# Patient Record
Sex: Female | Born: 1960 | Race: White | Hispanic: No | Marital: Married | Smoking: Former smoker
Health system: Southern US, Community
[De-identification: ages and names within clinical notes are randomized; demographics above are authoritative.]

## PROBLEM LIST (undated history)

## (undated) DIAGNOSIS — S82401A Unspecified fracture of shaft of right fibula, initial encounter for closed fracture: Secondary | ICD-10-CM

## (undated) DIAGNOSIS — R739 Hyperglycemia, unspecified: Secondary | ICD-10-CM

## (undated) DIAGNOSIS — E669 Obesity, unspecified: Secondary | ICD-10-CM

## (undated) DIAGNOSIS — G4730 Sleep apnea, unspecified: Secondary | ICD-10-CM

## (undated) DIAGNOSIS — J302 Other seasonal allergic rhinitis: Secondary | ICD-10-CM

## (undated) DIAGNOSIS — D219 Benign neoplasm of connective and other soft tissue, unspecified: Secondary | ICD-10-CM

## (undated) DIAGNOSIS — M1990 Unspecified osteoarthritis, unspecified site: Secondary | ICD-10-CM

## (undated) DIAGNOSIS — Z8709 Personal history of other diseases of the respiratory system: Secondary | ICD-10-CM

## (undated) DIAGNOSIS — M549 Dorsalgia, unspecified: Secondary | ICD-10-CM

## (undated) DIAGNOSIS — Z124 Encounter for screening for malignant neoplasm of cervix: Secondary | ICD-10-CM

## (undated) DIAGNOSIS — Z22322 Carrier or suspected carrier of Methicillin resistant Staphylococcus aureus: Secondary | ICD-10-CM

## (undated) DIAGNOSIS — M719 Bursopathy, unspecified: Secondary | ICD-10-CM

## (undated) DIAGNOSIS — N840 Polyp of corpus uteri: Secondary | ICD-10-CM

## (undated) DIAGNOSIS — F329 Major depressive disorder, single episode, unspecified: Secondary | ICD-10-CM

## (undated) DIAGNOSIS — M722 Plantar fascial fibromatosis: Secondary | ICD-10-CM

## (undated) DIAGNOSIS — K219 Gastro-esophageal reflux disease without esophagitis: Secondary | ICD-10-CM

## (undated) DIAGNOSIS — M791 Myalgia, unspecified site: Secondary | ICD-10-CM

## (undated) DIAGNOSIS — S62109A Fracture of unspecified carpal bone, unspecified wrist, initial encounter for closed fracture: Secondary | ICD-10-CM

## (undated) DIAGNOSIS — M2550 Pain in unspecified joint: Secondary | ICD-10-CM

## (undated) DIAGNOSIS — G4733 Obstructive sleep apnea (adult) (pediatric): Secondary | ICD-10-CM

## (undated) DIAGNOSIS — N6019 Diffuse cystic mastopathy of unspecified breast: Secondary | ICD-10-CM

## (undated) DIAGNOSIS — F32A Depression, unspecified: Secondary | ICD-10-CM

## (undated) DIAGNOSIS — E559 Vitamin D deficiency, unspecified: Secondary | ICD-10-CM

## (undated) DIAGNOSIS — M810 Age-related osteoporosis without current pathological fracture: Secondary | ICD-10-CM

## (undated) DIAGNOSIS — E7800 Pure hypercholesterolemia, unspecified: Secondary | ICD-10-CM

## (undated) DIAGNOSIS — F419 Anxiety disorder, unspecified: Secondary | ICD-10-CM

## (undated) DIAGNOSIS — I10 Essential (primary) hypertension: Secondary | ICD-10-CM

## (undated) HISTORY — DX: Age-related osteoporosis without current pathological fracture: M81.0

## (undated) HISTORY — DX: Benign neoplasm of connective and other soft tissue, unspecified: D21.9

## (undated) HISTORY — DX: Unspecified osteoarthritis, unspecified site: M19.90

## (undated) HISTORY — PX: FRACTURE SURGERY: SHX138

## (undated) HISTORY — DX: Personal history of other diseases of the respiratory system: Z87.09

## (undated) HISTORY — DX: Unspecified fracture of shaft of right fibula, initial encounter for closed fracture: S82.401A

## (undated) HISTORY — DX: Major depressive disorder, single episode, unspecified: F32.9

## (undated) HISTORY — DX: Obstructive sleep apnea (adult) (pediatric): G47.33

## (undated) HISTORY — DX: Bursopathy, unspecified: M71.9

## (undated) HISTORY — DX: Obesity, unspecified: E66.9

## (undated) HISTORY — DX: Myalgia, unspecified site: M79.10

## (undated) HISTORY — DX: Dorsalgia, unspecified: M54.9

## (undated) HISTORY — PX: ESSURE TUBAL LIGATION: SUR464

## (undated) HISTORY — DX: Essential (primary) hypertension: I10

## (undated) HISTORY — DX: Encounter for screening for malignant neoplasm of cervix: Z12.4

## (undated) HISTORY — DX: Fracture of unspecified carpal bone, unspecified wrist, initial encounter for closed fracture: S62.109A

## (undated) HISTORY — DX: Polyp of corpus uteri: N84.0

## (undated) HISTORY — DX: Other seasonal allergic rhinitis: J30.2

## (undated) HISTORY — DX: Plantar fascial fibromatosis: M72.2

## (undated) HISTORY — DX: Gastro-esophageal reflux disease without esophagitis: K21.9

## (undated) HISTORY — DX: Hyperglycemia, unspecified: R73.9

## (undated) HISTORY — DX: Pure hypercholesterolemia, unspecified: E78.00

## (undated) HISTORY — DX: Sleep apnea, unspecified: G47.30

## (undated) HISTORY — DX: Diffuse cystic mastopathy of unspecified breast: N60.19

## (undated) HISTORY — DX: Carrier or suspected carrier of methicillin resistant Staphylococcus aureus: Z22.322

## (undated) HISTORY — DX: Vitamin D deficiency, unspecified: E55.9

## (undated) HISTORY — DX: Pain in unspecified joint: M25.50

## (undated) HISTORY — DX: Depression, unspecified: F32.A

---
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
## 1986-03-01 DIAGNOSIS — N6019 Diffuse cystic mastopathy of unspecified breast: Secondary | ICD-10-CM

## 1986-03-01 HISTORY — DX: Diffuse cystic mastopathy of unspecified breast: N60.19

## 2000-03-01 DIAGNOSIS — E7800 Pure hypercholesterolemia, unspecified: Secondary | ICD-10-CM

## 2000-03-01 DIAGNOSIS — I10 Essential (primary) hypertension: Secondary | ICD-10-CM

## 2000-03-01 HISTORY — DX: Pure hypercholesterolemia, unspecified: E78.00

## 2000-03-01 HISTORY — DX: Essential (primary) hypertension: I10

## 2003-03-02 DIAGNOSIS — K219 Gastro-esophageal reflux disease without esophagitis: Secondary | ICD-10-CM

## 2003-03-02 HISTORY — DX: Gastro-esophageal reflux disease without esophagitis: K21.9

## 2005-03-01 DIAGNOSIS — Z22322 Carrier or suspected carrier of Methicillin resistant Staphylococcus aureus: Secondary | ICD-10-CM

## 2005-03-01 HISTORY — DX: Carrier or suspected carrier of methicillin resistant Staphylococcus aureus: Z22.322

## 2010-04-16 LAB — HM MAMMOGRAPHY: HM Mammogram: NORMAL

## 2010-06-26 ENCOUNTER — Ambulatory Visit (INDEPENDENT_AMBULATORY_CARE_PROVIDER_SITE_OTHER): Payer: PRIVATE HEALTH INSURANCE | Admitting: Internal Medicine

## 2010-06-26 ENCOUNTER — Encounter: Payer: Self-pay | Admitting: Internal Medicine

## 2010-06-26 DIAGNOSIS — E785 Hyperlipidemia, unspecified: Secondary | ICD-10-CM

## 2010-06-26 DIAGNOSIS — F329 Major depressive disorder, single episode, unspecified: Secondary | ICD-10-CM

## 2010-06-26 DIAGNOSIS — M791 Myalgia, unspecified site: Secondary | ICD-10-CM

## 2010-06-26 DIAGNOSIS — I10 Essential (primary) hypertension: Secondary | ICD-10-CM

## 2010-06-26 DIAGNOSIS — M2550 Pain in unspecified joint: Secondary | ICD-10-CM

## 2010-06-26 DIAGNOSIS — F32A Depression, unspecified: Secondary | ICD-10-CM

## 2010-06-26 DIAGNOSIS — Z22322 Carrier or suspected carrier of Methicillin resistant Staphylococcus aureus: Secondary | ICD-10-CM

## 2010-06-26 DIAGNOSIS — IMO0001 Reserved for inherently not codable concepts without codable children: Secondary | ICD-10-CM

## 2010-06-26 MED ORDER — BUPROPION HCL ER (SR) 150 MG PO TB12: 150.0000 mg | ORAL_TABLET | Freq: Two times a day (BID) | ORAL | Status: DC

## 2010-06-26 MED ORDER — BISOPROLOL-HYDROCHLOROTHIAZIDE 5-6.25 MG PO TABS: 2.0000 | ORAL_TABLET | Freq: Every day | ORAL | Status: DC

## 2010-06-26 MED ORDER — MUPIROCIN 2 % EX OINT: TOPICAL_OINTMENT | Freq: Three times a day (TID) | CUTANEOUS | Status: AC

## 2010-06-26 MED ORDER — OMEPRAZOLE MAGNESIUM 20 MG PO TBEC: 20.0000 mg | DELAYED_RELEASE_TABLET | Freq: Every day | ORAL | Status: DC

## 2010-06-26 MED ORDER — AMLODIPINE BESYLATE 5 MG PO TABS: 5.0000 mg | ORAL_TABLET | Freq: Every day | ORAL | Status: DC

## 2010-06-26 NOTE — Patient Instructions (Signed)
Keep food log to bring to your next office visit

## 2010-06-26 NOTE — Progress Notes (Signed)
Subjective:    Patient ID: Alejandra Miller, female    DOB: 10/08/1960, 50 y.o.   MRN: [id number]  HPI 50 y/o female to establish.    Hx of MRSA skin infections.  Uses abx ointment.  Hx of possible RA.  "Lab test was positive".  Hx of chronic hand pain since 1998.  She prev attributed to osteoarthritis Pt has been taking NSAIDs and tramadol daily x 6 months. (chronic hand, knee, shoulders, back pain).  Currently treated by  chiropractor.  Using TENs, roller, upper back and cervical spine adjustments.  Prev PCP ([facility] MD and  Chancy Hurter MD ([facility]).  Unhappy with prev PCP.  High cholesterol - always on lovastatin  Married 2 1/2 yrs.  No children.  Case mgr for developmental disability  Prev smoker.  Smoked x 8 yrs, quit 1991 1/2 ppd Seldom etoh occ smoke marijuana  Father - CAD , Htn, high cholesterol, DM II, CVA   Review of Systems  Constitutional: Negative for activity change, appetite change.  Pt frustrated with her weight Eyes: Negative for visual disturbance.  Respiratory: Negative for cough, chest tightness and shortness of breath.   Cardiovascular: Negative for chest pain.  Genitourinary: Negative for difficulty urinating.  Neurological: Negative for headaches.  Gastrointestinal: Negative for abdominal pain, heartburn melena or hematochezia Rheum:  Dry mouth, negative for hand joint deformity    Past Medical History  Diagnosis Date  . Rheumatoid arthritis   . Depression     counseling in 2010 for 2 months on medication  . GERD (gastroesophageal reflux disease) 2005    treated with omeprazole  . Seasonal allergies     affected in the spring  . Hypertension 2002  . High cholesterol 2002  . History of sinusitis     once/twice per year  . Fibrocystic breast 1988    History   Social History  . Marital Status: Married    Spouse Name: N/A    Number of Children: N/A  . Years of Education: N/A   Occupational History  . Not on file.    Social History Main Topics  . Smoking status: Former Games developer  . Smokeless tobacco: Not on file   Comment: Quit in 1991- started in 1983 1/2ppd  . Alcohol Use: Yes     once yearly  . Drug Use: Yes  . Sexually Active: Not on file   Other Topics Concern  . Not on file   Social History Narrative  . No narrative on file    Past Surgical History  Procedure Date  . Tonsillectomy 1969    Family History  Problem Relation Age of Onset  . Osteoarthritis Mother   . Liver cancer Maternal Grandmother   . Hyperlipidemia Mother   . Hyperlipidemia Father   . Coronary artery disease Father   . Hypertension Father   . Stroke Father     multiple  . Depression Mother   . Diabetes Father     No Known Allergies  No current outpatient prescriptions on file prior to visit.    BP 134/100  Pulse 77  Temp(Src) 98 F (36.7 C) (Oral)  Resp 22  Ht 5' 3.5" (1.613 m)  Wt 267 lb (121.11 kg)  BMI 46.55 kg/m2  SpO2 97%    Objective:   Physical Exam    Constitutional: Appears well-developed and well-nourished. No distress.  Head: Normocephalic and atraumatic.  Right Ear: External ear normal.  Left Ear: External ear normal.  Mouth/Throat: Oropharynx is  clear and moist.  Eyes: Conjunctivae are normal. Pupils are equal, round, and reactive to light.  Neck: Normal range of motion. Neck supple. No thyromegaly present. No carotid bruit Cardiovascular: Normal rate, regular rhythm and normal heart sounds.  Exam reveals no gallop and no friction rub.   Pulmonary/Chest: Effort normal and breath sounds normal.  No wheezes. No rales.  Abdominal: Soft. Bowel sounds are normal. No mass. There is no tenderness.  Neurological: Alert. No cranial nerve deficit.  Skin: Skin is warm and dry.  Psychiatric: Normal mood and affect. Behavior is normal.    Assessment & Plan:

## 2010-06-29 ENCOUNTER — Telehealth: Payer: Self-pay | Admitting: Internal Medicine

## 2010-06-29 MED ORDER — OMEPRAZOLE 20 MG PO CPDR: 20.0000 mg | DELAYED_RELEASE_CAPSULE | Freq: Every day | ORAL | Status: DC

## 2010-06-29 NOTE — Telephone Encounter (Signed)
Ok to change to capsules 

## 2010-06-29 NOTE — Telephone Encounter (Signed)
rx changed to capsules and sent to pharmacy

## 2010-06-29 NOTE — Telephone Encounter (Signed)
Pharmacy comments:  otc tab not covered, can we use rx capsules?  Omoprazole(prilosec otc) 20mg  tablet. Take 1 tablet(20mg  total) by mouth daily. Qty 30 tablet. Date written 4.27.12

## 2010-07-01 LAB — BASIC METABOLIC PANEL WITH GFR
CO2: 25 mEq/L (ref 19–32)
Calcium: 9.1 mg/dL (ref 8.4–10.5)
Creat: 0.84 mg/dL (ref 0.40–1.20)
Glucose, Bld: 88 mg/dL (ref 70–99)

## 2010-07-01 LAB — LIPID PANEL
Cholesterol: 226 mg/dL — ABNORMAL HIGH (ref 0–200)
VLDL: 34 mg/dL (ref 0–40)

## 2010-07-01 LAB — HEPATIC FUNCTION PANEL
ALT: 18 U/L (ref 0–35)
AST: 17 U/L (ref 0–37)
Albumin: 4.1 g/dL (ref 3.5–5.2)
Alkaline Phosphatase: 62 U/L (ref 39–117)

## 2010-07-01 LAB — SEDIMENTATION RATE: Sed Rate: 5 mm/hr (ref 0–22)

## 2010-07-01 LAB — TSH: TSH: 1.169 u[IU]/mL (ref 0.350–4.500)

## 2010-07-03 LAB — VITAMIN D 1,25 DIHYDROXY: Vitamin D 1, 25 (OH)2 Total: 36 pg/mL (ref 18–72)

## 2010-07-24 ENCOUNTER — Ambulatory Visit: Payer: PRIVATE HEALTH INSURANCE | Admitting: Internal Medicine

## 2010-07-28 DIAGNOSIS — F339 Major depressive disorder, recurrent, unspecified: Secondary | ICD-10-CM | POA: Insufficient documentation

## 2010-07-28 DIAGNOSIS — I10 Essential (primary) hypertension: Secondary | ICD-10-CM | POA: Insufficient documentation

## 2010-07-28 DIAGNOSIS — E782 Mixed hyperlipidemia: Secondary | ICD-10-CM | POA: Insufficient documentation

## 2010-07-28 DIAGNOSIS — F32A Depression, unspecified: Secondary | ICD-10-CM | POA: Insufficient documentation

## 2010-07-28 DIAGNOSIS — M2550 Pain in unspecified joint: Secondary | ICD-10-CM | POA: Insufficient documentation

## 2010-07-28 DIAGNOSIS — F329 Major depressive disorder, single episode, unspecified: Secondary | ICD-10-CM | POA: Insufficient documentation

## 2010-07-28 DIAGNOSIS — E785 Hyperlipidemia, unspecified: Secondary | ICD-10-CM | POA: Insufficient documentation

## 2010-07-28 NOTE — Assessment & Plan Note (Signed)
Change lovastatin to pravastatin due to potential interaction with amlodipine.

## 2010-07-28 NOTE — Assessment & Plan Note (Signed)
Pt with chronic htn.  Use selective b block considering hx of depression.  Continue amlodipine.  Consider higher dose if persistent diastolic elevation.  Pt encouraged to monitor her BP at home  BP: 134/100 mmHg   Obtain BMET

## 2010-07-28 NOTE — Assessment & Plan Note (Signed)
Appears stable.  Continue current dose of bupropion 150 mg bid.

## 2010-07-28 NOTE — Assessment & Plan Note (Signed)
I doubt inflammatory arthritis.  Check sed rate.  Continue NSAIDs.  Pt advised to use PPI regularly for gastric protection.  Monitor kidney function.

## 2010-07-31 ENCOUNTER — Ambulatory Visit: Payer: PRIVATE HEALTH INSURANCE | Admitting: Internal Medicine

## 2010-08-10 ENCOUNTER — Ambulatory Visit: Payer: PRIVATE HEALTH INSURANCE | Admitting: Internal Medicine

## 2010-08-11 ENCOUNTER — Encounter: Payer: Self-pay | Admitting: Family Medicine

## 2010-08-11 ENCOUNTER — Ambulatory Visit (INDEPENDENT_AMBULATORY_CARE_PROVIDER_SITE_OTHER): Payer: PRIVATE HEALTH INSURANCE | Admitting: Family Medicine

## 2010-08-11 VITALS — BP 122/72 | HR 74 | Temp 98.5°F | Resp 20 | Wt 262.0 lb

## 2010-08-11 DIAGNOSIS — G4733 Obstructive sleep apnea (adult) (pediatric): Secondary | ICD-10-CM

## 2010-08-11 DIAGNOSIS — E785 Hyperlipidemia, unspecified: Secondary | ICD-10-CM

## 2010-08-11 DIAGNOSIS — I10 Essential (primary) hypertension: Secondary | ICD-10-CM

## 2010-08-11 DIAGNOSIS — F329 Major depressive disorder, single episode, unspecified: Secondary | ICD-10-CM

## 2010-08-11 DIAGNOSIS — F32A Depression, unspecified: Secondary | ICD-10-CM

## 2010-08-11 DIAGNOSIS — M2550 Pain in unspecified joint: Secondary | ICD-10-CM

## 2010-08-11 NOTE — Assessment & Plan Note (Signed)
Lovastatin caused myalgias. She wants to do a full 83mo trial off of cholest meds altogether (has been off about 37mo already), recheck lipids in 20mo and if not improved then will either start pravastatin + coQ10 OR trial of welchol or zetia.

## 2010-08-11 NOTE — Assessment & Plan Note (Signed)
Adjustment d/o lately.  We'll give it a bit more time since the struggles of moving her home lately have been the main life incident that has provoked this, and her move will be complete soon.  If still feeling the same in 32mo she'll call or return for consideration of adding SSRI to her wellbutrin.

## 2010-08-11 NOTE — Progress Notes (Signed)
OFFICE NOTE  08/11/2010  CC:  Chief Complaint  Patient presents with  . Hyperlipidemia    would like to take something for this  . Hypertension  . Sleeping Problem    increase in snoring would  like to be evaluated for sleep apnea     HPI:   Patient is a 50 y.o. Caucasian female who is here for f/u HTN and hyperlipidemia, also for snoring. No home bp checks to report but feels better in a nonspecific way since bp med change last visit. Also says muscle aching that she was having has stopped since d/c of lovastatin.  She has been on no cholest med for a couple of months now. Complains of worsening loud snoring, with apneic periods and some "gasping" type breathing noted by her husband during sleep.  Mild EDS -epworth score 7 today.  +FH of OSA.   She went to rheum 08/03/10 and they told her they don't think she has rheum arth, some x-rays of hands were done and some labs were done and results have not been communicated to her yet. She's been feeling more emotional lately, easily overwhelmed and feels like she doesn't cope well, for about 32mo now but worse the last 2 wks b/c of having to move--getting into some spats with her husband.  Pertinent PMH:  HTN Hyperlipidemia Arthralgias--likely osteoarthritis Depression  MEDS;   Outpatient Prescriptions Prior to Visit  Medication Sig Dispense Refill  . amLODipine (NORVASC) 5 MG tablet Take 1 tablet (5 mg total) by mouth daily.  30 tablet  2  . bisoprolol-hydrochlorothiazide (ZIAC) 5-6.25 MG per tablet Take 2 tablets by mouth daily.  60 tablet  1  . buPROPion (WELLBUTRIN SR) 150 MG 12 hr tablet Take 1 tablet (150 mg total) by mouth 2 (two) times daily.  60 tablet  2  . cyclobenzaprine (FLEXERIL) 10 MG tablet Take 10 mg by mouth daily as needed.        . Multiple Vitamin (MULTIVITAMIN) tablet Take 1 tablet by mouth daily.        . naproxen sodium (ANAPROX) 220 MG tablet Take 220 mg by mouth 2 (two) times daily with a meal.        . Omega-3  Fatty Acids (FISH OIL) 1200 MG CAPS Take 1,200 mg by mouth daily.        Marland Kitchen omeprazole (PRILOSEC) 20 MG capsule Take 1 capsule (20 mg total) by mouth daily.  30 capsule  5  . traMADol (ULTRAM) 50 MG tablet Take 50 mg by mouth 2 (two) times daily.          PE: Blood pressure 122/72, pulse 74, temperature 98.5 F (36.9 C), temperature source Oral, resp. rate 20, weight 262 lb (118.842 kg), SpO2 97.00%. Gen: Alert, well appearing.  Patient is oriented to person, place, time, and situation.  Obese-appearing. Chest: symmetric expansion, nonlabored respirations.  Clear and equal breath sounds in all lung fields.   CV: RRR, no m/r/g.  Peripheral pulses 2+ and symmetric.   IMPRESSION AND PLAN:  OSA (obstructive sleep apnea) Will order sleep study at [facility] sleep lab.  Hypertension Problem stable.  Continue current medications and diet appropriate for this condition.  We have reviewed our general long term plan for this problem and also reviewed symptoms and signs that should prompt the patient to call or return to the office.   Chemistry      Component Value Date/Time   NA 139 06/26/2010 1441   K 4.2 06/26/2010 1441  CL 103 06/26/2010 1441   CO2 25 06/26/2010 1441   BUN 17 06/26/2010 1441   CREATININE 0.84 06/26/2010 1441      Component Value Date/Time   CALCIUM 9.1 06/26/2010 1441   ALKPHOS 62 06/26/2010 1441   AST 17 06/26/2010 1441   ALT 18 06/26/2010 1441   BILITOT 0.5 06/26/2010 1441       Hyperlipidemia Lovastatin caused myalgias. She wants to do a full 15mo trial off of cholest meds altogether (has been off about 522mo already), recheck lipids in 22mo and if not improved then will either start pravastatin + coQ10 OR trial of welchol or zetia.  Depression Adjustment d/o lately.  We'll give it a bit more time since the struggles of moving her home lately have been the main life incident that has provoked this, and her move will be complete soon.  If still feeling the same in 82mo she'll call  or return for consideration of adding SSRI to her wellbutrin.  Arthralgia With positive Rh factor. Recently saw rheum, awaiting records but pt reports that their initial impression was that she did not have rheum arth. Continue NSAIDs with daily PPI.     FOLLOW UP:  Return in about 4 months (around 12/11/2010) for recheck hyperlipidemia,HTN, EDS.

## 2010-08-11 NOTE — Assessment & Plan Note (Signed)
Problem stable.  Continue current medications and diet appropriate for this condition.  We have reviewed our general long term plan for this problem and also reviewed symptoms and signs that should prompt the patient to call or return to the office.   Chemistry      Component Value Date/Time   NA 139 06/26/2010 1441   K 4.2 06/26/2010 1441   CL 103 06/26/2010 1441   CO2 25 06/26/2010 1441   BUN 17 06/26/2010 1441   CREATININE 0.84 06/26/2010 1441      Component Value Date/Time   CALCIUM 9.1 06/26/2010 1441   ALKPHOS 62 06/26/2010 1441   AST 17 06/26/2010 1441   ALT 18 06/26/2010 1441   BILITOT 0.5 06/26/2010 1441

## 2010-08-11 NOTE — Assessment & Plan Note (Signed)
With positive Rh factor. Recently saw rheum, awaiting records but pt reports that their initial impression was that she did not have rheum arth. Continue NSAIDs with daily PPI.

## 2010-08-11 NOTE — Assessment & Plan Note (Signed)
Will order sleep study at [facility] sleep lab.

## 2010-09-01 ENCOUNTER — Ambulatory Visit (HOSPITAL_BASED_OUTPATIENT_CLINIC_OR_DEPARTMENT_OTHER): Payer: PRIVATE HEALTH INSURANCE | Attending: Family Medicine

## 2010-09-01 DIAGNOSIS — R404 Transient alteration of awareness: Secondary | ICD-10-CM | POA: Insufficient documentation

## 2010-09-01 DIAGNOSIS — G4733 Obstructive sleep apnea (adult) (pediatric): Secondary | ICD-10-CM | POA: Insufficient documentation

## 2010-09-01 DIAGNOSIS — R0989 Other specified symptoms and signs involving the circulatory and respiratory systems: Secondary | ICD-10-CM | POA: Insufficient documentation

## 2010-09-01 DIAGNOSIS — R0609 Other forms of dyspnea: Secondary | ICD-10-CM | POA: Insufficient documentation

## 2010-09-01 DIAGNOSIS — R5383 Other fatigue: Secondary | ICD-10-CM | POA: Insufficient documentation

## 2010-09-01 DIAGNOSIS — G4761 Periodic limb movement disorder: Secondary | ICD-10-CM | POA: Insufficient documentation

## 2010-09-01 DIAGNOSIS — R5381 Other malaise: Secondary | ICD-10-CM | POA: Insufficient documentation

## 2010-09-07 ENCOUNTER — Telehealth: Payer: Self-pay | Admitting: Internal Medicine

## 2010-09-07 NOTE — Telephone Encounter (Signed)
Pt would like to know sleep study results

## 2010-09-08 NOTE — Telephone Encounter (Signed)
No, haven't seen it--PM

## 2010-09-08 NOTE — Telephone Encounter (Signed)
Pt had sleep study on 09/01/10 and wants to know if we have results.  Advised pt it may take 2-3 weeks for results.  She is agreeable.  advised pt we would call her when we get results.   Dr. Milinda Cave, have you seen a hardcopy?

## 2010-09-09 NOTE — Telephone Encounter (Signed)
Pt notified by voicemail per DPR that hardcopy has not come in.  We will call her when we have results.  Advised pt that if she has not heard from [name] by 09/21/10 to call us back.

## 2010-09-15 DIAGNOSIS — G4733 Obstructive sleep apnea (adult) (pediatric): Secondary | ICD-10-CM

## 2010-09-15 DIAGNOSIS — R0989 Other specified symptoms and signs involving the circulatory and respiratory systems: Secondary | ICD-10-CM

## 2010-09-15 DIAGNOSIS — R404 Transient alteration of awareness: Secondary | ICD-10-CM

## 2010-09-15 DIAGNOSIS — R5381 Other malaise: Secondary | ICD-10-CM

## 2010-09-15 DIAGNOSIS — R5383 Other fatigue: Secondary | ICD-10-CM

## 2010-09-15 DIAGNOSIS — G4761 Periodic limb movement disorder: Secondary | ICD-10-CM

## 2010-09-15 DIAGNOSIS — R0609 Other forms of dyspnea: Secondary | ICD-10-CM

## 2010-09-16 NOTE — Procedures (Signed)
NAME:  Alejandra Miller, Alejandra Miller NO.:  [id number]  MEDICAL RECORD NO.:  [id number]          PATIENT TYPE:  OUT  LOCATION:  SLEEP CENTER                 FACILITY[facility]  [facility]  PHYSICIAN:  Oretha Milch, MD      DATE OF BIRTH:  [birth year]  DATE OF STUDY:  09/01/2010                           NOCTURNAL POLYSOMNOGRAM  REFERRING PHYSICIAN:  PHILIP H MCGOWEN  INDICATION FOR STUDY:  Excessive daytime fatigue and somnolence, loud snoring and witnessed apneas with nocturia in this 50 year old woman. At the time of this study, she weighed 257 pounds with a height of 5 feet 4 inches, BMI of 41, neck size of 15 inches.  EPWORTH SLEEPINESS SCORE:  5.  MEDICATIONS:  Bedtime medications none.  This nocturnal polysomnogram was performed with a sleep technologist in attendance.  EEG, EOG, EMG, EKG and respiratory parameters were recorded.  Sleep stages arousals, limb movements, respiratory data were scored according to criteria laid out by the American Academy of Sleep Medicine.  SLEEP ARCHITECTURE:  Lights out was at 10:24 p.m.  Lights on was at 5:13 a.m.  Total sleep time was 326 minutes with a sleep period time of 398 minutes and sleep efficiency of 80%.  Sleep latency was 11 minutes. Latency to REM sleep was 258 minutes.  Awake after sleep onset was 72 minutes.  Sleep stages of the percentage of total sleep time was N1 11%, N2 55%, N3 19% and REM sleep 16% (51 minutes).  Supine sleep accounted for 48 minutes.  REM sleep was seen in 2 stages at 3:00 a.m. and 5:00 a.m.  AROUSAL DATA:  There were total of 193 arousals with an arousal index of 36 events per hour.  Of these, 100 were spontaneous and most of the rest was associated with respiratory events.  RESPIRATORY DATA:  There were total of 83 obstructive apneas, 19 central apneas, 10 mixed apneas, 22 hypopneas with an apnea-hypopnea index of 25 events per hour.  There were 39 RERAs with an RDI of 32 events per hour. The REM AHI was  100 events per hour and the non-REM AHI was 10 events per hour.  Longest apnea was 38 seconds.  Longest hypopnea was 30 seconds.  LIMB MOVEMENT DATA:  The limb movement index was 14 events per hour. Limb movement arousal index was 3 events per hour.  OXYGEN DATA:  The desaturation index was 18 events per hour.  She spent 1 minute with saturation less than 88%.  Lowest desaturation was 83% during REM sleep.  CARDIAC DATA:  No arrhythmias were noted.  The low heart rate was 30 beats per minute.  The high heart rate recorded was an artifact.  PARASOMNIA:  none noted  DISCUSSION - Most events were noted during REM sleep.  She did not meet criteria for an emergency intervention.  IMPRESSIONS-RECOMMENDATIONS: 1. Moderate obstructive sleep apnea with hypopneas causing sleep     fragmentation and oxygen desaturation.  These were predominantly     noted during REM sleep regardless of body position. 2. Few periodic limb movements were noted associated with arousal. 3. No evidence of cardiac arrhythmias or behavioral disturbance during  sleep.   RECOMMENDATIONS - 1. The treatment options for this degree of sleep disordered breathing     include weight loss and CPAP therapy. A formal CPAP titration can     be scheduled in the lab or alternatively another CPAP titration can     be performed at home given absence of comorbidity. 2. She should be advised against driving when sleepy. 3. She should be cautioned against medication with sedative side     effects.     Oretha Milch, MD Electronically Signed    RVA/MEDQ  D:  09/15/2010 12:37:40  T:  09/16/2010 01:24:19  Job:  [id number]  cc:   Jeoffrey Massed, MD Fax: [phone]

## 2010-09-17 ENCOUNTER — Other Ambulatory Visit: Payer: Self-pay | Admitting: Internal Medicine

## 2010-09-17 MED ORDER — CYCLOBENZAPRINE HCL 10 MG PO TABS: 10.0000 mg | ORAL_TABLET | Freq: Every day | ORAL | Status: DC | PRN

## 2010-09-17 MED ORDER — TRAMADOL HCL 50 MG PO TABS: 50.0000 mg | ORAL_TABLET | Freq: Two times a day (BID) | ORAL | Status: DC

## 2010-09-17 NOTE — Telephone Encounter (Signed)
Pt also requested sleep study results. Left message on sleep lab voicemail to fax result or call me tomorrow after 8am.

## 2010-09-17 NOTE — Telephone Encounter (Signed)
Pt last seen in June, has follow up in October. How many refills may pt get on tramadol and cyclobenzaprine?

## 2010-09-17 NOTE — Telephone Encounter (Signed)
2 rf 

## 2010-09-17 NOTE — Telephone Encounter (Signed)
Refill-tramadol 50mg  tab. Take one tablet by mouth every 6 to 8 hours as needed. Qty 90. Last fill 2.17.12  Pharmacy comments: no refills available.  Refill- cyclobenzaprine hcl 10mg  tab. Take one tablet every morning and one tablet at night as needed. Qty 30. Last fill 2.17.12  Pharmacy comments: no refills available.

## 2010-09-18 NOTE — Telephone Encounter (Signed)
Pt has been notified that we will call her with sleep study results once we receive them.

## 2010-09-22 ENCOUNTER — Other Ambulatory Visit: Payer: Self-pay | Admitting: Internal Medicine

## 2010-09-22 NOTE — Telephone Encounter (Signed)
No further action needed at this time.

## 2010-09-22 NOTE — Telephone Encounter (Signed)
Call placed to [facility], spoke with Gladys Damme she states rx sent on 09/17/2010 did not include the bisoprolol hct. Rx refill sent to pharmacy

## 2010-09-25 ENCOUNTER — Other Ambulatory Visit: Payer: Self-pay | Admitting: Family Medicine

## 2010-09-25 ENCOUNTER — Encounter: Payer: Self-pay | Admitting: Family Medicine

## 2010-09-25 ENCOUNTER — Telehealth: Payer: Self-pay | Admitting: Family Medicine

## 2010-09-25 NOTE — Telephone Encounter (Signed)
Reviewed options with patient.  She would prefer to have auto titration at home.  Advised we would place order and for pt to call us if she has not heard from [name] by the end of next week.  Pt agreeable.

## 2010-09-25 NOTE — Telephone Encounter (Signed)
Please notify patient that her sleep study did show significant obstructive sleep apnea and she needs to start wearing CPAP. She could get the titration at [facility] sleep lab OR she can get this done at her home.  See which she prefers. ---PM

## 2010-09-29 ENCOUNTER — Ambulatory Visit (INDEPENDENT_AMBULATORY_CARE_PROVIDER_SITE_OTHER): Payer: PRIVATE HEALTH INSURANCE | Admitting: Internal Medicine

## 2010-09-29 ENCOUNTER — Encounter: Payer: Self-pay | Admitting: Internal Medicine

## 2010-09-29 DIAGNOSIS — L089 Local infection of the skin and subcutaneous tissue, unspecified: Secondary | ICD-10-CM

## 2010-09-29 DIAGNOSIS — S80869A Insect bite (nonvenomous), unspecified lower leg, initial encounter: Secondary | ICD-10-CM

## 2010-09-29 DIAGNOSIS — IMO0002 Reserved for concepts with insufficient information to code with codable children: Secondary | ICD-10-CM

## 2010-09-29 MED ORDER — CEPHALEXIN 500 MG PO CAPS: 500.0000 mg | ORAL_CAPSULE | Freq: Three times a day (TID) | ORAL | Status: AC

## 2010-09-29 NOTE — Assessment & Plan Note (Signed)
Concern for mild cellulitis. Begin keflex to completion. Monitor area and followup if no improvement or worsening

## 2010-09-29 NOTE — Progress Notes (Signed)
  Subjective:    Patient ID: Alejandra Miller, female    DOB: 03/26/1960, 50 y.o.   MRN: [id number]  HPI Pt presents to clinic for evaluation of possible spider bite. Approximately 4-5 days ago suffered bite not visualized to right popliteal area. Initially pain moderate for first few days now improved. Area has remained red without discharge or drainage. No spread of redness and no decrease of leg rom. Attempting topical benzocaine for comfort prn. No other alleviating or exacerbating factors. No other complaints.  Reviewed pmh, medications and allergies    Review of Systems  Constitutional: Negative for fever and chills.  Musculoskeletal: Negative for joint swelling and arthralgias.       Objective:   Physical Exam  Nursing note and vitals reviewed. Constitutional: She appears well-developed and well-nourished. No distress.  HENT:  Head: Normocephalic and atraumatic.  Eyes: Conjunctivae are normal. No scleral icterus.  Neurological: She is alert.  Skin: Skin is warm and dry. No rash noted. She is not diaphoretic. There is erythema. No pallor.       Right popliteal area with 2cm area of redness. Slightly tender. No drainage or expressible pus. Slight warmth  Psychiatric: She has a normal mood and affect.          Assessment & Plan:

## 2010-10-01 ENCOUNTER — Other Ambulatory Visit: Payer: Self-pay | Admitting: Family Medicine

## 2010-10-01 DIAGNOSIS — G4733 Obstructive sleep apnea (adult) (pediatric): Secondary | ICD-10-CM

## 2010-10-01 NOTE — Telephone Encounter (Signed)
Order for auto-titration of CPAP put in today via [employer].--PM

## 2010-10-06 NOTE — Telephone Encounter (Signed)
Alejandra Miller need to be closed?

## 2010-10-13 ENCOUNTER — Encounter: Payer: Self-pay | Admitting: Family Medicine

## 2010-10-19 ENCOUNTER — Other Ambulatory Visit: Payer: Self-pay | Admitting: Internal Medicine

## 2010-10-19 NOTE — Telephone Encounter (Signed)
Rx refill sent to pharmacy. 

## 2010-12-11 ENCOUNTER — Ambulatory Visit (INDEPENDENT_AMBULATORY_CARE_PROVIDER_SITE_OTHER): Payer: PRIVATE HEALTH INSURANCE | Admitting: Internal Medicine

## 2010-12-11 ENCOUNTER — Ambulatory Visit: Payer: PRIVATE HEALTH INSURANCE | Admitting: Internal Medicine

## 2010-12-11 ENCOUNTER — Encounter: Payer: Self-pay | Admitting: Internal Medicine

## 2010-12-11 DIAGNOSIS — G4733 Obstructive sleep apnea (adult) (pediatric): Secondary | ICD-10-CM

## 2010-12-11 DIAGNOSIS — E559 Vitamin D deficiency, unspecified: Secondary | ICD-10-CM

## 2010-12-11 DIAGNOSIS — N926 Irregular menstruation, unspecified: Secondary | ICD-10-CM

## 2010-12-11 DIAGNOSIS — Z1239 Encounter for other screening for malignant neoplasm of breast: Secondary | ICD-10-CM

## 2010-12-11 DIAGNOSIS — I10 Essential (primary) hypertension: Secondary | ICD-10-CM

## 2010-12-11 DIAGNOSIS — E785 Hyperlipidemia, unspecified: Secondary | ICD-10-CM

## 2010-12-11 MED ORDER — LORAZEPAM 1 MG PO TABS: ORAL_TABLET | ORAL | Status: DC

## 2010-12-11 MED ORDER — ATORVASTATIN CALCIUM 20 MG PO TABS: 20.0000 mg | ORAL_TABLET | Freq: Every day | ORAL | Status: DC

## 2010-12-11 NOTE — Progress Notes (Signed)
  Subjective:    Patient ID: CAROLEE CHANNELL, female    DOB: 1961/01/05, 50 y.o.   MRN: [id number]  HPI Pt presents to clinic for followup of multiple medical problems. Has associated anxiety adjusting to use of cpap. Notes menstraul irregularity starting several months ago. Periods have not decreased or slowed in frequency. Intermittent menorrhagia noted by pt. H/o hyperlipidemia with myalgias while taking lovastatin. Reviewed past vitamin d deficiency not s/p vit d 1000 units qd. No other complaints.   Past Medical History  Diagnosis Date  . Rheumatoid arthritis   . Depression     counseling in 2010 for 2 months on medication  . GERD (gastroesophageal reflux disease) 2005    treated with omeprazole  . Seasonal allergies     affected in the spring  . Hypertension 2002  . High cholesterol 2002  . History of sinusitis     once/twice per year  . Fibrocystic breast 1988  . OSA (obstructive sleep apnea)     +sleep study 08/2010.   Past Surgical History  Procedure Date  . Tonsillectomy 1969    reports that she has quit smoking. She does not have any smokeless tobacco history on file. She reports that she drinks alcohol. She reports that she uses illicit drugs. family history includes Coronary artery disease in her father; Depression in her mother; Diabetes in her father; Hyperlipidemia in her father and mother; Hypertension in her father; Liver cancer in her maternal grandmother; Osteoarthritis in her mother; and Stroke in her father. No Known Allergies   Review of Systems see hpi    Objective:   Physical Exam  Physical Exam  Nursing note and vitals reviewed. Constitutional: Appears well-developed and well-nourished. No distress.  HENT:  Head: Normocephalic and atraumatic.  Right Ear: External ear normal.  Left Ear: External ear normal.  Eyes: Conjunctivae are normal. No scleral icterus.  Neck: Neck supple. Carotid bruit is not present.  Cardiovascular: Normal rate, regular rhythm  and normal heart sounds.  Exam reveals no gallop and no friction rub.   No murmur heard. Pulmonary/Chest: Effort normal and breath sounds normal. No respiratory distress. He has no wheezes. no rales.  Lymphadenopathy:    He has no cervical adenopathy.  Neurological:Alert.  Skin: Skin is warm and dry. Not diaphoretic.  Psychiatric: Has a normal mood and affect.        Assessment & Plan:

## 2010-12-11 NOTE — Patient Instructions (Addendum)
Please schedule screening mammogram. Also please schedule fasting labs: lipid, lft (272.4) chem7 (v58.69) and vitamin D (vitamin d deficiency) in approximately 4-5 weeks Please schedule lipid/lft (272.4) and chem7 (v58.69) prior to next office visit

## 2010-12-15 DIAGNOSIS — N926 Irregular menstruation, unspecified: Secondary | ICD-10-CM | POA: Insufficient documentation

## 2010-12-15 DIAGNOSIS — E559 Vitamin D deficiency, unspecified: Secondary | ICD-10-CM | POA: Insufficient documentation

## 2010-12-15 NOTE — Assessment & Plan Note (Signed)
Intolerant of lovastatin. Attempt lipitor. Obtain lipid/lft in ~4-6 wks.

## 2010-12-15 NOTE — Assessment & Plan Note (Signed)
Borderline control. Discussed/recommended regular exercise and wt loss

## 2010-12-15 NOTE — Assessment & Plan Note (Signed)
Associated anxiety adjusting to cpap. Short term prn ativan qhs prn. Discouraged long term use. Aware of potential for addiction and/or tolerance.

## 2010-12-15 NOTE — Assessment & Plan Note (Signed)
Obtain vitamin d level 

## 2010-12-15 NOTE — Assessment & Plan Note (Signed)
Request gyn consult

## 2011-01-05 ENCOUNTER — Telehealth: Payer: Self-pay | Admitting: Internal Medicine

## 2011-01-05 NOTE — Telephone Encounter (Signed)
Refill lorazepam 1mg  tablet qty 30 take 1 at bedtime as needed last fill 12-11-2010

## 2011-01-06 NOTE — Telephone Encounter (Signed)
Yes with rf1

## 2011-01-06 NOTE — Telephone Encounter (Signed)
Call placed to pharmacy at [phone], spoke with [facility] she stated patient requested refill. Rx for October was picked up on 12/12/2010, so patient is not due for refill.   Call placed to patient at [phone], no answer. A voice message was left for patient to return phone call regarding refill request.

## 2011-01-06 NOTE — Telephone Encounter (Signed)
Shouldn't be out?

## 2011-01-06 NOTE — Telephone Encounter (Signed)
Is it okay to provide refill on Lorazepam for this patient?

## 2011-01-06 NOTE — Telephone Encounter (Signed)
Patient returned phone call. She stated that she still has about 5 pills remaining. She stated that she was afraid that she might run out. She would like to know if Dr Rodena Medin would approve refills. She was advised to contact pharmacy for refill when she is out and have them contact our office. Patient has verbalized understanding and agrees. Patient is due refill around the 12/13th of November. Would it be okay to refill medication at that time?

## 2011-01-12 ENCOUNTER — Other Ambulatory Visit: Payer: Self-pay | Admitting: *Deleted

## 2011-01-12 MED ORDER — LORAZEPAM 1 MG PO TABS: ORAL_TABLET | ORAL | Status: AC

## 2011-01-12 NOTE — Telephone Encounter (Signed)
Rx called to pharmacy to pharmacist.

## 2011-02-01 ENCOUNTER — Other Ambulatory Visit: Payer: Self-pay | Admitting: Internal Medicine

## 2011-02-02 NOTE — Telephone Encounter (Signed)
Rx refill sent to pharmacy. 

## 2011-02-10 ENCOUNTER — Ambulatory Visit (HOSPITAL_BASED_OUTPATIENT_CLINIC_OR_DEPARTMENT_OTHER)
Admission: RE | Admit: 2011-02-10 | Discharge: 2011-02-10 | Disposition: A | Discharge status: Home / Self Care | Payer: PRIVATE HEALTH INSURANCE | Source: Ambulatory Visit | Attending: Internal Medicine | Admitting: Internal Medicine

## 2011-02-10 ENCOUNTER — Other Ambulatory Visit: Payer: Self-pay | Admitting: Internal Medicine

## 2011-02-10 DIAGNOSIS — E785 Hyperlipidemia, unspecified: Secondary | ICD-10-CM

## 2011-02-10 DIAGNOSIS — Z1239 Encounter for other screening for malignant neoplasm of breast: Secondary | ICD-10-CM

## 2011-02-10 DIAGNOSIS — Z1231 Encounter for screening mammogram for malignant neoplasm of breast: Secondary | ICD-10-CM

## 2011-02-10 DIAGNOSIS — Z79899 Other long term (current) drug therapy: Secondary | ICD-10-CM

## 2011-02-10 LAB — BASIC METABOLIC PANEL
BUN: 14 mg/dL (ref 6–23)
CO2: 27 mEq/L (ref 19–32)
Calcium: 9.6 mg/dL (ref 8.4–10.5)
Creat: 0.86 mg/dL (ref 0.50–1.10)
Glucose, Bld: 88 mg/dL (ref 70–99)
Sodium: 139 mEq/L (ref 135–145)

## 2011-02-10 LAB — HEPATIC FUNCTION PANEL
ALT: 21 U/L (ref 0–35)
AST: 20 U/L (ref 0–37)
Alkaline Phosphatase: 65 U/L (ref 39–117)
Bilirubin, Direct: 0.1 mg/dL (ref 0.0–0.3)
Indirect Bilirubin: 0.3 mg/dL (ref 0.0–0.9)
Total Bilirubin: 0.4 mg/dL (ref 0.3–1.2)

## 2011-02-10 LAB — LIPID PANEL
Cholesterol: 143 mg/dL (ref 0–200)
Total CHOL/HDL Ratio: 4.2 Ratio

## 2011-02-10 LAB — VITAMIN D 25 HYDROXY (VIT D DEFICIENCY, FRACTURES): Vit D, 25-Hydroxy: 49 ng/mL (ref 30–89)

## 2011-03-09 ENCOUNTER — Telehealth: Payer: Self-pay | Admitting: *Deleted

## 2011-03-09 ENCOUNTER — Other Ambulatory Visit: Payer: Self-pay | Admitting: Internal Medicine

## 2011-03-09 NOTE — Telephone Encounter (Signed)
Call placed to patient at [phone], no answer. A detailed voice message was left informing patient per Dr Rodena Medin instructions. She was advised to call back if any questions.

## 2011-03-09 NOTE — Telephone Encounter (Signed)
Her prescription is for 1mg  qhs. Have her reduce to 1/2 tablet qhs for 10 days then 1/2 tablet mon, wed, fri for 2wks then 1/2 tablet tues, thurs for 2wks then off

## 2011-03-09 NOTE — Telephone Encounter (Signed)
Verbal refill provided to pharmacist at [facility] [phone].

## 2011-03-09 NOTE — Telephone Encounter (Signed)
Patient called and left voice message stating she is taking the Lorazepam for anxiety at night related to her CPAP machine. She states that she is trying to taper off of the medication and find that she still has trouble sleeping.  She would like to know if she could try to decrease the dose she is taking and would like to know how she should taper without experiencing side effects.

## 2011-03-10 ENCOUNTER — Telehealth: Payer: Self-pay | Admitting: Internal Medicine

## 2011-03-10 MED ORDER — AMLODIPINE BESYLATE 5 MG PO TABS: 5.0000 mg | ORAL_TABLET | Freq: Every day | ORAL | Status: DC

## 2011-03-10 MED ORDER — BUPROPION HCL ER (SR) 150 MG PO TB12: 150.0000 mg | ORAL_TABLET | Freq: Two times a day (BID) | ORAL | Status: DC

## 2011-03-10 NOTE — Telephone Encounter (Signed)
Refill- amlodipine besylate 5mg  tab. Take one tablet by mouth once daily. Qty 30 last fill 12.3.12  Refill- bupropion sr 150mg  tablet. Take one tablet by mouth twice a day. Qty 30 last fill 12.3.12

## 2011-03-10 NOTE — Telephone Encounter (Signed)
Rx refills sent to pharmacy. 

## 2011-03-10 NOTE — Telephone Encounter (Signed)
Call placed to patient at [phone], she stated that she did receive the message regarding the medication taper and there are no additional concerns.

## 2011-03-19 ENCOUNTER — Other Ambulatory Visit: Payer: Self-pay | Admitting: Internal Medicine

## 2011-05-13 MED ORDER — CYCLOBENZAPRINE HCL 10 MG PO TABS: 10.0000 mg | ORAL_TABLET | Freq: Every day | ORAL | Status: DC | PRN

## 2011-05-13 NOTE — Progress Notes (Signed)
Addended by: Glendell Docker on: 05/13/2011 10:16 AM   Modules accepted: Orders

## 2011-06-11 ENCOUNTER — Ambulatory Visit: Payer: PRIVATE HEALTH INSURANCE | Admitting: Internal Medicine

## 2011-07-10 ENCOUNTER — Other Ambulatory Visit: Payer: Self-pay | Admitting: Internal Medicine

## 2011-07-12 NOTE — Telephone Encounter (Signed)
Rx refill sent to pharmacy. 

## 2011-07-19 LAB — LIPID PANEL
LDL Cholesterol: 78 mg/dL (ref 0–99)
VLDL: 33 mg/dL (ref 0–40)

## 2011-07-19 NOTE — Progress Notes (Signed)
Addended by: Mervin Kung A on: 07/19/2011 08:45 AM   Modules accepted: Orders

## 2011-07-19 NOTE — Progress Notes (Signed)
Pt presented to the lab, future orders released. 

## 2011-07-20 LAB — HEPATIC FUNCTION PANEL
Bilirubin, Direct: 0.1 mg/dL (ref 0.0–0.3)
Indirect Bilirubin: 0.2 mg/dL (ref 0.0–0.9)
Total Bilirubin: 0.3 mg/dL (ref 0.3–1.2)
Total Protein: 6 g/dL (ref 6.0–8.3)

## 2011-07-20 LAB — BASIC METABOLIC PANEL
BUN: 14 mg/dL (ref 6–23)
Chloride: 105 mEq/L (ref 96–112)
Glucose, Bld: 101 mg/dL — ABNORMAL HIGH (ref 70–99)
Potassium: 4.7 mEq/L (ref 3.5–5.3)

## 2011-07-23 ENCOUNTER — Encounter: Payer: Self-pay | Admitting: Internal Medicine

## 2011-07-23 ENCOUNTER — Telehealth: Payer: Self-pay | Admitting: Internal Medicine

## 2011-07-23 ENCOUNTER — Ambulatory Visit (INDEPENDENT_AMBULATORY_CARE_PROVIDER_SITE_OTHER): Payer: Managed Care, Other (non HMO) | Admitting: Internal Medicine

## 2011-07-23 VITALS — BP 110/80 | HR 70 | Temp 97.9°F | Resp 20 | Ht 63.5 in | Wt 253.0 lb

## 2011-07-23 DIAGNOSIS — I10 Essential (primary) hypertension: Secondary | ICD-10-CM

## 2011-07-23 DIAGNOSIS — Z0000 Encounter for general adult medical examination without abnormal findings: Secondary | ICD-10-CM

## 2011-07-23 DIAGNOSIS — Z79899 Other long term (current) drug therapy: Secondary | ICD-10-CM

## 2011-07-23 DIAGNOSIS — E785 Hyperlipidemia, unspecified: Secondary | ICD-10-CM

## 2011-07-23 MED ORDER — LORAZEPAM 1 MG PO TABS: 0.5000 mg | ORAL_TABLET | Freq: Every evening | ORAL | Status: DC | PRN

## 2011-07-23 NOTE — Patient Instructions (Signed)
Please schedule fasting labs prior to next visit Cbc-401.9, chem7-v58.69, lipid/lft-272.4 

## 2011-07-23 NOTE — Telephone Encounter (Signed)
Lab order entered for October 2013. 

## 2011-07-28 DIAGNOSIS — Z0000 Encounter for general adult medical examination without abnormal findings: Secondary | ICD-10-CM | POA: Insufficient documentation

## 2011-07-28 NOTE — Assessment & Plan Note (Signed)
Nl exam. Encouraged dietary modification, exercise and wt loss. Resume ativan low dose.

## 2011-07-28 NOTE — Progress Notes (Signed)
  Subjective:    Patient ID: Alejandra Miller, female    DOB: 1960/06/24, 51 y.o.   MRN: [id number]  HPI Pt presents to clinic for annual physical. Cannot tolerate cpap for osa without ativan. Recently tapered off. Wt down 8lbs since last visit.  Past Medical History  Diagnosis Date  . Rheumatoid arthritis   . Depression     counseling in 2010 for 2 months on medication  . GERD (gastroesophageal reflux disease) 2005    treated with omeprazole  . Seasonal allergies     affected in the spring  . Hypertension 2002  . High cholesterol 2002  . History of sinusitis     once/twice per year  . Fibrocystic breast 1988  . OSA (obstructive sleep apnea)     +sleep study 08/2010.   Past Surgical History  Procedure Date  . Tonsillectomy 1969    reports that she has quit smoking. She has never used smokeless tobacco. She reports that she drinks alcohol. She reports that she uses illicit drugs. family history includes Coronary artery disease in her father; Depression in her mother; Diabetes in her father; Hyperlipidemia in her father and mother; Hypertension in her father; Liver cancer in her maternal grandmother; Osteoarthritis in her mother; and Stroke in her father. No Known Allergies   Review of Systems see hpi     Objective:   Physical Exam  Nursing note and vitals reviewed. Constitutional: She appears well-developed and well-nourished. No distress.  HENT:  Head: Normocephalic and atraumatic.  Right Ear: Tympanic membrane, external ear and ear canal normal.  Left Ear: Tympanic membrane, external ear and ear canal normal.  Nose: Nose normal.  Mouth/Throat: Oropharynx is clear and moist. No oropharyngeal exudate.  Eyes: Conjunctivae and EOM are normal. Pupils are equal, round, and reactive to light. No scleral icterus.  Neck: Neck supple. No thyromegaly present.  Cardiovascular: Normal rate, regular rhythm and normal heart sounds.  Exam reveals no gallop and no friction rub.   No murmur  heard. Pulmonary/Chest: Effort normal and breath sounds normal. No respiratory distress. She has no wheezes. She has no rales.  Abdominal: Soft. Bowel sounds are normal. She exhibits no distension and no mass. There is no tenderness. There is no rebound and no guarding.  Lymphadenopathy:    She has no cervical adenopathy.  Neurological: She is alert.  Skin: Skin is warm and dry. She is not diaphoretic.  Psychiatric: She has a normal mood and affect.          Assessment & Plan:

## 2011-07-30 ENCOUNTER — Other Ambulatory Visit: Payer: Self-pay | Admitting: Internal Medicine

## 2011-08-02 NOTE — Telephone Encounter (Signed)
Please advise if ok to give refills on requested meds and if so how many refills.

## 2011-08-03 NOTE — Telephone Encounter (Signed)
ziac [id number]. Others rf3

## 2011-08-05 NOTE — Telephone Encounter (Signed)
Refills sent to pharmacy. 

## 2011-09-12 ENCOUNTER — Other Ambulatory Visit: Payer: Self-pay | Admitting: Internal Medicine

## 2011-10-17 ENCOUNTER — Other Ambulatory Visit: Payer: Self-pay | Admitting: Internal Medicine

## 2011-10-18 NOTE — Telephone Encounter (Signed)
Wellbutrin request [last refill 01.09.13 #[id number] Last OV 05.24.13]/SLS Please advise.

## 2011-11-05 ENCOUNTER — Encounter: Payer: Self-pay | Admitting: Family

## 2011-11-05 ENCOUNTER — Ambulatory Visit (INDEPENDENT_AMBULATORY_CARE_PROVIDER_SITE_OTHER): Payer: Managed Care, Other (non HMO) | Admitting: Family

## 2011-11-05 VITALS — BP 124/86 | HR 55 | Temp 98.2°F | Resp 16 | Wt 250.1 lb

## 2011-11-05 DIAGNOSIS — J329 Chronic sinusitis, unspecified: Secondary | ICD-10-CM

## 2011-11-05 MED ORDER — AMOXICILLIN-POT CLAVULANATE 875-125 MG PO TABS: 1.0000 | ORAL_TABLET | Freq: Two times a day (BID) | ORAL | Status: AC

## 2011-11-05 NOTE — Patient Instructions (Addendum)

## 2011-11-05 NOTE — Assessment & Plan Note (Signed)
Will plan rx with augmentin.

## 2011-11-05 NOTE — Progress Notes (Signed)
Subjective:    Patient ID: Alejandra Miller, female    DOB: Jul 16, 1960, 51 y.o.   MRN: [id number]  HPI  Alejandra Miller is a 51 yr old female who presents today with chief complaint of sinus congestion. She reports that her congestion is associated with sinus drainage x 1 week.  She also reports associated headache x 2 weeks and now has productive cough with yellow/green phlegm. She also reports having some mild diarrhea x 4 days:  1-2 bms/day.  She denies associated fever.  Multiple otc preps with temp relief.    Review of Systems See HPI  Past Medical History  Diagnosis Date  . Rheumatoid arthritis   . Depression     counseling in 2010 for 2 months on medication  . GERD (gastroesophageal reflux disease) 2005    treated with omeprazole  . Seasonal allergies     affected in the spring  . Hypertension 2002  . High cholesterol 2002  . History of sinusitis     once/twice per year  . Fibrocystic breast 1988  . OSA (obstructive sleep apnea)     +sleep study 08/2010.    History   Social History  . Marital Status: Married    Spouse Name: N/A    Number of Children: N/A  . Years of Education: N/A   Occupational History  . Not on file.   Social History Main Topics  . Smoking status: Former Games developer  . Smokeless tobacco: Never Used   Comment: Quit in 1991- started in 1983 1/2ppd  . Alcohol Use: Yes     once yearly  . Drug Use: Yes  . Sexually Active: Not on file   Other Topics Concern  . Not on file   Social History Narrative  . No narrative on file    Past Surgical History  Procedure Date  . Tonsillectomy 1969    Family History  Problem Relation Age of Onset  . Osteoarthritis Mother   . Liver cancer Maternal Grandmother   . Hyperlipidemia Mother   . Hyperlipidemia Father   . Coronary artery disease Father   . Hypertension Father   . Stroke Father     multiple  . Depression Mother   . Diabetes Father     No Known Allergies  Current Outpatient Prescriptions on File  Prior to Visit  Medication Sig Dispense Refill  . amLODipine (NORVASC) 5 MG tablet take 1 tablet by mouth daily  30 tablet  6  . atorvastatin (LIPITOR) 20 MG tablet take 1 tablet by mouth once daily  30 tablet  3  . bisoprolol-hydrochlorothiazide (ZIAC) 5-6.25 MG per tablet take 2 tablets by mouth once daily  60 tablet  6  . buPROPion (WELLBUTRIN SR) 150 MG 12 hr tablet take 1 tablet by mouth twice a day  60 tablet  6  . Cholecalciferol (VITAMIN D3) 1000 UNITS CAPS Take 1 capsule by mouth daily.        . cyclobenzaprine (FLEXERIL) 10 MG tablet take 1 tablet by mouth daily if needed  30 tablet  3  . Multiple Vitamin (MULTIVITAMIN) tablet Take 1 tablet by mouth daily.        . naproxen sodium (ANAPROX) 220 MG tablet Take 440 mg by mouth 2 (two) times daily with a meal.       . Omega-3 Fatty Acids (FISH OIL) 1200 MG CAPS Take 1,200 mg by mouth daily.        Marland Kitchen omeprazole (PRILOSEC) 20 MG capsule Take 1  capsule (20 mg total) by mouth daily.  30 capsule  5  . LORazepam (ATIVAN) 1 MG tablet Take 0.5 tablets (0.5 mg total) by mouth at bedtime as needed for anxiety.  30 tablet  1    BP 124/86  Pulse 55  Temp 98.2 F (36.8 C) (Oral)  Resp 16  Wt 250 lb 1.3 oz (113.436 kg)  SpO2 99%       Objective:   Physical Exam  Constitutional: She appears well-developed and well-nourished.  HENT:  Head: Normocephalic and atraumatic.  Mouth/Throat: No oropharyngeal exudate or posterior oropharyngeal edema.       Some clear fluid noted behind TM's. Mild frontal and maxillary sinus tenderness to palpation.  Cardiovascular: Normal rate and regular rhythm.   Pulmonary/Chest: Effort normal and breath sounds normal.  Lymphadenopathy:    She has no cervical adenopathy.  Skin: Skin is warm and dry.  Psychiatric: She has a normal mood and affect. Her behavior is normal. Judgment and thought content normal.          Assessment & Plan:

## 2011-11-29 ENCOUNTER — Encounter: Payer: Self-pay | Admitting: Internal Medicine

## 2011-11-29 ENCOUNTER — Ambulatory Visit (INDEPENDENT_AMBULATORY_CARE_PROVIDER_SITE_OTHER): Payer: Managed Care, Other (non HMO) | Admitting: Internal Medicine

## 2011-11-29 ENCOUNTER — Ambulatory Visit (HOSPITAL_BASED_OUTPATIENT_CLINIC_OR_DEPARTMENT_OTHER)
Admission: RE | Admit: 2011-11-29 | Discharge: 2011-11-29 | Disposition: A | Discharge status: Home / Self Care | Payer: Managed Care, Other (non HMO) | Source: Ambulatory Visit | Attending: Internal Medicine | Admitting: Internal Medicine

## 2011-11-29 VITALS — BP 118/84 | HR 77 | Temp 98.2°F | Resp 18 | Wt 253.0 lb

## 2011-11-29 DIAGNOSIS — M79604 Pain in right leg: Secondary | ICD-10-CM | POA: Insufficient documentation

## 2011-11-29 DIAGNOSIS — M79609 Pain in unspecified limb: Secondary | ICD-10-CM | POA: Insufficient documentation

## 2011-11-29 MED ORDER — TRAMADOL HCL 50 MG PO TABS: 50.0000 mg | ORAL_TABLET | Freq: Four times a day (QID) | ORAL | Status: DC | PRN

## 2011-11-29 NOTE — Assessment & Plan Note (Signed)
Obtain plain xray of right tib/fib. otc nsaid prn. Provide ultram prn. Followup if no improvement or worsening.

## 2011-11-29 NOTE — Progress Notes (Signed)
  Subjective:    Patient ID: Alejandra Miller, female    DOB: 05-31-1960, 51 y.o.   MRN: [id number]  HPI Pt presents to clinic for evaluation of leg pain. Larey Seat this morning tripping on a baby gate. Struck her right anterior mid tibia and notes soft tissue swelling, bruising and pain/tenderness. No knee involvement. Is using ice. Takes aleve bid. No other alleviating or exacerbating factors.  Past Medical History  Diagnosis Date  . Rheumatoid arthritis   . Depression     counseling in 2010 for 2 months on medication  . GERD (gastroesophageal reflux disease) 2005    treated with omeprazole  . Seasonal allergies     affected in the spring  . Hypertension 2002  . High cholesterol 2002  . History of sinusitis     once/twice per year  . Fibrocystic breast 1988  . OSA (obstructive sleep apnea)     +sleep study 08/2010.   Past Surgical History  Procedure Date  . Tonsillectomy 1969    reports that she has quit smoking. She has never used smokeless tobacco. She reports that she drinks alcohol. She reports that she uses illicit drugs. family history includes Coronary artery disease in her father; Depression in her mother; Diabetes in her father; Hyperlipidemia in her father and mother; Hypertension in her father; Liver cancer in her maternal grandmother; Osteoarthritis in her mother; and Stroke in her father. No Known Allergies   Review of Systems see hpi     Objective:   Physical Exam  Nursing note and vitals reviewed. Constitutional: She appears well-developed and well-nourished. No distress.  HENT:  Head: Normocephalic and atraumatic.  Musculoskeletal:       Right knee -no erythema, warmth or effusion.  Right ant tibia-+soft tissue swelling, +ecchymosis and tenderness.  Able to wt bear and ambulate without assistance.  Neurological: She is alert.  Skin: Skin is warm and dry. She is not diaphoretic.          Assessment & Plan:

## 2011-12-06 ENCOUNTER — Ambulatory Visit (INDEPENDENT_AMBULATORY_CARE_PROVIDER_SITE_OTHER): Payer: Managed Care, Other (non HMO) | Admitting: Internal Medicine

## 2011-12-06 ENCOUNTER — Ambulatory Visit (HOSPITAL_BASED_OUTPATIENT_CLINIC_OR_DEPARTMENT_OTHER)
Admission: RE | Admit: 2011-12-06 | Discharge: 2011-12-06 | Disposition: A | Discharge status: Home / Self Care | Payer: Managed Care, Other (non HMO) | Source: Ambulatory Visit | Attending: Internal Medicine | Admitting: Internal Medicine

## 2011-12-06 ENCOUNTER — Encounter: Payer: Self-pay | Admitting: Internal Medicine

## 2011-12-06 VITALS — BP 128/72 | HR 66 | Temp 98.4°F | Wt 255.0 lb

## 2011-12-06 DIAGNOSIS — M7989 Other specified soft tissue disorders: Secondary | ICD-10-CM

## 2011-12-06 DIAGNOSIS — M79609 Pain in unspecified limb: Secondary | ICD-10-CM | POA: Insufficient documentation

## 2011-12-06 DIAGNOSIS — Z23 Encounter for immunization: Secondary | ICD-10-CM

## 2011-12-08 DIAGNOSIS — M7989 Other specified soft tissue disorders: Secondary | ICD-10-CM | POA: Insufficient documentation

## 2011-12-08 NOTE — Assessment & Plan Note (Signed)
Obtain lower chin ultrasound rule out DVT. If negative suspect swelling from hematoma and blood within the tissue planes

## 2011-12-08 NOTE — Progress Notes (Signed)
  Subjective:    Patient ID: Alejandra Miller, female    DOB: [birth year], 51 y.o.   MRN: [id number]  HPI patient presents to clinic for evaluation of right leg swelling. Recently seen after injury with right lower extremity pain. No fracture noted on x-ray. Presents today with significant ecchymosis involving the majority of the right lower leg. There soft tissue prominence at the site of injury however there is also more diffuse swelling of the entire calf. No chest pain or shortness of breath. Someone told her she may have a blood clot because of the swelling.  Past Medical History  Diagnosis Date  . Rheumatoid arthritis   . Depression     counseling in 2010 for 2 months on medication  . GERD (gastroesophageal reflux disease) 2005    treated with omeprazole  . Seasonal allergies     affected in the spring  . Hypertension 2002  . High cholesterol 2002  . History of sinusitis     once/twice per year  . Fibrocystic breast 1988  . OSA (obstructive sleep apnea)     +sleep study 08/2010.   Past Surgical History  Procedure Date  . Tonsillectomy 1969    reports that she has quit smoking. She has never used smokeless tobacco. She reports that she drinks alcohol. She reports that she uses illicit drugs. family history includes Coronary artery disease in her father; Depression in her mother; Diabetes in her father; Hyperlipidemia in her father and mother; Hypertension in her father; Liver cancer in her maternal grandmother; Osteoarthritis in her mother; and Stroke in her father. No Known Allergies   Review of Systems see hpi     Objective:   Physical Exam  Nursing note and vitals reviewed. Constitutional: She appears well-developed and well-nourished. No distress.  HENT:  Head: Normocephalic.  Musculoskeletal:       Right lower extremity: Diffuse ecchymosis. No wound ulceration or drainage. Soft tissue prominence anterior tibia at the site of injury likely representing hematoma. There is  circumferential calf swelling however. No palpable cords  Neurological: She is alert.  Skin: Skin is warm and dry. She is not diaphoretic.  Psychiatric: She has a normal mood and affect.          Assessment & Plan:

## 2011-12-17 ENCOUNTER — Telehealth: Payer: Self-pay | Admitting: *Deleted

## 2011-12-17 NOTE — Telephone Encounter (Signed)
Pt called requesting a letter or a note from Dr. Rodena Medin recommending a weight loss program for a "specific condition". She is joining [employer] and she found out that her employer will re-imburse the cost of the program if her physician recommended a program dated before 12/17/2011.

## 2011-12-20 ENCOUNTER — Encounter: Payer: Self-pay | Admitting: Internal Medicine

## 2011-12-20 NOTE — Telephone Encounter (Signed)
Letter printed.

## 2011-12-20 NOTE — Telephone Encounter (Signed)
LMOM with contact name & number to inform pt letter requested ready for p/u Mon-Fri 8a-5p/SLS

## 2011-12-24 ENCOUNTER — Ambulatory Visit: Payer: Managed Care, Other (non HMO) | Admitting: Internal Medicine

## 2012-01-26 ENCOUNTER — Ambulatory Visit: Payer: Managed Care, Other (non HMO) | Admitting: Internal Medicine

## 2012-01-28 ENCOUNTER — Ambulatory Visit: Payer: Managed Care, Other (non HMO) | Admitting: Internal Medicine

## 2012-01-31 ENCOUNTER — Other Ambulatory Visit: Payer: Self-pay | Admitting: Internal Medicine

## 2012-02-01 NOTE — Telephone Encounter (Signed)
Rx to pharmacy/SLS 

## 2012-02-04 LAB — LIPID PANEL
Cholesterol: 150 mg/dL (ref 0–200)
HDL: 34 mg/dL — ABNORMAL LOW (ref >39)
Total CHOL/HDL Ratio: 4.4 Ratio
Triglycerides: 116 mg/dL (ref <150)

## 2012-02-04 LAB — HEPATIC FUNCTION PANEL
ALT: 23 U/L (ref 0–35)
AST: 24 U/L (ref 0–37)
Albumin: 3.9 g/dL (ref 3.5–5.2)
Alkaline Phosphatase: 64 U/L (ref 39–117)
Total Protein: 6.2 g/dL (ref 6.0–8.3)

## 2012-02-04 LAB — BASIC METABOLIC PANEL
CO2: 30 mEq/L (ref 19–32)
Calcium: 9.6 mg/dL (ref 8.4–10.5)
Creat: 0.87 mg/dL (ref 0.50–1.10)
Sodium: 141 mEq/L (ref 135–145)

## 2012-02-04 LAB — CBC
MCH: 28.1 pg (ref 26.0–34.0)
MCV: 84.2 fL (ref 78.0–100.0)
Platelets: 328 10*3/uL (ref 150–400)
RBC: 4.67 MIL/uL (ref 3.87–5.11)
RDW: 13.4 % (ref 11.5–15.5)

## 2012-02-04 NOTE — Addendum Note (Signed)
Addended by: Regis Bill on: 02/04/2012 09:17 AM   Modules accepted: Orders

## 2012-02-04 NOTE — Telephone Encounter (Signed)
Lab Orders released/SLS

## 2012-02-11 ENCOUNTER — Ambulatory Visit (INDEPENDENT_AMBULATORY_CARE_PROVIDER_SITE_OTHER): Payer: Managed Care, Other (non HMO) | Admitting: Internal Medicine

## 2012-02-11 ENCOUNTER — Encounter: Payer: Self-pay | Admitting: Internal Medicine

## 2012-02-11 VITALS — BP 122/86 | HR 56 | Temp 97.8°F | Resp 16 | Wt 244.0 lb

## 2012-02-11 DIAGNOSIS — I10 Essential (primary) hypertension: Secondary | ICD-10-CM

## 2012-02-11 DIAGNOSIS — J329 Chronic sinusitis, unspecified: Secondary | ICD-10-CM

## 2012-02-11 DIAGNOSIS — E669 Obesity, unspecified: Secondary | ICD-10-CM

## 2012-02-11 MED ORDER — FLUTICASONE PROPIONATE 50 MCG/ACT NA SUSP: 2.0000 | Freq: Every day | NASAL | Status: DC

## 2012-02-11 NOTE — Progress Notes (Signed)
  Subjective:    Patient ID: Alejandra Miller, female    DOB: Sep 29, 1960, 51 y.o.   MRN: [id number]  HPI Pt presents to clinic for followup of multiple medical problems. Weight down 11lbs since last visit-attempting weight watchers. C/o recent frontal ha and nasal congestion. No f/c and does not feel sick. H/o osa and cannot tolerate cpap mask. Is considering seeing specialist about possible oral appliance.   Past Medical History  Diagnosis Date  . Rheumatoid arthritis   . Depression     counseling in 2010 for 2 months on medication  . GERD (gastroesophageal reflux disease) 2005    treated with omeprazole  . Seasonal allergies     affected in the spring  . Hypertension 2002  . High cholesterol 2002  . History of sinusitis     once/twice per year  . Fibrocystic breast 1988  . OSA (obstructive sleep apnea)     +sleep study 08/2010.   Past Surgical History  Procedure Date  . Tonsillectomy 1969    reports that she has quit smoking. She has never used smokeless tobacco. She reports that she drinks alcohol. She reports that she uses illicit drugs. family history includes Coronary artery disease in her father; Depression in her mother; Diabetes in her father; Hyperlipidemia in her father and mother; Hypertension in her father; Liver cancer in her maternal grandmother; Osteoarthritis in her mother; and Stroke in her father. No Known Allergies    Review of Systems see hpi     Objective:   Physical Exam  Physical Exam  Nursing note and vitals reviewed. Constitutional: Appears well-developed and well-nourished. No distress.  HENT:  Head: Normocephalic and atraumatic.  Right Ear: External ear normal.  Left Ear: External ear normal.  Eyes: Conjunctivae are normal. No scleral icterus.  Neck: Neck supple. Carotid bruit is not present.  Cardiovascular: Normal rate, regular rhythm and normal heart sounds.  Exam reveals no gallop and no friction rub.   No murmur heard. Pulmonary/Chest: Effort  normal and breath sounds normal. No respiratory distress. He has no wheezes. no rales.  Lymphadenopathy:    He has no cervical adenopathy.  Neurological:Alert.  Skin: Skin is warm and dry. Not diaphoretic.  Psychiatric: Has a normal mood and affect.        Assessment & Plan:

## 2012-02-12 DIAGNOSIS — J329 Chronic sinusitis, unspecified: Secondary | ICD-10-CM | POA: Insufficient documentation

## 2012-02-12 DIAGNOSIS — Z6841 Body Mass Index (BMI) 40.0 and over, adult: Secondary | ICD-10-CM | POA: Insufficient documentation

## 2012-02-12 NOTE — Assessment & Plan Note (Signed)
Attempt flonase. Followup if no improvement or worsening.

## 2012-02-12 NOTE — Assessment & Plan Note (Signed)
Improving. Continue weight watchers. Begin regular exercise.

## 2012-02-12 NOTE — Assessment & Plan Note (Signed)
Normotensive and stable. Continue current regimen. Monitor bp as outpt and followup in clinic as scheduled.  

## 2012-03-01 DIAGNOSIS — D219 Benign neoplasm of connective and other soft tissue, unspecified: Secondary | ICD-10-CM

## 2012-03-01 HISTORY — DX: Benign neoplasm of connective and other soft tissue, unspecified: D21.9

## 2012-03-14 ENCOUNTER — Ambulatory Visit: Payer: Managed Care, Other (non HMO) | Admitting: Internal Medicine

## 2012-03-14 ENCOUNTER — Ambulatory Visit (INDEPENDENT_AMBULATORY_CARE_PROVIDER_SITE_OTHER): Payer: Managed Care, Other (non HMO) | Admitting: Internal Medicine

## 2012-03-14 ENCOUNTER — Encounter: Payer: Self-pay | Admitting: Internal Medicine

## 2012-03-14 VITALS — BP 124/82 | HR 66 | Temp 98.2°F | Resp 16 | Wt 247.0 lb

## 2012-03-14 DIAGNOSIS — F3289 Other specified depressive episodes: Secondary | ICD-10-CM

## 2012-03-14 DIAGNOSIS — F329 Major depressive disorder, single episode, unspecified: Secondary | ICD-10-CM

## 2012-03-14 DIAGNOSIS — F32A Depression, unspecified: Secondary | ICD-10-CM

## 2012-03-14 MED ORDER — CITALOPRAM HYDROBROMIDE 20 MG PO TABS: 20.0000 mg | ORAL_TABLET | Freq: Every day | ORAL | Status: DC

## 2012-03-23 NOTE — Progress Notes (Signed)
  Subjective:    Patient ID: Alejandra Miller, female    DOB: 1960/12/07, 52 y.o.   MRN: [id number]  HPI Pt presents to clinic for evaluation of depression. Notes decreased mood without SI or HI. Taking wellbutrin without improvement. S/p zoloft and prozac in the past and failed. No other complaint. Total time of visit ~22 minutes of which >50% of time spent in counseling.  Past Medical History  Diagnosis Date  . Rheumatoid arthritis   . Depression     counseling in 2010 for 2 months on medication  . GERD (gastroesophageal reflux disease) 2005    treated with omeprazole  . Seasonal allergies     affected in the spring  . Hypertension 2002  . High cholesterol 2002  . History of sinusitis     once/twice per year  . Fibrocystic breast 1988  . OSA (obstructive sleep apnea)     +sleep study 08/2010.   Past Surgical History  Procedure Date  . Tonsillectomy 1969    reports that she has quit smoking. She has never used smokeless tobacco. She reports that she drinks alcohol. She reports that she uses illicit drugs. family history includes Coronary artery disease in her father; Depression in her mother; Diabetes in her father; Hyperlipidemia in her father and mother; Hypertension in her father; Liver cancer in her maternal grandmother; Osteoarthritis in her mother; and Stroke in her father. No Known Allergies   Review of Systems see hpi     Objective:   Physical Exam  Nursing note and vitals reviewed. HENT:  Head: Normocephalic and atraumatic.  Psychiatric: She has a normal mood and affect. Her behavior is normal.          Assessment & Plan:

## 2012-03-23 NOTE — Assessment & Plan Note (Signed)
Begin celexa and schedule therapist referral. Schedule close follow up.

## 2012-04-07 ENCOUNTER — Other Ambulatory Visit: Payer: Self-pay | Admitting: Internal Medicine

## 2012-04-10 NOTE — Telephone Encounter (Signed)
Please advise Ativan RX. Last RX was wrote on 01-12-11 quantity 30 with 1 refill.  If ok fax to [phone]

## 2012-04-11 MED ORDER — LORAZEPAM 1 MG PO TABS: 0.5000 mg | ORAL_TABLET | Freq: Every evening | ORAL | Status: DC | PRN

## 2012-04-11 NOTE — Addendum Note (Signed)
Addended by: Court Joy on: 04/11/2012 08:23 AM   Modules accepted: Orders

## 2012-04-13 ENCOUNTER — Ambulatory Visit: Payer: Managed Care, Other (non HMO) | Admitting: Family Medicine

## 2012-04-14 ENCOUNTER — Ambulatory Visit: Payer: Managed Care, Other (non HMO) | Admitting: Family

## 2012-04-18 ENCOUNTER — Ambulatory Visit (INDEPENDENT_AMBULATORY_CARE_PROVIDER_SITE_OTHER): Payer: Managed Care, Other (non HMO) | Admitting: Family Medicine

## 2012-04-18 ENCOUNTER — Encounter: Payer: Self-pay | Admitting: Family Medicine

## 2012-04-18 VITALS — BP 126/84 | HR 64 | Temp 97.9°F | Ht 63.5 in | Wt 249.1 lb

## 2012-04-18 DIAGNOSIS — G4733 Obstructive sleep apnea (adult) (pediatric): Secondary | ICD-10-CM

## 2012-04-18 DIAGNOSIS — I10 Essential (primary) hypertension: Secondary | ICD-10-CM

## 2012-04-18 DIAGNOSIS — J329 Chronic sinusitis, unspecified: Secondary | ICD-10-CM

## 2012-04-18 DIAGNOSIS — F32A Depression, unspecified: Secondary | ICD-10-CM

## 2012-04-18 DIAGNOSIS — F329 Major depressive disorder, single episode, unspecified: Secondary | ICD-10-CM

## 2012-04-18 MED ORDER — SULFAMETHOXAZOLE-TRIMETHOPRIM 800-160 MG PO TABS: 1.0000 | ORAL_TABLET | Freq: Two times a day (BID) | ORAL | Status: DC

## 2012-04-18 NOTE — Assessment & Plan Note (Signed)
Has an appt with dentist to discuss oral appliance she had trouble tolerating CPAP. She is encouraged to continue trying to treat her sleep apnea, we will consider adding meds to help her sleep with CPAP use if the dental appliance is not helpful

## 2012-04-18 NOTE — Assessment & Plan Note (Signed)
Improving with increasing Citalopram, will reevalaute in 10 weeks or sooner as needed.

## 2012-04-18 NOTE — Assessment & Plan Note (Signed)
Well controlled, no changes 

## 2012-04-18 NOTE — Patient Instructions (Addendum)
Mucinex 600 mg twice a day with abx, increase hydration. Probiotic such as Digestive Advantage, ALign Sinusitis Sinusitis is redness, soreness, and swelling (inflammation) of the paranasal sinuses. Paranasal sinuses are air pockets within the bones of your face (beneath the eyes, the middle of the forehead, or above the eyes). In healthy paranasal sinuses, mucus is able to drain out, and air is able to circulate through them by way of your nose. However, when your paranasal sinuses are inflamed, mucus and air can become trapped. This can allow bacteria and other germs to grow and cause infection. Sinusitis can develop quickly and last only a short time (acute) or continue over a long period (chronic). Sinusitis that lasts for more than 12 weeks is considered chronic.  CAUSES  Causes of sinusitis include:  Allergies.  Structural abnormalities, such as displacement of the cartilage that separates your nostrils (deviated septum), which can decrease the air flow through your nose and sinuses and affect sinus drainage.  Functional abnormalities, such as when the small hairs (cilia) that line your sinuses and help remove mucus do not work properly or are not present. SYMPTOMS  Symptoms of acute and chronic sinusitis are the same. The primary symptoms are pain and pressure around the affected sinuses. Other symptoms include:  Upper toothache.  Earache.  Headache.  Bad breath.  Decreased sense of smell and taste.  A cough, which worsens when you are lying flat.  Fatigue.  Fever.  Thick drainage from your nose, which often is green and may contain pus (purulent).  Swelling and warmth over the affected sinuses. DIAGNOSIS  Your caregiver will perform a physical exam. During the exam, your caregiver may:  Look in your nose for signs of abnormal growths in your nostrils (nasal polyps).  Tap over the affected sinus to check for signs of infection.  View the inside of your sinuses  (endoscopy) with a special imaging device with a light attached (endoscope), which is inserted into your sinuses. If your caregiver suspects that you have chronic sinusitis, one or more of the following tests may be recommended:  Allergy tests.  Nasal culture A sample of mucus is taken from your nose and sent to a lab and screened for bacteria.  Nasal cytology A sample of mucus is taken from your nose and examined by your caregiver to determine if your sinusitis is related to an allergy. TREATMENT  Most cases of acute sinusitis are related to a viral infection and will resolve on their own within 10 days. Sometimes medicines are prescribed to help relieve symptoms (pain medicine, decongestants, nasal steroid sprays, or saline sprays).  However, for sinusitis related to a bacterial infection, your caregiver will prescribe antibiotic medicines. These are medicines that will help kill the bacteria causing the infection.  Rarely, sinusitis is caused by a fungal infection. In theses cases, your caregiver will prescribe antifungal medicine. For some cases of chronic sinusitis, surgery is needed. Generally, these are cases in which sinusitis recurs more than 3 times per year, despite other treatments. HOME CARE INSTRUCTIONS   Drink plenty of water. Water helps thin the mucus so your sinuses can drain more easily.  Use a humidifier.  Inhale steam 3 to 4 times a day (for example, sit in the bathroom with the shower running).  Apply a warm, moist washcloth to your face 3 to 4 times a day, or as directed by your caregiver.  Use saline nasal sprays to help moisten and clean your sinuses.  Take over-the-counter or  prescription medicines for pain, discomfort, or fever only as directed by your caregiver. SEEK IMMEDIATE MEDICAL CARE IF:  You have increasing pain or severe headaches.  You have nausea, vomiting, or drowsiness.  You have swelling around your face.  You have vision problems.  You  have a stiff neck.  You have difficulty breathing. MAKE SURE YOU:   Understand these instructions.  Will watch your condition.  Will get help right away if you are not doing well or get worse. Document Released: 02/15/2005 Document Revised: 05/10/2011 Document Reviewed: 03/02/2011 [facility] Patient Information 2013 [place], [place].

## 2012-04-18 NOTE — Assessment & Plan Note (Signed)
Bactrim bid, Mupirocin nasally for 7 days for h/o MRSA. Probiotics and mucinex, increase fluids and rest.

## 2012-04-18 NOTE — Progress Notes (Signed)
Patient ID: [name], female   DOB: 1960-05-05, 52 y.o.   MRN: [id number] Alejandra Miller [id number] 07-Oct-1960 04/18/2012      Progress Note-Follow Up  Subjective  Chief Complaint  Chief Complaint  Patient presents with  . Follow-up    from last weeks appt    HPI  Patient is a 52 year old Caucasian female who is in today for followup. At her last visit she was struggling with increasing depression and her Celexa was increased. She reports she thinks it has helped some she. She still cries but not as frequently. She denies suicidal ideation and overall does feel she's getting home her. No GI upset or other complaints noted at the moment. She has not tolerated CPAP. She does agree that she continues to struggle with fatigue and poor sleep as a result. No recent fevers or chills. No chest pain, palpitations, shortness of breath, GI or GU complaints.  Past Medical History  Diagnosis Date  . Rheumatoid arthritis   . Depression     counseling in 2010 for 2 months on medication  . GERD (gastroesophageal reflux disease) 2005    treated with omeprazole  . Seasonal allergies     affected in the spring  . Hypertension 2002  . High cholesterol 2002  . History of sinusitis     once/twice per year  . Fibrocystic breast 1988  . OSA (obstructive sleep apnea)     +sleep study 08/2010.    Past Surgical History  Procedure Laterality Date  . Tonsillectomy  1969    Family History  Problem Relation Age of Onset  . Osteoarthritis Mother   . Liver cancer Maternal Grandmother   . Hyperlipidemia Mother   . Hyperlipidemia Father   . Coronary artery disease Father   . Hypertension Father   . Stroke Father     multiple  . Depression Mother   . Diabetes Father     History   Social History  . Marital Status: Married    Spouse Name: N/A    Number of Children: N/A  . Years of Education: N/A   Occupational History  . Not on file.   Social History Main Topics  . Smoking status: Former  Games developer  . Smokeless tobacco: Never Used     Comment: Quit in 1991- started in 1983 1/2ppd  . Alcohol Use: Yes     Comment: once yearly  . Drug Use: Yes  . Sexually Active: Not on file   Other Topics Concern  . Not on file   Social History Narrative  . No narrative on file    Current Outpatient Prescriptions on File Prior to Visit  Medication Sig Dispense Refill  . amLODipine (NORVASC) 5 MG tablet take 1 tablet by mouth daily  30 tablet  6  . atorvastatin (LIPITOR) 20 MG tablet take 1 tablet by mouth once daily  30 tablet  3  . bisoprolol-hydrochlorothiazide (ZIAC) 5-6.25 MG per tablet take 2 tablets by mouth once daily  60 tablet  4  . buPROPion (WELLBUTRIN SR) 150 MG 12 hr tablet take 1 tablet by mouth twice a day  60 tablet  6  . Cholecalciferol (VITAMIN D3) 1000 UNITS CAPS Take 1 capsule by mouth daily.        . citalopram (CELEXA) 20 MG tablet Take 1 tablet (20 mg total) by mouth daily.  30 tablet  6  . cyclobenzaprine (FLEXERIL) 10 MG tablet take 1 tablet by mouth daily if needed  30  tablet  3  . LORazepam (ATIVAN) 1 MG tablet Take 0.5 tablets (0.5 mg total) by mouth at bedtime as needed for anxiety.  30 tablet  1  . Multiple Vitamin (MULTIVITAMIN) tablet Take 1 tablet by mouth daily.        . naproxen sodium (ANAPROX) 220 MG tablet Take 440 mg by mouth 2 (two) times daily with a meal.       . Omega-3 Fatty Acids (FISH OIL) 1200 MG CAPS Take 1,200 mg by mouth daily.        Marland Kitchen omeprazole (PRILOSEC) 20 MG capsule Take 1 capsule (20 mg total) by mouth daily.  30 capsule  5  . traMADol (ULTRAM) 50 MG tablet Take 1 tablet (50 mg total) by mouth every 6 (six) hours as needed for pain.  30 tablet  0   No current facility-administered medications on file prior to visit.    No Known Allergies  Review of Systems  Review of Systems  Constitutional: Negative for fever and malaise/fatigue.  HENT: Positive for congestion.   Eyes: Negative for pain and discharge.  Respiratory: Positive  for sputum production. Negative for shortness of breath.   Cardiovascular: Negative for chest pain, palpitations and leg swelling.  Gastrointestinal: Negative for nausea, abdominal pain and diarrhea.  Genitourinary: Negative for dysuria.  Musculoskeletal: Negative for falls.  Skin: Negative for rash.  Neurological: Positive for headaches. Negative for loss of consciousness.  Endo/Heme/Allergies: Negative for polydipsia.  Psychiatric/Behavioral: Positive for depression. Negative for suicidal ideas. The patient is nervous/anxious. The patient does not have insomnia.     Objective  BP 126/84  Pulse 64  Temp(Src) 97.9 F (36.6 C) (Oral)  Ht 5' 3.5" (1.613 m)  Wt 249 lb 1.3 oz (112.982 kg)  BMI 43.43 kg/m2  SpO2 96%  Physical Exam  Physical Exam  Constitutional: She is oriented to person, place, and time and well-developed, well-nourished, and in no distress. No distress.  HENT:  Head: Normocephalic and atraumatic.  Eyes: Conjunctivae are normal.  Neck: Neck supple. No thyromegaly present.  Cardiovascular: Normal rate, regular rhythm and normal heart sounds.   No murmur heard. Pulmonary/Chest: Effort normal and breath sounds normal. She has no wheezes.  Abdominal: She exhibits no distension and no mass.  Musculoskeletal: She exhibits no edema.  Lymphadenopathy:    She has no cervical adenopathy.  Neurological: She is alert and oriented to person, place, and time.  Skin: Skin is warm and dry. No rash noted. She is not diaphoretic.  Psychiatric: Memory, affect and judgment normal.    Lab Results  Component Value Date   TSH 1.169 06/26/2010   Lab Results  Component Value Date   WBC 4.4 02/04/2012   HGB 13.1 02/04/2012   HCT 39.3 02/04/2012   MCV 84.2 02/04/2012   PLT 328 02/04/2012   Lab Results  Component Value Date   CREATININE 0.87 02/04/2012   BUN 12 02/04/2012   NA 141 02/04/2012   K 4.4 02/04/2012   CL 105 02/04/2012   CO2 30 02/04/2012   Lab Results  Component Value  Date   ALT 23 02/04/2012   AST 24 02/04/2012   ALKPHOS 64 02/04/2012   BILITOT 0.5 02/04/2012   Lab Results  Component Value Date   CHOL 150 02/04/2012   Lab Results  Component Value Date   HDL 34* 02/04/2012   Lab Results  Component Value Date   LDLCALC 93 02/04/2012   Lab Results  Component Value Date   TRIG  116 02/04/2012   Lab Results  Component Value Date   CHOLHDL 4.4 02/04/2012     Assessment & Plan  Sinusitis Bactrim bid, Mupirocin nasally for 7 days for h/o MRSA. Probiotics and mucinex, increase fluids and rest.   Hypertension Well controlled , no changes  Depression Improving with increasing Citalopram, will reevalaute in 10 weeks or sooner as needed.  OSA (obstructive sleep apnea) Has an appt with dentist to discuss oral appliance she had trouble tolerating CPAP. She is encouraged to continue trying to treat her sleep apnea, we will consider adding meds to help her sleep with CPAP use if the dental appliance is not helpful

## 2012-05-03 ENCOUNTER — Telehealth: Payer: Self-pay | Admitting: Internal Medicine

## 2012-05-03 MED ORDER — AMOXICILLIN 500 MG PO TABS: 500.0000 mg | ORAL_TABLET | Freq: Three times a day (TID) | ORAL | Status: DC

## 2012-05-03 NOTE — Telephone Encounter (Signed)
Patient states that she was seen in February for a sinus infection. She says that she no longer has the congestion but is still feeling facial pain and pressure with headaches and would like to know if Dr. Abner Greenspan would call her in another antibiotic to [facility] in [place].

## 2012-05-03 NOTE — Telephone Encounter (Signed)
Can have Amox 500 mg 1 cap po tid x 7 days after that needs to come in, take probiotics and mucinex at same time

## 2012-05-03 NOTE — Telephone Encounter (Signed)
Pt informed and RX sent to pharmacy  

## 2012-05-03 NOTE — Telephone Encounter (Signed)
Please advise 

## 2012-05-07 ENCOUNTER — Other Ambulatory Visit: Payer: Self-pay | Admitting: Internal Medicine

## 2012-05-08 NOTE — Telephone Encounter (Signed)
Received refill request from pharmacy for tramadol 50mg  1 tablet twice a day. Current med list indicates directions as 1 tablet every 6 hours as needed and was last printed on 11/29/11 at office visit. Left detailed message on pt's cell# to call and let us know how she currently takes medication and reason that she takes medication.

## 2012-05-09 NOTE — Telephone Encounter (Signed)
Pt returned my call and stated that she usually takes tramadol very sparingly. Arthritis seems worse x 1-2 months and she has been taking 1 at bedtime. On Sunday she bent over the sink and now has flare up of her back pain and has been taking it three times a day. Please advise of directions and quantity?

## 2012-05-09 NOTE — Telephone Encounter (Signed)
Directions are Ok but can have a disp #90 once then back to regular disp Quantity

## 2012-06-01 ENCOUNTER — Other Ambulatory Visit: Payer: Self-pay | Admitting: Internal Medicine

## 2012-06-14 ENCOUNTER — Other Ambulatory Visit: Payer: Self-pay | Admitting: Family Medicine

## 2012-06-14 DIAGNOSIS — Z1231 Encounter for screening mammogram for malignant neoplasm of breast: Secondary | ICD-10-CM

## 2012-06-15 ENCOUNTER — Other Ambulatory Visit: Payer: Self-pay | Admitting: Family

## 2012-06-15 NOTE — Telephone Encounter (Signed)
Bupropion request [last Rx 08.18.13 #[id number]]/SLS Please advise.

## 2012-07-07 ENCOUNTER — Ambulatory Visit: Payer: Managed Care, Other (non HMO) | Admitting: Family

## 2012-07-07 ENCOUNTER — Telehealth: Payer: Self-pay

## 2012-07-07 NOTE — Telephone Encounter (Signed)
Pt left a message asking if Dr Abner Greenspan with do her pap with her physical on 07-20-12?  I left a detailed message on cell stating we could do her pap at this appt.

## 2012-07-20 ENCOUNTER — Other Ambulatory Visit (HOSPITAL_COMMUNITY)
Admission: RE | Admit: 2012-07-20 | Discharge: 2012-07-20 | Disposition: A | Discharge status: Home / Self Care | Payer: Managed Care, Other (non HMO) | Source: Ambulatory Visit | Attending: Family Medicine | Admitting: Family Medicine

## 2012-07-20 ENCOUNTER — Ambulatory Visit (HOSPITAL_BASED_OUTPATIENT_CLINIC_OR_DEPARTMENT_OTHER)
Admission: RE | Admit: 2012-07-20 | Discharge: 2012-07-20 | Disposition: A | Discharge status: Home / Self Care | Payer: Managed Care, Other (non HMO) | Source: Ambulatory Visit | Attending: Family Medicine | Admitting: Family Medicine

## 2012-07-20 ENCOUNTER — Encounter: Payer: Self-pay | Admitting: Family Medicine

## 2012-07-20 ENCOUNTER — Ambulatory Visit (INDEPENDENT_AMBULATORY_CARE_PROVIDER_SITE_OTHER): Payer: Managed Care, Other (non HMO) | Admitting: Family Medicine

## 2012-07-20 VITALS — BP 128/82 | HR 59 | Temp 98.0°F | Ht 63.5 in | Wt 251.1 lb

## 2012-07-20 DIAGNOSIS — E785 Hyperlipidemia, unspecified: Secondary | ICD-10-CM

## 2012-07-20 DIAGNOSIS — Z124 Encounter for screening for malignant neoplasm of cervix: Secondary | ICD-10-CM

## 2012-07-20 DIAGNOSIS — Z01419 Encounter for gynecological examination (general) (routine) without abnormal findings: Secondary | ICD-10-CM | POA: Insufficient documentation

## 2012-07-20 DIAGNOSIS — I10 Essential (primary) hypertension: Secondary | ICD-10-CM

## 2012-07-20 DIAGNOSIS — G4733 Obstructive sleep apnea (adult) (pediatric): Secondary | ICD-10-CM

## 2012-07-20 DIAGNOSIS — Z0000 Encounter for general adult medical examination without abnormal findings: Secondary | ICD-10-CM

## 2012-07-20 DIAGNOSIS — Z1231 Encounter for screening mammogram for malignant neoplasm of breast: Secondary | ICD-10-CM | POA: Insufficient documentation

## 2012-07-20 HISTORY — DX: Encounter for screening for malignant neoplasm of cervix: Z12.4

## 2012-07-20 LAB — HEPATIC FUNCTION PANEL
AST: 24 U/L (ref 0–37)
Albumin: 4 g/dL (ref 3.5–5.2)
Alkaline Phosphatase: 75 U/L (ref 39–117)
Total Protein: 6.5 g/dL (ref 6.0–8.3)

## 2012-07-20 LAB — RENAL FUNCTION PANEL
BUN: 12 mg/dL (ref 6–23)
CO2: 30 mEq/L (ref 19–32)
Chloride: 102 mEq/L (ref 96–112)
Creat: 1 mg/dL (ref 0.50–1.10)
Phosphorus: 3.1 mg/dL (ref 2.3–4.6)

## 2012-07-20 LAB — CBC
MCH: 28.2 pg (ref 26.0–34.0)
MCHC: 33.7 g/dL (ref 30.0–36.0)
Platelets: 387 10*3/uL (ref 150–400)

## 2012-07-20 LAB — LIPID PANEL: Cholesterol: 155 mg/dL (ref 0–200)

## 2012-07-20 NOTE — Assessment & Plan Note (Signed)
Using the dental appliance routinely and feeling better

## 2012-07-20 NOTE — Progress Notes (Signed)
Patient ID: Alejandra Miller, female   DOB: 1960-04-03, 52 y.o.   MRN: [id number] Alejandra Miller [id number] 12/18/1960 07/20/2012      Progress Note-Follow Up  Subjective  Chief Complaint  Chief Complaint  Patient presents with  . Annual Exam    physical  . Gynecologic Exam    pap    HPI  The patient is a 52 year old female in today for followup. Should her recent sinus infection. In February she was treated with antibiotic with Cleocin and a couple of weeks ago symptoms returned. At present she has some congestion and cough productive of gray phlegm some intermittent fevers are questionable but she has had headaches for myalgias. Fatigue is noted. No chest pain, palpitations, shortness or breath, GI or GU complaints. No GYN concerns today  Past Medical History  Diagnosis Date  . Rheumatoid arthritis   . Depression     counseling in 2010 for 2 months on medication  . GERD (gastroesophageal reflux disease) 2005    treated with omeprazole  . Seasonal allergies     affected in the spring  . Hypertension 2002  . High cholesterol 2002  . History of sinusitis     once/twice per year  . Fibrocystic breast 1988  . OSA (obstructive sleep apnea)     +sleep study 08/2010.  Marland Kitchen Cervical cancer screening 07/20/2012    Menarche at 13, regular til recently No h/o abnormal paps, last pap 2-3 years ago G0P0  MGM today, no h/o abnormal MGMs Spotting now monthly and scant H/o uterine polyps    Past Surgical History  Procedure Laterality Date  . Tonsillectomy  1969    Family History  Problem Relation Age of Onset  . Osteoarthritis Mother   . Liver cancer Maternal Grandmother   . Hyperlipidemia Mother   . Hyperlipidemia Father   . Coronary artery disease Father   . Hypertension Father   . Stroke Father     multiple  . Depression Mother   . Diabetes Father     History   Social History  . Marital Status: Married    Spouse Name: N/A    Number of Children: N/A  . Years of Education: N/A    Occupational History  . Not on file.   Social History Main Topics  . Smoking status: Former Games developer  . Smokeless tobacco: Never Used     Comment: Quit in 1991- started in 1983 1/2ppd  . Alcohol Use: Yes     Comment: once yearly  . Drug Use: Yes  . Sexually Active: Not on file   Other Topics Concern  . Not on file   Social History Narrative  . No narrative on file    Current Outpatient Prescriptions on File Prior to Visit  Medication Sig Dispense Refill  . amLODipine (NORVASC) 5 MG tablet take 1 tablet by mouth daily  30 tablet  3  . atorvastatin (LIPITOR) 20 MG tablet take 1 tablet by mouth once daily  30 tablet  3  . bisoprolol-hydrochlorothiazide (ZIAC) 5-6.25 MG per tablet take 2 tablets by mouth once daily  60 tablet  4  . buPROPion (WELLBUTRIN SR) 150 MG 12 hr tablet take 1 tablet by mouth twice a day  60 tablet  6  . Cholecalciferol (VITAMIN D3) 1000 UNITS CAPS Take 1 capsule by mouth daily.        . citalopram (CELEXA) 20 MG tablet Take 1 tablet (20 mg total) by mouth daily.  30 tablet  6  . cyclobenzaprine (FLEXERIL) 10 MG tablet take 1 tablet by mouth daily if needed  30 tablet  3  . LORazepam (ATIVAN) 1 MG tablet Take 0.5 tablets (0.5 mg total) by mouth at bedtime as needed for anxiety.  30 tablet  1  . Multiple Vitamin (MULTIVITAMIN) tablet Take 1 tablet by mouth daily.        . naproxen sodium (ANAPROX) 220 MG tablet Take 440 mg by mouth 2 (two) times daily with a meal.       . Omega-3 Fatty Acids (FISH OIL) 1200 MG CAPS Take 1,200 mg by mouth daily.        Marland Kitchen omeprazole (PRILOSEC) 20 MG capsule Take 1 capsule (20 mg total) by mouth daily.  30 capsule  5  . traMADol (ULTRAM) 50 MG tablet take 1 tablet by mouth twice a day  90 tablet  0   No current facility-administered medications on file prior to visit.    No Known Allergies  Review of Systems  Review of Systems  Constitutional: Negative for fever and malaise/fatigue.  HENT: Negative for congestion.    Eyes: Negative for discharge.  Respiratory: Negative for shortness of breath.   Cardiovascular: Negative for chest pain, palpitations and leg swelling.  Gastrointestinal: Negative for nausea, abdominal pain and diarrhea.  Genitourinary: Negative for dysuria.  Musculoskeletal: Negative for falls.  Skin: Negative for rash.  Neurological: Negative for loss of consciousness and headaches.  Endo/Heme/Allergies: Negative for polydipsia.  Psychiatric/Behavioral: Negative for depression and suicidal ideas. The patient is not nervous/anxious and does not have insomnia.     Objective  BP 128/82  Pulse 59  Temp(Src) 98 F (36.7 C) (Oral)  Ht 5' 3.5" (1.613 m)  Wt 251 lb 1.3 oz (113.889 kg)  BMI 43.77 kg/m2  SpO2 96%  Physical Exam  Physical Exam  Constitutional: She is oriented to person, place, and time and well-developed, well-nourished, and in no distress. No distress.  HENT:  Head: Normocephalic and atraumatic.  Eyes: Conjunctivae are normal.  Neck: Neck supple. No thyromegaly present.  Cardiovascular: Normal rate, regular rhythm and normal heart sounds.   No murmur heard. Pulmonary/Chest: Effort normal and breath sounds normal. She has no wheezes.  Abdominal: She exhibits no distension and no mass.  Musculoskeletal: She exhibits no edema.  Lymphadenopathy:    She has no cervical adenopathy.  Neurological: She is alert and oriented to person, place, and time.  Skin: Skin is warm and dry. No rash noted. She is not diaphoretic.  Psychiatric: Memory, affect and judgment normal.    Lab Results  Component Value Date   TSH 1.169 06/26/2010   Lab Results  Component Value Date   WBC 4.4 02/04/2012   HGB 13.1 02/04/2012   HCT 39.3 02/04/2012   MCV 84.2 02/04/2012   PLT 328 02/04/2012   Lab Results  Component Value Date   CREATININE 0.87 02/04/2012   BUN 12 02/04/2012   NA 141 02/04/2012   K 4.4 02/04/2012   CL 105 02/04/2012   CO2 30 02/04/2012   Lab Results  Component Value  Date   ALT 23 02/04/2012   AST 24 02/04/2012   ALKPHOS 64 02/04/2012   BILITOT 0.5 02/04/2012   Lab Results  Component Value Date   CHOL 150 02/04/2012   Lab Results  Component Value Date   HDL 34* 02/04/2012   Lab Results  Component Value Date   LDLCALC 93 02/04/2012   Lab Results  Component Value Date  TRIG 116 02/04/2012   Lab Results  Component Value Date   CHOLHDL 4.4 02/04/2012     Assessment & Plan  OSA (obstructive sleep apnea) Using the dental appliance routinely and feeling better  Annual physical exam Encouraged DASH diet, regular exercise, 8 hours of sleep. Fasting labs done today  Cervical cancer screening Pap taken today, no concerns notified on exam today  Hypertension Well controlled no changes to meds  Hyperlipidemia Tolerating Lipitor, avoid trans fats. Consider krill oil caps

## 2012-07-20 NOTE — Patient Instructions (Addendum)
Krill oil Labs prior to next appt, lipid, renal, cbc, tsh, hepatic   Sleep Apnea  Sleep apnea is a sleep disorder characterized by abnormal pauses in breathing while you sleep. When your breathing pauses, the level of oxygen in your blood decreases. This causes you to move out of deep sleep and into light sleep. As a result, your quality of sleep is poor, and the system that carries your blood throughout your body (cardiovascular system) experiences stress. If sleep apnea remains untreated, the following conditions can develop:  High blood pressure (hypertension).  Coronary artery disease.  Inability to achieve or maintain an erection (impotence).  Impairment of your thought process (cognitive dysfunction). There are three types of sleep apnea: 1. Obstructive sleep apnea Pauses in breathing during sleep because of a blocked airway. 2. Central sleep apnea Pauses in breathing during sleep because the area of the brain that controls your breathing does not send the correct signals to the muscles that control breathing. 3. Mixed sleep apnea A combination of both obstructive and central sleep apnea. RISK FACTORS The following risk factors can increase your risk of developing sleep apnea:  Being overweight.  Smoking.  Having narrow passages in your nose and throat.  Being of older age.  Being female.  Alcohol use.  Sedative and tranquilizer use.  Ethnicity. Among individuals younger than 35 years, African Americans are at increased risk of sleep apnea. SYMPTOMS   Difficulty staying asleep.  Daytime sleepiness and fatigue.  Loss of energy.  Irritability.  Loud, heavy snoring.  Morning headaches.  Trouble concentrating.  Forgetfulness.  Decreased interest in sex. DIAGNOSIS  In order to diagnose sleep apnea, your caregiver will perform a physical examination. Your caregiver may suggest that you take a home sleep test. Your caregiver may also recommend that you spend the  night in a sleep lab. In the sleep lab, several monitors record information about your heart, lungs, and brain while you sleep. Your leg and arm movements and blood oxygen level are also recorded. TREATMENT The following actions may help to resolve mild sleep apnea:  Sleeping on your side.   Using a decongestant if you have nasal congestion.   Avoiding the use of depressants, including alcohol, sedatives, and narcotics.   Losing weight and modifying your diet if you are overweight. There also are devices and treatments to help open your airway:  Oral appliances. These are custom-made mouthpieces that shift your lower jaw forward and slightly open your bite. This opens your airway.  Devices that create positive airway pressure. This positive pressure "splints" your airway open to help you breathe better during sleep. The following devices create positive airway pressure:  Continuous positive airway pressure (CPAP) device. The CPAP device creates a continuous level of air pressure with an air pump. The air is delivered to your airway through a mask while you sleep. This continuous pressure keeps your airway open.  Nasal expiratory positive airway pressure (EPAP) device. The EPAP device creates positive air pressure as you exhale. The device consists of single-use valves, which are inserted into each nostril and held in place by adhesive. The valves create very little resistance when you inhale but create much more resistance when you exhale. That increased resistance creates the positive airway pressure. This positive pressure while you exhale keeps your airway open, making it easier to breath when you inhale again.  Bilevel positive airway pressure (BPAP) device. The BPAP device is used mainly in patients with central sleep apnea. This device is  similar to the CPAP device because it also uses an air pump to deliver continuous air pressure through a mask. However, with the BPAP machine, the  pressure is set at two different levels. The pressure when you exhale is lower than the pressure when you inhale.  Surgery. Typically, surgery is only done if you cannot comply with less invasive treatments or if the less invasive treatments do not improve your condition. Surgery involves removing excess tissue in your airway to create a wider passage way. Document Released: 02/05/2002 Document Revised: 08/17/2011 Document Reviewed: 06/24/2011 [facility] Patient Information 2014 [place], [place].

## 2012-07-24 NOTE — Assessment & Plan Note (Signed)
Tolerating Lipitor, avoid trans fats. Consider krill oil caps

## 2012-07-24 NOTE — Assessment & Plan Note (Addendum)
Encouraged DASH diet, regular exercise, 8 hours of sleep. Fasting labs done today

## 2012-07-24 NOTE — Assessment & Plan Note (Signed)
Well controlled no changes to meds 

## 2012-07-24 NOTE — Assessment & Plan Note (Signed)
Pap taken today, no concerns notified on exam today

## 2012-07-25 ENCOUNTER — Ambulatory Visit: Payer: Managed Care, Other (non HMO) | Admitting: Family Medicine

## 2012-07-30 DIAGNOSIS — S62109A Fracture of unspecified carpal bone, unspecified wrist, initial encounter for closed fracture: Secondary | ICD-10-CM

## 2012-07-30 HISTORY — PX: WRIST FRACTURE SURGERY: SHX121

## 2012-07-30 HISTORY — DX: Fracture of unspecified carpal bone, unspecified wrist, initial encounter for closed fracture: S62.109A

## 2012-07-31 ENCOUNTER — Encounter: Payer: Self-pay | Admitting: Family Medicine

## 2012-08-02 ENCOUNTER — Emergency Department (HOSPITAL_BASED_OUTPATIENT_CLINIC_OR_DEPARTMENT_OTHER)
Admission: EM | Admit: 2012-08-02 | Discharge: 2012-08-02 | Disposition: A | Discharge status: Home / Self Care | Payer: Managed Care, Other (non HMO) | Attending: Emergency Medicine | Admitting: Emergency Medicine

## 2012-08-02 ENCOUNTER — Emergency Department (HOSPITAL_BASED_OUTPATIENT_CLINIC_OR_DEPARTMENT_OTHER): Payer: Managed Care, Other (non HMO)

## 2012-08-02 ENCOUNTER — Encounter (HOSPITAL_BASED_OUTPATIENT_CLINIC_OR_DEPARTMENT_OTHER): Payer: Self-pay | Admitting: Family Medicine

## 2012-08-02 DIAGNOSIS — Z8709 Personal history of other diseases of the respiratory system: Secondary | ICD-10-CM | POA: Insufficient documentation

## 2012-08-02 DIAGNOSIS — F329 Major depressive disorder, single episode, unspecified: Secondary | ICD-10-CM | POA: Insufficient documentation

## 2012-08-02 DIAGNOSIS — Y929 Unspecified place or not applicable: Secondary | ICD-10-CM | POA: Insufficient documentation

## 2012-08-02 DIAGNOSIS — S52501A Unspecified fracture of the lower end of right radius, initial encounter for closed fracture: Secondary | ICD-10-CM

## 2012-08-02 DIAGNOSIS — Z8742 Personal history of other diseases of the female genital tract: Secondary | ICD-10-CM | POA: Insufficient documentation

## 2012-08-02 DIAGNOSIS — Z8541 Personal history of malignant neoplasm of cervix uteri: Secondary | ICD-10-CM | POA: Insufficient documentation

## 2012-08-02 DIAGNOSIS — K219 Gastro-esophageal reflux disease without esophagitis: Secondary | ICD-10-CM | POA: Insufficient documentation

## 2012-08-02 DIAGNOSIS — F3289 Other specified depressive episodes: Secondary | ICD-10-CM | POA: Insufficient documentation

## 2012-08-02 DIAGNOSIS — Z87891 Personal history of nicotine dependence: Secondary | ICD-10-CM | POA: Insufficient documentation

## 2012-08-02 DIAGNOSIS — E7800 Pure hypercholesterolemia, unspecified: Secondary | ICD-10-CM | POA: Insufficient documentation

## 2012-08-02 DIAGNOSIS — Y939 Activity, unspecified: Secondary | ICD-10-CM | POA: Insufficient documentation

## 2012-08-02 DIAGNOSIS — W010XXA Fall on same level from slipping, tripping and stumbling without subsequent striking against object, initial encounter: Secondary | ICD-10-CM | POA: Insufficient documentation

## 2012-08-02 DIAGNOSIS — S52599A Other fractures of lower end of unspecified radius, initial encounter for closed fracture: Secondary | ICD-10-CM | POA: Insufficient documentation

## 2012-08-02 DIAGNOSIS — G4733 Obstructive sleep apnea (adult) (pediatric): Secondary | ICD-10-CM | POA: Insufficient documentation

## 2012-08-02 DIAGNOSIS — I10 Essential (primary) hypertension: Secondary | ICD-10-CM | POA: Insufficient documentation

## 2012-08-02 DIAGNOSIS — Z79899 Other long term (current) drug therapy: Secondary | ICD-10-CM | POA: Insufficient documentation

## 2012-08-02 DIAGNOSIS — M069 Rheumatoid arthritis, unspecified: Secondary | ICD-10-CM | POA: Insufficient documentation

## 2012-08-02 MED ORDER — HYDROCODONE-ACETAMINOPHEN 5-325 MG PO TABS: 1.0000 | ORAL_TABLET | Freq: Three times a day (TID) | ORAL | Status: DC | PRN

## 2012-08-02 MED ORDER — HYDROCODONE-ACETAMINOPHEN 5-325 MG PO TABS
1.0000 | ORAL_TABLET | Freq: Once | ORAL | Status: AC
  Administered 2012-08-02: 1 via ORAL
  Filled 2012-08-02: qty 1

## 2012-08-02 NOTE — ED Notes (Signed)
Patient given ice pack to apply to injury.  

## 2012-08-02 NOTE — ED Notes (Signed)
Pt sts she "lost footing" on uneven ground and fell injuring right wrist and forearm. No loc. No other injuries.

## 2012-08-02 NOTE — ED Provider Notes (Signed)
History     CSN: [id number]  Arrival date & time 08/02/12  0804   First MD Initiated Contact with Patient 08/02/12 [phone]      Chief Complaint  Patient presents with  . Wrist Pain    (Consider location/radiation/quality/duration/timing/severity/associated sxs/prior treatment) HPI Patient presents immediately after sustaining an injury to her right wrist. She states that she lost her balance, fell onto her outstretched right arm.  Since that time she's had pain focally about the wrist, with mild radiation proximally.  There is no shoulder pain, no hip pain, no headache, the patient denies any other active complaints. No attempts at relief thus far.  The pain is sore.  There is no dysesthesia or weakness in the hand.   Past Medical History  Diagnosis Date  . Rheumatoid arthritis(714.0)   . Depression     counseling in 2010 for 2 months on medication  . GERD (gastroesophageal reflux disease) 2005    treated with omeprazole  . Seasonal allergies     affected in the spring  . Hypertension 2002  . High cholesterol 2002  . History of sinusitis     once/twice per year  . Fibrocystic breast 1988  . OSA (obstructive sleep apnea)     +sleep study 08/2010.  Marland Kitchen Cervical cancer screening 07/20/2012    Menarche at 13, regular til recently No h/o abnormal paps, last pap 2-3 years ago G0P0  MGM today, no h/o abnormal MGMs Spotting now monthly and scant H/o uterine polyps    Past Surgical History  Procedure Laterality Date  . Tonsillectomy  1969    Family History  Problem Relation Age of Onset  . Osteoarthritis Mother   . Liver cancer Maternal Grandmother   . Hyperlipidemia Mother   . Hyperlipidemia Father   . Coronary artery disease Father   . Hypertension Father   . Stroke Father     multiple  . Depression Mother   . Diabetes Father     History  Substance Use Topics  . Smoking status: Former Games developer  . Smokeless tobacco: Never Used     Comment: Quit in 1991- started in 1983  1/2ppd  . Alcohol Use: Yes     Comment: once yearly    OB History   Grav Para Term Preterm Abortions TAB SAB Ect Mult Living                  Review of Systems  All other systems reviewed and are negative.    Allergies  Review of patient's allergies indicates no known allergies.  Home Medications   Current Outpatient Rx  Name  Route  Sig  Dispense  Refill  . amLODipine (NORVASC) 5 MG tablet      take 1 tablet by mouth daily   30 tablet   3   . atorvastatin (LIPITOR) 20 MG tablet      take 1 tablet by mouth once daily   30 tablet   3   . bisoprolol-hydrochlorothiazide (ZIAC) 5-6.25 MG per tablet      take 2 tablets by mouth once daily   60 tablet   4   . buPROPion (WELLBUTRIN SR) 150 MG 12 hr tablet      take 1 tablet by mouth twice a day   60 tablet   6   . Cholecalciferol (VITAMIN D3) 1000 UNITS CAPS   Oral   Take 1 capsule by mouth daily.           Marland Kitchen  citalopram (CELEXA) 20 MG tablet   Oral   Take 1 tablet (20 mg total) by mouth daily.   30 tablet   6   . cyclobenzaprine (FLEXERIL) 10 MG tablet      take 1 tablet by mouth daily if needed   30 tablet   3   . HYDROcodone-acetaminophen (NORCO/VICODIN) 5-325 MG per tablet   Oral   Take 1 tablet by mouth every 8 (eight) hours as needed for pain.   15 tablet   0   . LORazepam (ATIVAN) 1 MG tablet   Oral   Take 0.5 tablets (0.5 mg total) by mouth at bedtime as needed for anxiety.   30 tablet   1   . Multiple Vitamin (MULTIVITAMIN) tablet   Oral   Take 1 tablet by mouth daily.           . naproxen sodium (ANAPROX) 220 MG tablet   Oral   Take 440 mg by mouth 2 (two) times daily with a meal.          . Omega-3 Fatty Acids (FISH OIL) 1200 MG CAPS   Oral   Take 1,200 mg by mouth daily.           Marland Kitchen omeprazole (PRILOSEC) 20 MG capsule   Oral   Take 1 capsule (20 mg total) by mouth daily.   30 capsule   5     BP 132/82  Pulse 65  Temp(Src) 98.2 F (36.8 C) (Oral)  Resp 16   SpO2 100%  Physical Exam  Nursing note and vitals reviewed. Constitutional: She is oriented to person, place, and time. She appears well-developed and well-nourished. No distress.  HENT:  Head: Normocephalic and atraumatic.  Eyes: Conjunctivae and EOM are normal.  Cardiovascular: Normal rate and regular rhythm.   Pulmonary/Chest: Effort normal and breath sounds normal. No stridor. No respiratory distress.  Abdominal: She exhibits no distension.  Musculoskeletal: She exhibits no edema.       Right shoulder: Normal.       Right elbow: Normal.      Right wrist: She exhibits decreased range of motion, tenderness, bony tenderness, swelling and deformity. She exhibits no effusion, no crepitus and no laceration.  Though the wrist is tender to palpation, primarily on the lateral aspect, the patient can flex and extend all digits are appropriately, and has minimal flexion and extension capacity of the wrist.  This is limited secondary to pain.  Neurological: She is alert and oriented to person, place, and time. No cranial nerve deficit.  Skin: Skin is warm and dry.  Psychiatric: She has a normal mood and affect.    ED Course  ORTHOPEDIC INJURY TREATMENT Date/Time: 08/02/2012 10:00 AM Performed by: Gerhard Munch Authorized by: Gerhard Munch Consent: Verbal consent obtained. Risks and benefits: risks, benefits and alternatives were discussed Consent given by: patient Patient understanding: patient states understanding of the procedure being performed Patient consent: the patient's understanding of the procedure matches consent given Procedure consent: procedure consent matches procedure scheduled Relevant documents: relevant documents present and verified Test results: test results available and properly labeled Site marked: the operative site was marked Imaging studies: imaging studies available Required items: required blood products, implants, devices, and special equipment  available Patient identity confirmed: verbally with patient Time out: Immediately prior to procedure a "time out" was called to verify the correct patient, procedure, equipment, support staff and site/side marked as required. Injury location: wrist Location details: right wrist Injury type: fracture Fracture  type: distal radius Pre-procedure neurovascular assessment: neurovascularly intact Pre-procedure distal perfusion: normal Pre-procedure neurological function: normal Pre-procedure range of motion: reduced Local anesthesia used: no (Oral narcotics) Patient sedated: no Manipulation performed: mild traction to apply the splint. Splint type: radial gutter Supplies used: Ortho-Glass Post-procedure neurovascular assessment: post-procedure neurovascularly intact Post-procedure distal perfusion: normal Post-procedure neurological function: normal Post-procedure range of motion: unchanged Patient tolerance: Patient tolerated the procedure well with no immediate complications.   (including critical care time)  Labs Reviewed - No data to display Dg Forearm Right  08/02/2012   *RADIOLOGY REPORT*  Clinical Data: Fall, wrist pain.  RIGHT FOREARM - 2 VIEW  Comparison: None.  Findings: Distal radius fracture is identified as on dedicated plain films of the wrist.  No other acute bony or joint abnormality is seen.  No elbow joint effusion.  IMPRESSION: Acute distal radius fracture.  No other abnormality.   Original Report Authenticated By: Holley Dexter, M.D.   Dg Wrist Complete Right  08/02/2012   *RADIOLOGY REPORT*  Clinical Data: Fall.  Right wrist pain.  RIGHT WRIST - COMPLETE 3+ VIEW  Comparison: None.  Findings: The patient has an impacted and dorsally angulated intra- articular fracture of the distal radius.  Associated soft tissue swelling is noted.  No other fracture is identified.  IMPRESSION: Acute distal radius fracture.   Original Report Authenticated By: Holley Dexter, M.D.   I  interpreted the x-ray, then demonstrated to the patient and her husband.  1. Distal radius fracture, right, closed, initial encounter       MDM  Patient presents after sustaining an injury to her right wrist.  On exam she is neurovascularly intact, though she is limited range of motion secondary to deformity, which is demonstrated to be a fracture on x-ray.  The patient had reduction, immobilization, and was discharged in stable condition to follow up with our orthopedist.        Gerhard Munch, MD 08/02/12 1131

## 2012-08-03 ENCOUNTER — Telehealth: Payer: Self-pay

## 2012-08-03 MED ORDER — HYDROCODONE-ACETAMINOPHEN 10-325 MG PO TABS: 1.0000 | ORAL_TABLET | Freq: Three times a day (TID) | ORAL | Status: DC | PRN

## 2012-08-03 NOTE — Telephone Encounter (Signed)
Pt left a message stating that she is in touch with ortho.  Pt also stated that the Hyrocodone 5-325 is not helping with pain? Pt would like to know if she can take 2 tabs instead of 1? Pt also stated she only got 15 tabs from ED and would like another RX?  Pt would also like to know if its ok to take her Ultram PRN?  Please advise?

## 2012-08-03 NOTE — Telephone Encounter (Signed)
RX faxed and message left on cell phone

## 2012-08-03 NOTE — Telephone Encounter (Signed)
FYI: pt states she was seen downstairs last night due to falling and breaking her right wrist.

## 2012-08-03 NOTE — Telephone Encounter (Signed)
Left a detailed message for pt and asked pt to return call if appt was needed

## 2012-08-03 NOTE — Telephone Encounter (Signed)
Norco 10/325 1 tab po tid prn pain, disp #40, can alternate with Tramadol as needed

## 2012-08-03 NOTE — Telephone Encounter (Signed)
Does she need any referrals or a follow up here?

## 2012-09-12 ENCOUNTER — Emergency Department (HOSPITAL_BASED_OUTPATIENT_CLINIC_OR_DEPARTMENT_OTHER)
Admission: EM | Admit: 2012-09-12 | Discharge: 2012-09-12 | Disposition: A | Discharge status: Home / Self Care | Payer: Managed Care, Other (non HMO) | Attending: Emergency Medicine | Admitting: Emergency Medicine

## 2012-09-12 ENCOUNTER — Telehealth: Payer: Self-pay | Admitting: Family Medicine

## 2012-09-12 ENCOUNTER — Encounter (HOSPITAL_BASED_OUTPATIENT_CLINIC_OR_DEPARTMENT_OTHER): Payer: Self-pay | Admitting: *Deleted

## 2012-09-12 DIAGNOSIS — Z8742 Personal history of other diseases of the female genital tract: Secondary | ICD-10-CM | POA: Insufficient documentation

## 2012-09-12 DIAGNOSIS — N898 Other specified noninflammatory disorders of vagina: Secondary | ICD-10-CM | POA: Insufficient documentation

## 2012-09-12 DIAGNOSIS — Z791 Long term (current) use of non-steroidal anti-inflammatories (NSAID): Secondary | ICD-10-CM | POA: Insufficient documentation

## 2012-09-12 DIAGNOSIS — E7800 Pure hypercholesterolemia, unspecified: Secondary | ICD-10-CM | POA: Insufficient documentation

## 2012-09-12 DIAGNOSIS — Z8709 Personal history of other diseases of the respiratory system: Secondary | ICD-10-CM | POA: Insufficient documentation

## 2012-09-12 DIAGNOSIS — G4733 Obstructive sleep apnea (adult) (pediatric): Secondary | ICD-10-CM | POA: Insufficient documentation

## 2012-09-12 DIAGNOSIS — Z87891 Personal history of nicotine dependence: Secondary | ICD-10-CM | POA: Insufficient documentation

## 2012-09-12 DIAGNOSIS — I10 Essential (primary) hypertension: Secondary | ICD-10-CM | POA: Insufficient documentation

## 2012-09-12 DIAGNOSIS — N939 Abnormal uterine and vaginal bleeding, unspecified: Secondary | ICD-10-CM

## 2012-09-12 DIAGNOSIS — Z79899 Other long term (current) drug therapy: Secondary | ICD-10-CM | POA: Insufficient documentation

## 2012-09-12 DIAGNOSIS — F3289 Other specified depressive episodes: Secondary | ICD-10-CM | POA: Insufficient documentation

## 2012-09-12 DIAGNOSIS — Z124 Encounter for screening for malignant neoplasm of cervix: Secondary | ICD-10-CM | POA: Insufficient documentation

## 2012-09-12 DIAGNOSIS — M069 Rheumatoid arthritis, unspecified: Secondary | ICD-10-CM | POA: Insufficient documentation

## 2012-09-12 DIAGNOSIS — F329 Major depressive disorder, single episode, unspecified: Secondary | ICD-10-CM | POA: Insufficient documentation

## 2012-09-12 DIAGNOSIS — K219 Gastro-esophageal reflux disease without esophagitis: Secondary | ICD-10-CM | POA: Insufficient documentation

## 2012-09-12 LAB — CBC WITH DIFFERENTIAL/PLATELET
Basophils Relative: 0 % (ref 0–1)
Eosinophils Absolute: 0.2 10*3/uL (ref 0.0–0.7)
Eosinophils Relative: 2 % (ref 0–5)
Lymphs Abs: 2.2 10*3/uL (ref 0.7–4.0)
MCH: 29.2 pg (ref 26.0–34.0)
MCHC: 33.9 g/dL (ref 30.0–36.0)
MCV: 86.1 fL (ref 78.0–100.0)
Neutrophils Relative %: 62 % (ref 43–77)
Platelets: 379 10*3/uL (ref 150–400)
RBC: 4.62 MIL/uL (ref 3.87–5.11)

## 2012-09-12 LAB — URINALYSIS, ROUTINE W REFLEX MICROSCOPIC
Bilirubin Urine: NEGATIVE
Nitrite: NEGATIVE
Protein, ur: NEGATIVE mg/dL
Specific Gravity, Urine: 1.008 (ref 1.005–1.030)
Urobilinogen, UA: 0.2 mg/dL (ref 0.0–1.0)

## 2012-09-12 LAB — WET PREP, GENITAL
Clue Cells Wet Prep HPF POC: NONE SEEN
Trich, Wet Prep: NONE SEEN
Yeast Wet Prep HPF POC: NONE SEEN

## 2012-09-12 LAB — BASIC METABOLIC PANEL
BUN: 11 mg/dL (ref 6–23)
Calcium: 10.2 mg/dL (ref 8.4–10.5)
GFR calc Af Amer: >90 mL/min (ref >90)
GFR calc non Af Amer: 84 mL/min — ABNORMAL LOW (ref >90)
Glucose, Bld: 102 mg/dL — ABNORMAL HIGH (ref 70–99)
Potassium: 3.9 mEq/L (ref 3.5–5.1)
Sodium: 140 mEq/L (ref 135–145)

## 2012-09-12 NOTE — Telephone Encounter (Signed)
FYI: Call a nurse called me and I advised that patient go to ED or GYN office

## 2012-09-12 NOTE — ED Provider Notes (Signed)
History    CSN: [id number] Arrival date & time 09/12/12  1344  First MD Initiated Contact with Patient 09/12/12 1433     Chief Complaint  Patient presents with  . Vaginal Bleeding   (Consider location/radiation/quality/duration/timing/severity/associated sxs/prior Treatment) HPI Comments: 52 y.o. Peri-menopausal Female with hx of uterine fibroids presents today complaining of bright red vaginal bleeding, heavy with clots over the last 4 days. Pt states she has been peri-menopausal for about 3 years when she experienced heavy vaginal bleeding with clots similar to this episode, but not as severe. At that time, imaging revealed she had uterine fibroids and surgery was recommended. Pt declined surgery at that time. Pt denies fever, abdominal or pelvic pain,dysuria, hematuria, nausea, vomiting, diarrhea, vaginal discharge, night sweats, change in appetite, or unintentional weight loss. Pt states she does not have a gynecologist and hasn't been to one since she declined the surgery.   Review of records show that pt did call her PCP regarding today's sx and they recommended either she see a GYN or go to the ED. Pt opted to come to the ED today.   Patient is a 52 y.o. female presenting with vaginal bleeding.  Vaginal Bleeding Associated symptoms: no abdominal pain, no back pain, no dizziness, no dysuria, no fever, no nausea and no vaginal discharge    Past Medical History  Diagnosis Date  . Rheumatoid arthritis(714.0)   . Depression     counseling in 2010 for 2 months on medication  . GERD (gastroesophageal reflux disease) 2005    treated with omeprazole  . Seasonal allergies     affected in the spring  . Hypertension 2002  . High cholesterol 2002  . History of sinusitis     once/twice per year  . Fibrocystic breast 1988  . OSA (obstructive sleep apnea)     +sleep study 08/2010.  Marland Kitchen Cervical cancer screening 07/20/2012    Menarche at 13, regular til recently No h/o abnormal paps, last  pap 2-3 years ago G0P0  MGM today, no h/o abnormal MGMs Spotting now monthly and scant H/o uterine polyps   Past Surgical History  Procedure Laterality Date  . Tonsillectomy  1969   Family History  Problem Relation Age of Onset  . Osteoarthritis Mother   . Liver cancer Maternal Grandmother   . Hyperlipidemia Mother   . Hyperlipidemia Father   . Coronary artery disease Father   . Hypertension Father   . Stroke Father     multiple  . Depression Mother   . Diabetes Father    History  Substance Use Topics  . Smoking status: Former Games developer  . Smokeless tobacco: Never Used     Comment: Quit in 1991- started in 1983 1/2ppd  . Alcohol Use: Yes     Comment: once yearly   OB History   Grav Para Term Preterm Abortions TAB SAB Ect Mult Living                 Review of Systems  Constitutional: Negative for fever and diaphoresis.  HENT: Negative for neck pain and neck stiffness.   Eyes: Negative for visual disturbance.  Respiratory: Negative for shortness of breath.   Cardiovascular: Negative for chest pain and palpitations.  Gastrointestinal: Negative for nausea, vomiting, abdominal pain, diarrhea and constipation.  Genitourinary: Positive for vaginal bleeding. Negative for dysuria, urgency, frequency, hematuria, flank pain, vaginal discharge, vaginal pain and pelvic pain.  Musculoskeletal: Negative for back pain and gait problem.  Skin: Negative for rash.  Neurological: Negative for dizziness, weakness, light-headedness, numbness and headaches.    Allergies  Review of patient's allergies indicates no known allergies.  Home Medications   Current Outpatient Rx  Name  Route  Sig  Dispense  Refill  . traMADol (ULTRAM) 50 MG tablet   Oral   Take 50 mg by mouth every 6 (six) hours as needed for pain.         Marland Kitchen amLODipine (NORVASC) 5 MG tablet      take 1 tablet by mouth daily   30 tablet   3   . atorvastatin (LIPITOR) 20 MG tablet      take 1 tablet by mouth once  daily   30 tablet   3   . bisoprolol-hydrochlorothiazide (ZIAC) 5-6.25 MG per tablet      take 2 tablets by mouth once daily   60 tablet   4   . buPROPion (WELLBUTRIN SR) 150 MG 12 hr tablet      take 1 tablet by mouth twice a day   60 tablet   6   . Cholecalciferol (VITAMIN D3) 1000 UNITS CAPS   Oral   Take 1 capsule by mouth daily.           . citalopram (CELEXA) 20 MG tablet   Oral   Take 1 tablet (20 mg total) by mouth daily.   30 tablet   6   . cyclobenzaprine (FLEXERIL) 10 MG tablet      take 1 tablet by mouth daily if needed   30 tablet   3   . LORazepam (ATIVAN) 1 MG tablet   Oral   Take 0.5 tablets (0.5 mg total) by mouth at bedtime as needed for anxiety.   30 tablet   1   . Multiple Vitamin (MULTIVITAMIN) tablet   Oral   Take 1 tablet by mouth daily.           . naproxen sodium (ANAPROX) 220 MG tablet   Oral   Take 440 mg by mouth 2 (two) times daily with a meal.          . Omega-3 Fatty Acids (FISH OIL) 1200 MG CAPS   Oral   Take 1,200 mg by mouth daily.           Marland Kitchen EXPIRED: omeprazole (PRILOSEC) 20 MG capsule   Oral   Take 1 capsule (20 mg total) by mouth daily.   30 capsule   5    BP 159/84  Pulse 86  Temp(Src) 97.7 F (36.5 C) (Oral)  Resp 16  Ht 5' 4.5" (1.638 m)  Wt 243 lb (110.224 kg)  BMI 41.08 kg/m2  SpO2 100% Physical Exam  Nursing note and vitals reviewed. Constitutional: She is oriented to person, place, and time. She appears well-developed and well-nourished. No distress.  HENT:  Head: Normocephalic and atraumatic.  Eyes: Conjunctivae and EOM are normal.  Neck: Normal range of motion. Neck supple.  Cardiovascular: Normal rate, regular rhythm, normal heart sounds and intact distal pulses.   Pulmonary/Chest: Effort normal and breath sounds normal. No respiratory distress.  Abdominal: Soft. Bowel sounds are normal.  Genitourinary:  No external lesions. Blood in vaginal canal. No CMT. No adnexal tenderness.  Chaperone present.   Musculoskeletal: Normal range of motion. She exhibits no edema and no tenderness.  Neurological: She is alert and oriented to person, place, and time. No cranial nerve deficit.  Skin: Skin is warm and dry. No rash noted. She is not diaphoretic.  Psychiatric: She has a normal mood and affect.    ED Course  Procedures (including critical care time) Labs Reviewed  BASIC METABOLIC PANEL - Abnormal; Notable for the following:    Glucose, Bld 102 (*)    GFR calc non Af Amer 84 (*)    All other components within normal limits  URINALYSIS, ROUTINE W REFLEX MICROSCOPIC - Abnormal; Notable for the following:    Hgb urine dipstick LARGE (*)    All other components within normal limits  WET PREP, GENITAL  GC/CHLAMYDIA PROBE AMP  CBC WITH DIFFERENTIAL  URINE MICROSCOPIC-ADD ON   No results found. 1. Vaginal bleeding     MDM  Abdominal and pelvic exam were benign. Lab work was unconcerning. Discussed with pt that there does not appear any acute or emergent interventions needed today. Pt understands and agrees that a GYN follow up is appropriate. Discussed pt case with Dr. Anitra Lauth who is in agreement with plan. Discussed reasons to seek immediate care. Patient expresses understanding and agrees with plan.   Glade Nurse, PA-C 09/12/12 1718

## 2012-09-12 NOTE — Telephone Encounter (Signed)
Patient Information:  Caller Name: Skya  Phone: [phone]  Patient: Alejandra Miller, Alejandra Miller  Gender: Female  DOB: [birth year]  Age: 52 Years  PCP: Danise Edge [facility])  Pregnant: No  Office Follow Up:  Does the office need to follow up with this patient?: No  Instructions For The Office: N/A  RN Note:  Afebrile/Menses currently. Onset 07/10/2012 of large volume of bleeding changing tampon and large pad every 20 minutes with clots. She has had irregular periods for the last 2 years, hot flashes but this is the largest volume of blood which is concerning to her. RN/CAN advised ER, called "Wilkie Aye" nurse at office who confirmed ER disposition or GYN. RN/CAN advised ER and not to drive if she can get a ride. She is goig to try the ER, did not know the name of it specifically and is in no outward distress.  Symptoms  Reason For Call & Symptoms: premenopausal and heavy bleeding/clots  Reviewed Health History In EMR: Yes  Reviewed Medications In EMR: Yes  Reviewed Allergies In EMR: Yes  Reviewed Surgeries / Procedures: Yes  Date of Onset of Symptoms: 09/09/2012  Treatments Tried: observing  Treatments Tried Worked: No OB / GYN:  LMP: 09/12/2012  Guideline(s) Used:  Menstrual Period - Missed or Late  Vaginal Bleeding - Abnormal  Disposition Per Guideline:   Go to ED Now (or to Office with PCP Approval)  Reason For Disposition Reached:   Severe vaginal bleeding (i.e., soaking 2 pads or tampons per hour and present 2 or more hours)  Advice Given:  Call Back If:  Bleeding becomes worse  You become worse.  Patient Will Follow Care Advice:  YES

## 2012-09-12 NOTE — ED Provider Notes (Signed)
Medical screening examination/treatment/procedure(s) were performed by non-physician practitioner and as supervising physician I was immediately available for consultation/collaboration.   Magin Balbi, MD 09/12/12 1846 

## 2012-09-12 NOTE — ED Notes (Signed)
Pt c/o vaginal bleeding with clots x 4 days.

## 2012-09-13 LAB — GC/CHLAMYDIA PROBE AMP
CT Probe RNA: NEGATIVE
GC Probe RNA: NEGATIVE

## 2012-09-21 ENCOUNTER — Ambulatory Visit (INDEPENDENT_AMBULATORY_CARE_PROVIDER_SITE_OTHER): Payer: Managed Care, Other (non HMO) | Admitting: Obstetrics and Gynecology

## 2012-09-21 ENCOUNTER — Encounter: Payer: Self-pay | Admitting: Obstetrics and Gynecology

## 2012-09-21 VITALS — BP 118/78 | HR 70 | Ht 64.0 in | Wt 246.0 lb

## 2012-09-21 DIAGNOSIS — N921 Excessive and frequent menstruation with irregular cycle: Secondary | ICD-10-CM

## 2012-09-21 DIAGNOSIS — N920 Excessive and frequent menstruation with regular cycle: Secondary | ICD-10-CM

## 2012-09-21 MED ORDER — MEDROXYPROGESTERONE ACETATE 10 MG PO TABS: 10.0000 mg | ORAL_TABLET | Freq: Every day | ORAL | Status: DC

## 2012-09-21 NOTE — Patient Instructions (Signed)
Menorrhagia Dysfunctional uterine bleeding is different from a normal menstrual period. When periods are heavy or there is more bleeding than is usual for you, it is called menorrhagia. It may be caused by hormonal imbalance, or physical, metabolic, or other problems. Examination is necessary in order that your caregiver may treat treatable causes. If this is a continuing problem, a D&C may be needed. That means that the cervix (the opening of the uterus or womb) is dilated (stretched larger) and the lining of the uterus is scraped out. The tissue scraped out is then examined under a microscope by a specialist (pathologist) to make sure there is nothing of concern that needs further or more extensive treatment. HOME CARE INSTRUCTIONS   If medications were prescribed, take exactly as directed. Do not change or switch medications without consulting your caregiver.  Long term heavy bleeding may result in iron deficiency. Your caregiver may have prescribed iron pills. They help replace the iron your body lost from heavy bleeding. Take exactly as directed. Iron may cause constipation. If this becomes a problem, increase the bran, fruits, and roughage in your diet.  Do not take aspirin or medicines that contain aspirin one week before or during your menstrual period. Aspirin may make the bleeding worse.  If you need to change your sanitary pad or tampon more than once every 2 hours, stay in bed and rest as much as possible until the bleeding stops.  Eat well-balanced meals. Eat foods high in iron. Examples are leafy green vegetables, meat, liver, eggs, and whole grain breads and cereals. Do not try to lose weight until the abnormal bleeding has stopped and your blood iron level is back to normal. SEEK MEDICAL CARE IF:   You need to change your sanitary pad or tampon more than once an hour.  You develop nausea (feeling sick to your stomach) and vomiting, dizziness, or diarrhea while you are taking your  medicine.  You have any problems that may be related to the medicine you are taking. SEEK IMMEDIATE MEDICAL CARE IF:   You have a fever.  You develop chills.  You develop severe bleeding or start to pass blood clots.  You feel dizzy or faint. MAKE SURE YOU:   Understand these instructions.  Will watch your condition.  Will get help right away if you are not doing well or get worse. Document Released: 02/15/2005 Document Revised: 05/10/2011 Document Reviewed: 10/06/2007 [facility] Patient Information 2014 [place], [place].  Metrorrhagia  Metrorrhagia is uterine bleeding at irregular intervals, especially between menstrual periods.  CAUSES   Dysfunctional uterine bleeding.  Uterine lining growing outside the uterus (endometriosis).  Embryo adhering to uterine wall (implantation).  Pregnancy growing in the fallopian tubes (ectopic pregnancy).  Miscarriage.  Menopause.  Cancer of the reproduction organs.  Certain drugs such as hormonal contraceptives.  Inherited bleeding disorders.  Trauma.  Uterine fibroids.  Sexually transmitted diseases (STDs).  Polycystic ovarian disease. DIAGNOSIS  A history will be taken.  A physical exam will be performed.  Other tests may include:  Blood tests.  A pregnancy test.  An ultrasound of the abdomen and pelvis.  A biopsy of the uterine lining.  AMRI or CT scan of the abdomen and pelvis. TREATMENT Treatment will depend on the cause. HOME CARE INSTRUCTIONS   Take all medicines as directed by your caregiver. Do not change or switch medicines without talking to your caregiver.  Take all iron supplements exactly as directed by your caregiver. Iron supplements help to replace the  iron your body loses from irregular bleeding.If you become constipated, increase the amount of fiber, fruits, and vegetables in your diet.  Do not take aspirin or medicines that contain aspirin for 1 week before your menstrual period or  during your menstrual period. Aspirin may increase the bleeding.  Rest as much as possible if you change your sanitary pad or tampon more than once every 2 hours.  Eat well-balanced meals including foods high in iron, such as green leafy vegetables, red meat, liver, eggs, and whole-grain breads and cereals.  Do not try to lose weight until the abnormal bleeding is controlled and your blood iron level is back to normal. SEEK MEDICAL CARE IF:   You have nausea and vomiting, or you cannot keep foods down.  You feel dizzy or have diarrhea while taking medicine.  You have any problems that may be related to the medicine you are taking. SEEK IMMEDIATE MEDICAL CARE IF:   You have a fever.  You develop chills.  You become lightheaded or faint.  You need to change your sanitary pad or tampon more than once an hour.  Your bleeding becomesheavy.  You begin to pass clots or tissue. MAKE SURE YOU:   Understand these instructions.  Will watch your condition.  Will get help right away if you are not doing well or get worse. Document Released: 02/15/2005 Document Revised: 05/10/2011 Document Reviewed: 09/14/2010 [facility] Patient Information 2014 [place], [place].  Medroxyprogesterone tablets What is this medicine? MEDROXYPROGESTERONE (me DROX ee proe JES te rone) is a hormone in a class called progestins. It is commonly used to prevent the uterine lining from overgrowth in women taking an estrogen after menopause. It is also used to treat irregular menstrual bleeding or a lack of menstrual bleeding in women. This medicine may be used for other purposes; ask your health care provider or pharmacist if you have questions. What should I tell my health care provider before I take this medicine? They need to know if you have any of these conditions: -blood vessel disease or a history of a blood clot in the lungs or legs -breast, cervical or vaginal cancer -heart disease -kidney  disease -liver disease -migraine -recent miscarriage or abortion -mental depression -migraine -seizures (convulsions) -stroke -vaginal bleeding that has not been evaluated -an unusual or allergic reaction to medroxyprogesterone, other medicines, foods, dyes, or preservatives -pregnant or trying to get pregnant -breast-feeding How should I use this medicine? Take this medicine by mouth with a glass of water. Follow the directions on the prescription label. Take your doses at regular intervals. Do not take your medicine more often than directed. Talk to your pediatrician regarding the use of this medicine in children. Special care may be needed. While this drug may be prescribed for children as young as 13 years for selected conditions, precautions do apply. Overdosage: If you think you have taken too much of this medicine contact a poison control center or emergency room at once. NOTE: This medicine is only for you. Do not share this medicine with others. What if I miss a dose? If you miss a dose, take it as soon as you can. If it is almost time for your next dose, take only that dose. Do not take double or extra doses. What may interact with this medicine? -barbiturate medicines for inducing sleep or treating seizures (convulsions) -bosentan -carbamazepine -phenytoin -rifampin -St. John's Wort This list may not describe all possible interactions. Give your health care provider a list of all  the medicines, herbs, non-prescription drugs, or dietary supplements you use. Also tell them if you smoke, drink alcohol, or use illegal drugs. Some items may interact with your medicine. What should I watch for while using this medicine? Visit your health care professional for regular checks on your progress. You will need a regular breast and pelvic exam. If you have any reason to think you are pregnant, stop taking this medicine at once and contact your doctor or health care professional. What  side effects may I notice from receiving this medicine? Side effects that you should report to your doctor or health care professional as soon as possible: -breast tenderness or discharge -changes in mood or emotions, such as depression -changes in vision or speech -pain in the abdomen, chest, groin, or leg -severe headache -skin rash, itching, or hives -sudden shortness of breath -unusually weak or tired -yellowing of skin or eyes Side effects that usually do not require medical attention (report to your doctor or health care professional if they continue or are bothersome): -acne -change in menstrual bleeding pattern or flow -changes in sexual desire -facial hair growth -fluid retention and swelling -headache -upset stomach -weight gain or loss This list may not describe all possible side effects. Call your doctor for medical advice about side effects. You may report side effects to FDA at 1-800-FDA-1088. Where should I keep my medicine? Keep out of the reach of children. Store at room temperature between 20 and 25 degrees C (68 and 77 degrees F). Throw away any unused medicine after the expiration date. NOTE: This sheet is a summary. It may not cover all possible information. If you have questions about this medicine, talk to your doctor, pharmacist, or health care provider.  2013, [employer]. (02/15/2008 11:26:12 AM)

## 2012-09-21 NOTE — Progress Notes (Signed)
52 y.o.   Married    Caucasian   female   G0P0   here for  Menstrual bleeding abnormality  Light cyctes with spotting for 6 - 8 months, the last half of 2013 to the beginning of 2014. Prior to this they were normal. Bled from 09/02/12 and been bleeding ever since.  Clots falling into the toilet.   Went to the emergency department 09/12/12 for evaluation.  Hgb then 13.5.  Had TSH 0.922 in may 2014 by her PCP.    Stopped Combined OCPS 2 -3 years ago, and then cycles became irregular. Stopped OCPS to find another form of contraception with less risk. Tried multiple forms of contraception which were not successful. Had Essure.  Left side successful, right was not possible to do. So then had a Mirena and this became malpositioned so she had it removed.  Husband had a vasectomy.    History of uterine polyps.  Had a heavy episode of heavy bleeding a few years ago and had a sonohysterogram which indentified two to three polyps. They were not removed.   History of lower extremity after a fall and patient had a doppler ultrasound to rule out a DVT in 2013.  Patient states she has a diagnosis of osteoarthritis, not rheumatoid arthritis.    Patient's last menstrual period was 09/02/2012.          Sexually active: yes  The current method of family planning is vasectomy.    Exercising:  walking Last mammogram:  Jul 20, 2012 - normal - [facility] Last pap smear:   May 2014 - normal History of abnormal pap:  no Smoking: no Alcohol: no Last colonoscopy: no Last Bone Density:  never Last tetanus shot:  2004 Last cholesterol check:  2014 - normal  UPT - negative   Family History  Problem Relation Age of Onset  . Osteoarthritis Mother   . Liver cancer Maternal Grandmother   . Hyperlipidemia Mother   . Hyperlipidemia Father   . Coronary artery disease Father   . Hypertension Father   . Stroke Father     multiple  . Depression Mother   . Diabetes Father     Patient Active Problem  List   Diagnosis Date Noted  . Cervical cancer screening 07/20/2012  . Sinusitis 02/12/2012  . Obesity 02/12/2012  . Leg swelling 12/08/2011  . Annual physical exam 07/28/2011  . Menstrual irregularity 12/15/2010  . Vitamin D deficiency 12/15/2010  . OSA (obstructive sleep apnea) 08/11/2010  . Arthralgia 07/28/2010  . Hyperlipidemia 07/28/2010  . Hypertension 07/28/2010  . Depression 07/28/2010    Past Medical History  Diagnosis Date  . Rheumatoid arthritis(714.0)   . Depression     counseling in 2010 for 2 months on medication  . GERD (gastroesophageal reflux disease) 2005    treated with omeprazole  . Seasonal allergies     affected in the spring  . Hypertension 2002  . High cholesterol 2002  . History of sinusitis     once/twice per year  . Fibrocystic breast 1988  . OSA (obstructive sleep apnea)     +sleep study 08/2010.  Marland Kitchen Cervical cancer screening 07/20/2012    Menarche at 13, regular til recently No h/o abnormal paps, last pap 2-3 years ago G0P0  MGM today, no h/o abnormal MGMs Spotting now monthly and scant H/o uterine polyps  . Uterine polyp     Past Surgical History  Procedure Laterality Date  . Tonsillectomy  1969  .  Wrist fracture surgery Right 07/2012    Allergies: Review of patient's allergies indicates no known allergies.  Current Outpatient Prescriptions  Medication Sig Dispense Refill  . amLODipine (NORVASC) 5 MG tablet take 1 tablet by mouth daily  30 tablet  3  . atorvastatin (LIPITOR) 20 MG tablet take 1 tablet by mouth once daily  30 tablet  3  . bisoprolol-hydrochlorothiazide (ZIAC) 5-6.25 MG per tablet take 2 tablets by mouth once daily  60 tablet  4  . buPROPion (WELLBUTRIN SR) 150 MG 12 hr tablet take 1 tablet by mouth twice a day  60 tablet  6  . Cholecalciferol (VITAMIN D3) 1000 UNITS CAPS Take 1 capsule by mouth daily.        . citalopram (CELEXA) 20 MG tablet Take 1 tablet (20 mg total) by mouth daily.  30 tablet  6  . cyclobenzaprine  (FLEXERIL) 10 MG tablet take 1 tablet by mouth daily if needed  30 tablet  3  . Multiple Vitamin (MULTIVITAMIN) tablet Take 1 tablet by mouth daily.        . naproxen sodium (ANAPROX) 220 MG tablet Take 440 mg by mouth 2 (two) times daily with a meal.       . Omega-3 Fatty Acids (FISH OIL) 1200 MG CAPS Take 1,200 mg by mouth daily.        . traMADol (ULTRAM) 50 MG tablet Take 50 mg by mouth every 6 (six) hours as needed for pain.      Marland Kitchen LORazepam (ATIVAN) 1 MG tablet Take 0.5 tablets (0.5 mg total) by mouth at bedtime as needed for anxiety.  30 tablet  1  . omeprazole (PRILOSEC) 20 MG capsule Take 1 capsule (20 mg total) by mouth daily.  30 capsule  5   No current facility-administered medications for this visit.    ROS: Pertinent items are noted in HPI.  Social Hx:  Married.   Exam:    BP 118/78  Pulse 70  Ht 5\' 4"  (1.626 m)  Wt 246 lb (111.585 kg)  BMI 42.21 kg/m2  LMP 09/02/2012   Wt Readings from Last 3 Encounters:  09/21/12 246 lb (111.585 kg)  09/12/12 243 lb (110.224 kg)  07/20/12 251 lb 1.3 oz (113.889 kg)     Ht Readings from Last 3 Encounters:  09/21/12 5\' 4"  (1.626 m)  09/12/12 5' 4.5" (1.638 m)  07/20/12 5' 3.5" (1.613 m)    General appearance: alert, cooperative and appears stated age Lungs: clear to auscultation bilaterally Breasts: Inspection negative, No nipple retraction or dimpling, No nipple discharge or bleeding, No axillary or supraclavicular adenopathy, Normal to palpation without dominant masses Heart: regular rate and rhythm Abdomen: obese, soft, non-tender; no masses,  no organomegaly Extremities:  Wrap brace on right forearm. No abnormal inguinal nodes palpated Neurologic: Grossly normal   Pelvic: External genitalia:  no lesions              Urethra:  normal appearing urethra with no masses, tenderness or lesions              Bartholins and Skenes: normal                 Vagina: normal appearing vagina with normal color and discharge, no  lesions.  Dark red blood in vaginal vault.  No active bleeding.              Cervix: normal appearance  Bimanual Exam:  Uterus:  uterus is normal size, shape, consistency and nontender (Exam somewhat limited by body habitus.)                                      Adnexa: normal adnexa in size, nontender and no masses                                         Assessment  Perimenopausal female Menometrorrhagia History of endometrial polyps Obesity and at risk for endometrial hyperplasia  Plan  Provera 10 mg po daily for 14 days. Return for pelvic ultrasound, sonohysterogram, and endometrial biopsy.  An After Visit Summary was printed and given to the patient.

## 2012-09-27 ENCOUNTER — Other Ambulatory Visit: Payer: Self-pay | Admitting: Obstetrics and Gynecology

## 2012-09-27 ENCOUNTER — Ambulatory Visit (INDEPENDENT_AMBULATORY_CARE_PROVIDER_SITE_OTHER): Payer: Managed Care, Other (non HMO)

## 2012-09-27 ENCOUNTER — Ambulatory Visit: Payer: Managed Care, Other (non HMO) | Admitting: Family Medicine

## 2012-09-27 ENCOUNTER — Encounter: Payer: Self-pay | Admitting: Obstetrics and Gynecology

## 2012-09-27 ENCOUNTER — Ambulatory Visit (INDEPENDENT_AMBULATORY_CARE_PROVIDER_SITE_OTHER): Payer: Managed Care, Other (non HMO) | Admitting: Obstetrics and Gynecology

## 2012-09-27 VITALS — BP 130/82 | HR 70 | Ht 64.0 in | Wt 247.5 lb

## 2012-09-27 DIAGNOSIS — N921 Excessive and frequent menstruation with irregular cycle: Secondary | ICD-10-CM

## 2012-09-27 DIAGNOSIS — N920 Excessive and frequent menstruation with regular cycle: Secondary | ICD-10-CM

## 2012-09-27 DIAGNOSIS — R9389 Abnormal findings on diagnostic imaging of other specified body structures: Secondary | ICD-10-CM

## 2012-09-27 NOTE — Patient Instructions (Addendum)
Hysteroscopy Hysteroscopy is a procedure used for looking inside the womb (uterus). It may be done for many different reasons, including:  To evaluate abnormal bleeding, fibroid (benign, noncancerous) tumors, polyps, scar tissue (adhesions), and possibly cancer of the uterus.  To look for lumps (tumors) and other uterine growths.  To look for causes of why a woman cannot get pregnant (infertility), causes of recurrent loss of pregnancy (miscarriages), or a lost intrauterine device (IUD).  To perform a sterilization by blocking the fallopian tubes from inside the uterus. A hysteroscopy should be done right after a menstrual period to be sure you are not pregnant. LET YOUR CAREGIVER KNOW ABOUT:   Allergies.  Medicines taken, including herbs, eyedrops, over-the-counter medicines, and creams.  Use of steroids (by mouth or creams).  Previous problems with anesthetics or numbing medicines.  History of bleeding or blood problems.  History of blood clots.  Possibility of pregnancy, if this applies.  Previous surgery.  Other health problems. RISKS AND COMPLICATIONS   Putting a hole in the uterus.  Excessive bleeding.  Infection.  Damage to the cervix.  Injury to other organs.  Allergic reaction to medicines.  Too much fluid used in the uterus for the procedure. BEFORE THE PROCEDURE   Do not take aspirin or blood thinners for a week before the procedure, or as directed. It can cause bleeding.  Arrive at least 60 minutes before the procedure or as directed to read and sign the necessary forms.  Arrange for someone to take you home after the procedure.  If you smoke, do not smoke for 2 weeks before the procedure. PROCEDURE   Your caregiver may give you medicine to relax you. He or she may also give you a medicine that numbs the area around the cervix (local anesthetic) or a medicine that makes you sleep (general anesthesia).  Sometimes, a medicine is placed in the cervix  the day before the procedure. This medicine makes the cervix have a larger opening (dilate). This makes it easier for the instrument to be inserted into the uterus.  A small instrument (hysteroscope) is inserted through the vagina into the uterus. This instrument is similar to a pencil-sized telescope with a light.  During the procedure, air or a liquid is put into the uterus, which allows the surgeon to see better.  Sometimes, tissue is gently scraped from inside the uterus. These tissue samples are sent to a specialist who looks at tissue samples (pathologist). The pathologist will give a report to your caregiver. This will help your caregiver decide if further treatment is necessary. The report will also help your caregiver decide on the best treatment if the test comes back abnormal. AFTER THE PROCEDURE   If you had a general anesthetic, you may be groggy for a couple hours after the procedure.  If you had a local anesthetic, you will be advised to rest at the surgical center or caregiver's office until you are stable and feel ready to go home.  You may have some cramping for a couple days.  You may have bleeding, which varies from light spotting for a few days to menstrual-like bleeding for up to 3 to 7 days. This is normal.  Have someone take you home. FINDING OUT THE RESULTS OF YOUR TEST Not all test results are available during your visit. If your test results are not back during the visit, make an appointment with your caregiver to find out the results. Do not assume everything is normal if you   have not heard from your caregiver or the medical facility. It is important for you to follow up on all of your test results. HOME CARE INSTRUCTIONS   Do not drive for 24 hours or as instructed.  Only take over-the-counter or prescription medicines for pain, discomfort, or fever as directed by your caregiver.  Do not take aspirin. It can cause or aggravate bleeding.  Do not drive or drink  alcohol while taking pain medicine.  You may resume your usual diet.  Do not use tampons, douche, or have sexual intercourse for 2 weeks, or as advised by your caregiver.  Rest and sleep for the first 24 to 48 hours.  Take your temperature twice a day for 4 to 5 days. Write it down. Give these temperatures to your caregiver if they are abnormal (above 98.6 F or 37.0 C).  Take medicines your caregiver has ordered as directed.  Follow your caregiver's advice regarding diet, exercise, lifting, driving, and general activities.  Take showers instead of baths for 2 weeks, or as recommended by your caregiver.  If you develop constipation:  Take a mild laxative with the advice of your caregiver.  Eat bran foods.  Drink enough water and fluids to keep your urine clear or pale yellow.  Try to have someone with you or available to you for the first 24 to 48 hours, especially if you had a general anesthetic.  Make sure you and your family understand everything about your operation and recovery.  Follow your caregiver's advice regarding follow-up appointments and Pap smears. SEEK MEDICAL CARE IF:   You feel dizzy or lightheaded.  You feel sick to your stomach (nauseous).  You develop abnormal vaginal discharge.  You develop a rash.  You have an abnormal reaction or allergy to your medicine.  You need stronger pain medicine. SEEK IMMEDIATE MEDICAL CARE IF:   Bleeding is heavier than a normal menstrual period or you have blood clots.  You have an oral temperature above 102 F (38.9 C), not controlled by medicine.  You have increasing cramps or pains not relieved with medicine.  You develop belly (abdominal) pain that does not seem to be related to the same area of earlier cramping and pain.  You pass out.  You develop pain in the tops of your shoulders (shoulder strap areas).  You develop shortness of breath. MAKE SURE YOU:   Understand these instructions.  Will watch  your condition.  Will get help right away if you are not doing well or get worse. Document Released: 05/24/2000 Document Revised: 05/10/2011 Document Reviewed: 09/16/2008 [facility] Patient Information 2014 [place], [place].  Dilation and Curettage or Vacuum Curettage Dilation and curettage (D&C) and vacuum curettage are minor procedures. A D&C involves stretching (dilation) the cervix and scraping (curettage) the inside lining of the womb (uterus). During a D&C, tissue is gently scraped from the inside lining of the uterus. During a vacuum curettage, the lining and tissue in the uterus are removed with the use of gentle suction. Curettage may be performed for diagnostic or therapeutic purposes. As a diagnostic procedure, curettage is performed for the purpose of examining tissues from the uterus. Tissue examination may help determine causes or treatment options for symptoms. A diagnostic curettage may be performed for the following symptoms:  Irregular bleeding in the uterus.  Bleeding with the development of clots.  Spotting between menstrual periods.  Prolonged menstrual periods.  Bleeding after menopause.  No menstrual period (amenorrhea).  A change in size and shape  of the uterus. A therapeutic curettage is performed to remove tissue, blood, or a contraceptive device. Therapeutic curettage may be performed for the following conditions:   Removal of an IUD (intrauterine device).  Removal of retained placenta after giving birth. Retained placenta can cause bleeding severe enough to require transfusions or an infection.  Abortion.  Miscarriage.  Removal of polyps inside the uterus.  Removal of uncommon types of fibroids (noncancerous lumps). LET YOUR CAREGIVER KNOW ABOUT:   Allergies to food or medicine.  Medicines taken, including vitamins, herbs, eyedrops, over-the-counter medicines, and creams.  Use of steroids (by mouth or creams).  Previous problems with anesthetics or  numbing medicines.  History of bleeding problems or blood clots.  Previous surgery.  Other health problems, including diabetes and kidney problems.  Possibility of pregnancy, if this applies. RISKS AND COMPLICATIONS   Excessive bleeding.  Infection of the uterus.  Damage to the cervix.  Development of scar tissue (adhesions) inside the uterus, later causing abnormal amounts of menstrual bleeding.  Complications from the general anesthetic, if a general anesthetic is used.  Putting a hole (perforation) in the uterus. This is rare. BEFORE THE PROCEDURE   Eat and drink before the procedure only as directed by your caregiver.  Arrange for someone to take you home. PROCEDURE   This procedure may be done in a hospital, outpatient clinic, or caregiver's office.  You may be given a general anesthetic or a local anesthetic in and around the cervix.  You will lie on your back with your legs in stirrups.  There are two ways in which your cervix can be softened and dilated. These include:  Taking a medicine.  Having thin rods (laminaria) inserted into your cervix.  A curved tool (curette) will scrape cells from the inside lining of the uterus and will then be removed. This procedure usually takes about 15 to 30 minutes. AFTER THE PROCEDURE   You will rest in the recovery area until you are stable and are ready to go home.  You will need to have someone take you home.  You may feel sick to your stomach (nauseous) or throw up (vomit) if you had general anesthesia.  You may have a sore throat if a tube was placed in your throat during general anesthesia.  You may have light cramping and bleeding for 2 days to 2 weeks after the procedure.  Your uterus needs to make a new lining after the procedure. This may make your next period late. Document Released: 02/15/2005 Document Revised: 05/10/2011 Document Reviewed: 09/13/2008 [facility] Patient Information 2014 [place], [place].

## 2012-09-27 NOTE — Progress Notes (Signed)
Subjective  Patient is here for a pelvic ultrasound, saline ultrasound, and endometrial biopsy.  Has a history of prolonged and heavy uterine bleeding since 09/02/12.    Has a history of endometrial polyps noted on a prior sonohysterogram which were not removed.  History of left sided Essure only.  Patient states that bleeding is improved since taking Provera, prescribed at her office visit on 09/21/12.  Objective  See ultrasound below - thickened endometrium, two small fibroids, and a small right paratubal cyst noted. Left sided Essure noted.     Procedures  Consent for both sonohysterogram and endometrial biopsy.  Sonohysterogram - Speculum placed in the vagina.  Cervix sterilized with betadine.  Sonohysterogram canula placed into the cervix without difficulty.  Speculum removed.  Saline fluid injected without difficulty.  Three endomerial masses noted, suspicious for polyps.  Endometrial biopsy - Speculum replaced.  Pipelle passed twice and minimal tissue obtained.  Tenaculum placed on anterior cervical lip and Novak suction curette used to obtain endometrial tissue.  Tissue to pathology.  No complications.  Minimal bleeding.  Husband present for discussion part of the consultation today.   Assessment  Menometrorrhagia History of prior endometrial polyps. Endometrial masses suspicious for polyps now. Small intramural uterine fibroids. Small paratubal cyst.   Plan  Follow up endometrial biopsy. Plan for probable hysteroscopy with polypectomy and dilation and curettage.  Risks, benefits, and alternatives discussed. Risks included but were not limited to bleeding, infection, uterine perforation, pulmonary edema, hyponatremia, reaction to anesthesia, recurrence of polyps.

## 2012-10-02 LAB — IPS CERVICAL/ECC/EMB/VULVAR/VAGINAL BIOPSY

## 2012-10-03 ENCOUNTER — Telehealth: Payer: Self-pay | Admitting: Obstetrics and Gynecology

## 2012-10-03 NOTE — Telephone Encounter (Signed)
Called patient to let her know that since she currently has pending pathology results the surgery has not been ordered. I assured her that once i receive an order for surgery I will pre-cert it and call her with her benefits. Patient agreed.

## 2012-10-03 NOTE — Telephone Encounter (Signed)
Patient calling about her up coming surgery . [facility] -Hysteroscopy . Wants to know what her out of pocket cost would be?

## 2012-10-04 ENCOUNTER — Telehealth: Payer: Self-pay | Admitting: *Deleted

## 2012-10-04 NOTE — Telephone Encounter (Signed)
Message copied by Alisa Graff on Wed Oct 04, 2012  5:50 PM ------      Message from: Conley Simmonds      Created: Wed Oct 04, 2012 10:18 AM       Biopsy results back and released to patient through My Chart.            PLEASE ALSO CONTACT THE PATIENT WITH RESULTS.  The cannula for the biopsy appears to have slipped past the poly areas and therefore the biopsy was negative.            I do recommend proceeding with a hysteroscopy with polypectomy and dilation and curettage.            Diagnosis is endometrial thickening.            Precert will need to be performed.            Thanks to all! ------

## 2012-10-04 NOTE — Telephone Encounter (Signed)
LMTCB.  Patient spoke to [facility] earlier today regarding OOP cost for surgery and asked to schedule in Oct or November.    Dr Edward Jolly,  Is it ok for her to wait till then or should I encourage her to have this done ASAP?

## 2012-10-04 NOTE — Telephone Encounter (Signed)
I would recommend sooner as I do not really have a biopsy result that I believe is adequate.  I would much prefer to proceed with surgery in August or September.

## 2012-10-05 ENCOUNTER — Other Ambulatory Visit: Payer: Self-pay | Admitting: Obstetrics and Gynecology

## 2012-10-05 ENCOUNTER — Other Ambulatory Visit: Payer: Self-pay | Admitting: Family Medicine

## 2012-10-05 NOTE — Telephone Encounter (Signed)
Patient notified of Dr Rica Records instruction.  Patient concerned about potential for heavy bleeding. Was previously told to go to ER for excessive bleeding due to pad change Q 20 mins.  Confirmed for patient to call office first, always someone on call but that if emergency care needed go to MAU at [facility].  Due to polyps, potential for recurrent heavy bleeding until removal.  Patient requests to try to schedule on a Thursday or Friday due to work issues and needs to wait till end of September.  Discussed 11-24-12.  Will schedule and call her back.

## 2012-10-05 NOTE — Telephone Encounter (Signed)
Please advise refill? Pt cancelled 07-25-12 and 09-27-12 appt and no other ones have been made?

## 2012-10-05 NOTE — Telephone Encounter (Signed)
Returned your phone call about scheduling surgery.

## 2012-10-05 NOTE — Telephone Encounter (Signed)
[email or web address]

## 2012-10-05 NOTE — Telephone Encounter (Signed)
I do not anticipate continuing the patient indefinitely on the Provera.  This was to try to stop immediate bleeding.  At the end of taking the provera, the patient will have a withdrawal bleed.  Polyps will not usually respond to hormonal therapy to control them.

## 2012-10-05 NOTE — Telephone Encounter (Signed)
Call back to patient to discuss dates. LMTCB.

## 2012-10-05 NOTE — Telephone Encounter (Signed)
She has had to start seeing Gyn due ot her vaginal bleeding so she has been getting checked there. Ok to let her have 3 more months of her bp meds but after that it will be past the 72month mark since we saw her and we should see her again

## 2012-10-05 NOTE — Telephone Encounter (Signed)
Patient returning call.  Discussed Dr Rica Records recommendation to have surgery in Aug or Sept instead of delay till Nov due to need to rule out abnormal, precancerous or even cancerous cells.  Also advised delay in diagnosis can result in potential for more advanced treatment, and negative prognosis.  Patient to consider her finances and work schedule and call me back.    Patient is on provera to control bleeding until this procedure done.  Last dose she has will be tomorrow.  Can she continue Provera?  Will need refill [facility].  I told her we would call back after Dr Edward Jolly reviews.

## 2012-10-05 NOTE — Telephone Encounter (Signed)
Please advise #[id number] rf's was @ [facility] 09/21/12

## 2012-10-09 ENCOUNTER — Telehealth: Payer: Self-pay

## 2012-10-09 NOTE — Telephone Encounter (Signed)
Message copied by Alphonsa Overall on Mon Oct 09, 2012 10:30 AM ------      Message from: Conley Simmonds      Created: Wed Oct 04, 2012 10:18 AM       Biopsy results back and released to patient through My Chart.            PLEASE ALSO CONTACT THE PATIENT WITH RESULTS.  The cannula for the biopsy appears to have slipped past the poly areas and therefore the biopsy was negative.            I do recommend proceeding with a hysteroscopy with polypectomy and dilation and curettage.            Diagnosis is endometrial thickening.            Precert will need to be performed.            Thanks to all! ------

## 2012-10-09 NOTE — Telephone Encounter (Signed)
Patient states received results of biopsy through "My Chart" and has spoken to [place] about scheduling procedure.

## 2012-10-09 NOTE — Telephone Encounter (Signed)
LMOVM to call for test results. 

## 2012-10-11 NOTE — Telephone Encounter (Signed)
Patient calling to check on surgery date.  Advised surgery scheduled for 11-24-12, Friday, at 730 at [facility].  Patient declined to schedule any earlier than 11-24-12.  Surgery instructions reviewed/mailed/appointments scheduled.

## 2012-10-20 ENCOUNTER — Telehealth: Payer: Self-pay | Admitting: *Deleted

## 2012-10-20 NOTE — Telephone Encounter (Signed)
Patient to get phone numbers for [facility] preop appt.  She needs to get this scheduled so she can make work arrangements.  Info given as requested.

## 2012-10-29 ENCOUNTER — Other Ambulatory Visit: Payer: Self-pay | Admitting: Family Medicine

## 2012-11-02 ENCOUNTER — Encounter (HOSPITAL_COMMUNITY): Payer: Self-pay | Admitting: Pharmacist

## 2012-11-15 NOTE — Patient Instructions (Addendum)
Your procedure is scheduled on: 11/24/2012  Enter through the Main Entrance of [facility] at: 0600AM  Pick up the phone at the desk and dial [phone].  Call this number if you have problems the morning of surgery: [phone].  Remember: Do NOT eat food: AFTER MIDNIGHT 11/23/2012 Do NOT drink clear liquids after: AFTER MIDNIGHT 11/23/2012 Take these medicines the morning of surgery with a SIP OF WATER:   NORVASC, BISOPROLOL HCTZ, WELLBUTRIN SR  Do NOT wear jewelry, make-up, or nail polish. Do NOT wear lotions, powders, or perfumes.  You may wear deoderant. Do NOT shave for 48 hours prior to surgery. Do NOT bring valuables to the hospital. Contacts, dentures, or bridgework may not be worn into surgery.  Have a responsible adult drive you home from the procedure and stay with you for first 24 hours.  HOME WITH HUSBAND JAMES  CELL [phone]

## 2012-11-16 ENCOUNTER — Other Ambulatory Visit (HOSPITAL_COMMUNITY): Payer: Managed Care, Other (non HMO)

## 2012-11-17 ENCOUNTER — Encounter (HOSPITAL_COMMUNITY): Payer: Self-pay

## 2012-11-17 ENCOUNTER — Other Ambulatory Visit: Payer: Self-pay

## 2012-11-17 ENCOUNTER — Ambulatory Visit (INDEPENDENT_AMBULATORY_CARE_PROVIDER_SITE_OTHER): Payer: Managed Care, Other (non HMO) | Admitting: Obstetrics and Gynecology

## 2012-11-17 ENCOUNTER — Encounter: Payer: Self-pay | Admitting: Obstetrics and Gynecology

## 2012-11-17 ENCOUNTER — Encounter (HOSPITAL_COMMUNITY)
Admission: RE | Admit: 2012-11-17 | Discharge: 2012-11-17 | Disposition: A | Discharge status: Home / Self Care | Payer: Managed Care, Other (non HMO) | Source: Ambulatory Visit | Attending: Obstetrics and Gynecology | Admitting: Obstetrics and Gynecology

## 2012-11-17 VITALS — BP 132/80 | HR 70 | Ht 64.0 in | Wt 250.5 lb

## 2012-11-17 DIAGNOSIS — N926 Irregular menstruation, unspecified: Secondary | ICD-10-CM

## 2012-11-17 DIAGNOSIS — N840 Polyp of corpus uteri: Secondary | ICD-10-CM

## 2012-11-17 DIAGNOSIS — Z01812 Encounter for preprocedural laboratory examination: Secondary | ICD-10-CM | POA: Insufficient documentation

## 2012-11-17 DIAGNOSIS — Z01810 Encounter for preprocedural cardiovascular examination: Secondary | ICD-10-CM | POA: Insufficient documentation

## 2012-11-17 DIAGNOSIS — N939 Abnormal uterine and vaginal bleeding, unspecified: Secondary | ICD-10-CM

## 2012-11-17 DIAGNOSIS — Z01818 Encounter for other preprocedural examination: Secondary | ICD-10-CM | POA: Insufficient documentation

## 2012-11-17 HISTORY — DX: Anxiety disorder, unspecified: F41.9

## 2012-11-17 LAB — BASIC METABOLIC PANEL
BUN: 15 mg/dL (ref 6–23)
CO2: 29 mEq/L (ref 19–32)
Chloride: 101 mEq/L (ref 96–112)
Creatinine, Ser: 0.89 mg/dL (ref 0.50–1.10)
Glucose, Bld: 95 mg/dL (ref 70–99)
Potassium: 4.4 mEq/L (ref 3.5–5.1)

## 2012-11-17 LAB — CBC
HCT: 36.5 % (ref 36.0–46.0)
Hemoglobin: 11.4 g/dL — ABNORMAL LOW (ref 12.0–15.0)
MCV: 81.3 fL (ref 78.0–100.0)
WBC: 6.7 10*3/uL (ref 4.0–10.5)

## 2012-11-17 NOTE — Patient Instructions (Signed)
I will see you the day of surgery! 

## 2012-11-17 NOTE — Progress Notes (Signed)
Patient ID: DAKAYLA DISANTI, female   DOB: 08/20/1960, 52 y.o.   MRN: [id number] GYNECOLOGY PROBLEM VISIT  PCP:  Darrow Bussing, MD  Referring provider:   HPI: 52 y.o.   Married  Caucasian  female   G0P0 with No LMP recorded. Patient is not currently having periods (Reason: Perimenopausal).   here for  Pre-operative visit.  Has a history of prolonged and heavy uterine bleeding in July 2014.  Has a history of endometrial polyps noted on a prior sonohysterogram which were not removed.  History of left sided Essure only.   Took course of Provera which has helped to reduce heavy bleeding.  Ultrasound and sonohysterogram showed thickened endometrium, two small fibroids, and a small right paratubal cyst noted.  Left sided Essure noted as well.  Endometrial biopsy actually did show endometrial polyp and inactive endometrium with focal areas of hyalinizing fibrosis, suggestive of implantation site. No atypia or malignancy noted.  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  Has mupiricin at home and uses this on her skin if she has a sore developS.   GYNECOLOGIC HISTORY: No LMP recorded. Patient is not currently having periods (Reason: Perimenopausal). Sexually active:  yes Partner preference: female Contraception:   vasectomy Menopausal hormone therapy:  DES exposure: no   Blood transfusions: no    Sexually transmitted diseases:  no  GYN Procedures:   Mammogram:  07-20-12 [facility]               Pap:  06/2012 wnl  History of abnormal pap smear:  no   OB History   Grav Para Term Preterm Abortions TAB SAB Ect Mult Living   0                  Family History  Problem Relation Age of Onset  . Osteoarthritis Mother   . Liver cancer Maternal Grandmother   . Hyperlipidemia Mother   . Hyperlipidemia Father   . Coronary artery disease Father   . Hypertension Father   . Stroke Father     multiple  . Depression Mother   . Diabetes Father     Patient Active Problem List   Diagnosis Date Noted   . Cervical cancer screening 07/20/2012  . Sinusitis 02/12/2012  . Obesity 02/12/2012  . Leg swelling 12/08/2011  . Annual physical exam 07/28/2011  . Menstrual irregularity 12/15/2010  . Vitamin D deficiency 12/15/2010  . OSA (obstructive sleep apnea) 08/11/2010  . Arthralgia 07/28/2010  . Hyperlipidemia 07/28/2010  . Hypertension 07/28/2010  . Depression 07/28/2010    Past Medical History  Diagnosis Date  . Depression     counseling in 2010 for 2 months on medication  . GERD (gastroesophageal reflux disease) 2005    treated with omeprazole  . Seasonal allergies     affected in the spring  . Hypertension 2002  . High cholesterol 2002  . History of sinusitis     once/twice per year  . Fibrocystic breast 1988  . Cervical cancer screening 07/20/2012    Menarche at 13, regular til recently No h/o abnormal paps, last pap 2-3 years ago G0P0  MGM today, no h/o abnormal MGMs Spotting now monthly and scant H/o uterine polyps  . Uterine polyp   . Anxiety     NO MEDS  . OSA (obstructive sleep apnea)     +sleep study 08/2010., DOES NOT USE CPAP, USES MOSES MOUTH PIECE  . Rheumatoid arthritis(714.0)     KNEES, HANDS, HIPS  Past Surgical History  Procedure Laterality Date  . Tonsillectomy  1969  . Wrist fracture surgery Right 07/2012  . Tonsillectomy    . Wisdom tooth extraction      ALLERGIES: Review of patient's allergies indicates no known allergies.  Current Outpatient Prescriptions  Medication Sig Dispense Refill  . atorvastatin (LIPITOR) 20 MG tablet take 1 tablet by mouth once daily  30 tablet  3  . bisoprolol-hydrochlorothiazide (ZIAC) 5-6.25 MG per tablet take 2 tablets by mouth once daily  60 tablet  2  . buPROPion (WELLBUTRIN SR) 150 MG 12 hr tablet take 1 tablet by mouth twice a day  60 tablet  6  . Cholecalciferol (VITAMIN D3) 1000 UNITS CAPS Take 1 capsule by mouth daily.        . citalopram (CELEXA) 20 MG tablet Take 1 tablet (20 mg total) by mouth daily.  30  tablet  6  . cyclobenzaprine (FLEXERIL) 10 MG tablet take 1 tablet by mouth daily if needed  30 tablet  3  . Krill Oil 500 MG CAPS Take 500 mg by mouth daily.      . Multiple Vitamin (MULTIVITAMIN) tablet Take 1 tablet by mouth daily.        . traMADol (ULTRAM) 50 MG tablet Take 50 mg by mouth every 12 (twelve) hours as needed for pain.       Marland Kitchen amLODipine (NORVASC) 5 MG tablet take 1 tablet by mouth daily  30 tablet  3  . omeprazole (PRILOSEC) 20 MG capsule Take 1 capsule (20 mg total) by mouth daily.  30 capsule  5   No current facility-administered medications for this visit.     ROS:  Pertinent items are noted in HPI.  SOCIAL HISTORY:  Married.   PHYSICAL EXAMINATION:    BP 132/80  Pulse 70  Ht 5\' 4"  (1.626 m)  Wt 250 lb 8 oz (113.626 kg)  BMI 42.98 kg/m2   Wt Readings from Last 3 Encounters:  11/17/12 250 lb 8 oz (113.626 kg)  11/17/12 245 lb (111.131 kg)  10/20/12 243 lb (110.224 kg)     Ht Readings from Last 3 Encounters:  11/17/12 5\' 4"  (1.626 m)  11/17/12 5\' 4"  (1.626 m)  10/20/12 5\' 4"  (1.626 m)    General appearance: alert, cooperative and appears stated age Head: Normocephalic, without obvious abnormality, atraumatic Neck: no adenopathy, supple, symmetrical, trachea midline and thyroid not enlarged, symmetric, no tenderness/mass/nodules Lungs: clear to auscultation bilaterally Breasts: Inspection negative, No nipple retraction or dimpling, No nipple discharge or bleeding, No axillary or supraclavicular adenopathy, Normal to palpation without dominant masses Heart: regular rate and rhythm Abdomen: obese, soft, non-tender; no masses,  no organomegaly Extremities: extremities normal, atraumatic, no cyanosis or edema Skin: Skin color, texture, turgor normal. No rashes or lesions Lymph nodes: Cervical, supraclavicular, and axillary nodes normal. No abnormal inguinal nodes palpated Neurologic: Grossly normal  Pelvic: External genitalia:  no lesions               Urethra:  normal appearing urethra with no masses, tenderness or lesions              Bartholins and Skenes: normal                 Vagina: normal appearing vagina with normal color and discharge, no lesions              Cervix: normal appearance  Bimanual Exam:  Uterus:  uterus is normal size, shape, consistency and nontender                                      Adnexa: normal adnexa in size, nontender and no masses                                     ASSESSMENT  Abnormal uterine bleeding. Endometrial polyps. Status post left Essure. Hx MRSA.    PLAN  Proceed with hysteroscopic polypectomy, dilation and curettage.  Risks, benefits, and alternatives discussed with patient who wishes to proceed. Risks included but were not limited to bleeding, infection, uterine perforation and subsequent laparoscopy, reaction to anesthesia, DVT, PE, and death.   Will check with the hospital regarding the protocol for the patient who has a history of MRSA infections.   An After Visit Summary was printed and given to the patient.

## 2012-11-20 ENCOUNTER — Telehealth: Payer: Self-pay | Admitting: *Deleted

## 2012-11-20 NOTE — Telephone Encounter (Signed)
Please contact the patient to give her to recommendation of the Mupirocin ointment bid. She stated to me that she has Mupirocin at home already.  Thanks.

## 2012-11-20 NOTE — Telephone Encounter (Signed)
Per Okey Regal in PAT at [facility], they did not see any history of MRSA listed in EPIC and patient did not report this to them.  She said they only prescreen patients that will have an incision.  With patient's history, they said we could prescribe the Mupirocin ointment to the nostrils BID.  They no longer have someone there to prescribe this so this would be what they would do even if she tested positive on a prescreen.

## 2012-11-21 NOTE — Telephone Encounter (Signed)
Patient notified of Dr Rica Records instruction.  She does have medication at home and denies need for refill.  States she is aware to apply to each nostril and hold for 30 seconds BID.

## 2012-11-24 ENCOUNTER — Encounter (HOSPITAL_COMMUNITY): Payer: Self-pay | Admitting: Anesthesiology

## 2012-11-24 ENCOUNTER — Telehealth: Payer: Self-pay | Admitting: Obstetrics and Gynecology

## 2012-11-24 ENCOUNTER — Ambulatory Visit (HOSPITAL_COMMUNITY): Payer: Managed Care, Other (non HMO) | Admitting: Anesthesiology

## 2012-11-24 ENCOUNTER — Encounter (HOSPITAL_COMMUNITY)
Admission: RE | Disposition: A | Discharge status: Home / Self Care | Payer: Self-pay | Source: Ambulatory Visit | Attending: Obstetrics and Gynecology

## 2012-11-24 ENCOUNTER — Ambulatory Visit (HOSPITAL_COMMUNITY)
Admission: RE | Admit: 2012-11-24 | Discharge: 2012-11-24 | Disposition: A | Discharge status: Home / Self Care | Payer: Managed Care, Other (non HMO) | Source: Ambulatory Visit | Attending: Obstetrics and Gynecology | Admitting: Obstetrics and Gynecology

## 2012-11-24 DIAGNOSIS — N840 Polyp of corpus uteri: Secondary | ICD-10-CM | POA: Insufficient documentation

## 2012-11-24 DIAGNOSIS — N949 Unspecified condition associated with female genital organs and menstrual cycle: Secondary | ICD-10-CM | POA: Insufficient documentation

## 2012-11-24 DIAGNOSIS — N939 Abnormal uterine and vaginal bleeding, unspecified: Secondary | ICD-10-CM

## 2012-11-24 DIAGNOSIS — R9389 Abnormal findings on diagnostic imaging of other specified body structures: Secondary | ICD-10-CM | POA: Insufficient documentation

## 2012-11-24 DIAGNOSIS — N925 Other specified irregular menstruation: Secondary | ICD-10-CM | POA: Insufficient documentation

## 2012-11-24 DIAGNOSIS — N926 Irregular menstruation, unspecified: Secondary | ICD-10-CM

## 2012-11-24 DIAGNOSIS — Z8614 Personal history of Methicillin resistant Staphylococcus aureus infection: Secondary | ICD-10-CM | POA: Insufficient documentation

## 2012-11-24 DIAGNOSIS — N838 Other noninflammatory disorders of ovary, fallopian tube and broad ligament: Secondary | ICD-10-CM | POA: Insufficient documentation

## 2012-11-24 DIAGNOSIS — N920 Excessive and frequent menstruation with regular cycle: Secondary | ICD-10-CM

## 2012-11-24 DIAGNOSIS — N938 Other specified abnormal uterine and vaginal bleeding: Secondary | ICD-10-CM | POA: Insufficient documentation

## 2012-11-24 HISTORY — PX: DILATATION & CURRETTAGE/HYSTEROSCOPY WITH RESECTOCOPE: SHX5572

## 2012-11-24 LAB — PREGNANCY, URINE: Preg Test, Ur: NEGATIVE

## 2012-11-24 SURGERY — DILATATION & CURETTAGE/HYSTEROSCOPY WITH RESECTOCOPE
Anesthesia: General | Site: Uterus | Wound class: Clean Contaminated

## 2012-11-24 MED ORDER — LACTATED RINGERS IV SOLN: INTRAVENOUS | Status: DC

## 2012-11-24 MED ORDER — MIDAZOLAM HCL 2 MG/2ML IJ SOLN
INTRAMUSCULAR | Status: AC
  Filled 2012-11-24: qty 2

## 2012-11-24 MED ORDER — GLYCINE 1.5 % IR SOLN
Status: DC | PRN
  Administered 2012-11-24 (×2): 3000 mL

## 2012-11-24 MED ORDER — HYDROMORPHONE HCL PF 1 MG/ML IJ SOLN
INTRAMUSCULAR | Status: AC
  Filled 2012-11-24: qty 1

## 2012-11-24 MED ORDER — LIDOCAINE HCL (CARDIAC) 20 MG/ML IV SOLN
INTRAVENOUS | Status: AC
  Filled 2012-11-24: qty 5

## 2012-11-24 MED ORDER — EPHEDRINE SULFATE 50 MG/ML IJ SOLN
INTRAMUSCULAR | Status: DC | PRN
  Administered 2012-11-24 (×3): 10 mg via INTRAVENOUS

## 2012-11-24 MED ORDER — MEPERIDINE HCL 25 MG/ML IJ SOLN: 6.2500 mg | INTRAMUSCULAR | Status: DC | PRN

## 2012-11-24 MED ORDER — ONDANSETRON HCL 4 MG/2ML IJ SOLN
INTRAMUSCULAR | Status: AC
  Filled 2012-11-24: qty 2

## 2012-11-24 MED ORDER — FENTANYL CITRATE 0.05 MG/ML IJ SOLN
INTRAMUSCULAR | Status: DC | PRN
  Administered 2012-11-24 (×2): 50 ug via INTRAVENOUS

## 2012-11-24 MED ORDER — PROPOFOL 10 MG/ML IV BOLUS
INTRAVENOUS | Status: DC | PRN
  Administered 2012-11-24: 200 mg via INTRAVENOUS

## 2012-11-24 MED ORDER — ONDANSETRON HCL 4 MG/2ML IJ SOLN
INTRAMUSCULAR | Status: DC | PRN
  Administered 2012-11-24: 4 mg via INTRAVENOUS

## 2012-11-24 MED ORDER — PROPOFOL 10 MG/ML IV EMUL
INTRAVENOUS | Status: AC
  Filled 2012-11-24: qty 20

## 2012-11-24 MED ORDER — LACTATED RINGERS IV SOLN
INTRAVENOUS | Status: DC
  Administered 2012-11-24 (×2): via INTRAVENOUS

## 2012-11-24 MED ORDER — 0.9 % SODIUM CHLORIDE (POUR BTL) OPTIME: TOPICAL | Status: DC | PRN

## 2012-11-24 MED ORDER — MIDAZOLAM HCL 2 MG/2ML IJ SOLN
INTRAMUSCULAR | Status: DC | PRN
  Administered 2012-11-24: 2 mg via INTRAVENOUS

## 2012-11-24 MED ORDER — FENTANYL CITRATE 0.05 MG/ML IJ SOLN: 25.0000 ug | INTRAMUSCULAR | Status: DC | PRN

## 2012-11-24 MED ORDER — EPHEDRINE 5 MG/ML INJ
INTRAVENOUS | Status: AC
  Filled 2012-11-24: qty 10

## 2012-11-24 MED ORDER — PROMETHAZINE HCL 25 MG/ML IJ SOLN: 6.2500 mg | INTRAMUSCULAR | Status: DC | PRN

## 2012-11-24 MED ORDER — HYDROMORPHONE HCL PF 1 MG/ML IJ SOLN
INTRAMUSCULAR | Status: DC | PRN
  Administered 2012-11-24: 0.5 mg via INTRAVENOUS

## 2012-11-24 MED ORDER — FENTANYL CITRATE 0.05 MG/ML IJ SOLN
INTRAMUSCULAR | Status: AC
  Filled 2012-11-24: qty 5

## 2012-11-24 MED ORDER — KETOROLAC TROMETHAMINE 30 MG/ML IJ SOLN: 15.0000 mg | Freq: Once | INTRAMUSCULAR | Status: DC | PRN

## 2012-11-24 MED ORDER — LIDOCAINE HCL (CARDIAC) 20 MG/ML IV SOLN
INTRAVENOUS | Status: DC | PRN
  Administered 2012-11-24: 70 mg via INTRAVENOUS

## 2012-11-24 MED ORDER — LIDOCAINE HCL 1 % IJ SOLN
INTRAMUSCULAR | Status: DC | PRN
  Administered 2012-11-24: 20 mL

## 2012-11-24 MED ORDER — MIDAZOLAM HCL 2 MG/2ML IJ SOLN: 0.5000 mg | Freq: Once | INTRAMUSCULAR | Status: DC | PRN

## 2012-11-24 MED ORDER — IBUPROFEN 800 MG PO TABS
800.0000 mg | ORAL_TABLET | Freq: Three times a day (TID) | ORAL | Status: DC | PRN
Start: 2012-11-24 — End: 2013-10-08

## 2012-11-24 SURGICAL SUPPLY — 15 items
CANISTER SUCTION 2500CC (MISCELLANEOUS) ×2 IMPLANT
CATH ROBINSON RED A/P 16FR (CATHETERS) ×2 IMPLANT
CLOTH BEACON ORANGE TIMEOUT ST (SAFETY) ×2 IMPLANT
CONTAINER PREFILL 10% NBF 60ML (FORM) ×4 IMPLANT
DRESSING TELFA 8X3 (GAUZE/BANDAGES/DRESSINGS) ×2 IMPLANT
ELECT REM PT RETURN 9FT ADLT (ELECTROSURGICAL)
ELECTRODE REM PT RTRN 9FT ADLT (ELECTROSURGICAL) IMPLANT
GLOVE BIO SURGEON STRL SZ 6.5 (GLOVE) ×2 IMPLANT
GOWN STRL REIN XL XLG (GOWN DISPOSABLE) ×4 IMPLANT
LOOP ANGLED CUTTING 22FR (CUTTING LOOP) ×2 IMPLANT
NEEDLE SPNL 20GX3.5 QUINCKE YW (NEEDLE) IMPLANT
PACK HYSTEROSCOPY LF (CUSTOM PROCEDURE TRAY) ×2 IMPLANT
PAD OB MATERNITY 4.3X12.25 (PERSONAL CARE ITEMS) ×2 IMPLANT
TOWEL OR 17X24 6PK STRL BLUE (TOWEL DISPOSABLE) ×4 IMPLANT
WATER STERILE IRR 1000ML POUR (IV SOLUTION) ×2 IMPLANT

## 2012-11-24 NOTE — Anesthesia Preprocedure Evaluation (Addendum)
Anesthesia Evaluation  Patient identified by MRN, date of birth, ID band Patient awake    Reviewed: Allergy & Precautions, H&P , Patient's Chart, lab work & pertinent test results, reviewed documented beta blocker date and time   Airway Mallampati: III TM Distance: >3 FB Neck ROM: full    Dental no notable dental hx.    Pulmonary sleep apnea ,  breath sounds clear to auscultation  Pulmonary exam normal       Cardiovascular hypertension, On Medications Rhythm:regular Rate:Normal     Neuro/Psych    GI/Hepatic GERD-  ,  Endo/Other  Morbid obesity  Renal/GU      Musculoskeletal   Abdominal   Peds  Hematology   Anesthesia Other Findings  on medication GERD (gastroesophageal reflux disease) 2005 treated with omeprazole    Seasonal allergies   affected in the spring Hypertension 2002      High cholesterol 2002   History of sinusitis   once/twice per year    OSA (obstructive sleep apnea)   +sleep study 08/2010., DOES NOT USE CPAP  Rheumatoid arthritis HTN  Well controlled. EKG Normal MO    GERD controlled; took pm meds  Reproductive/Obstetrics                          Anesthesia Physical Anesthesia Plan  ASA: III  Anesthesia Plan:    Post-op Pain Management:    Induction: Intravenous  Airway Management Planned: LMA  Additional Equipment:   Intra-op Plan:   Post-operative Plan:   Informed Consent: I have reviewed the patients History and Physical, chart, labs and discussed the procedure including the risks, benefits and alternatives for the proposed anesthesia with the patient or authorized representative who has indicated his/her understanding and acceptance.   Dental Advisory Given and Dental advisory given  Plan Discussed with: CRNA and Surgeon  Anesthesia Plan Comments:         Anesthesia Quick Evaluation

## 2012-11-24 NOTE — Op Note (Signed)
NAME:  Alejandra Miller, Alejandra Miller NO.:  [id number]  MEDICAL RECORD NO.:  [id number]  LOCATION:  [facility]                          FACILITY:  [facility]  PHYSICIAN:  Randye Lobo, M.D.   DATE OF BIRTH:  20-Mar-1960  DATE OF PROCEDURE: DATE OF DISCHARGE:                              OPERATIVE REPORT   PREOPERATIVE DIAGNOSES:  Abnormal uterine bleeding, endometrial polyps, status post left Essure procedure.  POSTOPERATIVE DIAGNOSES:  Abnormal uterine bleeding, endometrial polyps, status post left Essure.  PROCEDURE:  Hysteroscopic polypectomy with dilation and curettage.  SURGEON:  Randye Lobo, MD  ANESTHESIA:  LMA, paracervical block with 1% lidocaine.  EBL:  25 mL.  URINE OUTPUT:  Less than 50 mL.  GLYCINE DEFICIT:  240 mL.  COMPLICATIONS:  None.  INDICATIONS FOR PROCEDURE:  The patient is a 52 year old, gravida 0, para 0, Caucasian female who presented with an episode of prolonged heavy bleeding this summer.  The patient has a known history of endometrial polyps noted on a prior sonohysterogram and these were not removed.  The patient has a history of a left-sided Essure.  The right side apparently was not possible.  The patient underwent an office ultrasound and sonohysterogram showing thickened endometrium, 2 small fibroids, and a small right paratubal cyst.  There were also multiple intracavitary echogenic foci which were suspicious for endometrial polyps.  Endometrial biopsy did confirm a benign endometrial polyp.  The plan is now made to proceed with hysteroscopic resection of endometrial polyps along with dilation and curettage.  Risks, benefits, and alternatives have been reviewed with the patient who wishes to proceed.  FINDINGS:  Exam under anesthesia revealed a small anteverted mobile uterus.  No adnexal masses were appreciated.  The uterus sounded to 9 cm.  The diagnostic hysteroscope indicated the presence of 3 polyps.  There was 1 large central polyp  which was connected to the uterine fundus at the top of the fundus and along the posterior uterine wall.  There was a small polyp along the right fundal wall and there was a medium-sized polyp which was along the left uterine wall superiorly closer to the cornual region.  The Essure was noted to be in the left fallopian tube.  There was no Essure noted on the right fallopian tube.  SPECIMENS:  The endometrial polyps were sent to pathology separate from the endometrial curettings.  A large quantity of endometrial polyp was sent to Pathology.  The endometrial curettings were very scant in nature and were sent separately.  PROCEDURE IN DETAIL:  The patient was reidentified in the preoperative hold area.  She received PAS stockings and TED hose for DVT prophylaxis.  In the operating room, the patient was placed in the supine position and an LMA anesthetic was induced.  She was then placed in the dorsal lithotomy position.  The lower abdomen, vagina, and perineum were sterilely prepped and draped.  Bladder was catheterized of urine.  The patient was sterilely draped.  An exam under anesthesia was performed.  A speculum was placed inside the vagina.  A single-tooth tenaculum was placed on the anterior cervical lip.  The uterus was sounded.  The cervix  was then dilated to a #21 Pratt dilator.  The diagnostic hysteroscope was inserted under the infusion of glycine.  The findings are as noted above.  The diagnostic hysteroscope was withdrawn and the cervix was further dilated to a #31 Pratt dilator.  The resectoscope was then introduced into the uterine cavity under the continuous infusion of glycine.  A combination of cautery and cutting were used to begin resection of the large central polyp.  This was removed in several pieces.  At one point, the resectoscope was removed and a polyp forceps was used to remove large pieces from within the endometrial cavity.  The resectoscope  was inserted again.  The smaller polyps along the uterine sidewalls were then again resected with the loop.  The final specimens were removed and all of the polyp pieces were sent to pathology together.  A sharp curette was then introduced into the uterine cavity and the 4 walls of the endometrial cavity were then curetted gently with a sharp curette.  This specimen was sent to pathology as well. The vaginal instruments were removed.  Hemostasis was good.  The patient was awakened, extubated, and escorted to the recovery room in stable condition.  There were no complications to the procedure.  All needle, instrument, and sponge counts were correct.     Randye Lobo, M.D.     BES/MEDQ  D:  11/24/2012  T:  11/24/2012  Job:  [id number]

## 2012-11-24 NOTE — Progress Notes (Signed)
Update to History and Physical  Patient examined.  OK to proceed with surgery.

## 2012-11-24 NOTE — Telephone Encounter (Signed)
Phone call to check on patient follow her outpatient surgery this morning, hysteroscopic polypectomies and dilation and curettage performed at the [facility].  No answer.  Left message on machine that she can call if she needs anything.  Patient has an appointment scheduled for 2 weeks.

## 2012-11-24 NOTE — H&P (Signed)
Melony Overly, MD at 11/17/2012  5:41 PM    Status: Signed                   Patient ID: Alejandra Miller, female   DOB: 1960/11/26, 52 y.o.   MRN: [id number] GYNECOLOGY PROBLEM VISIT  PCP:  Darrow Bussing, MD  Referring provider:   HPI: 52 y.o.   Married  Caucasian  female    G0P0 with No LMP recorded. Patient is not currently having periods (Reason: Perimenopausal).    here for  Pre-operative visit.  Has a history of prolonged and heavy uterine bleeding in July 2014.   Has a history of endometrial polyps noted on a prior sonohysterogram which were not removed.   History of left sided Essure only.   Took course of Provera which has helped to reduce heavy bleeding.   Ultrasound and sonohysterogram showed thickened endometrium, two small fibroids, and a small right paratubal cyst noted.   Left sided Essure noted as well.  Endometrial biopsy actually did show endometrial polyp and inactive endometrium with focal areas of hyalinizing fibrosis, suggestive of implantation site. No atypia or malignancy noted.  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  Has mupiricin at home and uses this on her skin if she has a sore developS.   GYNECOLOGIC HISTORY: No LMP recorded. Patient is not currently having periods (Reason: Perimenopausal). Sexually active:  yes Partner preference: female Contraception:   vasectomy Menopausal hormone therapy:   DES exposure: no    Blood transfusions: no     Sexually transmitted diseases:  no   GYN Procedures:    Mammogram:  07-20-12 [facility]                Pap:  06/2012 wnl   History of abnormal pap smear:  no    OB History     Grav  Para  Term  Preterm  Abortions  TAB  SAB  Ect  Mult  Living     0                               Family History   Problem  Relation  Age of Onset   .  Osteoarthritis  Mother     .  Liver cancer  Maternal Grandmother     .  Hyperlipidemia  Mother     .  Hyperlipidemia  Father     .  Coronary artery disease  Father     .   Hypertension  Father     .  Stroke  Father         multiple   .  Depression  Mother     .  Diabetes  Father         Patient Active Problem List     Diagnosis  Date Noted   .  Cervical cancer screening  07/20/2012   .  Sinusitis  02/12/2012   .  Obesity  02/12/2012   .  Leg swelling  12/08/2011   .  Annual physical exam  07/28/2011   .  Menstrual irregularity  12/15/2010   .  Vitamin D deficiency  12/15/2010   .  OSA (obstructive sleep apnea)  08/11/2010   .  Arthralgia  07/28/2010   .  Hyperlipidemia  07/28/2010   .  Hypertension  07/28/2010   .  Depression  07/28/2010       Past Medical  History   Diagnosis  Date   .  Depression         counseling in 2010 for 2 months on medication   .  GERD (gastroesophageal reflux disease)  2005       treated with omeprazole   .  Seasonal allergies         affected in the spring   .  Hypertension  2002   .  High cholesterol  2002   .  History of sinusitis         once/twice per year   .  Fibrocystic breast  1988   .  Cervical cancer screening  07/20/2012       Menarche at 13, regular til recently No h/o abnormal paps, last pap 2-3 years ago G0P0  MGM today, no h/o abnormal MGMs Spotting now monthly and scant H/o uterine polyps   .  Uterine polyp     .  Anxiety         NO MEDS   .  OSA (obstructive sleep apnea)         +sleep study 08/2010., DOES NOT USE CPAP, USES MOSES MOUTH PIECE   .  Rheumatoid arthritis(714.0)         KNEES, HANDS, HIPS       Past Surgical History   Procedure  Laterality  Date   .  Tonsillectomy    1969   .  Wrist fracture surgery  Right  07/2012   .  Tonsillectomy       .  Wisdom tooth extraction         ALLERGIES: Review of patient's allergies indicates no known allergies.    Current Outpatient Prescriptions   Medication  Sig  Dispense  Refill   .  atorvastatin (LIPITOR) 20 MG tablet  take 1 tablet by mouth once daily   30 tablet   3   .  bisoprolol-hydrochlorothiazide (ZIAC) 5-6.25 MG per tablet  take  2 tablets by mouth once daily   60 tablet   2   .  buPROPion (WELLBUTRIN SR) 150 MG 12 hr tablet  take 1 tablet by mouth twice a day   60 tablet   6   .  Cholecalciferol (VITAMIN D3) 1000 UNITS CAPS  Take 1 capsule by mouth daily.           .  citalopram (CELEXA) 20 MG tablet  Take 1 tablet (20 mg total) by mouth daily.   30 tablet   6   .  cyclobenzaprine (FLEXERIL) 10 MG tablet  take 1 tablet by mouth daily if needed   30 tablet   3   .  Krill Oil 500 MG CAPS  Take 500 mg by mouth daily.         .  Multiple Vitamin (MULTIVITAMIN) tablet  Take 1 tablet by mouth daily.           .  traMADol (ULTRAM) 50 MG tablet  Take 50 mg by mouth every 12 (twelve) hours as needed for pain.          Marland Kitchen  amLODipine (NORVASC) 5 MG tablet  take 1 tablet by mouth daily   30 tablet   3   .  omeprazole (PRILOSEC) 20 MG capsule  Take 1 capsule (20 mg total) by mouth daily.   30 capsule   5      No current facility-administered medications for this visit.      ROS:  Pertinent  items are noted in HPI.  SOCIAL HISTORY:  Married.   PHYSICAL EXAMINATION:    BP 132/80  Pulse 70  Ht 5\' 4"  (1.626 m)  Wt 250 lb 8 oz (113.626 kg)  BMI 42.98 kg/m2  Wt Readings from Last 3 Encounters:   11/17/12  250 lb 8 oz (113.626 kg)   11/17/12  245 lb (111.131 kg)   10/20/12  243 lb (110.224 kg)       Ht Readings from Last 3 Encounters:   11/17/12  5\' 4"  (1.626 m)   11/17/12  5\' 4"  (1.626 m)   10/20/12  5\' 4"  (1.626 m)     General appearance: alert, cooperative and appears stated age Head: Normocephalic, without obvious abnormality, atraumatic Neck: no adenopathy, supple, symmetrical, trachea midline and thyroid not enlarged, symmetric, no tenderness/mass/nodules Lungs: clear to auscultation bilaterally Breasts: Inspection negative, No nipple retraction or dimpling, No nipple discharge or bleeding, No axillary or supraclavicular adenopathy, Normal to palpation without dominant masses Heart: regular rate and  rhythm Abdomen: obese, soft, non-tender; no masses,  no organomegaly Extremities: extremities normal, atraumatic, no cyanosis or edema Skin: Skin color, texture, turgor normal. No rashes or lesions Lymph nodes: Cervical, supraclavicular, and axillary nodes normal. No abnormal inguinal nodes palpated Neurologic: Grossly normal  Pelvic: External genitalia:  no lesions              Urethra:  normal appearing urethra with no masses, tenderness or lesions              Bartholins and Skenes: normal                  Vagina: normal appearing vagina with normal color and discharge, no lesions              Cervix: normal appearance                      Bimanual Exam:  Uterus:  uterus is normal size, shape, consistency and nontender                                      Adnexa: normal adnexa in size, nontender and no masses                                     ASSESSMENT  Abnormal uterine bleeding. Endometrial polyps. Status post left Essure. Hx MRSA.    PLAN  Proceed with hysteroscopic polypectomy, dilation and curettage.  Risks, benefits, and alternatives discussed with patient who wishes to proceed. Risks included but were not limited to bleeding, infection, uterine perforation and subsequent laparoscopy, reaction to anesthesia, DVT, PE, and death.   Will check with the hospital regarding the protocol for the patient who has a history of MRSA infections.   An After Visit Summary was printed and given to the patient.

## 2012-11-24 NOTE — Transfer of Care (Signed)
Immediate Anesthesia Transfer of Care Note  Patient: Alejandra Miller  Procedure(s) Performed: Procedure(s): DILATATION & CURETTAGE/HYSTEROSCOPY WITH RESECTOCOPE (N/A)  Patient Location: PACU  Anesthesia Type:General  Level of Consciousness: awake, alert  and oriented  Airway & Oxygen Therapy: Patient Spontanous Breathing and Patient connected to nasal cannula oxygen  Post-op Assessment: Report given to PACU RN and Post -op Vital signs reviewed and stable  Post vital signs: stable  Complications: No apparent anesthesia complications

## 2012-11-24 NOTE — Brief Op Note (Signed)
11/24/2012  8:35 AM  PATIENT:  Alejandra Miller  52 y.o. female  PRE-OPERATIVE DIAGNOSIS:  Abnormal uterine bleeding, endometrial polyps, status post left Essure CPT [phone]  POST-OPERATIVE DIAGNOSIS:  uterine polyps, abnormal uterine bleeding, status post left Essure  PROCEDURE:  Procedure(s): DILATATION & CURETTAGE/HYSTEROSCOPY WITH RESECTOCOPE (N/A)  SURGEON:  Surgeon(s) and Role:    * Melony Overly, MD - Primary  PHYSICIAN ASSISTANT:   ASSISTANTS: none   ANESTHESIA:   pudendal block and  LMA  EBL:  Total I/O In: 1600 [I.V.:1600] Out: 75 [Urine:50; Blood:25]  BLOOD ADMINISTERED:none  DRAINS: none   LOCAL MEDICATIONS USED:  LIDOCAINE  and Amount:  10 ml  SPECIMEN:  Source of Specimen:   endometrial polyps, endometrial curettings  DISPOSITION OF SPECIMEN:  PATHOLOGY  COUNTS:  YES  TOURNIQUET:  * No tourniquets in log *  DICTATION: .Other Dictation: Dictation Number    PLAN OF CARE: Discharge to home after PACU  PATIENT DISPOSITION:  PACU - hemodynamically stable.   Delay start of Pharmacological VTE agent (>24hrs) due to surgical blood loss or risk of bleeding: not applicable

## 2012-11-24 NOTE — Anesthesia Postprocedure Evaluation (Signed)
Anesthesia Post Note  Patient: Alejandra Miller  Procedure(s) Performed: Procedure(s) (LRB): DILATATION & CURETTAGE/HYSTEROSCOPY WITH RESECTOCOPE (N/A)  Anesthesia type: GA  Patient location: PACU  Post pain: Pain level controlled  Post assessment: Post-op Vital signs reviewed  Last Vitals:  Filed Vitals:   11/24/12 0827  BP: 103/75  Pulse: 81  Temp: 36.5 C  Resp: 20    Post vital signs: Reviewed  Level of consciousness: sedated  Complications: No apparent anesthesia complications

## 2012-11-27 ENCOUNTER — Telehealth: Payer: Self-pay | Admitting: Obstetrics and Gynecology

## 2012-11-27 ENCOUNTER — Encounter (HOSPITAL_COMMUNITY): Payer: Self-pay | Admitting: Obstetrics and Gynecology

## 2012-11-27 NOTE — Telephone Encounter (Signed)
Phone call to discuss test results.   Did well over the end. Has some cramping and spotting.  Pathology showed benign endometroid polyps with no atypia or malignancy.  Has post op visit on October 10th.

## 2012-12-08 ENCOUNTER — Encounter: Payer: Self-pay | Admitting: Obstetrics and Gynecology

## 2012-12-08 ENCOUNTER — Ambulatory Visit (INDEPENDENT_AMBULATORY_CARE_PROVIDER_SITE_OTHER): Payer: Managed Care, Other (non HMO) | Admitting: Obstetrics and Gynecology

## 2012-12-08 VITALS — BP 120/78 | HR 68 | Temp 98.2°F | Ht 64.0 in | Wt 251.0 lb

## 2012-12-08 DIAGNOSIS — Z9889 Other specified postprocedural states: Secondary | ICD-10-CM

## 2012-12-08 NOTE — Progress Notes (Signed)
Patient ID: Alejandra Miller, female   DOB: November 10, 1960, 52 y.o.   MRN: [id number]  Subjective  Status post hysteroscopic resection of endometrial polyps and dilation and curettage on 11/24/12 at [facility]. Some cramping after the procedure.  Bled for a couple of days and then stopped. Felt PMS symptoms. No problems or concerns.  Objective  Pelvic  Normal external genitalia and urethra.  Cervix no lesions.  Small amount of light bloody discharge.  Uterus small and nontender. No adnexal masses or tenderness.  Pathology - benign endometrioid type polyps, atrophic endometrium.  No hyperplasia or malignancy.   Assessment  Doing well post op.  Plan  OK to return to all normal activities. Observation of cycles.  Follow up for annual exam in June 2015.

## 2012-12-09 ENCOUNTER — Encounter: Payer: Self-pay | Admitting: Obstetrics and Gynecology

## 2013-01-04 ENCOUNTER — Other Ambulatory Visit: Payer: Self-pay

## 2013-01-07 ENCOUNTER — Other Ambulatory Visit: Payer: Self-pay | Admitting: Internal Medicine

## 2013-01-07 ENCOUNTER — Other Ambulatory Visit: Payer: Self-pay | Admitting: Family Medicine

## 2013-01-08 NOTE — Telephone Encounter (Signed)
Rx request to pharmacy/[facility]  

## 2013-01-12 ENCOUNTER — Ambulatory Visit: Payer: Managed Care, Other (non HMO) | Admitting: Family Medicine

## 2013-01-16 ENCOUNTER — Ambulatory Visit: Payer: Managed Care, Other (non HMO) | Admitting: Family Medicine

## 2013-01-22 ENCOUNTER — Ambulatory Visit (INDEPENDENT_AMBULATORY_CARE_PROVIDER_SITE_OTHER): Payer: Managed Care, Other (non HMO) | Admitting: Family Medicine

## 2013-01-22 ENCOUNTER — Telehealth: Payer: Self-pay | Admitting: Family Medicine

## 2013-01-22 ENCOUNTER — Encounter: Payer: Self-pay | Admitting: Family Medicine

## 2013-01-22 VITALS — BP 124/84 | HR 61 | Temp 98.1°F | Ht 64.0 in | Wt 257.1 lb

## 2013-01-22 DIAGNOSIS — F32A Depression, unspecified: Secondary | ICD-10-CM

## 2013-01-22 DIAGNOSIS — F329 Major depressive disorder, single episode, unspecified: Secondary | ICD-10-CM

## 2013-01-22 DIAGNOSIS — Z1211 Encounter for screening for malignant neoplasm of colon: Secondary | ICD-10-CM

## 2013-01-22 DIAGNOSIS — I10 Essential (primary) hypertension: Secondary | ICD-10-CM

## 2013-01-22 DIAGNOSIS — E669 Obesity, unspecified: Secondary | ICD-10-CM

## 2013-01-22 DIAGNOSIS — E785 Hyperlipidemia, unspecified: Secondary | ICD-10-CM

## 2013-01-22 DIAGNOSIS — Z23 Encounter for immunization: Secondary | ICD-10-CM

## 2013-01-22 LAB — LIPID PANEL
Cholesterol: 157 mg/dL (ref 0–200)
HDL: 41 mg/dL (ref >39)
LDL Cholesterol: 95 mg/dL (ref 0–99)
Triglycerides: 105 mg/dL (ref <150)
VLDL: 21 mg/dL (ref 0–40)

## 2013-01-22 LAB — RENAL FUNCTION PANEL
Albumin: 4.2 g/dL (ref 3.5–5.2)
Calcium: 10 mg/dL (ref 8.4–10.5)
Chloride: 103 mEq/L (ref 96–112)
Glucose, Bld: 85 mg/dL (ref 70–99)
Phosphorus: 2.9 mg/dL (ref 2.3–4.6)
Potassium: 4.5 mEq/L (ref 3.5–5.3)
Sodium: 141 mEq/L (ref 135–145)

## 2013-01-22 LAB — HEPATIC FUNCTION PANEL
Alkaline Phosphatase: 75 U/L (ref 39–117)
Bilirubin, Direct: 0.1 mg/dL (ref 0.0–0.3)
Indirect Bilirubin: 0.4 mg/dL (ref 0.0–0.9)

## 2013-01-22 NOTE — Assessment & Plan Note (Signed)
Struggling with her husbands severe depression but she feels he is doing somewhat  Better. She feels hers is adequately managed. No Anhedonia or persistent depressive symptoms, no changes today

## 2013-01-22 NOTE — Telephone Encounter (Signed)
Lab order placed.

## 2013-01-22 NOTE — Assessment & Plan Note (Signed)
Discussed need for DASH diet and regular exercise, avoid processed foods. Patient is considering Belviq but it is cost prohibitive at this time

## 2013-01-22 NOTE — Telephone Encounter (Signed)
Lab order week of 07-13-2013 Annual next visit. Labs at or prior lipid, renal, cbc, tsh and hepatic

## 2013-01-22 NOTE — Progress Notes (Signed)
Pre visit review using our clinic review tool, if applicable. No additional management support is needed unless otherwise documented below in the visit note. 

## 2013-01-22 NOTE — Assessment & Plan Note (Signed)
Well controlled on current meds no changes 

## 2013-01-22 NOTE — Patient Instructions (Signed)
DASH Diet  The DASH diet stands for "Dietary Approaches to Stop Hypertension." It is a healthy eating plan that has been shown to reduce high blood pressure (hypertension) in as little as 14 days, while also possibly providing other significant health benefits. These other health benefits include reducing the risk of breast cancer after menopause and reducing the risk of type 2 diabetes, heart disease, colon cancer, and stroke. Health benefits also include weight loss and slowing kidney failure in patients with chronic kidney disease.   DIET GUIDELINES  · Limit salt (sodium). Your diet should contain less than 1500 mg of sodium daily.  · Limit refined or processed carbohydrates. Your diet should include mostly whole grains. Desserts and added sugars should be used sparingly.  · Include small amounts of heart-healthy fats. These types of fats include nuts, oils, and tub margarine. Limit saturated and trans fats. These fats have been shown to be harmful in the body.  CHOOSING FOODS   The following food groups are based on a 2000 calorie diet. See your Registered Dietitian for individual calorie needs.  Grains and Grain Products (6 to 8 servings daily)  · Eat More Often: Whole-wheat bread, brown rice, whole-grain or wheat pasta, quinoa, popcorn without added fat or salt (air popped).  · Eat Less Often: White bread, white pasta, white rice, cornbread.  Vegetables (4 to 5 servings daily)  · Eat More Often: Fresh, frozen, and canned vegetables. Vegetables may be raw, steamed, roasted, or grilled with a minimal amount of fat.  · Eat Less Often/Avoid: Creamed or fried vegetables. Vegetables in a cheese sauce.  Fruit (4 to 5 servings daily)  · Eat More Often: All fresh, canned (in natural juice), or frozen fruits. Dried fruits without added sugar. One hundred percent fruit juice (½ cup [237 mL] daily).  · Eat Less Often: Dried fruits with added sugar. Canned fruit in light or heavy syrup.  Lean Meats, Fish, and Poultry (2  servings or less daily. One serving is 3 to 4 oz [85-114 g]).  · Eat More Often: Ninety percent or leaner ground beef, tenderloin, sirloin. Round cuts of beef, chicken breast, turkey breast. All fish. Grill, bake, or broil your meat. Nothing should be fried.  · Eat Less Often/Avoid: Fatty cuts of meat, turkey, or chicken leg, thigh, or wing. Fried cuts of meat or fish.  Dairy (2 to 3 servings)  · Eat More Often: Low-fat or fat-free milk, low-fat plain or light yogurt, reduced-fat or part-skim cheese.  · Eat Less Often/Avoid: Milk (whole, 2%). Whole milk yogurt. Full-fat cheeses.  Nuts, Seeds, and Legumes (4 to 5 servings per week)  · Eat More Often: All without added salt.  · Eat Less Often/Avoid: Salted nuts and seeds, canned beans with added salt.  Fats and Sweets (limited)  · Eat More Often: Vegetable oils, tub margarines without trans fats, sugar-free gelatin. Mayonnaise and salad dressings.  · Eat Less Often/Avoid: Coconut oils, palm oils, butter, stick margarine, cream, half and half, cookies, candy, pie.  FOR MORE INFORMATION  The Dash Diet Eating Plan: www.dashdiet.org  Document Released: 02/04/2011 Document Revised: 05/10/2011 Document Reviewed: 02/04/2011  Exit[employer]® Patient Information ©2014 [employer].

## 2013-01-22 NOTE — Assessment & Plan Note (Signed)
Repeat lipid panel today avoid trans fats.

## 2013-01-22 NOTE — Progress Notes (Signed)
Patient ID: Alejandra Miller, female   DOB: 05-10-1960, 52 y.o.   MRN: [id number] Alejandra Miller [id number] [birth year] 01/22/2013      Progress Note-Follow Up  Subjective  Chief Complaint  Chief Complaint  Patient presents with  . Follow-up    6 month  . Injections    Prevnar and tdap    HPI  Patient is a 52 year old Caucasian female who is in today for followup appointment. She struggling with her husband severe depression secondary to the loss of his job but she feels otherwise she's doing well. She feels her depression is well controlled. She denies anhedonia or suicidal ideation. She generally has felt well. She did have a bad fall back in June fractured her right wrist and had to have a metal plate placed. After undergoing rehabilitation she said much better. Then over the summer she had significant increase in vaginal bleeding and ultimately underwent a D&C and hysteroscopy. With Dr. Edward Jolly. She's done well since then with only occasional spotting. She denies any recent illness, fevers, chest pain, palpitations, shortness of breath, GI or GU concerns.  Past Medical History  Diagnosis Date  . Depression     counseling in 2010 for 2 months on medication  . GERD (gastroesophageal reflux disease) 2005    treated with omeprazole  . Seasonal allergies     affected in the spring  . Hypertension 2002  . High cholesterol 2002  . History of sinusitis     once/twice per year  . Fibrocystic breast 1988  . Cervical cancer screening 07/20/2012    Menarche at 13, regular til recently No h/o abnormal paps, last pap 2-3 years ago G0P0  MGM today, no h/o abnormal MGMs Spotting now monthly and scant H/o uterine polyps  . Uterine polyp   . Anxiety     NO MEDS  . OSA (obstructive sleep apnea)     +sleep study 08/2010., DOES NOT USE CPAP, USES MOSES MOUTH PIECE  . Rheumatoid arthritis(714.0)     KNEES, HANDS, HIPS    Past Surgical History  Procedure Laterality Date  . Tonsillectomy  1969  . Wrist  fracture surgery Right 07/2012  . Tonsillectomy    . Wisdom tooth extraction    . Dilatation & currettage/hysteroscopy with resectocope N/A 11/24/2012    Procedure: DILATATION & CURETTAGE/HYSTEROSCOPY WITH RESECTOCOPE;  Surgeon: Melony Overly, MD;  Location: [facility];  Service: Gynecology;  Laterality: N/A;    Family History  Problem Relation Age of Onset  . Osteoarthritis Mother   . Liver cancer Maternal Grandmother   . Hyperlipidemia Mother   . Hyperlipidemia Father   . Coronary artery disease Father   . Hypertension Father   . Stroke Father     multiple  . Depression Mother   . Diabetes Father     History   Social History  . Marital Status: Married    Spouse Name: N/A    Number of Children: N/A  . Years of Education: N/A   Occupational History  . Not on file.   Social History Main Topics  . Smoking status: Former Smoker -- 0.50 packs/day for 6 years    Types: Cigarettes    Quit date: 03/01/1989  . Smokeless tobacco: Never Used     Comment: Quit in 1991- started in 1983 1/2ppd  . Alcohol Use: No  . Drug Use: No  . Sexual Activity: Yes    Birth Control/ Protection: None, Other-see comments     Comment: vasectomy  Other Topics Concern  . Not on file   Social History Narrative  . No narrative on file    Current Outpatient Prescriptions on File Prior to Visit  Medication Sig Dispense Refill  . amLODipine (NORVASC) 5 MG tablet take 1 tablet by mouth daily  30 tablet  3  . atorvastatin (LIPITOR) 20 MG tablet take 1 tablet by mouth once daily  30 tablet  3  . bisoprolol-hydrochlorothiazide (ZIAC) 5-6.25 MG per tablet take 2 tablets by mouth once daily  60 tablet  2  . buPROPion (WELLBUTRIN SR) 150 MG 12 hr tablet take 1 tablet by mouth twice a day  60 tablet  6  . Cholecalciferol (VITAMIN D3) 1000 UNITS CAPS Take 1 capsule by mouth daily.        . citalopram (CELEXA) 20 MG tablet take 1 tablet by mouth once daily  30 tablet  3  . cyclobenzaprine (FLEXERIL) 10 MG  tablet take 1 tablet by mouth daily if needed  30 tablet  3  . ibuprofen (ADVIL,MOTRIN) 800 MG tablet Take 1 tablet (800 mg total) by mouth every 8 (eight) hours as needed for pain.  30 tablet  0  . Krill Oil 500 MG CAPS Take 500 mg by mouth daily.      . Multiple Vitamin (MULTIVITAMIN) tablet Take 1 tablet by mouth daily.        Marland Kitchen omeprazole (PRILOSEC) 20 MG capsule Take 1 capsule (20 mg total) by mouth daily.  30 capsule  5  . traMADol (ULTRAM) 50 MG tablet Take 50 mg by mouth every 12 (twelve) hours as needed for pain.        No current facility-administered medications on file prior to visit.    No Known Allergies  Review of Systems  Review of Systems  Constitutional: Positive for malaise/fatigue. Negative for fever.  HENT: Negative for congestion.   Eyes: Negative for discharge.  Respiratory: Negative for shortness of breath.   Cardiovascular: Negative for chest pain, palpitations and leg swelling.  Gastrointestinal: Negative for nausea, abdominal pain and diarrhea.  Genitourinary: Negative for dysuria.  Musculoskeletal: Negative for falls.  Skin: Negative for rash.  Neurological: Negative for loss of consciousness and headaches.  Endo/Heme/Allergies: Negative for polydipsia.  Psychiatric/Behavioral: Negative for depression and suicidal ideas. The patient is not nervous/anxious and does not have insomnia.     Objective  BP 124/84  Pulse 61  Temp(Src) 98.1 F (36.7 C) (Oral)  Ht 5\' 4"  (1.626 m)  Wt 257 lb 1.9 oz (116.629 kg)  BMI 44.11 kg/m2  SpO2 96%  LMP 09/02/2012  Physical Exam  Physical Exam  Constitutional: She is oriented to person, place, and time and well-developed, well-nourished, and in no distress. No distress.  HENT:  Head: Normocephalic and atraumatic.  Eyes: Conjunctivae are normal.  Neck: Neck supple. No thyromegaly present.  Cardiovascular: Normal rate, regular rhythm and normal heart sounds.   No murmur heard. Pulmonary/Chest: Effort normal and  breath sounds normal. She has no wheezes.  Abdominal: She exhibits no distension and no mass.  Musculoskeletal: She exhibits no edema.  Lymphadenopathy:    She has no cervical adenopathy.  Neurological: She is alert and oriented to person, place, and time.  Skin: Skin is warm and dry. No rash noted. She is not diaphoretic.  Psychiatric: Memory, affect and judgment normal.    Lab Results  Component Value Date   TSH 0.922 07/20/2012   Lab Results  Component Value Date   WBC 6.7 11/17/2012  HGB 11.4* 11/17/2012   HCT 36.5 11/17/2012   MCV 81.3 11/17/2012   PLT 382 11/17/2012   Lab Results  Component Value Date   CREATININE 0.89 11/17/2012   BUN 15 11/17/2012   NA 140 11/17/2012   K 4.4 11/17/2012   CL 101 11/17/2012   CO2 29 11/17/2012   Lab Results  Component Value Date   ALT 24 07/20/2012   AST 24 07/20/2012   ALKPHOS 75 07/20/2012   BILITOT 0.5 07/20/2012   Lab Results  Component Value Date   CHOL 155 07/20/2012   Lab Results  Component Value Date   HDL 33* 07/20/2012   Lab Results  Component Value Date   LDLCALC 96 07/20/2012   Lab Results  Component Value Date   TRIG 132 07/20/2012   Lab Results  Component Value Date   CHOLHDL 4.7 07/20/2012     Assessment & Plan  Hypertension Well controlled on current meds no changes  Depression Struggling with her husbands severe depression but she feels he is doing somewhat  Better. She feels hers is adequately managed. No Anhedonia or persistent depressive symptoms, no changes today  Obesity Discussed need for DASH diet and regular exercise, avoid processed foods. Patient is considering Belviq but it is cost prohibitive at this time  Hyperlipidemia Repeat lipid panel today avoid trans fats.

## 2013-01-30 ENCOUNTER — Encounter: Payer: Self-pay | Admitting: Internal Medicine

## 2013-02-13 ENCOUNTER — Other Ambulatory Visit: Payer: Self-pay | Admitting: Family Medicine

## 2013-03-24 ENCOUNTER — Other Ambulatory Visit: Payer: Self-pay | Admitting: Family Medicine

## 2013-03-26 ENCOUNTER — Encounter: Payer: Managed Care, Other (non HMO) | Admitting: Internal Medicine

## 2013-04-20 ENCOUNTER — Encounter: Payer: Managed Care, Other (non HMO) | Admitting: Internal Medicine

## 2013-04-22 ENCOUNTER — Other Ambulatory Visit: Payer: Self-pay | Admitting: Family Medicine

## 2013-05-27 ENCOUNTER — Other Ambulatory Visit: Payer: Self-pay | Admitting: Family Medicine

## 2013-06-26 ENCOUNTER — Ambulatory Visit: Payer: Managed Care, Other (non HMO) | Admitting: Family Medicine

## 2013-07-03 ENCOUNTER — Other Ambulatory Visit: Payer: Self-pay | Admitting: Family Medicine

## 2013-07-20 ENCOUNTER — Encounter: Payer: Managed Care, Other (non HMO) | Admitting: Family Medicine

## 2013-08-01 ENCOUNTER — Ambulatory Visit: Payer: Managed Care, Other (non HMO) | Admitting: Obstetrics and Gynecology

## 2013-08-09 ENCOUNTER — Other Ambulatory Visit: Payer: Self-pay | Admitting: Family Medicine

## 2013-08-16 ENCOUNTER — Telehealth: Payer: Self-pay

## 2013-08-16 NOTE — Telephone Encounter (Signed)
Pt left a message stating that she has had trigger finger in the past and seen ortho for it. Pt stated that she is now having issues in 2 different fingers? Pt would like to know if Dr Charlett Blake can treat her for this or should she go back to ortho?  Please advise?

## 2013-08-16 NOTE — Telephone Encounter (Signed)
Pt has an ortho already and is going to contact them

## 2013-08-16 NOTE — Telephone Encounter (Signed)
If her finger is acting up I refer to ortho or sports med. I have a sports med doctor in our group now. I can place referral if she would like me to

## 2013-10-08 ENCOUNTER — Ambulatory Visit (AMBULATORY_SURGERY_CENTER): Payer: 59 | Admitting: *Deleted

## 2013-10-08 VITALS — Ht 63.5 in | Wt 283.6 lb

## 2013-10-08 DIAGNOSIS — Z1211 Encounter for screening for malignant neoplasm of colon: Secondary | ICD-10-CM

## 2013-10-08 MED ORDER — NA SULFATE-K SULFATE-MG SULF 17.5-3.13-1.6 GM/177ML PO SOLN: ORAL | Status: DC

## 2013-10-08 NOTE — Progress Notes (Signed)
No allergies to eggs or soy. No problems with anesthesia.  Pt given Emmi instructions for colonoscopy  No oxygen use  No diet drug use  

## 2013-10-12 ENCOUNTER — Ambulatory Visit (INDEPENDENT_AMBULATORY_CARE_PROVIDER_SITE_OTHER): Payer: 59 | Admitting: Obstetrics and Gynecology

## 2013-10-12 ENCOUNTER — Encounter: Payer: Self-pay | Admitting: Obstetrics and Gynecology

## 2013-10-12 VITALS — BP 120/76 | HR 64 | Resp 16 | Ht 64.0 in | Wt 276.8 lb

## 2013-10-12 DIAGNOSIS — T148XXA Other injury of unspecified body region, initial encounter: Secondary | ICD-10-CM

## 2013-10-12 DIAGNOSIS — L98499 Non-pressure chronic ulcer of skin of other sites with unspecified severity: Secondary | ICD-10-CM

## 2013-10-12 DIAGNOSIS — N926 Irregular menstruation, unspecified: Secondary | ICD-10-CM

## 2013-10-12 DIAGNOSIS — R238 Other skin changes: Secondary | ICD-10-CM

## 2013-10-12 DIAGNOSIS — Z01419 Encounter for gynecological examination (general) (routine) without abnormal findings: Secondary | ICD-10-CM

## 2013-10-12 DIAGNOSIS — L98492 Non-pressure chronic ulcer of skin of other sites with fat layer exposed: Secondary | ICD-10-CM

## 2013-10-12 DIAGNOSIS — Z0000 Encounter for general adult medical examination without abnormal findings: Secondary | ICD-10-CM

## 2013-10-12 LAB — POCT URINALYSIS DIPSTICK
BILIRUBIN UA: NEGATIVE
Blood, UA: NEGATIVE
Glucose, UA: NEGATIVE
Ketones, UA: NEGATIVE
LEUKOCYTES UA: NEGATIVE
NITRITE UA: NEGATIVE
PH UA: 7
Protein, UA: NEGATIVE
Urobilinogen, UA: NEGATIVE

## 2013-10-12 MED ORDER — SULFAMETHOXAZOLE-TRIMETHOPRIM 800-160 MG PO TABS: 1.0000 | ORAL_TABLET | Freq: Two times a day (BID) | ORAL | Status: DC

## 2013-10-12 NOTE — Patient Instructions (Signed)

## 2013-10-12 NOTE — Progress Notes (Signed)
Patient ID: Alejandra Miller, female   DOB: 02-02-1961, 53 y.o.   MRN: [id number] GYNECOLOGY VISIT  PCP:   Gwyneth Revels, MD  Referring provider:   HPI: 52 y.o.   Married  Caucasian  female   G0P0 with Patient's last menstrual period was 07/17/2012.   here for  AEX. About every 2 - 3 months having spotting/streaking.  Status post D & C with polypectomy in Sept.  2014 - benign polyps. Normal menses in Oct. Spotting in November and December. Then skipped a few months.  When has bleedin, it is for 3 - 4 days.  Patient has had a recent spider bite on right breast.  She states healing but area "looks bad" because she has scratched so much.  Scratched skin near this and now ulcerated.  History of MRSA.  Hgb:    PCP - Will see PCP 11/02/13. Urine:  Neg  GYNECOLOGIC HISTORY: Patient's last menstrual period was 07/17/2012. Sexually active:  yes Partner preference: female Contraception:  vasectomy  Menopausal hormone therapy: n/a DES exposure:  no  Blood transfusions:  no  Sexually transmitted diseases:   no GYN procedures and prior surgeries:  D & C/hysteroscopy Last mammogram: 07-20-12 scattered fibroglandular pattern, otherwise normal:[facility]              Last pap and high risk HPV testing:  05/2012 wnl, no HPV testing done.  History of abnormal pap smear:  no   OB History   Grav Para Term Preterm Abortions TAB SAB Ect Mult Living   0                LIFESTYLE: Exercise:    Trying to begin walking         OTHER HEALTH MAINTENANCE: Tetanus/TDap:  2014 HPV:                n/a Influenza:       11/2012   Bone density:   n/a Colonoscopy:  Scheduled 10-22-13 [facility].  Cholesterol check:  2014 normal  Family History  Problem Relation Age of Onset  . Osteoarthritis Mother   . Hyperlipidemia Mother   . Depression Mother   . Liver cancer Maternal Grandmother     mets to breast  . Breast cancer Maternal Grandmother     liver mets to breast  .  Hyperlipidemia Father   . Coronary artery disease Father   . Hypertension Father   . Stroke Father     multiple  . Diabetes Father   . Colon cancer Neg Hx     Patient Active Problem List   Diagnosis Date Noted  . Cervical cancer screening 07/20/2012  . Sinusitis 02/12/2012  . Obesity 02/12/2012  . Leg swelling 12/08/2011  . Annual physical exam 07/28/2011  . Menstrual irregularity 12/15/2010  . Vitamin D deficiency 12/15/2010  . OSA (obstructive sleep apnea) 08/11/2010  . Arthralgia 07/28/2010  . Hyperlipidemia 07/28/2010  . Hypertension 07/28/2010  . Depression 07/28/2010   Past Medical History  Diagnosis Date  . Depression     counseling in 2010 for 2 months on medication  . GERD (gastroesophageal reflux disease) 2005    treated with omeprazole  . Seasonal allergies     affected in the spring  . Hypertension 2002  . High cholesterol 2002  . History of sinusitis     once/twice per year  . Fibrocystic breast 1988  . Cervical cancer screening 07/20/2012    Menarche at 13, regular  til recently No h/o abnormal paps, last pap 2-3 years ago G0P0  MGM today, no h/o abnormal MGMs Spotting now monthly and scant H/o uterine polyps  . Uterine polyp   . Anxiety     NO MEDS  . OSA (obstructive sleep apnea)     +sleep study 08/2010., DOES NOT USE CPAP, USES MOSES MOUTH PIECE  . Osteoarthritis     KNEES, HANDS, HIPS  . Sleep apnea     mouthpiece "[facility]"    Past Surgical History  Procedure Laterality Date  . Tonsillectomy  1969  . Wrist fracture surgery Right 07/2012  . Wisdom tooth extraction  1979  . Dilatation & currettage/hysteroscopy with resectocope N/A 11/24/2012    Procedure: DILATATION & CURETTAGE/HYSTEROSCOPY WITH RESECTOCOPE;  Surgeon: Arloa Koh, MD;  Location: [facility];  Service: Gynecology;  Laterality: N/A;    ALLERGIES: Review of patient's allergies indicates no known allergies.  Current Outpatient Prescriptions  Medication Sig Dispense Refill  .  amLODipine (NORVASC) 5 MG tablet take 1 tablet by mouth once daily  30 tablet  3  . atorvastatin (LIPITOR) 20 MG tablet take 1 tablet by mouth once daily  30 tablet  3  . bisoprolol-hydrochlorothiazide (ZIAC) 5-6.25 MG per tablet TAKE 2 TABLETS DAILY  60 tablet  2  . buPROPion (WELLBUTRIN SR) 150 MG 12 hr tablet take 1 tablet by mouth twice a day  60 tablet  4  . Cholecalciferol (VITAMIN D3) 1000 UNITS CAPS Take 1 capsule by mouth daily.        . citalopram (CELEXA) 20 MG tablet take 1 tablet by mouth once daily  30 tablet  3  . cyclobenzaprine (FLEXERIL) 10 MG tablet take 1 tablet by mouth daily if needed  30 tablet  3  . Krill Oil 500 MG CAPS Take 500 mg by mouth daily.      . Multiple Vitamin (MULTIVITAMIN) tablet Take 1 tablet by mouth daily.        Marland Kitchen NAPROXEN PO Take by mouth as needed.      Marland Kitchen VITAMIN A PO Take 1,000 Units by mouth daily.      . Na Sulfate-K Sulfate-Mg Sulf (SUPREP BOWEL PREP) SOLN suprep as directed.  No substitutions  354 mL  0  . omeprazole (PRILOSEC) 20 MG capsule Take 1 capsule (20 mg total) by mouth daily.  30 capsule  5   No current facility-administered medications for this visit.     ROS:  Pertinent items are noted in HPI.  History   Social History  . Marital Status: Married    Spouse Name: N/A    Number of Children: N/A  . Years of Education: N/A   Occupational History  . Not on file.   Social History Main Topics  . Smoking status: Former Smoker -- 0.50 packs/day for 6 years    Types: Cigarettes    Quit date: 03/01/1989  . Smokeless tobacco: Never Used     Comment: Quit in 1991- started in 1983 1/2ppd  . Alcohol Use: No  . Drug Use: No  . Sexual Activity: Yes    Partners: Male    Birth Control/ Protection: None, Other-see comments     Comment: vasectomy   Other Topics Concern  . Not on file   Social History Narrative  . No narrative on file    PHYSICAL EXAMINATION:    BP 120/76  Pulse 64  Resp 16  Ht 5\' 4"  (1.626 m)  Wt 276 lb  12.8  oz (125.556 kg)  BMI 47.49 kg/m2  LMP 07/17/2012   Wt Readings from Last 3 Encounters:  10/12/13 276 lb 12.8 oz (125.556 kg)  10/08/13 283 lb 9.6 oz (128.64 kg)  01/22/13 257 lb 1.9 oz (116.629 kg)     Ht Readings from Last 3 Encounters:  10/12/13 5\' 4"  (1.626 m)  10/08/13 5' 3.5" (1.613 m)  01/22/13 5\' 4"  (1.626 m)    General appearance: alert, cooperative and appears stated age Head: Normocephalic, without obvious abnormality, atraumatic Neck: no adenopathy, supple, symmetrical, trachea midline and thyroid not enlarged, symmetric, no tenderness/mass/nodules Lungs: clear to auscultation bilaterally Breasts: Inspection negative on left, right breast with small excoriated area and then three ulcers - one measuring 2 x 1.5 cm, No nipple retraction or dimpling, No nipple discharge or bleeding, No axillary or supraclavicular adenopathy, Normal to palpation without dominant masses Heart: regular rate and rhythm Abdomen: soft, non-tender; no masses,  no organomegaly Extremities: extremities normal, atraumatic, no cyanosis or edema Skin: Skin color, texture, turgor normal. No rashes or lesions Lymph nodes: Cervical, supraclavicular, and axillary nodes normal. No abnormal inguinal nodes palpated Neurologic: Grossly normal  Pelvic: External genitalia:  no lesions              Urethra:  normal appearing urethra with no masses, tenderness or lesions              Bartholins and Skenes: normal                 Vagina: normal appearing vagina with normal color and discharge, no lesions, light menstrual flow.              Cervix: normal appearance              Pap and high risk HPV testing done: Yes.          Bimanual Exam:  Uterus:  uterus is normal size, shape, consistency and nontender                                      Adnexa: normal adnexa in size, nontender and no masses                                      Rectovaginal:  Yes.                                        Confirms above.                                       Anus:  normal sphincter tone, no lesions  ASSESSMENT  Normal gynecologic exam. Skin ulcers. History of MRSA. Irregular menses. History of endometrial polyps.  PLAN  Mammogram recommended yearly starting at age 64 - will do after healing of ulcers on breast skin. Pap smear and high risk HPV testing as above. Counseled on self breast exam, Calcium and vitamin D intake, exercise. See lab orders: Yes.  - FSH, Estradiol, skin culture. Bactrim DS po bid for 10 days.  Return for recheck in 14 days.  Return annually or prn   An After Visit Summary was printed and  given to the patient.

## 2013-10-13 LAB — FOLLICLE STIMULATING HORMONE: FSH: 53.7 m[IU]/mL

## 2013-10-13 LAB — ESTRADIOL: Estradiol: 33.5 pg/mL

## 2013-10-16 ENCOUNTER — Encounter: Payer: Self-pay | Admitting: Internal Medicine

## 2013-10-16 LAB — WOUND CULTURE
GRAM STAIN: NONE SEEN
Gram Stain: NONE SEEN
Gram Stain: NONE SEEN

## 2013-10-16 LAB — IPS PAP TEST WITH HPV

## 2013-10-22 ENCOUNTER — Ambulatory Visit (AMBULATORY_SURGERY_CENTER): Payer: 59 | Admitting: Internal Medicine

## 2013-10-22 ENCOUNTER — Other Ambulatory Visit: Payer: Self-pay | Admitting: Family Medicine

## 2013-10-22 ENCOUNTER — Encounter: Payer: Self-pay | Admitting: Internal Medicine

## 2013-10-22 VITALS — BP 121/72 | HR 59 | Temp 99.2°F | Resp 19 | Ht 63.0 in | Wt 283.0 lb

## 2013-10-22 DIAGNOSIS — Z1211 Encounter for screening for malignant neoplasm of colon: Secondary | ICD-10-CM

## 2013-10-22 MED ORDER — SODIUM CHLORIDE 0.9 % IV SOLN: 500.0000 mL | INTRAVENOUS | Status: DC

## 2013-10-22 NOTE — Patient Instructions (Addendum)
No polyps! Colonoscopy was normal.  Next routine colonoscopy in 10 years - 2025  I appreciate the opportunity to care for you. Gatha Mayer, MD, [facility]  Discharge instructions given with verbal understanding. Normal exam. Resume previous medications. YOU HAD AN ENDOSCOPIC PROCEDURE TODAY AT [facility]: Refer to the procedure report that was given to you for any specific questions about what was found during the examination.  If the procedure report does not answer your questions, please call your gastroenterologist to clarify.  If you requested that your care partner not be given the details of your procedure findings, then the procedure report has been included in a sealed envelope for you to review at your convenience later.  YOU SHOULD EXPECT: Some feelings of bloating in the abdomen. Passage of more gas than usual.  Walking can help get rid of the air that was put into your GI tract during the procedure and reduce the bloating. If you had a lower endoscopy (such as a colonoscopy or flexible sigmoidoscopy) you may notice spotting of blood in your stool or on the toilet paper. If you underwent a bowel prep for your procedure, then you may not have a normal bowel movement for a few days.  DIET: Your first meal following the procedure should be a light meal and then it is ok to progress to your normal diet.  A half-sandwich or bowl of soup is an example of a good first meal.  Heavy or fried foods are harder to digest and may make you feel nauseous or bloated.  Likewise meals heavy in dairy and vegetables can cause extra gas to form and this can also increase the bloating.  Drink plenty of fluids but you should avoid alcoholic beverages for 24 hours.  ACTIVITY: Your care partner should take you home directly after the procedure.  You should plan to take it easy, moving slowly for the rest of the day.  You can resume normal activity the day after the procedure however you should NOT  DRIVE or use heavy machinery for 24 hours (because of the sedation medicines used during the test).    SYMPTOMS TO REPORT IMMEDIATELY: A gastroenterologist can be reached at any hour.  During normal business hours, 8:30 AM to 5:00 PM Monday through Friday, call [phone].  After hours and on weekends, please call the GI answering service at [phone] who will take a message and have the physician on call contact you.   Following lower endoscopy (colonoscopy or flexible sigmoidoscopy):  Excessive amounts of blood in the stool  Significant tenderness or worsening of abdominal pains  Swelling of the abdomen that is new, acute  Fever of 100F or higher  FOLLOW UP: If any biopsies were taken you will be contacted by phone or by letter within the next 1-3 weeks.  Call your gastroenterologist if you have not heard about the biopsies in 3 weeks.  Our staff will call the home number listed on your records the next business day following your procedure to check on you and address any questions or concerns that you may have at that time regarding the information given to you following your procedure. This is a courtesy call and so if there is no answer at the home number and we have not heard from you through the emergency physician on call, we will assume that you have returned to your regular daily activities without incident.  SIGNATURES/CONFIDENTIALITY: You and/or your care partner have signed paperwork  which will be entered into your electronic medical record.  These signatures attest to the fact that that the information above on your After Visit Summary has been reviewed and is understood.  Full responsibility of the confidentiality of this discharge information lies with you and/or your care-partner.

## 2013-10-22 NOTE — Progress Notes (Signed)
A/ox3 pleased with MAC, report to Annette RN 

## 2013-10-22 NOTE — Op Note (Signed)
[place]  [address]. [place], [id number]   COLONOSCOPY PROCEDURE REPORT  PATIENT: Alejandra, Miller  MR#: [id number] BIRTHDATE: [birth year] , 45  yrs. old GENDER: Female ENDOSCOPIST: Gatha Mayer, MD, [facility] REFERRED VB:TYOMA Charlett Blake, M.D. PROCEDURE DATE:  10/22/2013 PROCEDURE:   Colonoscopy, screening First Screening Colonoscopy - Avg.  risk and is 50 yrs.  old or older Yes.  Prior Negative Screening - Now for repeat screening. N/A  History of Adenoma - Now for follow-up colonoscopy & has been > or = to 3 yrs.  N/A  Polyps Removed Today? No.  Recommend repeat exam, <10 yrs? No. ASA CLASS:   Class III INDICATIONS:average risk screening and first colonoscopy. MEDICATIONS: propofol (Diprivan) 350mg  IV, MAC sedation, administered by CRNA, and These medications were titrated to patient response per physician's verbal order  DESCRIPTION OF PROCEDURE:   After the risks benefits and alternatives of the procedure were thoroughly explained, informed consent was obtained.  A digital rectal exam revealed no abnormalities of the rectum.   The [id number]  endoscope was introduced through the anus and advanced to the cecum, which was identified by both the appendix and ileocecal valve. No adverse events experienced.   The quality of the prep was excellent using Suprep  The instrument was then slowly withdrawn as the colon was fully examined.      COLON FINDINGS: A normal appearing cecum, ileocecal valve, and appendiceal orifice were identified.  The ascending, hepatic flexure, transverse, splenic flexure, descending, sigmoid colon and rectum appeared unremarkable.  No polyps or cancers were seen.   A right colon retroflexion was performed.  Retroflexed views revealed no abnormalities. The time to cecum=3 minutes 33 seconds. Withdrawal time=10 minutes 07 seconds.  The scope was withdrawn and the procedure completed. COMPLICATIONS: There were no  complications.  ENDOSCOPIC IMPRESSION: Normal colonoscopy - excellent prep - first colonoscopy  RECOMMENDATIONS: Repeat colonoscopy 10 years - 2025   eSigned:  Gatha Mayer, MD, [facility] 10/22/2013 12:15 PM   cc: Willette Alma, MD and The Patient

## 2013-10-23 ENCOUNTER — Telehealth: Payer: Self-pay | Admitting: *Deleted

## 2013-10-23 NOTE — Telephone Encounter (Signed)
  Follow up Call-  Call back number 10/22/2013  Post procedure Call Back phone  # [phone]  Permission to leave phone message Yes     Patient questions:  Left message to call us if necessary.

## 2013-10-23 NOTE — Telephone Encounter (Signed)
Appointment already has been scheduled for 11/02/13

## 2013-10-23 NOTE — Telephone Encounter (Signed)
Please inform pt we sent in 1 month of Celexa but an appt needs to be made for addt refills

## 2013-10-26 ENCOUNTER — Encounter: Payer: Self-pay | Admitting: Obstetrics and Gynecology

## 2013-10-26 ENCOUNTER — Ambulatory Visit (INDEPENDENT_AMBULATORY_CARE_PROVIDER_SITE_OTHER): Payer: 59 | Admitting: Obstetrics and Gynecology

## 2013-10-26 VITALS — BP 136/88 | HR 84 | Resp 18 | Ht 63.0 in | Wt 280.0 lb

## 2013-10-26 DIAGNOSIS — L98492 Non-pressure chronic ulcer of skin of other sites with fat layer exposed: Secondary | ICD-10-CM

## 2013-10-26 DIAGNOSIS — L98499 Non-pressure chronic ulcer of skin of other sites with unspecified severity: Secondary | ICD-10-CM

## 2013-10-26 DIAGNOSIS — N926 Irregular menstruation, unspecified: Secondary | ICD-10-CM

## 2013-10-26 MED ORDER — CIPROFLOXACIN HCL 500 MG PO TABS: 500.0000 mg | ORAL_TABLET | Freq: Two times a day (BID) | ORAL | Status: DC

## 2013-10-26 MED ORDER — MEDROXYPROGESTERONE ACETATE 10 MG PO TABS: 10.0000 mg | ORAL_TABLET | Freq: Every day | ORAL | Status: DC

## 2013-10-26 NOTE — Patient Instructions (Signed)
Please take the Ciprofloxacin 500 mg by mouth twice a day for 7 days.   Please take the Provera 10 mg by mouth once a day for 10 days, every 3 months.

## 2013-10-26 NOTE — Progress Notes (Signed)
GYNECOLOGY  VISIT   HPI: 53 y.o.   Married  Caucasian  female   G0P0 with Patient's last menstrual period was 07/17/2012.   here for   Breast Check and review of menstruation and hormonal lab testing.   Patient is being treated for right breast skin ulceration following a bug bite.   History of MRSA so placed on Bactrim DS while culture was pending.  Culture returned showing staphylococcus aureus sensitive to Bactrim and Cipro.  Resistant to Tetracycline and PCN. Feels the are is better but has not completely resolved.   Menstruation has been the following: About every 2 - 3 months having spotting/streaking.  Status post D & C with polypectomy in Sept. 2014 - benign polyps.  Normal menses in Oct.  Spotting in November and December.  Then skipped a few months.  When has bleeding, it is for 3 -4 days.  LMP was May 2015, not May 2014. FSH 53.7 and estradiol 33.5 on 10/12/13.  GYNECOLOGIC HISTORY: Patient's last menstrual period was 07/17/2012. Contraception:   Post Menopausal Menopausal hormone therapy: N/A        OB History   Grav Para Term Preterm Abortions TAB SAB Ect Mult Living   0                  Patient Active Problem List   Diagnosis Date Noted  . Skin ulcer 10/12/2013  . Cervical cancer screening 07/20/2012  . Obesity 02/12/2012  . Leg swelling 12/08/2011  . Annual physical exam 07/28/2011  . Menstrual irregularity 12/15/2010  . Vitamin D deficiency 12/15/2010  . OSA (obstructive sleep apnea) 08/11/2010  . Arthralgia 07/28/2010  . Hyperlipidemia 07/28/2010  . Hypertension 07/28/2010  . Depression 07/28/2010    Past Medical History  Diagnosis Date  . Depression     counseling in 2010 for 2 months on medication  . GERD (gastroesophageal reflux disease) 2005    treated with omeprazole  . Seasonal allergies     affected in the spring  . Hypertension 2002  . High cholesterol 2002  . History of sinusitis     once/twice per year  . Fibrocystic breast 1988   . Cervical cancer screening 07/20/2012    Menarche at 13, regular til recently No h/o abnormal paps, last pap 2-3 years ago G0P0  MGM today, no h/o abnormal MGMs Spotting now monthly and scant H/o uterine polyps  . Uterine polyp   . Anxiety     NO MEDS  . OSA (obstructive sleep apnea)     +sleep study 08/2010., DOES NOT USE CPAP, USES MOSES MOUTH PIECE  . Osteoarthritis     KNEES, HANDS, HIPS  . Sleep apnea     mouthpiece "[facility]"  . MRSA (methicillin resistant staph aureus) culture positive 2007    Past Surgical History  Procedure Laterality Date  . Tonsillectomy  1969  . Wrist fracture surgery Right 07/2012  . Wisdom tooth extraction  1979  . Dilatation & currettage/hysteroscopy with resectocope N/A 11/24/2012    Procedure: DILATATION & CURETTAGE/HYSTEROSCOPY WITH RESECTOCOPE;  Surgeon: Arloa Koh, MD;  Location: [facility];  Service: Gynecology;  Laterality: N/A;    Current Outpatient Prescriptions  Medication Sig Dispense Refill  . amLODipine (NORVASC) 5 MG tablet take 1 tablet by mouth once daily  30 tablet  3  . atorvastatin (LIPITOR) 20 MG tablet take 1 tablet by mouth once daily  30 tablet  3  . bisoprolol-hydrochlorothiazide (ZIAC) 5-6.25 MG per tablet TAKE 2 TABLETS  DAILY  60 tablet  2  . buPROPion (WELLBUTRIN SR) 150 MG 12 hr tablet take 1 tablet by mouth twice a day  60 tablet  4  . Cholecalciferol (VITAMIN D3) 1000 UNITS CAPS Take 1 capsule by mouth daily.        . citalopram (CELEXA) 20 MG tablet take 1 tablet by mouth once daily  30 tablet  0  . cyclobenzaprine (FLEXERIL) 10 MG tablet take 1 tablet by mouth daily if needed  30 tablet  3  . Krill Oil 500 MG CAPS Take 500 mg by mouth daily.      . Multiple Vitamin (MULTIVITAMIN) tablet Take 1 tablet by mouth daily.        Marland Kitchen NAPROXEN PO Take by mouth as needed.      Marland Kitchen VITAMIN A PO Take 1,000 Units by mouth daily.      Marland Kitchen omeprazole (PRILOSEC) 20 MG capsule Take 1 capsule (20 mg total) by mouth daily.  30 capsule  5   No  current facility-administered medications for this visit.     ALLERGIES: Review of patient's allergies indicates no known allergies.  Family History  Problem Relation Age of Onset  . Osteoarthritis Mother   . Hyperlipidemia Mother   . Depression Mother   . Liver cancer Maternal Grandmother     mets to breast  . Breast cancer Maternal Grandmother     liver mets to breast  . Hyperlipidemia Father   . Coronary artery disease Father   . Hypertension Father   . Stroke Father     multiple  . Diabetes Father   . Colon cancer Neg Hx     History   Social History  . Marital Status: Married    Spouse Name: N/A    Number of Children: N/A  . Years of Education: N/A   Occupational History  . Not on file.   Social History Main Topics  . Smoking status: Former Smoker -- 0.50 packs/day for 6 years    Types: Cigarettes    Quit date: 03/01/1989  . Smokeless tobacco: Never Used     Comment: Quit in 1991- started in 1983 1/2ppd  . Alcohol Use: No  . Drug Use: No  . Sexual Activity: Yes    Partners: Male    Birth Control/ Protection: None, Other-see comments     Comment: vasectomy   Other Topics Concern  . Not on file   Social History Narrative  . No narrative on file    ROS:  Pertinent items are noted in HPI.  PHYSICAL EXAMINATION:    BP 136/88  Pulse 84  Resp 18  Ht 5\' 3"  (1.6 m)  Wt 280 lb (127.007 kg)  BMI 49.61 kg/m2  LMP 07/17/2012     General appearance: alert, cooperative and appears stated age Right breast with 3 ulcerated areas - largest is 1.0 cm, others are 0.75 cm.  Still has 4 mm induration of the left superior breast at the site of the insect bite.  No generalized erythema.   ASSESSMENT  Breast skin ulceration - improving on Bactrim, but not completely resolved. Staphylococcus aureus. Perimenopausal female. Obesity.   PLAN  Will treat with Ciprofloxacin 500 mg po bid for 7 days.  Instructed in Provera 10 mg po q d for 10 days.  Will repeat this  in 3 months as well. Recheck in 10 days.  Will need mammogram after breast ulceration is healed.  Will need follow up of menstrual cycles and  report back to office if she is having a period or not.   An After Visit Summary was printed and given to the patient.  ___15___ minutes face to face time of which over 50% was spent in counseling.

## 2013-10-29 ENCOUNTER — Other Ambulatory Visit: Payer: Self-pay | Admitting: Family Medicine

## 2013-10-29 LAB — RENAL FUNCTION PANEL
Albumin: 4.2 g/dL (ref 3.5–5.2)
BUN: 17 mg/dL (ref 6–23)
CALCIUM: 9.6 mg/dL (ref 8.4–10.5)
CO2: 27 meq/L (ref 19–32)
Chloride: 104 mEq/L (ref 96–112)
Creat: 0.91 mg/dL (ref 0.50–1.10)
Glucose, Bld: 108 mg/dL — ABNORMAL HIGH (ref 70–99)
Phosphorus: 2.8 mg/dL (ref 2.3–4.6)
Potassium: 4.4 mEq/L (ref 3.5–5.3)
SODIUM: 141 meq/L (ref 135–145)

## 2013-10-29 LAB — HEPATIC FUNCTION PANEL
ALBUMIN: 4.2 g/dL (ref 3.5–5.2)
ALT: 26 U/L (ref 0–35)
AST: 18 U/L (ref 0–37)
Alkaline Phosphatase: 80 U/L (ref 39–117)
Bilirubin, Direct: 0.1 mg/dL (ref 0.0–0.3)
Indirect Bilirubin: 0.4 mg/dL (ref 0.2–1.2)
TOTAL PROTEIN: 6.4 g/dL (ref 6.0–8.3)
Total Bilirubin: 0.5 mg/dL (ref 0.2–1.2)

## 2013-10-29 LAB — LIPID PANEL
CHOL/HDL RATIO: 3.7 ratio
Cholesterol: 149 mg/dL (ref 0–200)
HDL: 40 mg/dL (ref >39)
LDL Cholesterol: 82 mg/dL (ref 0–99)
Triglycerides: 134 mg/dL (ref <150)
VLDL: 27 mg/dL (ref 0–40)

## 2013-10-29 LAB — TSH: TSH: 0.847 u[IU]/mL (ref 0.350–4.500)

## 2013-10-29 LAB — CBC
HCT: 39.8 % (ref 36.0–46.0)
Hemoglobin: 13 g/dL (ref 12.0–15.0)
MCH: 27.4 pg (ref 26.0–34.0)
MCHC: 32.7 g/dL (ref 30.0–36.0)
MCV: 84 fL (ref 78.0–100.0)
PLATELETS: 361 10*3/uL (ref 150–400)
RBC: 4.74 MIL/uL (ref 3.87–5.11)
RDW: 13.7 % (ref 11.5–15.5)
WBC: 6.5 10*3/uL (ref 4.0–10.5)

## 2013-11-02 ENCOUNTER — Encounter: Payer: Self-pay | Admitting: Family Medicine

## 2013-11-02 ENCOUNTER — Encounter: Payer: Self-pay | Admitting: Obstetrics and Gynecology

## 2013-11-02 ENCOUNTER — Ambulatory Visit (INDEPENDENT_AMBULATORY_CARE_PROVIDER_SITE_OTHER): Payer: 59 | Admitting: Family Medicine

## 2013-11-02 ENCOUNTER — Ambulatory Visit (INDEPENDENT_AMBULATORY_CARE_PROVIDER_SITE_OTHER): Payer: 59 | Admitting: Obstetrics and Gynecology

## 2013-11-02 VITALS — BP 124/80 | HR 64 | Resp 20 | Ht 63.0 in | Wt 274.0 lb

## 2013-11-02 VITALS — BP 122/72 | HR 78 | Temp 98.1°F | Ht 63.0 in | Wt 275.8 lb

## 2013-11-02 DIAGNOSIS — Z23 Encounter for immunization: Secondary | ICD-10-CM

## 2013-11-02 DIAGNOSIS — Z0000 Encounter for general adult medical examination without abnormal findings: Secondary | ICD-10-CM

## 2013-11-02 DIAGNOSIS — E785 Hyperlipidemia, unspecified: Secondary | ICD-10-CM

## 2013-11-02 DIAGNOSIS — M7551 Bursitis of right shoulder: Secondary | ICD-10-CM

## 2013-11-02 DIAGNOSIS — N926 Irregular menstruation, unspecified: Secondary | ICD-10-CM

## 2013-11-02 DIAGNOSIS — L98499 Non-pressure chronic ulcer of skin of other sites with unspecified severity: Secondary | ICD-10-CM

## 2013-11-02 DIAGNOSIS — L98492 Non-pressure chronic ulcer of skin of other sites with fat layer exposed: Secondary | ICD-10-CM

## 2013-11-02 DIAGNOSIS — M67919 Unspecified disorder of synovium and tendon, unspecified shoulder: Secondary | ICD-10-CM

## 2013-11-02 DIAGNOSIS — I10 Essential (primary) hypertension: Secondary | ICD-10-CM

## 2013-11-02 DIAGNOSIS — N939 Abnormal uterine and vaginal bleeding, unspecified: Secondary | ICD-10-CM

## 2013-11-02 DIAGNOSIS — M719 Bursopathy, unspecified: Secondary | ICD-10-CM

## 2013-11-02 LAB — POCT URINALYSIS DIPSTICK

## 2013-11-02 LAB — POCT URINE PREGNANCY: Preg Test, Ur: NEGATIVE

## 2013-11-02 MED ORDER — PREDNISONE 20 MG PO TABS: 40.0000 mg | ORAL_TABLET | Freq: Every day | ORAL | Status: DC

## 2013-11-02 MED ORDER — MUPIROCIN CALCIUM 2 % EX CREA: 1.0000 "application " | TOPICAL_CREAM | Freq: Three times a day (TID) | CUTANEOUS | Status: DC

## 2013-11-02 NOTE — Progress Notes (Signed)
GYNECOLOGY  VISIT   HPI: 53 y.o.   Married  Caucasian  female   G0P0 with Patient's last menstrual period was 07/17/2012.   here for  Breast Check   Took the scabs off the breast ulceration area.  Taking the antibiotics.   Took one course of Bactrim and then course of Cipro. MRSA culture was negative.   Feels PMS symptoms from Provera. Taking for 7 days.  Tolerable.   GYNECOLOGIC HISTORY: Patient's last menstrual period was 07/17/2012. Contraception:  Post-Menopausal   Menopausal hormone therapy: Provera 10mg          OB History   Grav Para Term Preterm Abortions TAB SAB Ect Mult Living   0                  Patient Active Problem List   Diagnosis Date Noted  . Skin ulcer 10/12/2013  . Cervical cancer screening 07/20/2012  . Obesity 02/12/2012  . Leg swelling 12/08/2011  . Annual physical exam 07/28/2011  . Menstrual irregularity 12/15/2010  . Vitamin D deficiency 12/15/2010  . OSA (obstructive sleep apnea) 08/11/2010  . Arthralgia 07/28/2010  . Hyperlipidemia 07/28/2010  . Hypertension 07/28/2010  . Depression 07/28/2010    Past Medical History  Diagnosis Date  . Depression     counseling in 2010 for 2 months on medication  . GERD (gastroesophageal reflux disease) 2005    treated with omeprazole  . Seasonal allergies     affected in the spring  . Hypertension 2002  . High cholesterol 2002  . History of sinusitis     once/twice per year  . Fibrocystic breast 1988  . Cervical cancer screening 07/20/2012    Menarche at 13, regular til recently No h/o abnormal paps, last pap 2-3 years ago G0P0  MGM today, no h/o abnormal MGMs Spotting now monthly and scant H/o uterine polyps  . Uterine polyp   . Anxiety     NO MEDS  . OSA (obstructive sleep apnea)     +sleep study 08/2010., DOES NOT USE CPAP, USES MOSES MOUTH PIECE  . Osteoarthritis     KNEES, HANDS, HIPS  . Sleep apnea     mouthpiece "[facility]"  . MRSA (methicillin resistant staph aureus) culture positive  2007    Past Surgical History  Procedure Laterality Date  . Tonsillectomy  1969  . Wrist fracture surgery Right 07/2012  . Wisdom tooth extraction  1979  . Dilatation & currettage/hysteroscopy with resectocope N/A 11/24/2012    Procedure: DILATATION & CURETTAGE/HYSTEROSCOPY WITH RESECTOCOPE;  Surgeon: Arloa Koh, MD;  Location: [facility];  Service: Gynecology;  Laterality: N/A;    Current Outpatient Prescriptions  Medication Sig Dispense Refill  . amLODipine (NORVASC) 5 MG tablet take 1 tablet by mouth once daily  30 tablet  3  . atorvastatin (LIPITOR) 20 MG tablet take 1 tablet by mouth once daily  30 tablet  3  . bisoprolol-hydrochlorothiazide (ZIAC) 5-6.25 MG per tablet TAKE 2 TABLETS DAILY  60 tablet  2  . buPROPion (WELLBUTRIN SR) 150 MG 12 hr tablet take 1 tablet by mouth twice a day  60 tablet  4  . Cholecalciferol (VITAMIN D3) 1000 UNITS CAPS Take 1 capsule by mouth daily.        . ciprofloxacin (CIPRO) 500 MG tablet Take 1 tablet (500 mg total) by mouth 2 (two) times daily.  14 tablet  0  . citalopram (CELEXA) 20 MG tablet take 1 tablet by mouth once daily  30 tablet  0  . cyclobenzaprine (FLEXERIL) 10 MG tablet take 1 tablet by mouth daily if needed  30 tablet  3  . Krill Oil 500 MG CAPS Take 500 mg by mouth daily.      . medroxyPROGESTERone (PROVERA) 10 MG tablet Take 1 tablet (10 mg total) by mouth daily. Take for 10 days in a row every 3 months.  10 tablet  1  . Multiple Vitamin (MULTIVITAMIN) tablet Take 1 tablet by mouth daily.        Marland Kitchen NAPROXEN PO Take by mouth as needed.      Marland Kitchen VITAMIN A PO Take 10,000 Units by mouth daily.       Marland Kitchen omeprazole (PRILOSEC) 20 MG capsule Take 1 capsule (20 mg total) by mouth daily.  30 capsule  5   No current facility-administered medications for this visit.     ALLERGIES: Review of patient's allergies indicates no known allergies.  Family History  Problem Relation Age of Onset  . Osteoarthritis Mother   . Hyperlipidemia Mother   .  Depression Mother   . Liver cancer Maternal Grandmother     mets to breast  . Breast cancer Maternal Grandmother     liver mets to breast  . Hyperlipidemia Father   . Coronary artery disease Father   . Hypertension Father   . Stroke Father     multiple  . Diabetes Father   . Colon cancer Neg Hx     History   Social History  . Marital Status: Married    Spouse Name: N/A    Number of Children: N/A  . Years of Education: N/A   Occupational History  . Not on file.   Social History Main Topics  . Smoking status: Former Smoker -- 0.50 packs/day for 6 years    Types: Cigarettes    Quit date: 03/01/1989  . Smokeless tobacco: Never Used     Comment: Quit in 1991- started in 1983 1/2ppd  . Alcohol Use: No  . Drug Use: No  . Sexual Activity: Yes    Partners: Male    Birth Control/ Protection: None, Other-see comments     Comment: vasectomy   Other Topics Concern  . Not on file   Social History Narrative  . No narrative on file    ROS:  Pertinent items are noted in HPI.  PHYSICAL EXAMINATION:    BP 124/80  Pulse 64  Resp 20  Ht 5\' 3"  (1.6 m)  Wt 274 lb (124.286 kg)  BMI 48.55 kg/m2  LMP 07/17/2012     General appearance: alert, cooperative and appears stated age  Breast:  Left breast with 3 small ulcerated areas - ranging from 1.0 cm to 1.5 cm in size, eschars off.  No surrounding erythema.   ASSESSMENT  Breast ulceration.  Minimally improved.  Perimenopausal female.  PLAN  Will treat with Bactroban tid for one week and then recheck.  I have encouraged patient to stop removing the eschars. Will keep the areas covered.  May still need to see dermatology. Continue with Provera 10 mg for 10 days every 3 months.  Will need a recheck for December or January regarding menstrual cycles.    An After Visit Summary was printed and given to the patient.  _15_____ minutes face to face time of which over 50% was spent in counseling.

## 2013-11-02 NOTE — Patient Instructions (Signed)
Please keep the areas of the breast covered and use the Bactroban three times a day.

## 2013-11-02 NOTE — Patient Instructions (Addendum)
Salon Pas patches or gel. Witch Hazel astringent  Hypertension Hypertension, commonly called high blood pressure, is when the force of blood pumping through your arteries is too strong. Your arteries are the blood vessels that carry blood from your heart throughout your body. A blood pressure reading consists of a higher number over a lower number, such as 110/72. The higher number (systolic) is the pressure inside your arteries when your heart pumps. The lower number (diastolic) is the pressure inside your arteries when your heart relaxes. Ideally you want your blood pressure below 120/80. Hypertension forces your heart to work harder to pump blood. Your arteries may become narrow or stiff. Having hypertension puts you at risk for heart disease, stroke, and other problems.  RISK FACTORS Some risk factors for high blood pressure are controllable. Others are not.  Risk factors you cannot control include:   Race. You may be at higher risk if you are African American.  Age. Risk increases with age.  Gender. Men are at higher risk than women before age 33 years. After age 27, women are at higher risk than men. Risk factors you can control include:  Not getting enough exercise or physical activity.  Being overweight.  Getting too much fat, sugar, calories, or salt in your diet.  Drinking too much alcohol. SIGNS AND SYMPTOMS Hypertension does not usually cause signs or symptoms. Extremely high blood pressure (hypertensive crisis) may cause headache, anxiety, shortness of breath, and nosebleed. DIAGNOSIS  To check if you have hypertension, your health care provider will measure your blood pressure while you are seated, with your arm held at the level of your heart. It should be measured at least twice using the same arm. Certain conditions can cause a difference in blood pressure between your right and left arms. A blood pressure reading that is higher than normal on one occasion does not mean  that you need treatment. If one blood pressure reading is high, ask your health care provider about having it checked again. TREATMENT  Treating high blood pressure includes making lifestyle changes and possibly taking medicine. Living a healthy lifestyle can help lower high blood pressure. You may need to change some of your habits. Lifestyle changes may include:  Following the DASH diet. This diet is high in fruits, vegetables, and whole grains. It is low in salt, red meat, and added sugars.  Getting at least 2 hours of brisk physical activity every week.  Losing weight if necessary.  Not smoking.  Limiting alcoholic beverages.  Learning ways to reduce stress. If lifestyle changes are not enough to get your blood pressure under control, your health care provider may prescribe medicine. You may need to take more than one. Work closely with your health care provider to understand the risks and benefits. HOME CARE INSTRUCTIONS  Have your blood pressure rechecked as directed by your health care provider.   Take medicines only as directed by your health care provider. Follow the directions carefully. Blood pressure medicines must be taken as prescribed. The medicine does not work as well when you skip doses. Skipping doses also puts you at risk for problems.   Do not smoke.   Monitor your blood pressure at home as directed by your health care provider. SEEK MEDICAL CARE IF:   You think you are having a reaction to medicines taken.  You have recurrent headaches or feel dizzy.  You have swelling in your ankles.  You have trouble with your vision. [place]  IF:  You develop a severe headache or confusion.  You have unusual weakness, numbness, or feel faint.  You have severe chest or abdominal pain.  You vomit repeatedly.  You have trouble breathing. MAKE SURE YOU:   Understand these instructions.  Will watch your condition.  Will get help right  away if you are not doing well or get worse. Document Released: 02/15/2005 Document Revised: 07/02/2013 Document Reviewed: 12/08/2012 [facility] Patient Information 2015 [place], [place]. This information is not intended to replace advice given to you by your health care provider. Make sure you discuss any questions you have with your health care provider.

## 2013-11-02 NOTE — Progress Notes (Signed)
Patient ID: Alejandra Miller, female   DOB: Oct 03, 1960, 53 y.o.   MRN: [id number] Alejandra Miller [id number] 1960-03-08 11/02/2013      Progress Note-Follow Up  Subjective  Chief Complaint  Chief Complaint  Patient presents with  . Annual Exam    physical  . Injections    flu    HPI  Patient is a 53 year old female in today for routine medical care. Doing well. Does note some right shoulder pain recently. Has tried chiropractic adjustment without any great results. Has pain with movement but no radicular symptoms down the arm. Has some pain in her right neck as well. No recent injury or trauma. No other recent illness. Denies CP/palp/SOB/HA/congestion/fevers/GI or GU c/o. Taking meds as prescribed  Past Medical History  Diagnosis Date  . Depression     counseling in 2010 for 2 months on medication  . GERD (gastroesophageal reflux disease) 2005    treated with omeprazole  . Seasonal allergies     affected in the spring  . Hypertension 2002  . High cholesterol 2002  . History of sinusitis     once/twice per year  . Fibrocystic breast 1988  . Cervical cancer screening 07/20/2012    Menarche at 13, regular til recently No h/o abnormal paps, last pap 2-3 years ago G0P0  MGM today, no h/o abnormal MGMs Spotting now monthly and scant H/o uterine polyps  . Uterine polyp   . Anxiety     NO MEDS  . OSA (obstructive sleep apnea)     +sleep study 08/2010., DOES NOT USE CPAP, USES MOSES MOUTH PIECE  . Osteoarthritis     KNEES, HANDS, HIPS  . Sleep apnea     mouthpiece "[facility]"  . MRSA (methicillin resistant staph aureus) culture positive 2007    Past Surgical History  Procedure Laterality Date  . Tonsillectomy  1969  . Wrist fracture surgery Right 07/2012  . Wisdom tooth extraction  1979  . Dilatation & currettage/hysteroscopy with resectocope N/A 11/24/2012    Procedure: DILATATION & CURETTAGE/HYSTEROSCOPY WITH RESECTOCOPE;  Surgeon: Arloa Koh, MD;  Location: [facility];  Service: Gynecology;   Laterality: N/A;    Family History  Problem Relation Age of Onset  . Osteoarthritis Mother   . Hyperlipidemia Mother   . Depression Mother   . Liver cancer Maternal Grandmother     mets to breast  . Breast cancer Maternal Grandmother     liver mets to breast  . Hyperlipidemia Father   . Coronary artery disease Father   . Hypertension Father   . Stroke Father     multiple  . Diabetes Father   . Colon cancer Neg Hx     History   Social History  . Marital Status: Married    Spouse Name: N/A    Number of Children: N/A  . Years of Education: N/A   Occupational History  . Not on file.   Social History Main Topics  . Smoking status: Former Smoker -- 0.50 packs/day for 6 years    Types: Cigarettes    Quit date: 03/01/1989  . Smokeless tobacco: Never Used     Comment: Quit in 1991- started in 1983 1/2ppd  . Alcohol Use: No  . Drug Use: No  . Sexual Activity: Yes    Partners: Male    Birth Control/ Protection: None, Other-see comments     Comment: vasectomy   Other Topics Concern  . Not on file   Social History  Narrative  . No narrative on file    Current Outpatient Prescriptions on File Prior to Visit  Medication Sig Dispense Refill  . amLODipine (NORVASC) 5 MG tablet take 1 tablet by mouth once daily  30 tablet  3  . atorvastatin (LIPITOR) 20 MG tablet take 1 tablet by mouth once daily  30 tablet  3  . bisoprolol-hydrochlorothiazide (ZIAC) 5-6.25 MG per tablet TAKE 2 TABLETS DAILY  60 tablet  2  . buPROPion (WELLBUTRIN SR) 150 MG 12 hr tablet take 1 tablet by mouth twice a day  60 tablet  4  . Cholecalciferol (VITAMIN D3) 1000 UNITS CAPS Take 1 capsule by mouth daily.        . ciprofloxacin (CIPRO) 500 MG tablet Take 1 tablet (500 mg total) by mouth 2 (two) times daily.  14 tablet  0  . citalopram (CELEXA) 20 MG tablet take 1 tablet by mouth once daily  30 tablet  0  . cyclobenzaprine (FLEXERIL) 10 MG tablet take 1 tablet by mouth daily if needed  30 tablet  3   . Krill Oil 500 MG CAPS Take 500 mg by mouth daily.      . medroxyPROGESTERone (PROVERA) 10 MG tablet Take 1 tablet (10 mg total) by mouth daily. Take for 10 days in a row every 3 months.  10 tablet  1  . Multiple Vitamin (MULTIVITAMIN) tablet Take 1 tablet by mouth daily.        Marland Kitchen NAPROXEN PO Take by mouth as needed.      Marland Kitchen omeprazole (PRILOSEC) 20 MG capsule Take 1 capsule (20 mg total) by mouth daily.  30 capsule  5  . VITAMIN A PO Take 10,000 Units by mouth daily.        No current facility-administered medications on file prior to visit.    No Known Allergies  Review of Systems  Review of Systems  Constitutional: Negative for fever and malaise/fatigue.  HENT: Negative for congestion.   Eyes: Negative for discharge.  Respiratory: Negative for shortness of breath.   Cardiovascular: Negative for chest pain, palpitations and leg swelling.  Gastrointestinal: Negative for nausea, abdominal pain and diarrhea.  Genitourinary: Negative for dysuria.  Musculoskeletal: Negative for falls.  Skin: Negative for rash.  Neurological: Negative for loss of consciousness and headaches.  Endo/Heme/Allergies: Negative for polydipsia.  Psychiatric/Behavioral: Negative for depression and suicidal ideas. The patient is not nervous/anxious and does not have insomnia.     Objective  BP 122/72  Pulse 78  Temp(Src) 98.1 F (36.7 C) (Oral)  Ht 5\' 3"  (1.6 m)  Wt 275 lb 12.8 oz (125.102 kg)  BMI 48.87 kg/m2  SpO2 97%  LMP 07/17/2012  Physical Exam  Physical Exam  Constitutional: She is oriented to person, place, and time and well-developed, well-nourished, and in no distress. No distress.  HENT:  Head: Normocephalic and atraumatic.  Eyes: Conjunctivae are normal.  Neck: Neck supple. No thyromegaly present.  Cardiovascular: Normal rate, regular rhythm and normal heart sounds.   No murmur heard. Pulmonary/Chest: Effort normal and breath sounds normal. She has no wheezes.  Abdominal: She  exhibits no distension and no mass.  Musculoskeletal: She exhibits no edema.  Lymphadenopathy:    She has no cervical adenopathy.  Neurological: She is alert and oriented to person, place, and time.  Skin: Skin is warm and dry. No rash noted. She is not diaphoretic.  Psychiatric: Memory, affect and judgment normal.    Lab Results  Component Value Date  TSH 0.847 10/29/2013   Lab Results  Component Value Date   WBC 6.5 10/29/2013   HGB 13.0 10/29/2013   HCT 39.8 10/29/2013   MCV 84.0 10/29/2013   PLT 361 10/29/2013   Lab Results  Component Value Date   CREATININE 0.91 10/29/2013   BUN 17 10/29/2013   NA 141 10/29/2013   K 4.4 10/29/2013   CL 104 10/29/2013   CO2 27 10/29/2013   Lab Results  Component Value Date   ALT 26 10/29/2013   AST 18 10/29/2013   ALKPHOS 80 10/29/2013   BILITOT 0.5 10/29/2013   Lab Results  Component Value Date   CHOL 149 10/29/2013   Lab Results  Component Value Date   HDL 40 10/29/2013   Lab Results  Component Value Date   LDLCALC 82 10/29/2013   Lab Results  Component Value Date   TRIG 134 10/29/2013   Lab Results  Component Value Date   CHOLHDL 3.7 10/29/2013     Assessment & Plan  Hypertension Well controlled, no changes to meds. Encouraged heart healthy diet such as the DASH diet and exercise as tolerated.   Hyperlipidemia Tolerating statin, encouraged heart healthy diet, avoid trans fats, minimize simple carbs and saturated fats. Increase exercise as tolerated  Bursitis of shoulder Will try a course of Prednisone, Flexeril and rest, if no improvement will need imaging and referral  Annual physical exam Patient encouraged to maintain heart healthy diet, regular exercise, adequate sleep. Consider daily probiotics. Take medications as prescribed. Pap 2014 normal, MGM due she agrees to proceed. Flu shot today. Colonoscopy in August of 2015

## 2013-11-02 NOTE — Progress Notes (Signed)
Pre visit review using our clinic review tool, if applicable. No additional management support is needed unless otherwise documented below in the visit note. 

## 2013-11-05 DIAGNOSIS — M7550 Bursitis of unspecified shoulder: Secondary | ICD-10-CM | POA: Insufficient documentation

## 2013-11-05 NOTE — Assessment & Plan Note (Signed)
Tolerating statin, encouraged heart healthy diet, avoid trans fats, minimize simple carbs and saturated fats. Increase exercise as tolerated 

## 2013-11-05 NOTE — Assessment & Plan Note (Signed)
Well controlled, no changes to meds. Encouraged heart healthy diet such as the DASH diet and exercise as tolerated.  °

## 2013-11-05 NOTE — Assessment & Plan Note (Signed)
Patient encouraged to maintain heart healthy diet, regular exercise, adequate sleep. Consider daily probiotics. Take medications as prescribed. Pap 2014 normal, MGM due she agrees to proceed. Flu shot today. Colonoscopy in August of 2015

## 2013-11-05 NOTE — Assessment & Plan Note (Signed)
Will try a course of Prednisone, Flexeril and rest, if no improvement will need imaging and referral

## 2013-11-09 ENCOUNTER — Telehealth: Payer: Self-pay | Admitting: Obstetrics and Gynecology

## 2013-11-09 ENCOUNTER — Ambulatory Visit: Payer: 59 | Admitting: Obstetrics and Gynecology

## 2013-11-09 NOTE — Telephone Encounter (Signed)
Patient canceled her 1 week recheck today at 2:45pm. Patient feels like she may have a stomach bug. Patient rescheduled to next week.

## 2013-11-09 NOTE — Telephone Encounter (Signed)
Thank you.  No DNKA fee.  Please let me know if the patient reschedules the next appointment.  I am following her for an active problem.

## 2013-11-15 ENCOUNTER — Ambulatory Visit (INDEPENDENT_AMBULATORY_CARE_PROVIDER_SITE_OTHER): Payer: 59 | Admitting: Obstetrics and Gynecology

## 2013-11-15 ENCOUNTER — Encounter: Payer: Self-pay | Admitting: Obstetrics and Gynecology

## 2013-11-15 VITALS — BP 130/84 | HR 76 | Ht 63.0 in | Wt 273.0 lb

## 2013-11-15 DIAGNOSIS — L988 Other specified disorders of the skin and subcutaneous tissue: Secondary | ICD-10-CM

## 2013-11-15 DIAGNOSIS — N649 Disorder of breast, unspecified: Secondary | ICD-10-CM

## 2013-11-15 NOTE — Progress Notes (Signed)
Patient ID: Alejandra Miller, female   DOB: Sep 19, 1960, 53 y.o.   MRN: [id number] GYNECOLOGY  VISIT   HPI: 53 y.o.   Married  Caucasian  female   G0P0 with Patient's last menstrual period was 07/17/2012.   here for  Follow up visit--recheck right breast.  Patient states feels and looks better. Breast is feeling better.  Still using the Mupirocin cream on November 06, 2013.   Took last day of Provera on 11/05/13.  Menses started on 11/10/13.  First 3 days were heavy and had cramping.   Decreased sex drive for 2 + years.  Is able to enjoy when she engages.   GYNECOLOGIC HISTORY: Patient's last menstrual period was 07/17/2012. Contraception:  postmenopausal  Menopausal hormone therapy: Provera 10mg         OB History   Grav Para Term Preterm Abortions TAB SAB Ect Mult Living   0                  Patient Active Problem List   Diagnosis Date Noted  . Bursitis of shoulder 11/05/2013  . Skin ulcer 10/12/2013  . Cervical cancer screening 07/20/2012  . Obesity 02/12/2012  . Leg swelling 12/08/2011  . Annual physical exam 07/28/2011  . Menstrual irregularity 12/15/2010  . Vitamin D deficiency 12/15/2010  . OSA (obstructive sleep apnea) 08/11/2010  . Arthralgia 07/28/2010  . Hyperlipidemia 07/28/2010  . Hypertension 07/28/2010  . Depression 07/28/2010    Past Medical History  Diagnosis Date  . Depression     counseling in 2010 for 2 months on medication  . GERD (gastroesophageal reflux disease) 2005    treated with omeprazole  . Seasonal allergies     affected in the spring  . Hypertension 2002  . High cholesterol 2002  . History of sinusitis     once/twice per year  . Fibrocystic breast 1988  . Cervical cancer screening 07/20/2012    Menarche at 13, regular til recently No h/o abnormal paps, last pap 2-3 years ago G0P0  MGM today, no h/o abnormal MGMs Spotting now monthly and scant H/o uterine polyps  . Uterine polyp   . Anxiety     NO MEDS  . OSA (obstructive sleep apnea)      +sleep study 08/2010., DOES NOT USE CPAP, USES MOSES MOUTH PIECE  . Osteoarthritis     KNEES, HANDS, HIPS  . Sleep apnea     mouthpiece "[facility]"  . MRSA (methicillin resistant staph aureus) culture positive 2007    Past Surgical History  Procedure Laterality Date  . Tonsillectomy  1969  . Wrist fracture surgery Right 07/2012  . Wisdom tooth extraction  1979  . Dilatation & currettage/hysteroscopy with resectocope N/A 11/24/2012    Procedure: DILATATION & CURETTAGE/HYSTEROSCOPY WITH RESECTOCOPE;  Surgeon: Arloa Koh, MD;  Location: [facility];  Service: Gynecology;  Laterality: N/A;    Current Outpatient Prescriptions  Medication Sig Dispense Refill  . amLODipine (NORVASC) 5 MG tablet take 1 tablet by mouth once daily  30 tablet  3  . atorvastatin (LIPITOR) 20 MG tablet take 1 tablet by mouth once daily  30 tablet  3  . bisoprolol-hydrochlorothiazide (ZIAC) 5-6.25 MG per tablet TAKE 2 TABLETS DAILY  60 tablet  2  . buPROPion (WELLBUTRIN SR) 150 MG 12 hr tablet take 1 tablet by mouth twice a day  60 tablet  4  . Cholecalciferol (VITAMIN D3) 1000 UNITS CAPS Take 1 capsule by mouth daily.        Marland Kitchen  citalopram (CELEXA) 20 MG tablet take 1 tablet by mouth once daily  30 tablet  0  . cyclobenzaprine (FLEXERIL) 10 MG tablet take 1 tablet by mouth daily if needed  30 tablet  3  . Krill Oil 500 MG CAPS Take 500 mg by mouth daily.      . medroxyPROGESTERone (PROVERA) 10 MG tablet Take 1 tablet (10 mg total) by mouth daily. Take for 10 days in a row every 3 months.  10 tablet  1  . Multiple Vitamin (MULTIVITAMIN) tablet Take 1 tablet by mouth daily.        . mupirocin cream (BACTROBAN) 2 % Apply 1 application topically 3 (three) times daily.  30 g  0  . NAPROXEN PO Take by mouth as needed.      Marland Kitchen VITAMIN A PO Take 10,000 Units by mouth daily.       Marland Kitchen omeprazole (PRILOSEC) 20 MG capsule Take 1 capsule (20 mg total) by mouth daily.  30 capsule  5   No current facility-administered medications for this  visit.     ALLERGIES: Review of patient's allergies indicates no known allergies.  Family History  Problem Relation Age of Onset  . Osteoarthritis Mother   . Hyperlipidemia Mother   . Depression Mother   . Liver cancer Maternal Grandmother     mets to breast  . Breast cancer Maternal Grandmother     liver mets to breast  . Cancer Maternal Grandmother     with mets  . Hyperlipidemia Father   . Coronary artery disease Father   . Hypertension Father   . Stroke Father     multiple  . Diabetes Father   . Colon cancer Neg Hx     History   Social History  . Marital Status: Married    Spouse Name: N/A    Number of Children: N/A  . Years of Education: N/A   Occupational History  . Not on file.   Social History Main Topics  . Smoking status: Former Smoker -- 0.50 packs/day for 6 years    Types: Cigarettes    Quit date: 03/01/1989  . Smokeless tobacco: Never Used     Comment: Quit in 1991- started in 1983 1/2ppd  . Alcohol Use: No  . Drug Use: No  . Sexual Activity: Yes    Partners: Male    Birth Control/ Protection: None, Other-see comments     Comment: vasectomy   Other Topics Concern  . Not on file   Social History Narrative  . No narrative on file    ROS:  Pertinent items are noted in HPI.  PHYSICAL EXAMINATION:    BP 130/84  Pulse 76  Ht 5\' 3"  (1.6 m)  Wt 273 lb (123.832 kg)  BMI 48.37 kg/m2  LMP 07/17/2012     General appearance: alert, cooperative and appears stated age   Right breast: 3 areas of right lateral breast. Eschar on 1 skin lesion - total area of 1 cm.  Other 2 have epithelialized now.   ASSESSMENT  Skin breast lesions.  Using Mupirocin.  Decreased libido.  Needs mammogram.  Periodic provera for control of cycles.   PLAN  Return in 2 -3 weeks for recheck. Plan for scheduling mammogram then.  Continue Provera cycling every 3 months.  Discussion of decreased libido, medication effects,  and increased time with partner.   An  After Visit Summary was printed and given to the patient.  __15____ minutes face to face time  of which over 50% was spent in counseling.

## 2013-11-25 ENCOUNTER — Other Ambulatory Visit: Payer: Self-pay | Admitting: Family Medicine

## 2013-11-26 ENCOUNTER — Other Ambulatory Visit: Payer: Self-pay | Admitting: Family Medicine

## 2013-11-27 ENCOUNTER — Telehealth: Payer: Self-pay | Admitting: Obstetrics and Gynecology

## 2013-11-27 NOTE — Telephone Encounter (Signed)
Spoke with patient. Advised of importance of follow up. Patient requesting appointment for 10/7 in the late afternoon.Appointment rescheduled for 10/7 at 3:30pm with Dr.Silva. Patient agreeable to date and time.  Routing to provider for final review. Patient agreeable to disposition. Will close encounter

## 2013-11-27 NOTE — Telephone Encounter (Addendum)
Patient calling to cancel her recheck with Dr. Quincy Simmonds for 11/28/13. Her brother in law recently passed away and she says it is not a good time for her to come in. She wants to speak with the nurse to see whether she really needs to reschedule the appointment.

## 2013-11-27 NOTE — Telephone Encounter (Signed)
Left message to call Lavine Hargrove at [phone]. 

## 2013-11-28 ENCOUNTER — Ambulatory Visit: Payer: 59 | Admitting: Obstetrics and Gynecology

## 2013-11-29 ENCOUNTER — Ambulatory Visit: Payer: 59 | Admitting: Obstetrics and Gynecology

## 2013-12-05 ENCOUNTER — Ambulatory Visit (INDEPENDENT_AMBULATORY_CARE_PROVIDER_SITE_OTHER): Payer: 59 | Admitting: Obstetrics and Gynecology

## 2013-12-05 ENCOUNTER — Encounter: Payer: Self-pay | Admitting: Obstetrics and Gynecology

## 2013-12-05 VITALS — BP 120/84 | HR 70 | Ht 63.0 in | Wt 280.0 lb

## 2013-12-05 DIAGNOSIS — L988 Other specified disorders of the skin and subcutaneous tissue: Secondary | ICD-10-CM

## 2013-12-05 DIAGNOSIS — N649 Disorder of breast, unspecified: Secondary | ICD-10-CM

## 2013-12-05 NOTE — Progress Notes (Signed)
Patient ID: Alejandra Miller, female   DOB: 01/05/1961, 53 y.o.   MRN: [id number] GYNECOLOGY  VISIT   HPI: 53 y.o.   Married  Caucasian  female   G0P0 with Patient's last menstrual period was 07/17/2012.   here for   Follow up breast exam. States the ulcers of the breast skin have healed.  Using Bactroban still.  I was causing a lot of the problem (picking the eschars off).  Is now ready to proceed with mammogram.   Taking Provera 10 mg po x 10 days every 3 months for cycle regulation and hyperplasia prevention.   GYNECOLOGIC HISTORY: Patient's last menstrual period was 07/17/2012. Contraception:  vasectomy  Menopausal hormone therapy: n/a        OB History   Grav Para Term Preterm Abortions TAB SAB Ect Mult Living   0                  Patient Active Problem List   Diagnosis Date Noted  . Bursitis of shoulder 11/05/2013  . Skin ulcer 10/12/2013  . Cervical cancer screening 07/20/2012  . Obesity 02/12/2012  . Leg swelling 12/08/2011  . Annual physical exam 07/28/2011  . Menstrual irregularity 12/15/2010  . Vitamin D deficiency 12/15/2010  . OSA (obstructive sleep apnea) 08/11/2010  . Arthralgia 07/28/2010  . Hyperlipidemia 07/28/2010  . Hypertension 07/28/2010  . Depression 07/28/2010    Past Medical History  Diagnosis Date  . Depression     counseling in 2010 for 2 months on medication  . GERD (gastroesophageal reflux disease) 2005    treated with omeprazole  . Seasonal allergies     affected in the spring  . Hypertension 2002  . High cholesterol 2002  . History of sinusitis     once/twice per year  . Fibrocystic breast 1988  . Cervical cancer screening 07/20/2012    Menarche at 13, regular til recently No h/o abnormal paps, last pap 2-3 years ago G0P0  MGM today, no h/o abnormal MGMs Spotting now monthly and scant H/o uterine polyps  . Uterine polyp   . Anxiety     NO MEDS  . OSA (obstructive sleep apnea)     +sleep study 08/2010., DOES NOT USE CPAP, USES MOSES  MOUTH PIECE  . Osteoarthritis     KNEES, HANDS, HIPS  . Sleep apnea     mouthpiece "[facility]"  . MRSA (methicillin resistant staph aureus) culture positive 2007    Past Surgical History  Procedure Laterality Date  . Tonsillectomy  1969  . Wrist fracture surgery Right 07/2012  . Wisdom tooth extraction  1979  . Dilatation & currettage/hysteroscopy with resectocope N/A 11/24/2012    Procedure: DILATATION & CURETTAGE/HYSTEROSCOPY WITH RESECTOCOPE;  Surgeon: Arloa Koh, MD;  Location: [facility];  Service: Gynecology;  Laterality: N/A;    Current Outpatient Prescriptions  Medication Sig Dispense Refill  . amLODipine (NORVASC) 5 MG tablet take 1 tablet by mouth once daily  30 tablet  3  . atorvastatin (LIPITOR) 20 MG tablet take 1 tablet by mouth once daily  30 tablet  3  . bisoprolol-hydrochlorothiazide (ZIAC) 5-6.25 MG per tablet take 2 tablets by mouth once daily  60 tablet  2  . buPROPion (WELLBUTRIN SR) 150 MG 12 hr tablet take 1 tablet by mouth twice a day  60 tablet  4  . Cholecalciferol (VITAMIN D3) 1000 UNITS CAPS Take 1 capsule by mouth daily.        . citalopram (CELEXA) 20 MG  tablet take 1 tablet by mouth once daily  30 tablet  0  . cyclobenzaprine (FLEXERIL) 10 MG tablet take 1 tablet by mouth daily if needed  30 tablet  3  . Krill Oil 500 MG CAPS Take 500 mg by mouth daily.      . medroxyPROGESTERone (PROVERA) 10 MG tablet Take 1 tablet (10 mg total) by mouth daily. Take for 10 days in a row every 3 months.  10 tablet  1  . Multiple Vitamin (MULTIVITAMIN) tablet Take 1 tablet by mouth daily.        . mupirocin cream (BACTROBAN) 2 % Apply 1 application topically 3 (three) times daily.  30 g  0  . NAPROXEN PO Take by mouth as needed.      Marland Kitchen VITAMIN A PO Take 10,000 Units by mouth daily.       Marland Kitchen omeprazole (PRILOSEC) 20 MG capsule Take 1 capsule (20 mg total) by mouth daily.  30 capsule  5   No current facility-administered medications for this visit.     ALLERGIES: Review of  patient's allergies indicates no known allergies.  Family History  Problem Relation Age of Onset  . Osteoarthritis Mother   . Hyperlipidemia Mother   . Depression Mother   . Liver cancer Maternal Grandmother     mets to breast  . Breast cancer Maternal Grandmother     liver mets to breast  . Cancer Maternal Grandmother     with mets  . Hyperlipidemia Father   . Coronary artery disease Father   . Hypertension Father   . Stroke Father     multiple  . Diabetes Father   . Colon cancer Neg Hx     History   Social History  . Marital Status: Married    Spouse Name: N/A    Number of Children: N/A  . Years of Education: N/A   Occupational History  . Not on file.   Social History Main Topics  . Smoking status: Former Smoker -- 0.50 packs/day for 6 years    Types: Cigarettes    Quit date: 03/01/1989  . Smokeless tobacco: Never Used     Comment: Quit in 1991- started in 1983 1/2ppd  . Alcohol Use: No  . Drug Use: No  . Sexual Activity: Yes    Partners: Male    Birth Control/ Protection: None, Other-see comments     Comment: vasectomy   Other Topics Concern  . Not on file   Social History Narrative  . No narrative on file    ROS:  Pertinent items are noted in HPI.  PHYSICAL EXAMINATION:    BP 120/84  Pulse 70  Ht 5\' 3"  (1.6 m)  Wt 280 lb (127.007 kg)  BMI 49.61 kg/m2  LMP 07/17/2012     General appearance: alert, cooperative and appears stated age Right breast - four areas of skin ulceration now epithelialized and one with 4 mm eschar.   ASSESSMENT  Skin ulceration of the breast essentially healed. Negative MRSA evaluation but does have a history of this.  Due for screening mammogram.   PLAN  OK to continue with Bactroban bid until has mammogram. Wait two more weeks before proceeding with screening mammogram. Patient will schedule.  Continue Provera 10 mg po q day for 10 days per month.  Follow up in January 2016 to reassess menses and withdrawal  bleeds.    An After Visit Summary was printed and given to the patient.  ___15___ minutes face  to face time of which over 50% was spent in counseling.

## 2013-12-05 NOTE — Patient Instructions (Addendum)
Please continue your Mupirocin cream until after your mammogram is done.   I will see you in January 2016 for a recheck appointment.

## 2013-12-22 ENCOUNTER — Other Ambulatory Visit: Payer: Self-pay | Admitting: Family Medicine

## 2013-12-25 ENCOUNTER — Other Ambulatory Visit: Payer: Self-pay | Admitting: Family Medicine

## 2013-12-25 DIAGNOSIS — Z1231 Encounter for screening mammogram for malignant neoplasm of breast: Secondary | ICD-10-CM

## 2013-12-31 ENCOUNTER — Encounter: Payer: Self-pay | Admitting: Family Medicine

## 2013-12-31 ENCOUNTER — Ambulatory Visit (HOSPITAL_BASED_OUTPATIENT_CLINIC_OR_DEPARTMENT_OTHER)
Admission: RE | Admit: 2013-12-31 | Discharge: 2013-12-31 | Disposition: A | Discharge status: Home / Self Care | Payer: 59 | Source: Ambulatory Visit | Attending: Family Medicine | Admitting: Family Medicine

## 2013-12-31 DIAGNOSIS — Z1231 Encounter for screening mammogram for malignant neoplasm of breast: Secondary | ICD-10-CM | POA: Diagnosis present

## 2014-01-01 ENCOUNTER — Ambulatory Visit (INDEPENDENT_AMBULATORY_CARE_PROVIDER_SITE_OTHER): Payer: 59

## 2014-01-01 DIAGNOSIS — Z23 Encounter for immunization: Secondary | ICD-10-CM

## 2014-01-11 ENCOUNTER — Other Ambulatory Visit: Payer: Self-pay | Admitting: Family Medicine

## 2014-01-11 ENCOUNTER — Telehealth: Payer: Self-pay | Admitting: Family Medicine

## 2014-01-11 DIAGNOSIS — M2550 Pain in unspecified joint: Secondary | ICD-10-CM

## 2014-01-11 MED ORDER — CYCLOBENZAPRINE HCL 10 MG PO TABS: ORAL_TABLET | ORAL | Status: DC

## 2014-01-11 NOTE — Telephone Encounter (Signed)
Caller name: Aidan, [place] Relation to pt: self  Call back number: [phone]   Reason for call:   Pt requesting a refill of cyclobenzaprine (FLEXERIL) 10 MG tablet.  Pt took your advice and was seen by the chiropractor and still expericing pain in muscle between neck and shoulder on the ride side.

## 2014-01-11 NOTE — Telephone Encounter (Signed)
Informed patient of this.  °

## 2014-01-11 NOTE — Telephone Encounter (Signed)
I sent the Flexeril to herlast pharmacy on record please let her know

## 2014-02-11 ENCOUNTER — Encounter: Payer: Self-pay | Admitting: Family Medicine

## 2014-02-12 ENCOUNTER — Other Ambulatory Visit: Payer: Self-pay | Admitting: Family Medicine

## 2014-02-12 MED ORDER — OMEPRAZOLE 40 MG PO CPDR: 40.0000 mg | DELAYED_RELEASE_CAPSULE | Freq: Every day | ORAL | Status: DC

## 2014-03-01 DIAGNOSIS — M722 Plantar fascial fibromatosis: Secondary | ICD-10-CM

## 2014-03-01 HISTORY — DX: Plantar fascial fibromatosis: M72.2

## 2014-03-08 ENCOUNTER — Ambulatory Visit: Payer: 59 | Admitting: Obstetrics and Gynecology

## 2014-03-13 ENCOUNTER — Ambulatory Visit: Payer: 59 | Admitting: Obstetrics and Gynecology

## 2014-03-13 ENCOUNTER — Telehealth: Payer: Self-pay | Admitting: Obstetrics and Gynecology

## 2014-03-13 NOTE — Telephone Encounter (Signed)
Gay Filler,   Please assist in rescheduling my patient.   Thank you,   [place]

## 2014-03-13 NOTE — Telephone Encounter (Signed)
Pt called at 2pm to cancel her 245 64mo follow up. Pt apologized for not calling sooner, but she had to put her dog to sleep today. Pt was emotional on the phone. Pt would like to reschedule. Please advise any openings in your schedule that would be available for her.   Thank you

## 2014-03-14 NOTE — Telephone Encounter (Signed)
Return call to patient. Sympathies offered on loss of her dog. States she is doing better. Advised if she is feeling up to it, we have had cancellation today and she could come in if desires. Patient declines. Follow-up appointment rescheduled to 04-05-14 at 3pm. Patient to call me if needs anything before that time.   Encounter closed.

## 2014-04-02 ENCOUNTER — Other Ambulatory Visit: Payer: Self-pay | Admitting: Family Medicine

## 2014-04-03 NOTE — Telephone Encounter (Signed)
Rx request Denied; patient needs appointment/SLS

## 2014-04-05 ENCOUNTER — Other Ambulatory Visit: Payer: Self-pay | Admitting: Obstetrics and Gynecology

## 2014-04-05 ENCOUNTER — Ambulatory Visit (INDEPENDENT_AMBULATORY_CARE_PROVIDER_SITE_OTHER): Payer: 59 | Admitting: Obstetrics and Gynecology

## 2014-04-05 ENCOUNTER — Encounter: Payer: Self-pay | Admitting: Obstetrics and Gynecology

## 2014-04-05 VITALS — BP 138/94 | HR 80 | Ht 63.0 in | Wt 279.2 lb

## 2014-04-05 DIAGNOSIS — N926 Irregular menstruation, unspecified: Secondary | ICD-10-CM

## 2014-04-05 DIAGNOSIS — N951 Menopausal and female climacteric states: Secondary | ICD-10-CM

## 2014-04-05 MED ORDER — NORETHINDRONE 0.35 MG PO TABS: 1.0000 | ORAL_TABLET | Freq: Every day | ORAL | Status: DC

## 2014-04-05 NOTE — Progress Notes (Signed)
Patient ID: Alejandra Miller, female   DOB: 19-Sep-1960, 54 y.o.   MRN: [id number] GYNECOLOGY  VISIT   HPI: 54 y.o.   Married  Caucasian  female   G0P0 with Patient's last menstrual period was 03/13/2014 (exact date).   here for 3 month follow up of cycles and response to Provera.  Status post hysteroscopic polypectomy with dilation and curettage 10/2012.   Final pathology benign endometrial polyps.  Negative for hyperplasia and malignancy.   Due to irregular cycles and skipping every 3 months at time, patient was placed on every 3 month Provera 10 mg po x 10 days to reduce risk of endometrial hyperplasia.  More recent menses are as follows:  LMP 03-30-14 thru present.  Moderate flow.  03-08-14 PMS symptoms until cycle began 03-30-14 03-06-14 and 03-07-14 spotting only. 02-12-14 through 02-20-14 bleeding.  Heavy cramping.  Provera taken 01/29/14 - 02/07/14. 12/2013 no cycle but PMS symptoms most of the month 12-11-13 through 12-14-13 light bleeding 11-10-13 through 11-16-13 bleeding  PMS symptoms consist of moodiness, emotional episodes, backache and cramps like cycle is going to begin but doesn't. Some hot flashes.  FSH 53.7 and estradiol 33.5 on 10/12/13.  GYNECOLOGIC HISTORY: Patient's last menstrual period was 03/13/2014 (exact date). Contraception:  vasectomy  Menopausal hormone therapy: none       Mammogram:  01/01/14 - normal  OB History    Gravida Para Term Preterm AB TAB SAB Ectopic Multiple Living   0                  Patient Active Problem List   Diagnosis Date Noted  . Bursitis of shoulder 11/05/2013  . Skin ulcer 10/12/2013  . Cervical cancer screening 07/20/2012  . Obesity 02/12/2012  . Leg swelling 12/08/2011  . Annual physical exam 07/28/2011  . Menstrual irregularity 12/15/2010  . Vitamin D deficiency 12/15/2010  . OSA (obstructive sleep apnea) 08/11/2010  . Arthralgia 07/28/2010  . Hyperlipidemia 07/28/2010  . Hypertension 07/28/2010  . Depression 07/28/2010     Past Medical History  Diagnosis Date  . Depression     counseling in 2010 for 2 months on medication  . GERD (gastroesophageal reflux disease) 2005    treated with omeprazole  . Seasonal allergies     affected in the spring  . Hypertension 2002  . High cholesterol 2002  . History of sinusitis     once/twice per year  . Fibrocystic breast 1988  . Cervical cancer screening 07/20/2012    Menarche at 13, regular til recently No h/o abnormal paps, last pap 2-3 years ago G0P0  MGM today, no h/o abnormal MGMs Spotting now monthly and scant H/o uterine polyps  . Uterine polyp   . Anxiety     NO MEDS  . OSA (obstructive sleep apnea)     +sleep study 08/2010., DOES NOT USE CPAP, USES MOSES MOUTH PIECE  . Osteoarthritis     KNEES, HANDS, HIPS  . Sleep apnea     mouthpiece "[facility]"  . MRSA (methicillin resistant staph aureus) culture positive 2007  . Plantar fasciitis of left foot 03/2014    Past Surgical History  Procedure Laterality Date  . Tonsillectomy  1969  . Wrist fracture surgery Right 07/2012  . Wisdom tooth extraction  1979  . Dilatation & currettage/hysteroscopy with resectocope N/A 11/24/2012    Procedure: DILATATION & CURETTAGE/HYSTEROSCOPY WITH RESECTOCOPE;  Surgeon: Arloa Koh, MD;  Location: [facility];  Service: Gynecology;  Laterality: N/A;    Current  Outpatient Prescriptions  Medication Sig Dispense Refill  . amLODipine (NORVASC) 5 MG tablet take 1 tablet by mouth once daily 30 tablet 4  . atorvastatin (LIPITOR) 20 MG tablet take 1 tablet by mouth once daily 30 tablet 4  . bisoprolol-hydrochlorothiazide (ZIAC) 5-6.25 MG per tablet take 2 tablets by mouth once daily 60 tablet 2  . buPROPion (WELLBUTRIN SR) 150 MG 12 hr tablet take 1 tablet by mouth twice a day 60 tablet 4  . Cholecalciferol (VITAMIN D3) 1000 UNITS CAPS Take 1 capsule by mouth daily.      . citalopram (CELEXA) 20 MG tablet TAKE ONE TABLET BY MOUTH ONCE DAILY 30 tablet 4  . cyclobenzaprine (FLEXERIL)  10 MG tablet take 1 tablet by mouth daily if needed 30 tablet 2  . medroxyPROGESTERone (PROVERA) 10 MG tablet Take 1 tablet (10 mg total) by mouth daily. Take for 10 days in a row every 3 months. 10 tablet 1  . Multiple Vitamin (MULTIVITAMIN) tablet Take 1 tablet by mouth daily.      . mupirocin cream (BACTROBAN) 2 % Apply 1 application topically 3 (three) times daily. 30 g 0  . NAPROXEN PO Take by mouth as needed.    . Omega-3 Fatty Acids (FISH OIL) 1200 MG CAPS Take 1 capsule by mouth daily.    Marland Kitchen omeprazole (PRILOSEC) 40 MG capsule Take 1 capsule (40 mg total) by mouth daily. 30 capsule 3  . VITAMIN A PO Take 10,000 Units by mouth daily.      No current facility-administered medications for this visit.     ALLERGIES: Review of patient's allergies indicates no known allergies.  Family History  Problem Relation Age of Onset  . Osteoarthritis Mother   . Hyperlipidemia Mother   . Depression Mother   . Liver cancer Maternal Grandmother     mets to breast  . Breast cancer Maternal Grandmother     liver mets to breast  . Cancer Maternal Grandmother     with mets  . Hyperlipidemia Father   . Coronary artery disease Father   . Hypertension Father   . Stroke Father     multiple  . Diabetes Father   . Colon cancer Neg Hx     History   Social History  . Marital Status: Married    Spouse Name: N/A    Number of Children: N/A  . Years of Education: N/A   Occupational History  . Not on file.   Social History Main Topics  . Smoking status: Former Smoker -- 0.50 packs/day for 6 years    Types: Cigarettes    Quit date: 03/01/1989  . Smokeless tobacco: Never Used     Comment: Quit in 1991- started in 1983 1/2ppd  . Alcohol Use: No  . Drug Use: No  . Sexual Activity:    Partners: Male    Birth Control/ Protection: None, Other-see comments     Comment: vasectomy   Other Topics Concern  . Not on file   Social History Narrative    ROS:  Pertinent items are noted in  HPI.  PHYSICAL EXAMINATION:    BP 138/94 mmHg  Pulse 80  Ht 5\' 3"  (1.6 m)  Wt 279 lb 3.2 oz (126.644 kg)  BMI 49.47 kg/m2  LMP 03/13/2014 (Exact Date)     General appearance: alert, cooperative and appears stated age  ASSESSMENT  Irregular menses.  Menopausal symptoms.  Perimenopausal female.  History of endometrial polyps.  PLAN  Will recheck [place].   Plan to start Micronor.  Discussion of medication and usage. If Dunn elevated, will need to pursue sonohysterogram and EMB. Return prn.    An After Visit Summary was printed and given to the patient.  _15_____ minutes face to face time of which over 50% was spent in counseling.

## 2014-04-05 NOTE — Patient Instructions (Signed)
Norethindrone tablets (contraception) What is this medicine? NORETHINDRONE (nor eth IN drone) is an oral contraceptive. The product contains a female hormone known as a progestin. It is used to prevent pregnancy. This medicine may be used for other purposes; ask your health care provider or pharmacist if you have questions. COMMON BRAND NAME(S): Camila, Deblitane 28-Day, Errin, Heather, [place], [place], [place], Nor-QD, Nora-BE, Norlyroc, Ortho Micronor, [employer] 28-Day What should I tell my health care provider before I take this medicine? They need to know if you have any of these conditions: -blood vessel disease or blood clots -breast, cervical, or vaginal cancer -diabetes -heart disease -kidney disease -liver disease -mental depression -migraine -seizures -stroke -vaginal bleeding -an unusual or allergic reaction to norethindrone, other medicines, foods, dyes, or preservatives -pregnant or trying to get pregnant -breast-feeding How should I use this medicine? Take this medicine by mouth with a glass of water. You may take it with or without food. Follow the directions on the prescription label. Take this medicine at the same time each day and in the order directed on the package. Do not take your medicine more often than directed. Contact your pediatrician regarding the use of this medicine in children. Special care may be needed. This medicine has been used in female children who have started having menstrual periods. A patient package insert for the product will be given with each prescription and refill. Read this sheet carefully each time. The sheet may change frequently. Overdosage: If you think you have taken too much of this medicine contact a poison control center or emergency room at once. NOTE: This medicine is only for you. Do not share this medicine with others. What if I miss a dose? Try not to miss a dose. Every time you miss a dose or take a dose late your chance of  pregnancy increases. When 1 pill is missed (even if only 3 hours late), take the missed pill as soon as possible and continue taking a pill each day at the regular time (use a back up method of birth control for the next 48 hours). If more than 1 dose is missed, use an additional birth control method for the rest of your pill pack until menses occurs. Contact your health care professional if more than 1 dose has been missed. What may interact with this medicine? Do not take this medicine with any of the following medications: -amprenavir or fosamprenavir -bosentan This medicine may also interact with the following medications: -antibiotics or medicines for infections, especially rifampin, rifabutin, rifapentine, and griseofulvin, and possibly penicillins or tetracyclines -aprepitant -barbiturate medicines, such as phenobarbital -carbamazepine -felbamate -modafinil -oxcarbazepine -phenytoin -ritonavir or other medicines for HIV infection or AIDS -St. John's wort -topiramate This list may not describe all possible interactions. Give your health care provider a list of all the medicines, herbs, non-prescription drugs, or dietary supplements you use. Also tell them if you smoke, drink alcohol, or use illegal drugs. Some items may interact with your medicine. What should I watch for while using this medicine? Visit your doctor or health care professional for regular checks on your progress. You will need a regular breast and pelvic exam and Pap smear while on this medicine. Use an additional method of birth control during the first cycle that you take these tablets. If you have any reason to think you are pregnant, stop taking this medicine right away and contact your doctor or health care professional. If you are taking this medicine for hormone related problems, it  may take several cycles of use to see improvement in your condition. This medicine does not protect you against HIV infection (AIDS)  or any other sexually transmitted diseases. What side effects may I notice from receiving this medicine? Side effects that you should report to your doctor or health care professional as soon as possible: -breast tenderness or discharge -pain in the abdomen, chest, groin or leg -severe headache -skin rash, itching, or hives -sudden shortness of breath -unusually weak or tired -vision or speech problems -yellowing of skin or eyes Side effects that usually do not require medical attention (report to your doctor or health care professional if they continue or are bothersome): -changes in sexual desire -change in menstrual flow -facial hair growth -fluid retention and swelling -headache -irritability -nausea -weight gain or loss This list may not describe all possible side effects. Call your doctor for medical advice about side effects. You may report side effects to FDA at 1-800-FDA-1088. Where should I keep my medicine? Keep out of the reach of children. Store at room temperature between 15 and 30 degrees C (59 and 86 degrees F). Throw away any unused medicine after the expiration date. NOTE: This sheet is a summary. It may not cover all possible information. If you have questions about this medicine, talk to your doctor, pharmacist, or health care provider.  2015, Elsevier/Gold Standard. (2011-11-05 16:41:35)

## 2014-04-06 LAB — FOLLICLE STIMULATING HORMONE: FSH: 50.3 m[IU]/mL

## 2014-04-10 ENCOUNTER — Telehealth: Payer: Self-pay | Admitting: Obstetrics and Gynecology

## 2014-04-10 DIAGNOSIS — N950 Postmenopausal bleeding: Secondary | ICD-10-CM

## 2014-04-10 LAB — ESTRADIOL: Estradiol: 21.9 pg/mL

## 2014-04-10 NOTE — Telephone Encounter (Signed)
Pt returning phone call to Dr Quincy Simmonds.

## 2014-04-10 NOTE — Addendum Note (Signed)
Addended by: Tacy Learn, Maysa Lynn E on: 04/10/2014 09:32 AM   Modules accepted: Orders

## 2014-04-10 NOTE — Telephone Encounter (Signed)
Phone call to discuss test results: [phone].   Left message to return my call.   No details left.   I am calling regarding the patient's lab result showing [place] 50.3.  I am adding an estradiol level, but I am anticipating further evaluation with sonohystereogram and potential endometrial biopsy.

## 2014-04-10 NOTE — Addendum Note (Signed)
Addended by: Graylon Good on: 04/10/2014 12:22 PM   Modules accepted: Orders

## 2014-04-10 NOTE — Telephone Encounter (Signed)
Phone call to discuss results.  Patient has already started on Micronor as in the office it appeared that she was having menstruation.   Will add estradiol level and AMH if necessary.   If testing indicates menopause, will need to proceed with normal work up which can include pelvic ultrasound, sonohysterogram, and endometrial biopsy.

## 2014-04-10 NOTE — Telephone Encounter (Signed)
Phone call to patient to discuss results.  No answer.  Left message to return my call to my cell phone as I am on call tonight for the practice. No details left   Patient then immediately returned my call.  Estradiol is 21.9.  FSH 50.3. These labs are consistent with menopause! Patient is not having bleeding from menstruation.   I am recommending the patient have evaluation for postmenopausal bleeding including a sonohysterogram and endometrial biopsy.  I will place an order for precert.  Stop Micronor which was just initiated a few days ago.

## 2014-04-11 ENCOUNTER — Telehealth: Payer: Self-pay | Admitting: *Deleted

## 2014-04-11 NOTE — Telephone Encounter (Signed)
Call to patient to schedule [facility] and endo biopsy as ordered by Dr Quincy Simmonds. First available is 04-25-14 but advised patient we will likely have cancellations and will move her up. Patient states she has work obligations and unless we can give her a late afternoon appointment, she cannot really come any earlier than 04-25-14. Advised Dr Quincy Simmonds recommends proceeding ASAP. Offered to schedule Endo biopsy first here in office and ultrasound at hospital. Can defer sonohystogram until results of these. Patient declines this option. Would rather wait and have everything done at once on 04-25-14. Appointment 04-26-14 at [facility] to take Motrin 800 mg one hour prior with food. Patient has her own RX for 0.5 mg Lorazepam that she would like to take prior to procedure. Advised she needs to come early to sign consent before taking medication and needs to have someone to drive her home. Patient agreeable.   Routing to provider for final review. Patient agreeable to disposition. Will close encounter

## 2014-04-12 ENCOUNTER — Telehealth: Payer: Self-pay | Admitting: Obstetrics and Gynecology

## 2014-04-12 NOTE — Telephone Encounter (Signed)
Call to patient to offer u/s slot at 4pm on Feb 18.  No answer.  Left message for patient to call either Gay Filler or myself back.

## 2014-04-12 NOTE — Telephone Encounter (Signed)
Routing to provider for final review. Patient agreeable to disposition. Will close encounter.     

## 2014-04-12 NOTE — Telephone Encounter (Signed)
Patient is returning a call to Sabrina °

## 2014-04-12 NOTE — Telephone Encounter (Signed)
Called patient back. Patient states that she does not want to move to the 18th.

## 2014-04-23 ENCOUNTER — Telehealth: Payer: Self-pay | Admitting: Obstetrics and Gynecology

## 2014-04-23 NOTE — Telephone Encounter (Signed)
Please have patient keep her appointment as scheduled for this week.   Thank you!

## 2014-04-23 NOTE — Telephone Encounter (Signed)
Spoke with patient. Patient states that she has started bleeding some and had concerns about keeping her appointment on 2/25. Patient is currently scheduled for SHGM and EMB. States that "I am only noticing it when I wipe. It is not like a flow but is noticeable when I wipe after using the bathroom. It may stop by then but I do not know." Advised patient will need to speak with Dr.Silva because with EMB we need good visualization of the endometrium. Advised will speak with her about her recommendations for appointment and return call. Patient is agreeable.

## 2014-04-23 NOTE — Telephone Encounter (Signed)
Patient is having a procedure done on Thursday and has some questions

## 2014-04-23 NOTE — Telephone Encounter (Signed)
Spoke with patient. Advised of message as seen below from Mallory. Patient is agreeable and verbalizes understanding. Will keep appointment scheduled for 2/25.  Routing to provider for final review. Patient agreeable to disposition. Will close encounter

## 2014-04-25 ENCOUNTER — Other Ambulatory Visit: Payer: Self-pay | Admitting: Obstetrics and Gynecology

## 2014-04-25 ENCOUNTER — Ambulatory Visit (INDEPENDENT_AMBULATORY_CARE_PROVIDER_SITE_OTHER): Payer: 59 | Admitting: Obstetrics and Gynecology

## 2014-04-25 ENCOUNTER — Ambulatory Visit (INDEPENDENT_AMBULATORY_CARE_PROVIDER_SITE_OTHER): Payer: 59

## 2014-04-25 ENCOUNTER — Encounter: Payer: Self-pay | Admitting: Obstetrics and Gynecology

## 2014-04-25 VITALS — BP 124/78 | HR 56 | Ht 63.0 in | Wt 278.0 lb

## 2014-04-25 DIAGNOSIS — D259 Leiomyoma of uterus, unspecified: Secondary | ICD-10-CM

## 2014-04-25 DIAGNOSIS — N950 Postmenopausal bleeding: Secondary | ICD-10-CM

## 2014-04-25 DIAGNOSIS — N9489 Other specified conditions associated with female genital organs and menstrual cycle: Secondary | ICD-10-CM

## 2014-04-25 NOTE — Progress Notes (Signed)
Subjective  Patient here for evaluation of postmenopausal bleeding.  FSH 50.3 and estradiol 21.9 on 04/05/14.  History of benign endometrial polyps removed by hysteroscopy.  Started bleeding two days ago.   Objective  Pelvic ultrasound images and report reviewed with patient and technician in real time.   Ultrasound -  Uterus with 4 fibroids, all about 1 cm each.  EMS 4.03 with echogenic area at fundus suspicious for polyp.  Ovaries with right ovarian follicles under 15 mm and left ovary with collapsed CL 11 mm.  No free fluid.     Procedure - sonohysterogram Consent performed. Speculum placed in vagina. Sterile prep of cervix with betadine. Cannula placed inside endometrial cavity without difficult. Speculum removed. Sterile saline injected.   2           filling defect noted.  13 mm on left and 7 mm on right cornual regions. Cannula removed. No complication.   Procedure - endometrial biopsy Consent performed. Speculum place in vagina.  Sterile prep of cervix with betadine.  Pipelle placed to    6      cm without difficulty twice. Tissue obtained and sent to pathology. Speculum removed.  No complications.  Assessment   Postmenopausal by labs.  No absence of menses for one year. On periodic Provera. Signs of estrogenic activity on ultrasound. Endometrial thickening, suspicious for polyps. Fibroids.   Plan   Discussion of fibroids, etiology, benign nature and rare sarcoma formation, symptoms of bleeding.  Discussion of thickened endometrium and possible recurrent polyp formation.  Follow up EMB results. Will have patient return for discussion and planning for treatment.   10 minutes face to face time of which over 50% was spent in counseling in addition to performing the sonohysterogram and EMB today.  After visit summary to patient.

## 2014-04-25 NOTE — Patient Instructions (Signed)
Endometrial Biopsy, Care After Refer to this sheet in the next few weeks. These instructions provide you with information on caring for yourself after your procedure. Your health care provider may also give you more specific instructions. Your treatment has been planned according to current medical practices, but problems sometimes occur. Call your health care provider if you have any problems or questions after your procedure. WHAT TO EXPECT AFTER THE PROCEDURE After your procedure, it is typical to have the following:  You may have mild cramping and a small amount of vaginal bleeding for a few days after the procedure. This is normal. HOME CARE INSTRUCTIONS  Only take over-the-counter or prescription medicine as directed by your health care provider.  Do not douche, use tampons, or have sexual intercourse until your health care provider approves.  Follow your health care provider's instructions regarding any activity restrictions, such as strenuous exercise or heavy lifting. SEEK MEDICAL CARE IF:  You have heavy bleeding or bleeding longer than 2 days after the procedure.  You have bad smelling drainage from your vagina.  You have a fever and chills.  Youhave severe lower stomach (abdominal) pain. SEEK IMMEDIATE MEDICAL CARE IF:  You have severe cramps in your stomach or back.  You pass large blood clots.  Your bleeding increases.  You become weak or lightheaded, or you pass out. Document Released: 12/06/2012 Document Reviewed: 12/06/2012 ExitCare Patient Information 2015 [employer]. This information is not intended to replace advice given to you by your health care provider. Make sure you discuss any questions you have with your health care provider.  

## 2014-04-28 ENCOUNTER — Other Ambulatory Visit: Payer: Self-pay | Admitting: Family Medicine

## 2014-04-29 LAB — IPS OTHER TISSUE BIOPSY

## 2014-04-30 ENCOUNTER — Telehealth: Payer: Self-pay | Admitting: Emergency Medicine

## 2014-04-30 NOTE — Telephone Encounter (Signed)
-----   Message from [place], MD sent at 04/30/2014  6:46 AM EST ----- Please report benign endometrial biopsy results to patient.  Estrogenic effect was noted. There was no sign of cancer, hyperplasia, or polyp noted. I would like her to keep her appointment to discuss results and plan.  Cc - Marisa Sprinkles

## 2014-04-30 NOTE — Telephone Encounter (Signed)
Patient given message from Dr. Quincy Simmonds. She will follow up as scheduled. Routing to provider for final review. Patient agreeable to disposition. Will close encounter

## 2014-04-30 NOTE — Telephone Encounter (Signed)
Pt returned call

## 2014-04-30 NOTE — Telephone Encounter (Signed)
Message left to return call to [place] at [phone].   Advised message not urgent.

## 2014-05-03 ENCOUNTER — Ambulatory Visit: Payer: 59 | Admitting: Family Medicine

## 2014-05-06 ENCOUNTER — Ambulatory Visit (INDEPENDENT_AMBULATORY_CARE_PROVIDER_SITE_OTHER): Payer: 59 | Admitting: Obstetrics and Gynecology

## 2014-05-06 ENCOUNTER — Encounter: Payer: Self-pay | Admitting: Obstetrics and Gynecology

## 2014-05-06 VITALS — BP 120/74 | HR 70 | Ht 63.0 in | Wt 277.4 lb

## 2014-05-06 DIAGNOSIS — N9489 Other specified conditions associated with female genital organs and menstrual cycle: Secondary | ICD-10-CM

## 2014-05-06 DIAGNOSIS — N921 Excessive and frequent menstruation with irregular cycle: Secondary | ICD-10-CM

## 2014-05-06 NOTE — Patient Instructions (Signed)
Please read your handouts about potential hysterectomy.  Remember that we also discussed repeat hysteroscopy with dilation and curettage following by potential further more regular progesterone therapy.  We can meet to discuss anything further if you would like.

## 2014-05-06 NOTE — Progress Notes (Signed)
Patient ID: Alejandra Miller, female   DOB: 21-Feb-1961, 54 y.o.   MRN: [id number] GYNECOLOGY  VISIT   HPI: 54 y.o.   Married  Caucasian  female   G0P0 with Patient's last menstrual period was 04/22/2014 (exact date).   here to discuss results of endometrial biopsy and further plan.  Vaginal bleeding pattern has been the following: Overall is spotting every month.  Bleeds for consecutive days. Bleeds for a couple of days and then bleeds again two weeks later.  Does not feel that she is really having a period when this occurs.   Has been taking Provera periodically 10 mg for 10 days every 3 months.  When she takes this, she has a real period and cramping.   FSH 50.3 and estradiol 21.9 on 04/05/14.   Pelvic ultrasound 04/25/14:  Uterus with 4 fibroids, all about 1 cm each.  EMS 4.03 with echogenic area at fundus suspicious for polyp.  Ovaries with right ovarian follicles under 15 mm and left ovary with collapsed CL 11 mm.  No free fluid.  Sonohysterogram 2 filling defects noted in cornual regions.  Was not having bleeding at the time of the ultrasound/sonohysterogram.  EMB 04/25/14 - proliferative endometrium. No polyp or hyperplasia or cancer.   History of benign endometrial polyps removed by hysteroscopy on 11/24/12.  Had a Mirena IUD in the past and it had malposition of the IUD and so it was removed.  It had partially expelled and it was in the cervix.   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   GYNECOLOGIC HISTORY: Patient's last menstrual period was 04/22/2014 (exact date). Contraception:  Vasectomy, left Essure Menopausal hormone therapy: none        OB History    Gravida Para Term Preterm AB TAB SAB Ectopic Multiple Living   0                  Patient Active Problem List   Diagnosis Date Noted  . Bursitis of shoulder 11/05/2013  . Skin ulcer 10/12/2013  . Cervical cancer screening 07/20/2012  . Obesity 02/12/2012  . Leg swelling  12/08/2011  . Annual physical exam 07/28/2011  . Menstrual irregularity 12/15/2010  . Vitamin D deficiency 12/15/2010  . OSA (obstructive sleep apnea) 08/11/2010  . Arthralgia 07/28/2010  . Hyperlipidemia 07/28/2010  . Hypertension 07/28/2010  . Depression 07/28/2010    Past Medical History  Diagnosis Date  . Depression     counseling in 2010 for 2 months on medication  . GERD (gastroesophageal reflux disease) 2005    treated with omeprazole  . Seasonal allergies     affected in the spring  . Hypertension 2002  . High cholesterol 2002  . History of sinusitis     once/twice per year  . Fibrocystic breast 1988  . Cervical cancer screening 07/20/2012    Menarche at 13, regular til recently No h/o abnormal paps, last pap 2-3 years ago G0P0  MGM today, no h/o abnormal MGMs Spotting now monthly and scant H/o uterine polyps  . Uterine polyp   . Anxiety     NO MEDS  . OSA (obstructive sleep apnea)     +sleep study 08/2010., DOES NOT USE CPAP, USES MOSES MOUTH PIECE  . Osteoarthritis     KNEES, HANDS, HIPS  . Sleep apnea     mouthpiece "[facility]"  . MRSA (methicillin resistant staph aureus) culture positive 2007  . Plantar fasciitis of left foot 03/2014  . Fibroids  2014    Past Surgical History  Procedure Laterality Date  . Tonsillectomy  1969  . Wrist fracture surgery Right 07/2012  . Wisdom tooth extraction  1979  . Dilatation & currettage/hysteroscopy with resectocope N/A 11/24/2012    Procedure: DILATATION & CURETTAGE/HYSTEROSCOPY WITH RESECTOCOPE;  Surgeon: Arloa Koh, MD;  Location: [facility];  Service: Gynecology;  Laterality: N/A;  . Essure tubal ligation Left     Current Outpatient Prescriptions  Medication Sig Dispense Refill  . amLODipine (NORVASC) 5 MG tablet take 1 tablet by mouth once daily 30 tablet 4  . atorvastatin (LIPITOR) 20 MG tablet take 1 tablet by mouth once daily 30 tablet 4  . bisoprolol-hydrochlorothiazide (ZIAC) 5-6.25 MG per tablet take 2 tablets by  mouth once daily 60 tablet 1  . buPROPion (WELLBUTRIN SR) 150 MG 12 hr tablet take 1 tablet by mouth twice a day 60 tablet 4  . Cholecalciferol (VITAMIN D3) 1000 UNITS CAPS Take 1 capsule by mouth daily.      . citalopram (CELEXA) 20 MG tablet TAKE ONE TABLET BY MOUTH ONCE DAILY 30 tablet 4  . cyclobenzaprine (FLEXERIL) 10 MG tablet take 1 tablet by mouth daily if needed 30 tablet 2  . fluticasone (FLONASE) 50 MCG/ACT nasal spray   0  . Multiple Vitamin (MULTIVITAMIN) tablet Take 1 tablet by mouth daily.      . mupirocin cream (BACTROBAN) 2 % Apply 1 application topically 3 (three) times daily. 30 g 0  . NAPROXEN PO Take by mouth as needed.    . Omega-3 Fatty Acids (FISH OIL) 1200 MG CAPS Take 1 capsule by mouth daily.    Marland Kitchen omeprazole (PRILOSEC) 40 MG capsule Take 1 capsule (40 mg total) by mouth daily. 30 capsule 3  . VITAMIN A PO Take 10,000 Units by mouth daily.      No current facility-administered medications for this visit.     ALLERGIES: Review of patient's allergies indicates no known allergies.  Family History  Problem Relation Age of Onset  . Osteoarthritis Mother   . Hyperlipidemia Mother   . Depression Mother   . Liver cancer Maternal Grandmother     mets to breast  . Breast cancer Maternal Grandmother     liver mets to breast  . Cancer Maternal Grandmother     with mets  . Hyperlipidemia Father   . Coronary artery disease Father   . Hypertension Father   . Stroke Father     multiple  . Diabetes Father   . Colon cancer Neg Hx     History   Social History  . Marital Status: Married    Spouse Name: N/A  . Number of Children: N/A  . Years of Education: N/A   Occupational History  . Not on file.   Social History Main Topics  . Smoking status: Former Smoker -- 0.50 packs/day for 6 years    Types: Cigarettes    Quit date: 03/01/1989  . Smokeless tobacco: Never Used     Comment: Quit in 1991- started in 1983 1/2ppd  . Alcohol Use: No  . Drug Use: No  .  Sexual Activity:    Partners: Male    Birth Control/ Protection: None, Other-see comments     Comment: vasectomy   Other Topics Concern  . Not on file   Social History Narrative    ROS:  Pertinent items are noted in HPI.  PHYSICAL EXAMINATION:    BP 120/74 mmHg  Pulse 70  Ht  5\' 3"  (1.6 m)  Wt 277 lb 6.4 oz (125.828 kg)  BMI 49.15 kg/m2  LMP 04/22/2014 (Exact Date)     General appearance: alert, cooperative and appears stated age   ASSESSMENT  Metrorrhagia.  Labs consistent with menopause.  Ovaries show some estrogenic activity.  Status post left Essure.  Endometrial masses consistent with probable polyps.  Negative EMB. Fibroids Risk for hyperplasia due to body habitus.   PLAN  I discussed hysteroscopy with dilation and curettage followed by progesterone hormonal treatment with regular cyclic progesterone, Micronor, or Mirena IUD.  I also discussed hysterectomy with robotic approach for treatment of abnormal uterine bleeding.  I discussed that hysterectomy is a major surgery that has benefits and risks.  Risks include but are not limited to bleeding, infection, damage to surrounding organs, reaction to anesthesia, pneumonia, DVT, PE, death, need for reoperation, and menopausal symptoms. I discussed surgical expectations and recovery.  Patient given information about hysterectomy and robotic hysterectomy as she has already had a hysteroscopy with polypectomy and dilation and curettage. She will consider options and call back with her choice.   An After Visit Summary was printed and given to the patient.  ___25___ minutes face to face time of which over 50% was spent in counseling.

## 2014-05-07 ENCOUNTER — Encounter: Payer: Self-pay | Admitting: Obstetrics and Gynecology

## 2014-05-14 ENCOUNTER — Ambulatory Visit (INDEPENDENT_AMBULATORY_CARE_PROVIDER_SITE_OTHER): Payer: 59 | Admitting: Family Medicine

## 2014-05-14 ENCOUNTER — Encounter: Payer: Self-pay | Admitting: Family Medicine

## 2014-05-14 VITALS — HR 86 | Temp 98.7°F | Ht 63.0 in | Wt 273.2 lb

## 2014-05-14 DIAGNOSIS — I10 Essential (primary) hypertension: Secondary | ICD-10-CM

## 2014-05-14 DIAGNOSIS — E669 Obesity, unspecified: Secondary | ICD-10-CM

## 2014-05-14 DIAGNOSIS — F329 Major depressive disorder, single episode, unspecified: Secondary | ICD-10-CM

## 2014-05-14 DIAGNOSIS — E782 Mixed hyperlipidemia: Secondary | ICD-10-CM

## 2014-05-14 DIAGNOSIS — F32A Depression, unspecified: Secondary | ICD-10-CM

## 2014-05-14 DIAGNOSIS — E785 Hyperlipidemia, unspecified: Secondary | ICD-10-CM

## 2014-05-14 NOTE — Progress Notes (Signed)
Pre visit review using our clinic review tool, if applicable. No additional management support is needed unless otherwise documented below in the visit note. 

## 2014-05-14 NOTE — Patient Instructions (Signed)
[facility] weight loss med  DASH Eating Plan DASH stands for "Dietary Approaches to Stop Hypertension." The DASH eating plan is a healthy eating plan that has been shown to reduce high blood pressure (hypertension). Additional health benefits may include reducing the risk of type 2 diabetes mellitus, heart disease, and stroke. The DASH eating plan may also help with weight loss. WHAT DO I NEED TO KNOW ABOUT THE DASH EATING PLAN? For the DASH eating plan, you will follow these general guidelines:  Choose foods with a percent daily value for sodium of less than 5% (as listed on the food label).  Use salt-free seasonings or herbs instead of table salt or sea salt.  Check with your health care provider or pharmacist before using salt substitutes.  Eat lower-sodium products, often labeled as "lower sodium" or "no salt added."  Eat fresh foods.  Eat more vegetables, fruits, and low-fat dairy products.  Choose whole grains. Look for the word "whole" as the first word in the ingredient list.  Choose fish and skinless chicken or [place] more often than red meat. Limit fish, poultry, and meat to 6 oz (170 g) each day.  Limit sweets, desserts, sugars, and sugary drinks.  Choose heart-healthy fats.  Limit cheese to 1 oz (28 g) per day.  Eat more home-cooked food and less restaurant, buffet, and fast food.  Limit fried foods.  Cook foods using methods other than frying.  Limit canned vegetables. If you do use them, rinse them well to decrease the sodium.  When eating at a restaurant, ask that your food be prepared with less salt, or no salt if possible. WHAT FOODS CAN I EAT? Seek help from a dietitian for individual calorie needs. Grains Whole grain or whole wheat bread. Brown rice. Whole grain or whole wheat pasta. Quinoa, bulgur, and whole grain cereals. Low-sodium cereals. Corn or whole wheat flour tortillas. Whole grain cornbread. Whole grain crackers. Low-sodium  crackers. Vegetables Fresh or frozen vegetables (raw, steamed, roasted, or grilled). Low-sodium or reduced-sodium tomato and vegetable juices. Low-sodium or reduced-sodium tomato sauce and paste. Low-sodium or reduced-sodium canned vegetables.  Fruits All fresh, canned (in natural juice), or frozen fruits. Meat and Other Protein Products Ground beef (85% or leaner), grass-fed beef, or beef trimmed of fat. Skinless chicken or [place]. Ground chicken or [place]. Pork trimmed of fat. All fish and seafood. Eggs. Dried beans, peas, or lentils. Unsalted nuts and seeds. Unsalted canned beans. Dairy Low-fat dairy products, such as skim or 1% milk, 2% or reduced-fat cheeses, low-fat ricotta or cottage cheese, or plain low-fat yogurt. Low-sodium or reduced-sodium cheeses. Fats and Oils Tub margarines without trans fats. Light or reduced-fat mayonnaise and salad dressings (reduced sodium). Avocado. Safflower, olive, or canola oils. Natural peanut or almond butter. Other Unsalted popcorn and pretzels. The items listed above may not be a complete list of recommended foods or beverages. Contact your dietitian for more options. WHAT FOODS ARE NOT RECOMMENDED? Grains White bread. White pasta. White rice. Refined cornbread. Bagels and croissants. Crackers that contain trans fat. Vegetables Creamed or fried vegetables. Vegetables in a cheese sauce. Regular canned vegetables. Regular canned tomato sauce and paste. Regular tomato and vegetable juices. Fruits Dried fruits. Canned fruit in light or heavy syrup. Fruit juice. Meat and Other Protein Products Fatty cuts of meat. Ribs, chicken wings, bacon, sausage, bologna, salami, chitterlings, fatback, hot dogs, bratwurst, and packaged luncheon meats. Salted nuts and seeds. Canned beans with salt. Dairy Whole or 2% milk, cream, half-and-half, and cream cheese.  Whole-fat or sweetened yogurt. Full-fat cheeses or blue cheese. Nondairy creamers and whipped toppings.  Processed cheese, cheese spreads, or cheese curds. Condiments Onion and garlic salt, seasoned salt, table salt, and sea salt. Canned and packaged gravies. Worcestershire sauce. Tartar sauce. Barbecue sauce. Teriyaki sauce. Soy sauce, including reduced sodium. Steak sauce. Fish sauce. Oyster sauce. Cocktail sauce. Horseradish. Ketchup and mustard. Meat flavorings and tenderizers. Bouillon cubes. Hot sauce. Tabasco sauce. Marinades. Taco seasonings. Relishes. Fats and Oils Butter, stick margarine, lard, shortening, ghee, and bacon fat. Coconut, palm kernel, or palm oils. Regular salad dressings. Other Pickles and olives. Salted popcorn and pretzels. The items listed above may not be a complete list of foods and beverages to avoid. Contact your dietitian for more information. WHERE CAN I FIND MORE INFORMATION? National Heart, Lung, and Blood Institute: travelstabloid.com Document Released: 02/04/2011 Document Revised: 07/02/2013 Document Reviewed: 12/20/2012 [facility] Patient Information 2015 [place], [place]. This information is not intended to replace advice given to you by your health care provider. Make sure you discuss any questions you have with your health care provider.

## 2014-05-19 ENCOUNTER — Encounter: Payer: Self-pay | Admitting: Family Medicine

## 2014-05-19 NOTE — Assessment & Plan Note (Signed)
Tolerating statin, encouraged heart healthy diet, avoid trans fats, minimize simple carbs and saturated fats. Increase exercise as tolerated 

## 2014-05-19 NOTE — Assessment & Plan Note (Signed)
Encouraged DASH diet, decrease po intake and increase exercise as tolerated. Needs 7-8 hours of sleep nightly. Avoid trans fats, eat small, frequent meals every 4-5 hours with lean proteins, complex carbs and healthy fats. Minimize simple carbs 

## 2014-05-19 NOTE — Assessment & Plan Note (Signed)
Doing well on Celexa no changes 

## 2014-05-19 NOTE — Progress Notes (Signed)
Alejandra Miller  [id number] Oct 14, 1960 05/19/2014      Progress Note-Follow Up  Subjective  Chief Complaint  Chief Complaint  Patient presents with  . Follow-up    HPI  Patient is a 54 y.o. female in today for routine medical care. Patient is in today for follow-up. Generally feeling well. Has been struggling with little bit of plantar fasciitis but it is improved at today's visit. Denies any recent injury. Is struggling with some postmenopausal bleeding and is working with gynecology to figure out if she will benefit from a hysterectomy or D&C. Denies CP/palp/SOB/HA/congestion/fevers/GI or GU c/o. Taking meds as prescribed  Past Medical History  Diagnosis Date  . Depression     counseling in 2010 for 2 months on medication  . GERD (gastroesophageal reflux disease) 2005    treated with omeprazole  . Seasonal allergies     affected in the spring  . Hypertension 2002  . High cholesterol 2002  . History of sinusitis     once/twice per year  . Fibrocystic breast 1988  . Cervical cancer screening 07/20/2012    Menarche at 13, regular til recently No h/o abnormal paps, last pap 2-3 years ago G0P0  MGM today, no h/o abnormal MGMs Spotting now monthly and scant H/o uterine polyps  . Uterine polyp   . Anxiety     NO MEDS  . OSA (obstructive sleep apnea)     +sleep study 08/2010., DOES NOT USE CPAP, USES MOSES MOUTH PIECE  . Osteoarthritis     KNEES, HANDS, HIPS  . Sleep apnea     mouthpiece "[facility]"  . MRSA (methicillin resistant staph aureus) culture positive 2007  . Plantar fasciitis of left foot 03/2014  . Fibroids 2014    Past Surgical History  Procedure Laterality Date  . Tonsillectomy  1969  . Wrist fracture surgery Right 07/2012  . Wisdom tooth extraction  1979  . Dilatation & currettage/hysteroscopy with resectocope N/A 11/24/2012    Procedure: DILATATION & CURETTAGE/HYSTEROSCOPY WITH RESECTOCOPE;  Surgeon: Arloa Koh, MD;  Location: [facility];  Service: Gynecology;   Laterality: N/A;  . Essure tubal ligation Left     Family History  Problem Relation Age of Onset  . Osteoarthritis Mother   . Hyperlipidemia Mother   . Depression Mother   . Liver cancer Maternal Grandmother     mets to breast  . Breast cancer Maternal Grandmother     liver mets to breast  . Cancer Maternal Grandmother     with mets  . Hyperlipidemia Father   . Coronary artery disease Father   . Hypertension Father   . Stroke Father     multiple  . Diabetes Father   . Colon cancer Neg Hx     History   Social History  . Marital Status: Married    Spouse Name: N/A  . Number of Children: N/A  . Years of Education: N/A   Occupational History  . Not on file.   Social History Main Topics  . Smoking status: Former Smoker -- 0.50 packs/day for 6 years    Types: Cigarettes    Quit date: 03/01/1989  . Smokeless tobacco: Never Used     Comment: Quit in 1991- started in 1983 1/2ppd  . Alcohol Use: No  . Drug Use: No  . Sexual Activity:    Partners: Male    Birth Control/ Protection: None, Other-see comments     Comment: vasectomy   Other Topics Concern  .  Not on file   Social History Narrative    Current Outpatient Prescriptions on File Prior to Visit  Medication Sig Dispense Refill  . amLODipine (NORVASC) 5 MG tablet take 1 tablet by mouth once daily 30 tablet 4  . atorvastatin (LIPITOR) 20 MG tablet take 1 tablet by mouth once daily 30 tablet 4  . bisoprolol-hydrochlorothiazide (ZIAC) 5-6.25 MG per tablet take 2 tablets by mouth once daily 60 tablet 1  . buPROPion (WELLBUTRIN SR) 150 MG 12 hr tablet take 1 tablet by mouth twice a day 60 tablet 4  . Cholecalciferol (VITAMIN D3) 1000 UNITS CAPS Take 1 capsule by mouth daily.      . citalopram (CELEXA) 20 MG tablet TAKE ONE TABLET BY MOUTH ONCE DAILY 30 tablet 4  . cyclobenzaprine (FLEXERIL) 10 MG tablet take 1 tablet by mouth daily if needed 30 tablet 2  . fluticasone (FLONASE) 50 MCG/ACT nasal spray   0  .  Multiple Vitamin (MULTIVITAMIN) tablet Take 1 tablet by mouth daily.      . mupirocin cream (BACTROBAN) 2 % Apply 1 application topically 3 (three) times daily. 30 g 0  . NAPROXEN PO Take by mouth as needed.    . Omega-3 Fatty Acids (FISH OIL) 1200 MG CAPS Take 1 capsule by mouth daily.    Marland Kitchen omeprazole (PRILOSEC) 40 MG capsule Take 1 capsule (40 mg total) by mouth daily. 30 capsule 3  . VITAMIN A PO Take 10,000 Units by mouth daily.      No current facility-administered medications on file prior to visit.    No Known Allergies  Review of Systems  Review of Systems  Constitutional: Negative for fever and malaise/fatigue.  HENT: Negative for congestion.   Eyes: Negative for discharge.  Respiratory: Negative for shortness of breath.   Cardiovascular: Negative for chest pain, palpitations and leg swelling.  Gastrointestinal: Negative for nausea, abdominal pain and diarrhea.  Genitourinary: Negative for dysuria.  Musculoskeletal: Negative for falls.  Skin: Negative for rash.  Neurological: Negative for loss of consciousness and headaches.  Endo/Heme/Allergies: Negative for polydipsia.  Psychiatric/Behavioral: Negative for depression and suicidal ideas. The patient is not nervous/anxious and does not have insomnia.     Objective  Pulse 86  Temp(Src) 98.7 F (37.1 C) (Oral)  Ht 5\' 3"  (1.6 m)  Wt 273 lb 4 oz (123.945 kg)  BMI 48.42 kg/m2  SpO2 99%  LMP 04/22/2014 (Exact Date)  Physical Exam  Physical Exam  Constitutional: She is oriented to person, place, and time and well-developed, well-nourished, and in no distress. No distress.  HENT:  Head: Normocephalic and atraumatic.  Eyes: Conjunctivae are normal.  Neck: Neck supple. No thyromegaly present.  Cardiovascular: Normal rate, regular rhythm and normal heart sounds.   No murmur heard. Pulmonary/Chest: Effort normal and breath sounds normal. She has no wheezes.  Abdominal: She exhibits no distension and no mass.    Musculoskeletal: She exhibits no edema.  Lymphadenopathy:    She has no cervical adenopathy.  Neurological: She is alert and oriented to person, place, and time.  Skin: Skin is warm and dry. No rash noted. She is not diaphoretic.  Psychiatric: Memory, affect and judgment normal.    Lab Results  Component Value Date   TSH 0.847 10/29/2013   Lab Results  Component Value Date   WBC 6.5 10/29/2013   HGB 13.0 10/29/2013   HCT 39.8 10/29/2013   MCV 84.0 10/29/2013   PLT 361 10/29/2013   Lab Results  Component Value  Date   CREATININE 0.91 10/29/2013   BUN 17 10/29/2013   NA 141 10/29/2013   K 4.4 10/29/2013   CL 104 10/29/2013   CO2 27 10/29/2013   Lab Results  Component Value Date   ALT 26 10/29/2013   AST 18 10/29/2013   ALKPHOS 80 10/29/2013   BILITOT 0.5 10/29/2013   Lab Results  Component Value Date   CHOL 149 10/29/2013   Lab Results  Component Value Date   HDL 40 10/29/2013   Lab Results  Component Value Date   LDLCALC 82 10/29/2013   Lab Results  Component Value Date   TRIG 134 10/29/2013   Lab Results  Component Value Date   CHOLHDL 3.7 10/29/2013     Assessment & Plan  Hypertension Well controlled, no changes to meds. Encouraged heart healthy diet such as the DASH diet and exercise as tolerated.    Hyperlipidemia Tolerating statin, encouraged heart healthy diet, avoid trans fats, minimize simple carbs and saturated fats. Increase exercise as tolerated   Obesity Encouraged DASH diet, decrease po intake and increase exercise as tolerated. Needs 7-8 hours of sleep nightly. Avoid trans fats, eat small, frequent meals every 4-5 hours with lean proteins, complex carbs and healthy fats. Minimize simple carbs   Depression Doing well on Celexa no changes.

## 2014-05-19 NOTE — Assessment & Plan Note (Signed)
Well controlled, no changes to meds. Encouraged heart healthy diet such as the DASH diet and exercise as tolerated.  °

## 2014-05-20 ENCOUNTER — Other Ambulatory Visit (INDEPENDENT_AMBULATORY_CARE_PROVIDER_SITE_OTHER): Payer: 59

## 2014-05-20 DIAGNOSIS — E782 Mixed hyperlipidemia: Secondary | ICD-10-CM

## 2014-05-20 DIAGNOSIS — I10 Essential (primary) hypertension: Secondary | ICD-10-CM

## 2014-05-20 LAB — COMPREHENSIVE METABOLIC PANEL
ALK PHOS: 78 U/L (ref 39–117)
ALT: 26 U/L (ref 0–35)
AST: 20 U/L (ref 0–37)
Albumin: 3.7 g/dL (ref 3.5–5.2)
BILIRUBIN TOTAL: 0.5 mg/dL (ref 0.2–1.2)
BUN: 14 mg/dL (ref 6–23)
CO2: 32 mEq/L (ref 19–32)
Calcium: 9.1 mg/dL (ref 8.4–10.5)
Chloride: 101 mEq/L (ref 96–112)
Creatinine, Ser: 0.89 mg/dL (ref 0.40–1.20)
GFR: 70.46 mL/min (ref >60.00)
Glucose, Bld: 115 mg/dL — ABNORMAL HIGH (ref 70–99)
Potassium: 3.6 mEq/L (ref 3.5–5.1)
SODIUM: 139 meq/L (ref 135–145)
Total Protein: 6.4 g/dL (ref 6.0–8.3)

## 2014-05-20 LAB — LIPID PANEL
Cholesterol: 200 mg/dL (ref 0–200)
HDL: 37.2 mg/dL — AB (ref >39.00)
LDL CALC: 129 mg/dL — AB (ref 0–99)
NONHDL: 162.8
Total CHOL/HDL Ratio: 5
Triglycerides: 167 mg/dL — ABNORMAL HIGH (ref 0.0–149.0)
VLDL: 33.4 mg/dL (ref 0.0–40.0)

## 2014-05-20 LAB — CBC
HCT: 41.1 % (ref 36.0–46.0)
Hemoglobin: 14.1 g/dL (ref 12.0–15.0)
MCHC: 34.2 g/dL (ref 30.0–36.0)
MCV: 82.1 fl (ref 78.0–100.0)
PLATELETS: 363 10*3/uL (ref 150.0–400.0)
RBC: 5.01 Mil/uL (ref 3.87–5.11)
RDW: 14.3 % (ref 11.5–15.5)
WBC: 6.5 10*3/uL (ref 4.0–10.5)

## 2014-05-20 LAB — TSH: TSH: 0.89 u[IU]/mL (ref 0.35–4.50)

## 2014-05-28 ENCOUNTER — Encounter: Payer: Self-pay | Admitting: Family Medicine

## 2014-05-30 ENCOUNTER — Other Ambulatory Visit: Payer: Self-pay | Admitting: Family Medicine

## 2014-05-30 MED ORDER — SULFAMETHOXAZOLE-TRIMETHOPRIM 800-160 MG PO TABS
1.0000 | ORAL_TABLET | Freq: Two times a day (BID) | ORAL | Status: AC
Start: 2014-05-30 — End: 2014-06-13

## 2014-06-19 ENCOUNTER — Telehealth: Payer: Self-pay | Admitting: Obstetrics and Gynecology

## 2014-06-19 NOTE — Telephone Encounter (Signed)
Pt states she and dr Quincy Simmonds discussed a couple of options for surgery. Pt has made her decision and would like to begin scheduling process.

## 2014-06-20 ENCOUNTER — Encounter: Payer: Self-pay | Admitting: Obstetrics and Gynecology

## 2014-06-20 NOTE — Telephone Encounter (Signed)
The procedure you have listed is correct: Hysteroscopy, dilation and curettage, resection of endometrial mass with Myosure.  We will need to use the block time available in the OR as this is what we are now assigned to use.

## 2014-06-20 NOTE — Telephone Encounter (Signed)
Please confirm posting of case. Hysteroscopy/Resection of endometrial mass/Myosure/D&C.

## 2014-06-20 NOTE — Telephone Encounter (Signed)
Patient calling to let Dr Quincy Simmonds know she has decided to proceed with Hysteroscopy and interested in scheduling in late May.  Requested Thursday or Friday or week of 07-25-14 and states Dr Quincy Simmonds has worked this out for her in the past to have surgery later in the week. Advised that these dates are not available at hospital during times that will coordinate with office hours.  Discussed other available date options and scheduling policies reviewed. Will send to billing office for precert and patient will let me know what date she decides on.

## 2014-06-21 NOTE — Telephone Encounter (Signed)
Case request sent to Central Scheduling. Requested PAT appointment due to history of sleep apnea, hypertension and previous positive MRSA culture. Patient for surgery consult with Dr Quincy Simmonds on 07-12-14.  Routing to provider for final review. Patient agreeable to disposition. Will close encounter

## 2014-06-21 NOTE — Telephone Encounter (Signed)
See My Chart message. Reviewed with Dr Quincy Simmonds. Returned call to patient. Advised that Dr Quincy Simmonds does not recommend uterine ablation for her based on her lab values that indicate menopause. Also discussed increased difficulty assessing endometrial lining after this procedure to assess for endometrial hyperplasia. Overall, the potential benefits do not outweigh potential risks but Dr Quincy Simmonds will explain this in greater detail at surgery consult. Patient is ready to proceed with scheduling and requests 07-30-14. Surgical instruction sheet reviewed and printed copy mailed to patient, see scanned copy. Patient reports she has a history of MRSA and had to receive prophylactic antibiotics prior to last procedure. Advised hospital will be notified of this history. Dr Quincy Simmonds will discuss at surgery consult.

## 2014-06-21 NOTE — Telephone Encounter (Signed)
Left message for patient to call back  

## 2014-06-21 NOTE — Telephone Encounter (Signed)
Pt returning call

## 2014-06-21 NOTE — Telephone Encounter (Signed)
Spoke with patient. Advised of benefit quote received for surgeons portion of surgery. Patient agreeable. Provided patient with telephone # and cpt code so that she can contact [facility] to get estimated costs for facility fees. Patient will contact [facility] regarding scheduling once she speaks with pre-service center.

## 2014-06-21 NOTE — Telephone Encounter (Signed)
Left message for patient to call back. Need to go over surgery benefits. °

## 2014-07-07 ENCOUNTER — Other Ambulatory Visit: Payer: Self-pay | Admitting: Family Medicine

## 2014-07-12 ENCOUNTER — Ambulatory Visit: Payer: 59 | Admitting: Obstetrics and Gynecology

## 2014-07-12 ENCOUNTER — Telehealth: Payer: Self-pay | Admitting: Obstetrics and Gynecology

## 2014-07-12 NOTE — Telephone Encounter (Signed)
Provider cancel due to illness. Left voicemail to reschedule. Pt appt surgical consult. Please call to schedule.

## 2014-07-12 NOTE — Telephone Encounter (Signed)
Left message to call back to reschedule appointment. See notes below about possible slots.

## 2014-07-12 NOTE — Telephone Encounter (Signed)
Please offer 330 on 07-18-14 or 115 on 07-19-14.

## 2014-07-23 ENCOUNTER — Other Ambulatory Visit: Payer: Self-pay | Admitting: Family Medicine

## 2014-07-25 NOTE — Anesthesia Preprocedure Evaluation (Addendum)
Anesthesia Evaluation  Patient identified by MRN, date of birth, ID band Patient awake    Reviewed: Allergy & Precautions, NPO status , Patient's Chart, lab work & pertinent test results, reviewed documented beta blocker date and time   Airway Mallampati: III   Neck ROM: Full    Dental  (+) Dental Advisory Given, Teeth Intact   Pulmonary sleep apnea , former smoker (quit 1991),  breath sounds clear to auscultation        Cardiovascular hypertension, Pt. on medications Rhythm:Regular  EKG wnl 04/2014   Neuro/Psych Anxiety Depression    GI/Hepatic Neg liver ROS, GERD-  Medicated,  Endo/Other  Morbid obesity  Renal/GU negative Renal ROS     Musculoskeletal  (+) Arthritis -,   Abdominal (+) + obese,   Peds  Hematology   Anesthesia Other Findings   Reproductive/Obstetrics                           Anesthesia Physical Anesthesia Plan  ASA: III  Anesthesia Plan: General   Post-op Pain Management:    Induction: Intravenous  Airway Management Planned: Oral ETT  Additional Equipment:   Intra-op Plan:   Post-operative Plan: Extubation in OR  Informed Consent: I have reviewed the patients History and Physical, chart, labs and discussed the procedure including the risks, benefits and alternatives for the proposed anesthesia with the patient or authorized representative who has indicated his/her understanding and acceptance.     Plan Discussed with:   Anesthesia Plan Comments: (Glide available, multimodal pain rx)        Anesthesia Quick Evaluation

## 2014-07-26 ENCOUNTER — Other Ambulatory Visit: Payer: Self-pay

## 2014-07-26 ENCOUNTER — Encounter (HOSPITAL_COMMUNITY)
Admission: RE | Admit: 2014-07-26 | Discharge: 2014-07-26 | Disposition: A | Discharge status: Home / Self Care | Payer: 59 | Source: Ambulatory Visit | Attending: Obstetrics and Gynecology | Admitting: Obstetrics and Gynecology

## 2014-07-26 ENCOUNTER — Encounter: Payer: Self-pay | Admitting: Obstetrics and Gynecology

## 2014-07-26 ENCOUNTER — Ambulatory Visit (INDEPENDENT_AMBULATORY_CARE_PROVIDER_SITE_OTHER): Payer: 59 | Admitting: Obstetrics and Gynecology

## 2014-07-26 VITALS — BP 110/76 | HR 72 | Resp 16 | Ht 63.0 in

## 2014-07-26 DIAGNOSIS — Z01818 Encounter for other preprocedural examination: Secondary | ICD-10-CM | POA: Insufficient documentation

## 2014-07-26 DIAGNOSIS — N9489 Other specified conditions associated with female genital organs and menstrual cycle: Secondary | ICD-10-CM

## 2014-07-26 DIAGNOSIS — R1909 Other intra-abdominal and pelvic swelling, mass and lump: Secondary | ICD-10-CM | POA: Diagnosis not present

## 2014-07-26 DIAGNOSIS — N950 Postmenopausal bleeding: Secondary | ICD-10-CM

## 2014-07-26 LAB — BASIC METABOLIC PANEL
Anion gap: 7 (ref 5–15)
BUN: 12 mg/dL (ref 6–20)
CALCIUM: 9 mg/dL (ref 8.9–10.3)
CO2: 28 mmol/L (ref 22–32)
Chloride: 102 mmol/L (ref 101–111)
Creatinine, Ser: 0.95 mg/dL (ref 0.44–1.00)
GFR calc Af Amer: >60 mL/min (ref >60)
GLUCOSE: 105 mg/dL — AB (ref 65–99)
Potassium: 3.4 mmol/L — ABNORMAL LOW (ref 3.5–5.1)
SODIUM: 137 mmol/L (ref 135–145)

## 2014-07-26 LAB — CBC
HCT: 41.8 % (ref 36.0–46.0)
HEMOGLOBIN: 13.8 g/dL (ref 12.0–15.0)
MCH: 28.1 pg (ref 26.0–34.0)
MCHC: 33 g/dL (ref 30.0–36.0)
MCV: 85.1 fL (ref 78.0–100.0)
PLATELETS: 357 10*3/uL (ref 150–400)
RBC: 4.91 MIL/uL (ref 3.87–5.11)
RDW: 14 % (ref 11.5–15.5)
WBC: 7.7 10*3/uL (ref 4.0–10.5)

## 2014-07-26 NOTE — Patient Instructions (Addendum)
   Your procedure is scheduled on: MAY 31 AT 1130AM  Enter through the Main Entrance of [facility] at: [place] up the phone at the desk and dial [phone] and inform us of your arrival.  Please call this number if you have any problems the morning of surgery: [phone]  Remember: Do not eat food after midnight: MAY 30 Do not drink clear liquids after: MAY 31 AT 730AM Take these medicines the morning of surgery with a SIP OF WATER: TAKE BLOOD PRESSURE MEDS AS NORMAL NIGHT BEFORE.Dennis Bast CAN TAKE WELLBUTRIN DAY OF SURGERY  Do not wear jewelry, make-up, or FINGER nail polish No metal in your hair or on your body. Do not wear lotions, powders, perfumes.  You may wear deodorant.  Do not bring valuables to the hospital. Contacts, dentures or bridgework may not be worn into surgery.  Leave suitcase in the car. After Surgery it may be brought to your room. For patients being admitted to the hospital, checkout time is 11:00am the day of discharge.    Patients discharged on the day of surgery will not be allowed to drive home.

## 2014-07-26 NOTE — Progress Notes (Signed)
GYNECOLOGY  VISIT   HPI: 54 y.o.   Married  Caucasian  female   G0P0 with Patient's last menstrual period was 07/11/2014 (within days).   here for discussion of surgery for postmenopausal bleeding and endometrial masses. Desires hysteroscopic removal of fibroids and not hysterectomy.   Bled on 07/11/14 for 2 days, not heavy.  Had cramping as well.   FSH 50.3 and estradiol 21.9 on 04/05/14.  History of benign endometrial endometrioid polyps removed by hysteroscopy on 11/24/12.  Ultrasound - 04/25/14 Uterus with 4 fibroids, all about 1 cm each.  EMS 4.03 with echogenic area at fundus suspicious for polyp.  Ovaries with right ovarian follicles under 15 mm and left ovary with collapsed CL? 11 mm.  No free fluid.  Sonohysterogram showed 78filling defects; 13 mm on left and 7 mm on right cornual regions.  EMB 04/25/14 - weakly proliferative endometrium, negative for hyperplasia or malignancy.   GYNECOLOGIC HISTORY: Patient's last menstrual period was 07/11/2014 (within days). Contraception: none Menopausal hormone therapy:  None.  Periodic Provera.  Last mammogram:  12/31/13 - BI-RADS 1. Last pap smear:  10/12/13 - negative, HR HPV negative.         OB History    Gravida Para Term Preterm AB TAB SAB Ectopic Multiple Living   0                  Patient Active Problem List   Diagnosis Date Noted  . Bursitis of shoulder 11/05/2013  . Skin ulcer 10/12/2013  . Cervical cancer screening 07/20/2012  . Obesity 02/12/2012  . Leg swelling 12/08/2011  . Annual physical exam 07/28/2011  . Menstrual irregularity 12/15/2010  . Vitamin D deficiency 12/15/2010  . OSA (obstructive sleep apnea) 08/11/2010  . Arthralgia 07/28/2010  . Hyperlipidemia 07/28/2010  . Hypertension 07/28/2010  . Depression 07/28/2010    Past Medical History  Diagnosis Date  . Depression     counseling in 2010 for 2 months on medication  . GERD (gastroesophageal reflux disease) 2005    treated with omeprazole   . Seasonal allergies     affected in the spring  . Hypertension 2002  . High cholesterol 2002  . History of sinusitis     once/twice per year  . Fibrocystic breast 1988  . Cervical cancer screening 07/20/2012    Menarche at 13, regular til recently No h/o abnormal paps, last pap 2-3 years ago G0P0  MGM today, no h/o abnormal MGMs Spotting now monthly and scant H/o uterine polyps  . Uterine polyp   . Anxiety     NO MEDS  . OSA (obstructive sleep apnea)     +sleep study 08/2010., DOES NOT USE CPAP, USES MOSES MOUTH PIECE  . Osteoarthritis     KNEES, HANDS, HIPS  . Sleep apnea     mouthpiece "[facility]"  . MRSA (methicillin resistant staph aureus) culture positive 2007  . Plantar fasciitis of left foot 03/2014  . Fibroids 2014    Past Surgical History  Procedure Laterality Date  . Tonsillectomy  1969  . Wrist fracture surgery Right 07/2012  . Wisdom tooth extraction  1979  . Dilatation & currettage/hysteroscopy with resectocope N/A 11/24/2012    Procedure: DILATATION & CURETTAGE/HYSTEROSCOPY WITH RESECTOCOPE;  Surgeon: Arloa Koh, MD;  Location: [facility];  Service: Gynecology;  Laterality: N/A;  . Essure tubal ligation Left     Current Outpatient Prescriptions  Medication Sig Dispense Refill  . amLODipine (NORVASC) 5 MG tablet take 1 tablet by  mouth once daily 30 tablet 4  . atorvastatin (LIPITOR) 20 MG tablet take 1 tablet by mouth once daily 30 tablet 4  . bisoprolol-hydrochlorothiazide (ZIAC) 5-6.25 MG per tablet take 2 tablets by mouth once daily 60 tablet 6  . buPROPion (WELLBUTRIN SR) 150 MG 12 hr tablet take 1 tablet by mouth twice a day 60 tablet 4  . Cholecalciferol (VITAMIN D3) 1000 UNITS CAPS Take 1 capsule by mouth daily.      . citalopram (CELEXA) 20 MG tablet take 1 tablet by mouth once daily 30 tablet 6  . cyclobenzaprine (FLEXERIL) 10 MG tablet take 1 tablet by mouth daily if needed (Patient taking differently: Take 10 mg by mouth daily as needed for muscle spasms. )  30 tablet 2  . fluticasone (FLONASE) 50 MCG/ACT nasal spray Place 1 spray into both nostrils at bedtime.   0  . Multiple Vitamin (MULTIVITAMIN) tablet Take 1 tablet by mouth daily.      . mupirocin cream (BACTROBAN) 2 % Apply 1 application topically 3 (three) times daily. (Patient taking differently: Apply 1 application topically 3 (three) times daily as needed. ) 30 g 0  . Omega-3 Fatty Acids (FISH OIL) 1000 MG CAPS Take 1,000 mg by mouth daily.    Marland Kitchen omeprazole (PRILOSEC) 40 MG capsule take 1 capsule by mouth once daily 30 capsule 6  . VITAMIN A PO Take 10,000 Units by mouth daily.     . vitamin B-12 (CYANOCOBALAMIN) 1000 MCG tablet Take 1,000 mcg by mouth daily.     No current facility-administered medications for this visit.     ALLERGIES: Review of patient's allergies indicates no known allergies.  Family History  Problem Relation Age of Onset  . Osteoarthritis Mother   . Hyperlipidemia Mother   . Depression Mother   . Liver cancer Maternal Grandmother     mets to breast  . Breast cancer Maternal Grandmother     liver mets to breast  . Cancer Maternal Grandmother     with mets  . Hyperlipidemia Father   . Coronary artery disease Father   . Hypertension Father   . Stroke Father     multiple  . Diabetes Father   . Colon cancer Neg Hx     History   Social History  . Marital Status: Married    Spouse Name: N/A  . Number of Children: N/A  . Years of Education: N/A   Occupational History  . Not on file.   Social History Main Topics  . Smoking status: Former Smoker -- 0.50 packs/day for 6 years    Types: Cigarettes    Quit date: 03/01/1989  . Smokeless tobacco: Never Used     Comment: Quit in 1991- started in 1983 1/2ppd  . Alcohol Use: No  . Drug Use: No  . Sexual Activity:    Partners: Male    Birth Control/ Protection: Other-see comments     Comment: vasectomy   Other Topics Concern  . Not on file   Social History Narrative    ROS:  Pertinent items are  noted in HPI.  PHYSICAL EXAMINATION:    BP 110/76 mmHg  Pulse 72  Resp 16  Ht 5\' 3"  (1.6 m)  LMP 07/11/2014 (Within Days)    General appearance: alert, cooperative and appears stated age Head: Normocephalic, without obvious abnormality, atraumatic Neck: no adenopathy, supple, symmetrical, trachea midline and thyroid normal to inspection and palpation Lungs: clear to auscultation bilaterally Heart: regular rate and rhythm Abdomen:  obese, soft, non-tender; bowel sounds normal; no masses,  no organomegaly Extremities: extremities normal, atraumatic, no cyanosis or edema Skin: Skin color, texture, turgor normal. No rashes or lesions Lymph nodes: Cervical, supraclavicular, and axillary nodes normal. No abnormal inguinal nodes palpated Neurologic: Grossly normal  Pelvic: External genitalia:  no lesions              Urethra:  normal appearing urethra with no masses, tenderness or lesions              Bartholins and Skenes: normal                 Vagina: normal appearing vagina with normal color and discharge, no lesions              Cervix: no lesions            Bimanual Exam:  Uterus:  normal size, contour, position, consistency, mobility, non-tender              Adnexa: normal adnexa and no mass, fullness, tenderness              Rectovaginal: Yes.  .  Confirms.              Anus:  normal sphincter tone, no lesions  Chaperone was present for exam.  ASSESSMENT  Postmenopausal bleeding.  Endometrial masses, suspicious for recurrent polyps. Has small intramural fibroids.    PLAN  Discussion of postmenopausal bleeding and endometrial masses. Discussion of hysteroscopy with Myosure polypectomy, dilation and curettage.   Risks, benefits, and alternatives reviewed. Risks include but are not limited to bleeding, infection, damage to surrounding organs including uterine perforation requiring hospitalization and laparoscopy, pulmonary edema, reaction to anesthesia, DVT, PE, death, need  for further treatment and surgery including repeat hysteroscopy, hysterectomy or medical therapy.    Surgical expectations and recovery discussed.  Patient wishes to proceed.  An After Visit Summary was printed and given to the patient.  ___15___ minutes face to face time of which over 50% was spent in counseling.

## 2014-07-28 NOTE — H&P (Signed)
Progress Notes         Nunzio Cobbs, MD at 07/26/2014  3:04 PM       Status: Signed        Expand All Collapse All   GYNECOLOGY VISIT   HPI: 54 y.o.   Married  Caucasian  female    G0P0 with Patient's last menstrual period was 07/11/2014 (within days).   here for discussion of surgery for postmenopausal bleeding and endometrial masses. Desires hysteroscopic removal of fibroids and not hysterectomy.   Bled on 07/11/14 for 2 days, not heavy.   Had cramping as well.     FSH 50.3 and estradiol 21.9 on 04/05/14.   History of benign endometrial endometrioid polyps removed by hysteroscopy on 11/24/12.  Ultrasound -  04/25/14 Uterus with 4 fibroids, all about 1 cm each.   EMS 4.03 with echogenic area at fundus suspicious for polyp.   Ovaries with right ovarian follicles under 15 mm and left ovary with collapsed CL? 11 mm.   No free fluid.   Sonohysterogram showed 2 filling defects;  13 mm on left and 7 mm on right cornual regions.  EMB 04/25/14 - weakly proliferative endometrium, negative for hyperplasia or malignancy.   GYNECOLOGIC HISTORY: Patient's last menstrual period was 07/11/2014 (within days). Contraception: none Menopausal hormone therapy:  None.  Periodic Provera.   Last mammogram:  12/31/13 - BI-RADS 1. Last pap smear:  10/12/13 - negative, HR HPV negative.           OB History      Gravida  Para  Term  Preterm  AB  TAB  SAB  Ectopic  Multiple  Living     0                               Patient Active Problem List     Diagnosis  Date Noted   .  Bursitis of shoulder  11/05/2013   .  Skin ulcer  10/12/2013   .  Cervical cancer screening  07/20/2012   .  Obesity  02/12/2012   .  Leg swelling  12/08/2011   .  Annual physical exam  07/28/2011   .  Menstrual irregularity  12/15/2010   .  Vitamin D deficiency  12/15/2010   .  OSA (obstructive sleep apnea)  08/11/2010   .  Arthralgia  07/28/2010   .  Hyperlipidemia  07/28/2010   .  Hypertension  07/28/2010    .  Depression  07/28/2010       Past Medical History   Diagnosis  Date   .  Depression         counseling in 2010 for 2 months on medication   .  GERD (gastroesophageal reflux disease)  2005       treated with omeprazole   .  Seasonal allergies         affected in the spring   .  Hypertension  2002   .  High cholesterol  2002   .  History of sinusitis         once/twice per year   .  Fibrocystic breast  1988   .  Cervical cancer screening  07/20/2012       Menarche at 13, regular til recently No h/o abnormal paps, last pap 2-3 years ago G0P0  MGM today, no h/o abnormal MGMs Spotting now monthly and scant H/o uterine  polyps   .  Uterine polyp     .  Anxiety         NO MEDS   .  OSA (obstructive sleep apnea)         +sleep study 08/2010., DOES NOT USE CPAP, USES MOSES MOUTH PIECE   .  Osteoarthritis         KNEES, HANDS, HIPS   .  Sleep apnea         mouthpiece "[facility]"   .  MRSA (methicillin resistant staph aureus) culture positive  2007   .  Plantar fasciitis of left foot  03/2014   .  Fibroids  2014       Past Surgical History   Procedure  Laterality  Date   .  Tonsillectomy    1969   .  Wrist fracture surgery  Right  07/2012   .  Wisdom tooth extraction    1979   .  Dilatation & currettage/hysteroscopy with resectocope  N/A  11/24/2012       Procedure: DILATATION & CURETTAGE/HYSTEROSCOPY WITH RESECTOCOPE;  Surgeon: Arloa Koh, MD;  Location: [facility];  Service: Gynecology;  Laterality: N/A;   .  Essure tubal ligation  Left         Current Outpatient Prescriptions   Medication  Sig  Dispense  Refill   .  amLODipine (NORVASC) 5 MG tablet  take 1 tablet by mouth once daily  30 tablet  4   .  atorvastatin (LIPITOR) 20 MG tablet  take 1 tablet by mouth once daily  30 tablet  4   .  bisoprolol-hydrochlorothiazide (ZIAC) 5-6.25 MG per tablet  take 2 tablets by mouth once daily  60 tablet  6   .  buPROPion (WELLBUTRIN SR) 150 MG 12 hr tablet  take 1 tablet by mouth twice a day   60 tablet  4   .  Cholecalciferol (VITAMIN D3) 1000 UNITS CAPS  Take 1 capsule by mouth daily.         .  citalopram (CELEXA) 20 MG tablet  take 1 tablet by mouth once daily  30 tablet  6   .  cyclobenzaprine (FLEXERIL) 10 MG tablet  take 1 tablet by mouth daily if needed (Patient taking differently: Take 10 mg by mouth daily as needed for muscle spasms. )  30 tablet  2   .  fluticasone (FLONASE) 50 MCG/ACT nasal spray  Place 1 spray into both nostrils at bedtime.     0   .  Multiple Vitamin (MULTIVITAMIN) tablet  Take 1 tablet by mouth daily.         .  mupirocin cream (BACTROBAN) 2 %  Apply 1 application topically 3 (three) times daily. (Patient taking differently: Apply 1 application topically 3 (three) times daily as needed. )  30 g  0   .  Omega-3 Fatty Acids (FISH OIL) 1000 MG CAPS  Take 1,000 mg by mouth daily.       Marland Kitchen  omeprazole (PRILOSEC) 40 MG capsule  take 1 capsule by mouth once daily  30 capsule  6   .  VITAMIN A PO  Take 10,000 Units by mouth daily.        .  vitamin B-12 (CYANOCOBALAMIN) 1000 MCG tablet  Take 1,000 mcg by mouth daily.          No current facility-administered medications for this visit.      ALLERGIES: Review of patient's allergies indicates no known  allergies.    Family History   Problem  Relation  Age of Onset   .  Osteoarthritis  Mother     .  Hyperlipidemia  Mother     .  Depression  Mother     .  Liver cancer  Maternal Grandmother         mets to breast   .  Breast cancer  Maternal Grandmother         liver mets to breast   .  Cancer  Maternal Grandmother         with mets   .  Hyperlipidemia  Father     .  Coronary artery disease  Father     .  Hypertension  Father     .  Stroke  Father         multiple   .  Diabetes  Father     .  Colon cancer  Neg Hx         History      Social History   .  Marital Status:  Married       Spouse Name:  N/A   .  Number of Children:  N/A   .  Years of Education:  N/A      Occupational History   .   Not on file.      Social History Main Topics   .  Smoking status:  Former Smoker -- 0.50 packs/day for 6 years       Types:  Cigarettes       Quit date:  03/01/1989   .  Smokeless tobacco:  Never Used         Comment: Quit in 1991- started in 1983 1/2ppd   .  Alcohol Use:  No   .  Drug Use:  No   .  Sexual Activity:       Partners:  Male       Birth Control/ Protection:  Other-see comments         Comment: vasectomy      Other Topics  Concern   .  Not on file      Social History Narrative     ROS:  Pertinent items are noted in HPI.  PHYSICAL EXAMINATION:    BP 110/76 mmHg  Pulse 72  Resp 16  Ht 5\' 3"  (1.6 m)  LMP 07/11/2014 (Within Days)   General appearance: alert, cooperative and appears stated age Head: Normocephalic, without obvious abnormality, atraumatic Neck: no adenopathy, supple, symmetrical, trachea midline and thyroid normal to inspection and palpation Lungs: clear to auscultation bilaterally Heart: regular rate and rhythm Abdomen: obese, soft, non-tender; bowel sounds normal; no masses,  no organomegaly Extremities: extremities normal, atraumatic, no cyanosis or edema Skin: Skin color, texture, turgor normal. No rashes or lesions Lymph nodes: Cervical, supraclavicular, and axillary nodes normal. No abnormal inguinal nodes palpated Neurologic: Grossly normal  Pelvic: External genitalia:  no lesions              Urethra:  normal appearing urethra with no masses, tenderness or lesions              Bartholins and Skenes: normal                  Vagina: normal appearing vagina with normal color and discharge, no lesions              Cervix: no lesions  Bimanual Exam:  Uterus:  normal size, contour, position, consistency, mobility, non-tender              Adnexa: normal adnexa and no mass, fullness, tenderness              Rectovaginal: Yes.  .  Confirms.              Anus:  normal sphincter tone, no lesions  Chaperone was present for  exam.  ASSESSMENT  Postmenopausal bleeding.   Endometrial masses, suspicious for recurrent polyps. Has small intramural fibroids.    PLAN  Discussion of postmenopausal bleeding and endometrial masses. Discussion of hysteroscopy with Myosure polypectomy, dilation and curettage.   Risks, benefits, and alternatives reviewed. Risks include but are not limited to bleeding, infection, damage to surrounding organs including uterine perforation requiring hospitalization and laparoscopy, pulmonary edema, reaction to anesthesia, DVT, PE, death, need for further treatment and surgery including repeat hysteroscopy, hysterectomy or medical therapy.    Surgical expectations and recovery discussed.   Patient wishes to proceed.  An After Visit Summary was printed and given to the patient.  ___15___ minutes face to face time of which over 50% was spent in counseling.

## 2014-07-29 MED ORDER — DEXTROSE 5 % IV SOLN
3.0000 g | INTRAVENOUS | Status: AC
  Administered 2014-07-30: 3 g via INTRAVENOUS
  Filled 2014-07-29: qty 3000

## 2014-07-30 ENCOUNTER — Ambulatory Visit (HOSPITAL_COMMUNITY): Payer: 59 | Admitting: Anesthesiology

## 2014-07-30 ENCOUNTER — Encounter (HOSPITAL_COMMUNITY): Payer: Self-pay | Admitting: Emergency Medicine

## 2014-07-30 ENCOUNTER — Ambulatory Visit (HOSPITAL_COMMUNITY)
Admission: RE | Admit: 2014-07-30 | Discharge: 2014-07-30 | Disposition: A | Discharge status: Home / Self Care | Payer: 59 | Source: Ambulatory Visit | Attending: Obstetrics and Gynecology | Admitting: Obstetrics and Gynecology

## 2014-07-30 ENCOUNTER — Encounter (HOSPITAL_COMMUNITY)
Admission: RE | Disposition: A | Discharge status: Home / Self Care | Payer: Self-pay | Source: Ambulatory Visit | Attending: Obstetrics and Gynecology

## 2014-07-30 DIAGNOSIS — N950 Postmenopausal bleeding: Secondary | ICD-10-CM | POA: Insufficient documentation

## 2014-07-30 DIAGNOSIS — N840 Polyp of corpus uteri: Secondary | ICD-10-CM | POA: Diagnosis not present

## 2014-07-30 DIAGNOSIS — I10 Essential (primary) hypertension: Secondary | ICD-10-CM | POA: Diagnosis not present

## 2014-07-30 DIAGNOSIS — M1990 Unspecified osteoarthritis, unspecified site: Secondary | ICD-10-CM | POA: Insufficient documentation

## 2014-07-30 DIAGNOSIS — K219 Gastro-esophageal reflux disease without esophagitis: Secondary | ICD-10-CM | POA: Insufficient documentation

## 2014-07-30 DIAGNOSIS — G4733 Obstructive sleep apnea (adult) (pediatric): Secondary | ICD-10-CM | POA: Insufficient documentation

## 2014-07-30 DIAGNOSIS — F329 Major depressive disorder, single episode, unspecified: Secondary | ICD-10-CM | POA: Diagnosis not present

## 2014-07-30 DIAGNOSIS — F419 Anxiety disorder, unspecified: Secondary | ICD-10-CM | POA: Insufficient documentation

## 2014-07-30 DIAGNOSIS — E785 Hyperlipidemia, unspecified: Secondary | ICD-10-CM | POA: Diagnosis not present

## 2014-07-30 DIAGNOSIS — Z87891 Personal history of nicotine dependence: Secondary | ICD-10-CM | POA: Insufficient documentation

## 2014-07-30 DIAGNOSIS — Z9851 Tubal ligation status: Secondary | ICD-10-CM | POA: Insufficient documentation

## 2014-07-30 DIAGNOSIS — Z6841 Body Mass Index (BMI) 40.0 and over, adult: Secondary | ICD-10-CM | POA: Insufficient documentation

## 2014-07-30 DIAGNOSIS — N9489 Other specified conditions associated with female genital organs and menstrual cycle: Secondary | ICD-10-CM | POA: Diagnosis not present

## 2014-07-30 DIAGNOSIS — Z79899 Other long term (current) drug therapy: Secondary | ICD-10-CM | POA: Diagnosis not present

## 2014-07-30 HISTORY — PX: DILATATION & CURETTAGE/HYSTEROSCOPY WITH MYOSURE: SHX6511

## 2014-07-30 LAB — PREGNANCY, URINE: Preg Test, Ur: NEGATIVE

## 2014-07-30 SURGERY — DILATATION & CURETTAGE/HYSTEROSCOPY WITH MYOSURE
Anesthesia: General | Site: Vagina

## 2014-07-30 MED ORDER — DEXAMETHASONE SODIUM PHOSPHATE 4 MG/ML IJ SOLN
INTRAMUSCULAR | Status: AC
Start: 2014-07-30 — End: 2014-07-30
  Filled 2014-07-30: qty 1

## 2014-07-30 MED ORDER — SCOPOLAMINE 1 MG/3DAYS TD PT72: 1.0000 | MEDICATED_PATCH | Freq: Once | TRANSDERMAL | Status: DC

## 2014-07-30 MED ORDER — FENTANYL CITRATE (PF) 250 MCG/5ML IJ SOLN
INTRAMUSCULAR | Status: AC
  Filled 2014-07-30: qty 5

## 2014-07-30 MED ORDER — LIDOCAINE HCL 1 % IJ SOLN
INTRAMUSCULAR | Status: AC
  Filled 2014-07-30: qty 20

## 2014-07-30 MED ORDER — ONDANSETRON HCL 4 MG/2ML IJ SOLN
INTRAMUSCULAR | Status: AC
  Filled 2014-07-30: qty 2

## 2014-07-30 MED ORDER — IBUPROFEN 800 MG PO TABS: 800.0000 mg | ORAL_TABLET | Freq: Three times a day (TID) | ORAL | Status: DC | PRN

## 2014-07-30 MED ORDER — SODIUM CHLORIDE 0.9 % IR SOLN
Status: DC | PRN
  Administered 2014-07-30: 3000 mL

## 2014-07-30 MED ORDER — SUCCINYLCHOLINE CHLORIDE 200 MG/10ML IV SOSY
PREFILLED_SYRINGE | INTRAVENOUS | Status: DC | PRN
  Administered 2014-07-30: 140 mg via INTRAVENOUS

## 2014-07-30 MED ORDER — PROPOFOL 10 MG/ML IV BOLUS
INTRAVENOUS | Status: AC
Start: 2014-07-30 — End: 2014-07-30
  Filled 2014-07-30: qty 20

## 2014-07-30 MED ORDER — DEXAMETHASONE SODIUM PHOSPHATE 10 MG/ML IJ SOLN
INTRAMUSCULAR | Status: DC | PRN
  Administered 2014-07-30: 4 mg via INTRAVENOUS

## 2014-07-30 MED ORDER — LACTATED RINGERS IV SOLN
INTRAVENOUS | Status: DC
  Administered 2014-07-30: 10:00:00 via INTRAVENOUS

## 2014-07-30 MED ORDER — PROMETHAZINE HCL 25 MG/ML IJ SOLN: 6.2500 mg | INTRAMUSCULAR | Status: DC | PRN

## 2014-07-30 MED ORDER — KETOROLAC TROMETHAMINE 30 MG/ML IJ SOLN
INTRAMUSCULAR | Status: AC
Start: 2014-07-30 — End: 2014-07-30
  Filled 2014-07-30: qty 1

## 2014-07-30 MED ORDER — ONDANSETRON HCL 4 MG/2ML IJ SOLN
INTRAMUSCULAR | Status: DC | PRN
  Administered 2014-07-30: 4 mg via INTRAVENOUS

## 2014-07-30 MED ORDER — PROPOFOL 10 MG/ML IV BOLUS
INTRAVENOUS | Status: DC | PRN
  Administered 2014-07-30: 200 mg via INTRAVENOUS

## 2014-07-30 MED ORDER — LIDOCAINE HCL (CARDIAC) 20 MG/ML IV SOLN
INTRAVENOUS | Status: DC | PRN
  Administered 2014-07-30: 80 mg via INTRAVENOUS

## 2014-07-30 MED ORDER — MEPERIDINE HCL 25 MG/ML IJ SOLN: 6.2500 mg | INTRAMUSCULAR | Status: DC | PRN

## 2014-07-30 MED ORDER — FENTANYL CITRATE (PF) 100 MCG/2ML IJ SOLN: 25.0000 ug | INTRAMUSCULAR | Status: DC | PRN

## 2014-07-30 MED ORDER — FENTANYL CITRATE (PF) 100 MCG/2ML IJ SOLN
INTRAMUSCULAR | Status: DC | PRN
  Administered 2014-07-30 (×2): 50 ug via INTRAVENOUS

## 2014-07-30 MED ORDER — KETOROLAC TROMETHAMINE 30 MG/ML IJ SOLN
INTRAMUSCULAR | Status: DC | PRN
  Administered 2014-07-30: 30 mg via INTRAVENOUS

## 2014-07-30 MED ORDER — MIDAZOLAM HCL 2 MG/2ML IJ SOLN
INTRAMUSCULAR | Status: AC
  Filled 2014-07-30: qty 2

## 2014-07-30 MED ORDER — LIDOCAINE HCL (CARDIAC) 20 MG/ML IV SOLN
INTRAVENOUS | Status: AC
  Filled 2014-07-30: qty 5

## 2014-07-30 MED ORDER — SCOPOLAMINE 1 MG/3DAYS TD PT72
1.0000 | MEDICATED_PATCH | Freq: Once | TRANSDERMAL | Status: DC
  Administered 2014-07-30: 1.5 mg via TRANSDERMAL

## 2014-07-30 MED ORDER — LIDOCAINE HCL 1 % IJ SOLN
INTRAMUSCULAR | Status: DC | PRN
  Administered 2014-07-30: 10 mL

## 2014-07-30 MED ORDER — SCOPOLAMINE 1 MG/3DAYS TD PT72
MEDICATED_PATCH | TRANSDERMAL | Status: AC
  Administered 2014-07-30: 1.5 mg via TRANSDERMAL
  Filled 2014-07-30: qty 1

## 2014-07-30 MED ORDER — LACTATED RINGERS IV SOLN
INTRAVENOUS | Status: DC
  Administered 2014-07-30 (×2): via INTRAVENOUS

## 2014-07-30 SURGICAL SUPPLY — 20 items
CANISTER SUCT 3000ML (MISCELLANEOUS) ×2 IMPLANT
CATH ROBINSON RED A/P 16FR (CATHETERS) ×2 IMPLANT
CLOTH BEACON ORANGE TIMEOUT ST (SAFETY) ×2 IMPLANT
CONTAINER PREFILL 10% NBF 60ML (FORM) ×4 IMPLANT
DEVICE MYOSURE CLASSIC (MISCELLANEOUS) IMPLANT
DEVICE MYOSURE LITE (MISCELLANEOUS) ×2 IMPLANT
ELECT REM PT RETURN 9FT ADLT (ELECTROSURGICAL)
ELECTRODE REM PT RTRN 9FT ADLT (ELECTROSURGICAL) IMPLANT
FILTER ARTHROSCOPY CONVERTOR (FILTER) ×2 IMPLANT
GLOVE BIO SURGEON STRL SZ 6.5 (GLOVE) ×2 IMPLANT
GLOVE BIOGEL PI IND STRL 7.0 (GLOVE) ×1 IMPLANT
GLOVE BIOGEL PI INDICATOR 7.0 (GLOVE) ×1
GOWN STRL REUS W/TWL LRG LVL3 (GOWN DISPOSABLE) ×4 IMPLANT
PACK VAGINAL MINOR WOMEN LF (CUSTOM PROCEDURE TRAY) ×2 IMPLANT
PAD OB MATERNITY 4.3X12.25 (PERSONAL CARE ITEMS) ×2 IMPLANT
SEAL ROD LENS SCOPE MYOSURE (ABLATOR) ×2 IMPLANT
TOWEL OR 17X24 6PK STRL BLUE (TOWEL DISPOSABLE) ×4 IMPLANT
TUBING AQUILEX INFLOW (TUBING) ×2 IMPLANT
TUBING AQUILEX OUTFLOW (TUBING) ×2 IMPLANT
WATER STERILE IRR 1000ML POUR (IV SOLUTION) ×2 IMPLANT

## 2014-07-30 NOTE — Progress Notes (Signed)
No marked change in status since office preop visit.  Patient examined.   OK to proceed with surgery.

## 2014-07-30 NOTE — Transfer of Care (Signed)
Immediate Anesthesia Transfer of Care Note  Patient: Alejandra Miller  Procedure(s) Performed: Procedure(s) with comments: DILATATION & CURETTAGE/HYSTEROSCOPY WITH MYOSURE (N/A) - extra time for BMI 49.  Patient Location: PACU  Anesthesia Type:General  Level of Consciousness: awake, alert , oriented and patient cooperative  Airway & Oxygen Therapy: Patient Spontanous Breathing and Patient connected to nasal cannula oxygen  Post-op Assessment: Report given to RN and Post -op Vital signs reviewed and stable  Post vital signs: Reviewed and stable  Last Vitals:  Filed Vitals:   07/30/14 1012  BP: 117/74  Pulse: 69  Temp: 36.8 C  Resp: 16    Complications: No apparent anesthesia complications

## 2014-07-30 NOTE — Anesthesia Procedure Notes (Signed)
Procedure Name: Intubation Date/Time: 07/30/2014 1:38 PM Performed by: Tobin Chad Pre-anesthesia Checklist: Patient identified, Emergency Drugs available, Suction available, Patient being monitored and Timeout performed Patient Re-evaluated:Patient Re-evaluated prior to inductionOxygen Delivery Method: Circle system utilized and Simple face mask Preoxygenation: Pre-oxygenation with 100% oxygen Intubation Type: IV induction Laryngoscope Size: Mac and 3 Grade View: Grade III Tube type: Oral Tube size: 7.0 mm Number of attempts: 1 Airway Equipment and Method: Stylet Placement Confirmation: positive ETCO2 and CO2 detector Secured at: 22 cm Tube secured with: Tape Dental Injury: Teeth and Oropharynx as per pre-operative assessment  Difficulty Due To: Difficulty was anticipated, Difficult Airway- due to anterior larynx and Difficult Airway- due to limited oral opening Future Recommendations: Recommend- induction with short-acting agent, and alternative techniques readily available

## 2014-07-30 NOTE — Op Note (Signed)
OPERATIVE REPORT   PREOPERATIVE DIAGNOSES:   Postmenopausal bleeding, endometrial mass  POSTOPERATIVE DIAGNOSES:   Postmenopausal bleeding, endometrial mass  PROCEDURE:  Hysteroscopy with dilation and curettage and Myosure resection of endometrial polyp  SURGEON:  Lenard Galloway, MD  ANESTHESIA:  LMA, paracervical block with 10 mL of 1% lidocaine.  IV FLUIDS:   1700 cc LR  EBL:  5 cc  URINE OUTPUT: 75 cc  NORMAL SALINE DEFICIT:   564 cc  COMPLICATIONS:  None.  INDICATIONS FOR THE PROCEDURE:     The patient is a 54 year old G0P0 female with postmenopausal bleeding.  Bled again on 07/11/14 for 2 days, not heavy.   FSH 50.3 and estradiol 21.9 on 04/05/14.  History of benign endometrial endometrioid polyps removed by hysteroscopy on 11/24/12.  Ultrasound - 04/25/14 Uterus with 4 fibroids, all about 1 cm each.  EMS 4.03 with echogenic area at fundus suspicious for polyp.  Ovaries with right ovarian follicles under 15 mm and left ovary with collapsed CL? 11 mm.  No free fluid.  Sonohysterogram showed 68filling defects; 13 mm on left and 7 mm on right cornual regions.  EMB 04/25/14 - weakly proliferative endometrium, negative for hyperplasia or malignancy.   A plan is now made to proceed with a hysteroscopy with dilation and curettage and  Myosure resection of endometrial masses; after risks, benefits and alternatives were reviewed.  FINDINGS:  Exam under anesthesia revealed a small anteverted uterus.  The uterus was sounded to  7 cm. Hysteroscopy showed a small polyp of the left cornual region.  This was entirely removed with the Myosure.  There was not evidence of any intrauterine fibroid. Endometrial currettings were scant.   SPECIMENS:  The endometrial polyp and endometrial curettings were sent to Pathology separately.   PROCEDURE IN DETAIL:  The patient was reidentified in the preoperative hold area.  She did receive Ancef IV for  antibiotic prophylaxis and she received TED hose and PAS stockings for DVT prophylaxis.  In the operating room, the patient was placed in the dorsal lithotomy position and then an LMA anesthetic was introduced.  The patient's lower abdomen, vagina and perineum were sterilely prepped with Betadine and the  patient's bladder was catheterized of urine.  She was sterilly draped  An exam under anesthesia was performed.  A speculum was placed inside the vagina and a single-tooth tenaculum was placed on the anterior cervical lip.  A paracervical block was performed with a total of 10 mL of 1% lidocaine plain.  The uterus was sounded. The cervix was dilated to a #21 Pratt dilator.  The MyoSure hysteroscope was then inserted inside the uterine cavity under the continuous infusion of normal saline solution.  Findings are as noted above.  The MyoSure hysteroscope was used to remove the polyp, and this was sent to pathology.  The Myosure hysteroscope was then removed.    The cervix was further dilated to a #23 Pratt dilator.  The sharp and then a  serrated curette were introduced into the uterine cavity and the endometrium was  curetted in all 4 quadrants.  A minimal amount of endometrial curettings was obtained.   This specimen was sent to Pathology separately from the polyp.   The single-tooth tenaculum which had been placed on the anterior cervical lip was removed.  Hemostasis was good, and all of the vaginal instruments were removed.  The patient was awakened and escorted to the recovery room in stable condition after she was cleansed with Betadine.  There were no complications to the procedure.  All needle, instrument and sponge counts were correct.  Lenard Galloway, MD

## 2014-07-30 NOTE — Anesthesia Postprocedure Evaluation (Signed)
  Anesthesia Post-op Note  Patient: Alejandra Miller  Procedure(s) Performed: Procedure(s) with comments: DILATATION & CURETTAGE/HYSTEROSCOPY WITH MYOSURE (N/A) - extra time for BMI 49.  Patient Location: PACU  Anesthesia Type:General  Level of Consciousness: awake, alert  and oriented  Airway and Oxygen Therapy: Patient Spontanous Breathing  Post-op Pain: none  Post-op Assessment: Post-op Vital signs reviewed, Patient's Cardiovascular Status Stable, Respiratory Function Stable, Patent Airway, No signs of Nausea or vomiting and Pain level controlled  Post-op Vital Signs: Reviewed and stable  Last Vitals:  Filed Vitals:   07/30/14 1500  BP: 110/69  Pulse: 72  Temp:   Resp: 17    Complications: No apparent anesthesia complications

## 2014-07-30 NOTE — Brief Op Note (Signed)
07/30/2014  2:21 PM  PATIENT:  Alejandra Miller  54 y.o. female  PRE-OPERATIVE DIAGNOSIS:  endometrial mass, postmenopausal bleeding  POST-OPERATIVE DIAGNOSIS:  endometrial mass, postmenopausal bleeding  PROCEDURE:  Procedure(s) with comments: DILATATION & CURETTAGE/HYSTEROSCOPY WITH MYOSURE (N/A) - extra time for BMI 49.  SURGEON:  Surgeon(s) and Role:    * Keerthi Hazell E Yisroel Ramming, MD - Primary  PHYSICIAN ASSISTANT: NA  ASSISTANTS: none   ANESTHESIA:   paracervical block and LMA  EBL:  Total I/O In: 1700 [I.V.:1700] Out: 75 [Urine:75]  BLOOD ADMINISTERED:none  DRAINS: none   LOCAL MEDICATIONS USED:  LIDOCAINE  and Amount: 10 ml  SPECIMEN:  Source of Specimen:  endometrial polyp, endometrial curettings  DISPOSITION OF SPECIMEN:  PATHOLOGY  COUNTS:  YES  TOURNIQUET:  * No tourniquets in log *  DICTATION: .Note written in EPIC  PLAN OF CARE: Discharge to home after PACU  PATIENT DISPOSITION:  PACU - hemodynamically stable.   Delay start of Pharmacological VTE agent (>24hrs) due to surgical blood loss or risk of bleeding: not applicable

## 2014-07-30 NOTE — Discharge Instructions (Signed)

## 2014-07-31 ENCOUNTER — Encounter (HOSPITAL_COMMUNITY): Payer: Self-pay | Admitting: Obstetrics and Gynecology

## 2014-08-06 ENCOUNTER — Telehealth: Payer: Self-pay | Admitting: Obstetrics and Gynecology

## 2014-08-06 NOTE — Telephone Encounter (Addendum)
No call to patient completed as phone numbers do not match the number of patient's release of information signed on 10/12/13.  Would like to communicate pathology results showing benign endometrium.   Specimen of polyp was too small for analysis.   Will release results through My Chart along with a message to patient.   Will need to sign new DPI form in the office at post op visit.

## 2014-08-14 ENCOUNTER — Telehealth: Payer: Self-pay | Admitting: Obstetrics and Gynecology

## 2014-08-14 NOTE — Telephone Encounter (Signed)
FYI only--patient rescheduled her two week post op appointment with Dr. Quincy Simmonds from 08/21/14 to 08/23/14. She says she is doing "quite well" but had a schedule conflict.

## 2014-08-21 ENCOUNTER — Ambulatory Visit: Payer: 59 | Admitting: Obstetrics and Gynecology

## 2014-08-23 ENCOUNTER — Ambulatory Visit (INDEPENDENT_AMBULATORY_CARE_PROVIDER_SITE_OTHER): Payer: 59 | Admitting: Obstetrics and Gynecology

## 2014-08-23 ENCOUNTER — Encounter: Payer: Self-pay | Admitting: Obstetrics and Gynecology

## 2014-08-23 VITALS — BP 112/78 | HR 68 | Temp 97.9°F | Resp 20 | Ht 63.0 in | Wt 278.0 lb

## 2014-08-23 DIAGNOSIS — N939 Abnormal uterine and vaginal bleeding, unspecified: Secondary | ICD-10-CM

## 2014-08-23 MED ORDER — NORETHINDRONE 0.35 MG PO TABS: 1.0000 | ORAL_TABLET | Freq: Every day | ORAL | Status: DC

## 2014-08-23 NOTE — Patient Instructions (Signed)
Norethindrone tablets (contraception) What is this medicine? NORETHINDRONE (nor eth IN drone) is an oral contraceptive. The product contains a female hormone known as a progestin. It is used to prevent pregnancy. This medicine may be used for other purposes; ask your health care provider or pharmacist if you have questions. COMMON BRAND NAME(S): Camila, Deblitane 28-Day, Errin, Heather, [place], [place], [place], Nor-QD, Nora-BE, Norlyroc, Ortho Micronor, [employer] 28-Day What should I tell my health care provider before I take this medicine? They need to know if you have any of these conditions: -blood vessel disease or blood clots -breast, cervical, or vaginal cancer -diabetes -heart disease -kidney disease -liver disease -mental depression -migraine -seizures -stroke -vaginal bleeding -an unusual or allergic reaction to norethindrone, other medicines, foods, dyes, or preservatives -pregnant or trying to get pregnant -breast-feeding How should I use this medicine? Take this medicine by mouth with a glass of water. You may take it with or without food. Follow the directions on the prescription label. Take this medicine at the same time each day and in the order directed on the package. Do not take your medicine more often than directed. Contact your pediatrician regarding the use of this medicine in children. Special care may be needed. This medicine has been used in female children who have started having menstrual periods. A patient package insert for the product will be given with each prescription and refill. Read this sheet carefully each time. The sheet may change frequently. Overdosage: If you think you have taken too much of this medicine contact a poison control center or emergency room at once. NOTE: This medicine is only for you. Do not share this medicine with others. What if I miss a dose? Try not to miss a dose. Every time you miss a dose or take a dose late your chance of  pregnancy increases. When 1 pill is missed (even if only 3 hours late), take the missed pill as soon as possible and continue taking a pill each day at the regular time (use a back up method of birth control for the next 48 hours). If more than 1 dose is missed, use an additional birth control method for the rest of your pill pack until menses occurs. Contact your health care professional if more than 1 dose has been missed. What may interact with this medicine? Do not take this medicine with any of the following medications: -amprenavir or fosamprenavir -bosentan This medicine may also interact with the following medications: -antibiotics or medicines for infections, especially rifampin, rifabutin, rifapentine, and griseofulvin, and possibly penicillins or tetracyclines -aprepitant -barbiturate medicines, such as phenobarbital -carbamazepine -felbamate -modafinil -oxcarbazepine -phenytoin -ritonavir or other medicines for HIV infection or AIDS -St. John's wort -topiramate This list may not describe all possible interactions. Give your health care provider a list of all the medicines, herbs, non-prescription drugs, or dietary supplements you use. Also tell them if you smoke, drink alcohol, or use illegal drugs. Some items may interact with your medicine. What should I watch for while using this medicine? Visit your doctor or health care professional for regular checks on your progress. You will need a regular breast and pelvic exam and Pap smear while on this medicine. Use an additional method of birth control during the first cycle that you take these tablets. If you have any reason to think you are pregnant, stop taking this medicine right away and contact your doctor or health care professional. If you are taking this medicine for hormone related problems, it  may take several cycles of use to see improvement in your condition. This medicine does not protect you against HIV infection (AIDS)  or any other sexually transmitted diseases. What side effects may I notice from receiving this medicine? Side effects that you should report to your doctor or health care professional as soon as possible: -breast tenderness or discharge -pain in the abdomen, chest, groin or leg -severe headache -skin rash, itching, or hives -sudden shortness of breath -unusually weak or tired -vision or speech problems -yellowing of skin or eyes Side effects that usually do not require medical attention (report to your doctor or health care professional if they continue or are bothersome): -changes in sexual desire -change in menstrual flow -facial hair growth -fluid retention and swelling -headache -irritability -nausea -weight gain or loss This list may not describe all possible side effects. Call your doctor for medical advice about side effects. You may report side effects to FDA at 1-800-FDA-1088. Where should I keep my medicine? Keep out of the reach of children. Store at room temperature between 15 and 30 degrees C (59 and 86 degrees F). Throw away any unused medicine after the expiration date. NOTE: This sheet is a summary. It may not cover all possible information. If you have questions about this medicine, talk to your doctor, pharmacist, or health care provider.  2015, Elsevier/Gold Standard. (2011-11-05 16:41:35)

## 2014-08-23 NOTE — Progress Notes (Signed)
GYNECOLOGY  VISIT   HPI: 54 y.o.   Married  Caucasian  female   G0P0 with Patient's last menstrual period was 07/11/2014 (within days).   here for   Post Op from 07/30/14  Patient's husband is present for the entire visit.  DILATATION & CURETTAGE/HYSTEROSCOPY WITH MYOSURE Pathology report - polypoid tissue not present in tubing, too small for analysis.  Endometrial currettings - inactive endometrium.  Does have a history of fibroids as well.  Hx of recurrent endometrial polyps.  Status post dilation and curettage in 2014.  Had pinkish tinge on toilet paper in the last week.   Had a Copper T IUD in the past.  Had a Mirena IUD in the past and had problems with expulsion.  [place] - 53.7, estradiol 33.5 - 10/12/13  GYNECOLOGIC HISTORY: Patient's last menstrual period was 07/11/2014 (within days). Contraception: Vasectomy  Menopausal hormone therapy: N/A Last mammogram: 01/01/14 BIRADS1:neg Last pap smear: 10/12/13 Neg. HR HPV:neg        OB History    Gravida Para Term Preterm AB TAB SAB Ectopic Multiple Living   0                  Patient Active Problem List   Diagnosis Date Noted  . Bursitis of shoulder 11/05/2013  . Skin ulcer 10/12/2013  . Cervical cancer screening 07/20/2012  . Obesity 02/12/2012  . Leg swelling 12/08/2011  . Annual physical exam 07/28/2011  . Menstrual irregularity 12/15/2010  . Vitamin D deficiency 12/15/2010  . OSA (obstructive sleep apnea) 08/11/2010  . Arthralgia 07/28/2010  . Hyperlipidemia 07/28/2010  . Hypertension 07/28/2010  . Depression 07/28/2010    Past Medical History  Diagnosis Date  . Depression     counseling in 2010 for 2 months on medication  . GERD (gastroesophageal reflux disease) 2005    treated with omeprazole  . Seasonal allergies     affected in the spring  . Hypertension 2002  . High cholesterol 2002  . History of sinusitis     once/twice per year  . Fibrocystic breast 1988  . Cervical cancer screening 07/20/2012    Menarche at 13, regular til recently No h/o abnormal paps, last pap 2-3 years ago G0P0  MGM today, no h/o abnormal MGMs Spotting now monthly and scant H/o uterine polyps  . Uterine polyp   . Anxiety     NO MEDS  . OSA (obstructive sleep apnea)     +sleep study 08/2010., DOES NOT USE CPAP, USES MOSES MOUTH PIECE  . Osteoarthritis     KNEES, HANDS, HIPS  . Sleep apnea     mouthpiece "[facility]"  . MRSA (methicillin resistant staph aureus) culture positive 2007  . Plantar fasciitis of left foot 03/2014  . Fibroids 2014    Past Surgical History  Procedure Laterality Date  . Tonsillectomy  1969  . Wrist fracture surgery Right 07/2012  . Wisdom tooth extraction  1979  . Dilatation & currettage/hysteroscopy with resectocope N/A 11/24/2012    Procedure: DILATATION & CURETTAGE/HYSTEROSCOPY WITH RESECTOCOPE;  Surgeon: Arloa Koh, MD;  Location: [facility];  Service: Gynecology;  Laterality: N/A;  . Essure tubal ligation Left   . Dilatation & curettage/hysteroscopy with myosure N/A 07/30/2014    Procedure: DILATATION & CURETTAGE/HYSTEROSCOPY WITH MYOSURE;  Surgeon: Nunzio Cobbs, MD;  Location: [facility];  Service: Gynecology;  Laterality: N/A;  extra time for BMI 49.    Current Outpatient Prescriptions  Medication Sig Dispense Refill  . amLODipine (  NORVASC) 5 MG tablet take 1 tablet by mouth once daily 30 tablet 4  . atorvastatin (LIPITOR) 20 MG tablet take 1 tablet by mouth once daily 30 tablet 4  . bisoprolol-hydrochlorothiazide (ZIAC) 5-6.25 MG per tablet take 2 tablets by mouth once daily 60 tablet 6  . buPROPion (WELLBUTRIN SR) 150 MG 12 hr tablet take 1 tablet by mouth twice a day 60 tablet 4  . Cholecalciferol (VITAMIN D3) 1000 UNITS CAPS Take 1 capsule by mouth daily.      . citalopram (CELEXA) 20 MG tablet take 1 tablet by mouth once daily 30 tablet 6  . cyclobenzaprine (FLEXERIL) 10 MG tablet take 1 tablet by mouth daily if needed (Patient taking differently: Take 10 mg by mouth  daily as needed for muscle spasms. ) 30 tablet 2  . fluticasone (FLONASE) 50 MCG/ACT nasal spray Place 1 spray into both nostrils at bedtime.   0  . ibuprofen (ADVIL,MOTRIN) 800 MG tablet Take 1 tablet (800 mg total) by mouth every 8 (eight) hours as needed. 30 tablet 0  . Multiple Vitamin (MULTIVITAMIN) tablet Take 1 tablet by mouth daily.      . mupirocin cream (BACTROBAN) 2 % Apply 1 application topically 3 (three) times daily. (Patient taking differently: Apply 1 application topically 3 (three) times daily as needed. ) 30 g 0  . Omega-3 Fatty Acids (FISH OIL) 1000 MG CAPS Take 1,000 mg by mouth daily.    Marland Kitchen omeprazole (PRILOSEC) 40 MG capsule take 1 capsule by mouth once daily 30 capsule 6  . VITAMIN A PO Take 10,000 Units by mouth daily.     . vitamin B-12 (CYANOCOBALAMIN) 1000 MCG tablet Take 1,000 mcg by mouth daily.     No current facility-administered medications for this visit.     ALLERGIES: Review of patient's allergies indicates no known allergies.  Family History  Problem Relation Age of Onset  . Osteoarthritis Mother   . Hyperlipidemia Mother   . Depression Mother   . Liver cancer Maternal Grandmother     mets to breast  . Breast cancer Maternal Grandmother     liver mets to breast  . Cancer Maternal Grandmother     with mets  . Hyperlipidemia Father   . Coronary artery disease Father   . Hypertension Father   . Stroke Father     multiple  . Diabetes Father   . Colon cancer Neg Hx     History   Social History  . Marital Status: Married    Spouse Name: N/A  . Number of Children: N/A  . Years of Education: N/A   Occupational History  . Not on file.   Social History Main Topics  . Smoking status: Former Smoker -- 0.50 packs/day for 6 years    Types: Cigarettes    Quit date: 03/01/1989  . Smokeless tobacco: Never Used     Comment: Quit in 1991- started in 1983 1/2ppd  . Alcohol Use: No  . Drug Use: No  . Sexual Activity:    Partners: Male    Birth  Control/ Protection: Other-see comments     Comment: vasectomy   Other Topics Concern  . Not on file   Social History Narrative    ROS:  Pertinent items are noted in HPI.  PHYSICAL EXAMINATION:    BP 112/78 mmHg  Pulse 68  Temp(Src) 97.9 F (36.6 C) (Oral)  Resp 20  Ht 5\' 3"  (1.6 m)  Wt 278 lb (126.1 kg)  BMI  49.26 kg/m2  LMP 07/11/2014 (Within Days)    General appearance: alert, cooperative and appears stated age   Pelvic: External genitalia:  no lesions              Urethra:  normal appearing urethra with no masses, tenderness or lesions              Bartholins and Skenes: normal                 Vagina: normal appearing vagina with normal color and discharge, no lesions              Cervix: no lesions          Bimanual Exam:  Uterus:  normal size, contour, position, consistency, mobility, non-tender              Adnexa: normal adnexa and no mass, fullness, tenderness               Chaperone was present for exam.  ASSESSMENT  Status post hysteroscopy with Myosure resection of small endometrial polyp, dilation and curettage. Specimen too small for analysis of polypoid region. Benign endometrial curettings. Risk for endometrial hyperplasia due to BMI. Uterine fibroids.  Elevated FSH and estradiol in perimenopausal range.  PLAN  Counseled regarding pathology report from surgery.   Counseled regarding risk for future endometrial hyperplasia/cancer based on body habitus. Recommendation for progesterone therapy.   Will start Micronor daily. Follow up in 3 months for recheck and annual exam.   An After Visit Summary was printed and given to the patient.  __25____ minutes face to face time of which over 50% was spent in counseling.

## 2014-10-21 ENCOUNTER — Telehealth: Payer: Self-pay | Admitting: Family Medicine

## 2014-10-21 NOTE — Telephone Encounter (Signed)
Notified pt. She is going to check her calendar and call us back with a couple dates to see if we can work something out.

## 2014-10-21 NOTE — Telephone Encounter (Signed)
If she can come in early one day around 7:15 I can meet her early. Find out if that works and what days of the week work best for her and we can come up with a morning in late September or October that works for her. Thx

## 2014-10-21 NOTE — Telephone Encounter (Signed)
Pt was scheduled for cpe 11/08/14 but needed rescheduled due to provider schedule change moved to 04/24/15. I have added pt to wait list. Is there any time that I could use for pt to come in sooner?

## 2014-10-25 NOTE — Telephone Encounter (Signed)
Advise what lab to order for her.

## 2014-10-25 NOTE — Telephone Encounter (Signed)
Scheduled pt for 11/29/14 3:45pm (pt requesting a Friday). Used an acute. Pt scheduled for 11/22/14 for labs at her request for previsit labs. Please advise if this needs changed.

## 2014-10-25 NOTE — Telephone Encounter (Signed)
That is fine just put her in where you can fit her.

## 2014-10-25 NOTE — Telephone Encounter (Signed)
Needs lipid, cmp, tsh, cbc, vitamin D prior to visit for vitamin D def, essential HTN, hyperlipidemia, mixed

## 2014-10-25 NOTE — Telephone Encounter (Signed)
Pt called back. Her company just merged and she isn't able to schedule a day to come in early right now. I have her on the wait list for cancellations. She is asking if she should come in for a regular appt & labs since her cpe was moved out so far?

## 2014-10-28 ENCOUNTER — Other Ambulatory Visit: Payer: Self-pay | Admitting: Family Medicine

## 2014-10-28 DIAGNOSIS — I10 Essential (primary) hypertension: Secondary | ICD-10-CM

## 2014-10-28 DIAGNOSIS — E559 Vitamin D deficiency, unspecified: Secondary | ICD-10-CM

## 2014-10-28 DIAGNOSIS — E782 Mixed hyperlipidemia: Secondary | ICD-10-CM

## 2014-10-28 NOTE — Telephone Encounter (Signed)
Put order in for labs called the patient left message to call back

## 2014-10-28 NOTE — Telephone Encounter (Signed)
Labs entered for lab appt. On 11/22/14

## 2014-10-28 NOTE — Telephone Encounter (Signed)
Patient informed. 

## 2014-11-08 ENCOUNTER — Ambulatory Visit (INDEPENDENT_AMBULATORY_CARE_PROVIDER_SITE_OTHER): Payer: Managed Care, Other (non HMO) | Admitting: Obstetrics and Gynecology

## 2014-11-08 ENCOUNTER — Encounter: Payer: Self-pay | Admitting: Obstetrics and Gynecology

## 2014-11-08 ENCOUNTER — Encounter: Payer: 59 | Admitting: Family Medicine

## 2014-11-08 VITALS — BP 120/76 | HR 70 | Resp 20 | Ht 64.0 in | Wt 279.0 lb

## 2014-11-08 DIAGNOSIS — Z0000 Encounter for general adult medical examination without abnormal findings: Secondary | ICD-10-CM

## 2014-11-08 DIAGNOSIS — Z01419 Encounter for gynecological examination (general) (routine) without abnormal findings: Secondary | ICD-10-CM | POA: Diagnosis not present

## 2014-11-08 LAB — POCT URINALYSIS DIPSTICK
BILIRUBIN UA: NEGATIVE
Glucose, UA: NEGATIVE
KETONES UA: NEGATIVE
LEUKOCYTES UA: NEGATIVE
Nitrite, UA: NEGATIVE
PH UA: 6
PROTEIN UA: NEGATIVE
RBC UA: NEGATIVE
Urobilinogen, UA: NEGATIVE

## 2014-11-08 MED ORDER — NORETHINDRONE ACETATE 5 MG PO TABS: 2.5000 mg | ORAL_TABLET | Freq: Every day | ORAL | Status: DC

## 2014-11-08 NOTE — Progress Notes (Signed)
Patient ID: Alejandra Miller, female   DOB: Aug 01, 1960, 54 y.o.   MRN: [id number] 54 y.o. G0P0 Married Caucasian female here for annual exam.   Patient is on Micronor.  Spotting often.  Had had two prior hysteroscopies with dilation and curettage for benign polyps. Labs consistent with perimenopause.  Menopausal FSH level but slight elevated E2.   Menses 8/4 - 8/8. Pink spotting 8/15- 8/18, 8/9 - 8/31.  Lots of PMS and cramping.   July menses from 7/1 - 7/6.  July 21 - 23 pinkish spotting.   June random spotting on 6/6 - 6/7, 6/18, 6/23.  Living in new home for one year.   Will go to a new weight loss clinic - [facility].   PCP:  Gwyneth Revels, MD - will do labs and med check appointment.  Patient's last menstrual period was 10/17/2014 (exact date).          Sexually active: Yes.   female partner The current method of family planning is vasectomy.    Exercising: No.   Smoker:  no  Health Maintenance: Pap:  10-12-13 Neg:Neg HR HPV History of abnormal Pap:  no MMG:  12-31-13 Density Cat.B/Neg:[facility] Colonoscopy:  10-22-13 normal with Dr. Carlean Purl. Next due 09/2023. BMD:   n/a  Result  n/a TDaP:  01-22-13 Screening Labs:  Hb today: PCP, Urine today: Neg   reports that she quit smoking about 25 years ago. Her smoking use included Cigarettes. She has a 3 pack-year smoking history. She has never used smokeless tobacco. She reports that she does not drink alcohol or use illicit drugs.  Past Medical History  Diagnosis Date  . Depression     counseling in 2010 for 2 months on medication  . GERD (gastroesophageal reflux disease) 2005    treated with omeprazole  . Seasonal allergies     affected in the spring  . Hypertension 2002  . High cholesterol 2002  . History of sinusitis     once/twice per year  . Fibrocystic breast 1988  . Cervical cancer screening 07/20/2012    Menarche at 13, regular til recently No h/o abnormal paps, last pap 2-3 years ago G0P0  MGM today,  no h/o abnormal MGMs Spotting now monthly and scant H/o uterine polyps  . Uterine polyp   . Anxiety     NO MEDS  . OSA (obstructive sleep apnea)     +sleep study 08/2010., DOES NOT USE CPAP, USES MOSES MOUTH PIECE  . Osteoarthritis     KNEES, HANDS, HIPS  . Sleep apnea     mouthpiece "[facility]"  . MRSA (methicillin resistant staph aureus) culture positive 2007  . Plantar fasciitis of left foot 03/2014  . Fibroids 2014    Past Surgical History  Procedure Laterality Date  . Tonsillectomy  1969  . Wrist fracture surgery Right 07/2012  . Wisdom tooth extraction  1979  . Dilatation & currettage/hysteroscopy with resectocope N/A 11/24/2012    Procedure: DILATATION & CURETTAGE/HYSTEROSCOPY WITH RESECTOCOPE;  Surgeon: Arloa Koh, MD;  Location: [facility];  Service: Gynecology;  Laterality: N/A;  . Essure tubal ligation Left   . Dilatation & curettage/hysteroscopy with myosure N/A 07/30/2014    Procedure: DILATATION & CURETTAGE/HYSTEROSCOPY WITH MYOSURE;  Surgeon: Nunzio Cobbs, MD;  Location: [facility];  Service: Gynecology;  Laterality: N/A;  extra time for BMI 49.    Current Outpatient Prescriptions  Medication Sig Dispense Refill  . amLODipine (NORVASC) 5 MG tablet  take 1 tablet by mouth once daily 30 tablet 4  . atorvastatin (LIPITOR) 20 MG tablet take 1 tablet by mouth once daily 30 tablet 4  . bisoprolol-hydrochlorothiazide (ZIAC) 5-6.25 MG per tablet take 2 tablets by mouth once daily 60 tablet 6  . buPROPion (WELLBUTRIN SR) 150 MG 12 hr tablet take 1 tablet by mouth twice a day 60 tablet 4  . Cholecalciferol (VITAMIN D3) 1000 UNITS CAPS Take 1 capsule by mouth daily.      . citalopram (CELEXA) 20 MG tablet take 1 tablet by mouth once daily 30 tablet 6  . cyclobenzaprine (FLEXERIL) 10 MG tablet take 1 tablet by mouth daily if needed (Patient taking differently: Take 10 mg by mouth daily as needed for muscle spasms. ) 30 tablet 2  . fluticasone (FLONASE) 50 MCG/ACT nasal spray Place  1 spray into both nostrils at bedtime.   0  . ibuprofen (ADVIL,MOTRIN) 800 MG tablet Take 1 tablet (800 mg total) by mouth every 8 (eight) hours as needed. 30 tablet 0  . Multiple Vitamin (MULTIVITAMIN) tablet Take 1 tablet by mouth daily.      . mupirocin cream (BACTROBAN) 2 % Apply 1 application topically 3 (three) times daily. (Patient taking differently: Apply 1 application topically 3 (three) times daily as needed. ) 30 g 0  . norethindrone (MICRONOR,CAMILA,ERRIN) 0.35 MG tablet Take 1 tablet (0.35 mg total) by mouth daily. 1 Package 3  . Omega-3 Fatty Acids (FISH OIL) 1000 MG CAPS Take 1,000 mg by mouth daily.    Marland Kitchen omeprazole (PRILOSEC) 40 MG capsule take 1 capsule by mouth once daily 30 capsule 6  . VITAMIN A PO Take 10,000 Units by mouth daily.     . vitamin B-12 (CYANOCOBALAMIN) 1000 MCG tablet Take 1,000 mcg by mouth daily.     No current facility-administered medications for this visit.    Family History  Problem Relation Age of Onset  . Osteoarthritis Mother   . Hyperlipidemia Mother   . Depression Mother   . Liver cancer Maternal Grandmother     mets to breast  . Breast cancer Maternal Grandmother     liver mets to breast  . Cancer Maternal Grandmother     with mets  . Hyperlipidemia Father   . Coronary artery disease Father   . Hypertension Father   . Stroke Father     multiple  . Diabetes Father   . Colon cancer Neg Hx     ROS:  Pertinent items are noted in HPI.  Otherwise, a comprehensive ROS was negative.  Exam:   BP 120/76 mmHg  Pulse 70  Resp 20  Ht 5\' 4"  (1.626 m)  Wt 279 lb (126.554 kg)  BMI 47.87 kg/m2  LMP 10/17/2014 (Exact Date)    General appearance: alert, cooperative and appears stated age Head: Normocephalic, without obvious abnormality, atraumatic Neck: no adenopathy, supple, symmetrical, trachea midline and thyroid normal to inspection and palpation Lungs: clear to auscultation bilaterally Breasts: normal appearance, no masses or  tenderness, Inspection negative, No nipple retraction or dimpling, No nipple discharge or bleeding, No axillary or supraclavicular adenopathy Heart: regular rate and rhythm Abdomen: soft, non-tender; bowel sounds normal; no masses,  no organomegaly Extremities: extremities normal, atraumatic, no cyanosis or edema Skin: Skin color, texture, turgor normal. No rashes or lesions Lymph nodes: Cervical, supraclavicular, and axillary nodes normal. No abnormal inguinal nodes palpated Neurologic: Grossly normal  Pelvic: External genitalia:  no lesions  Urethra:  normal appearing urethra with no masses, tenderness or lesions              Bartholins and Skenes: normal                 Vagina: normal appearing vagina with normal color and discharge, no lesions              Cervix: no lesions              Pap taken: No. Bimanual Exam:  Uterus:  normal size, contour, position, consistency, mobility, non-tender              Adnexa: normal adnexa and no mass, fullness, tenderness              Rectovaginal: Yes.  .  Confirms.              Anus:  normal sphincter tone, no lesions  Chaperone was present for exam.  Assessment:   Well woman visit with normal exam. Irregular bleeding on Micronor.  Hx of endometrial polyps.  Perimenopausal female.  Plan: Yearly mammogram recommended after age 74.  Recommended self breast exam.  Pap and HR HPV as above. Discussed Calcium, Vitamin D, regular exercise program including cardiovascular and weight bearing exercise. Labs performed.  No..   See orders. Refills given on medications.  Yes.  .  See orders.  Stop Micronor and start Aygestin 2.5 mg daily.  Discussed side effects.  Follow up in 3 months.  Information to patient on [facility].  Follow up annually and prn.      After visit summary provided.

## 2014-11-08 NOTE — Patient Instructions (Signed)

## 2014-11-09 ENCOUNTER — Encounter: Payer: Self-pay | Admitting: Obstetrics and Gynecology

## 2014-11-22 ENCOUNTER — Other Ambulatory Visit (INDEPENDENT_AMBULATORY_CARE_PROVIDER_SITE_OTHER): Payer: Managed Care, Other (non HMO)

## 2014-11-22 DIAGNOSIS — E559 Vitamin D deficiency, unspecified: Secondary | ICD-10-CM

## 2014-11-22 DIAGNOSIS — I10 Essential (primary) hypertension: Secondary | ICD-10-CM | POA: Diagnosis not present

## 2014-11-22 DIAGNOSIS — E782 Mixed hyperlipidemia: Secondary | ICD-10-CM

## 2014-11-22 LAB — LIPID PANEL
CHOLESTEROL: 137 mg/dL (ref 0–200)
HDL: 35.4 mg/dL — ABNORMAL LOW (ref >39.00)
LDL CALC: 78 mg/dL (ref 0–99)
NonHDL: 101.95
Total CHOL/HDL Ratio: 4
Triglycerides: 122 mg/dL (ref 0.0–149.0)
VLDL: 24.4 mg/dL (ref 0.0–40.0)

## 2014-11-22 LAB — CBC WITH DIFFERENTIAL/PLATELET
Basophils Absolute: 0 10*3/uL (ref 0.0–0.1)
Basophils Relative: 0.5 % (ref 0.0–3.0)
EOS PCT: 2 % (ref 0.0–5.0)
Eosinophils Absolute: 0.2 10*3/uL (ref 0.0–0.7)
HEMATOCRIT: 42.6 % (ref 36.0–46.0)
HEMOGLOBIN: 14.1 g/dL (ref 12.0–15.0)
LYMPHS PCT: 19.7 % (ref 12.0–46.0)
Lymphs Abs: 1.5 10*3/uL (ref 0.7–4.0)
MCHC: 33.1 g/dL (ref 30.0–36.0)
MCV: 84.2 fl (ref 78.0–100.0)
MONOS PCT: 7 % (ref 3.0–12.0)
Monocytes Absolute: 0.5 10*3/uL (ref 0.1–1.0)
Neutro Abs: 5.4 10*3/uL (ref 1.4–7.7)
Neutrophils Relative %: 70.8 % (ref 43.0–77.0)
Platelets: 393 10*3/uL (ref 150.0–400.0)
RBC: 5.06 Mil/uL (ref 3.87–5.11)
RDW: 14.8 % (ref 11.5–15.5)
WBC: 7.6 10*3/uL (ref 4.0–10.5)

## 2014-11-22 LAB — COMPREHENSIVE METABOLIC PANEL
ALT: 28 U/L (ref 0–35)
AST: 21 U/L (ref 0–37)
Albumin: 4.1 g/dL (ref 3.5–5.2)
Alkaline Phosphatase: 78 U/L (ref 39–117)
BUN: 12 mg/dL (ref 6–23)
CO2: 31 mEq/L (ref 19–32)
Calcium: 9.5 mg/dL (ref 8.4–10.5)
Chloride: 103 mEq/L (ref 96–112)
Creatinine, Ser: 0.91 mg/dL (ref 0.40–1.20)
GFR: 68.54 mL/min (ref >60.00)
Glucose, Bld: 122 mg/dL — ABNORMAL HIGH (ref 70–99)
Potassium: 3.7 mEq/L (ref 3.5–5.1)
Sodium: 142 mEq/L (ref 135–145)
Total Bilirubin: 0.6 mg/dL (ref 0.2–1.2)
Total Protein: 7 g/dL (ref 6.0–8.3)

## 2014-11-22 LAB — VITAMIN D 25 HYDROXY (VIT D DEFICIENCY, FRACTURES): VITD: 21.26 ng/mL — ABNORMAL LOW (ref 30.00–100.00)

## 2014-11-22 LAB — TSH: TSH: 1.28 u[IU]/mL (ref 0.35–4.50)

## 2014-11-25 ENCOUNTER — Other Ambulatory Visit: Payer: Self-pay | Admitting: Family Medicine

## 2014-11-25 MED ORDER — ERGOCALCIFEROL 1.25 MG (50000 UT) PO CAPS: 50000.0000 [IU] | ORAL_CAPSULE | ORAL | Status: DC

## 2014-11-29 ENCOUNTER — Ambulatory Visit: Payer: Managed Care, Other (non HMO) | Admitting: Family Medicine

## 2014-12-02 DIAGNOSIS — S82831B Other fracture of upper and lower end of right fibula, initial encounter for open fracture type I or II: Secondary | ICD-10-CM | POA: Insufficient documentation

## 2015-01-30 ENCOUNTER — Telehealth: Payer: Self-pay | Admitting: Obstetrics and Gynecology

## 2015-01-30 NOTE — Telephone Encounter (Signed)
Routing to Dr. Quincy Simmonds for review and advice for planning follow up. Patient will need refills of Aygestin.

## 2015-01-30 NOTE — Telephone Encounter (Signed)
Patient cancelled her appointment for 02/07/2015 with Dr. Quincy Simmonds for a 3 month recheck. She has broken her leg and has had multiple surgeries/office visits. Patient is unable to come in, tried to schedule patient for 02/03/2015, she is unable to come in due to a scheduling conflict. Patient says she will have to call us back to reschedule for some time in the new year. Patient did say that the medication that Dr. Quincy Simmonds put her on is doing fine she hasn't had any spotting or breakthrough bleeding. Best # to reach: [phone] for any questions.

## 2015-01-30 NOTE — Telephone Encounter (Signed)
Ok for Aygestin 5 mg, take 1/2 tablet my mouth daily.  #30, RF one.  Please send to pharmacy of choice. Patient will need to have her mammogram done for further refills if she has not already had it done this year.  I wish her a good recovery!

## 2015-02-04 NOTE — Telephone Encounter (Signed)
Spoke with patient. She is scheduled for follow up with Dr. Quincy Simmonds on 05/02/15 at 3:30. She will call back with any concerns. She states she did not need refill sent at this time.  Routing to provider for final review. Patient agreeable to disposition. Will close encounter.

## 2015-02-04 NOTE — Telephone Encounter (Signed)
Message left to return call to Milo Solana at [phone].    

## 2015-02-06 ENCOUNTER — Telehealth: Payer: Self-pay | Admitting: Family Medicine

## 2015-02-06 NOTE — Telephone Encounter (Signed)
Pt says that she had her flu vac on 10/5 before she was discharged from [facility]

## 2015-02-06 NOTE — Telephone Encounter (Signed)
documented

## 2015-02-07 ENCOUNTER — Ambulatory Visit: Payer: Managed Care, Other (non HMO) | Admitting: Obstetrics and Gynecology

## 2015-02-26 ENCOUNTER — Encounter: Payer: Self-pay | Admitting: Family Medicine

## 2015-02-26 ENCOUNTER — Other Ambulatory Visit: Payer: Self-pay | Admitting: Family Medicine

## 2015-03-25 DIAGNOSIS — K219 Gastro-esophageal reflux disease without esophagitis: Secondary | ICD-10-CM | POA: Insufficient documentation

## 2015-04-02 DIAGNOSIS — M810 Age-related osteoporosis without current pathological fracture: Secondary | ICD-10-CM | POA: Insufficient documentation

## 2015-04-10 LAB — HM DEXA SCAN: HM DEXA SCAN: NORMAL

## 2015-04-14 ENCOUNTER — Encounter: Payer: Self-pay | Admitting: Family Medicine

## 2015-04-24 ENCOUNTER — Ambulatory Visit: Payer: Managed Care, Other (non HMO) | Admitting: Obstetrics and Gynecology

## 2015-04-24 ENCOUNTER — Encounter: Payer: 59 | Admitting: Family Medicine

## 2015-05-02 ENCOUNTER — Ambulatory Visit: Payer: Managed Care, Other (non HMO) | Admitting: Obstetrics and Gynecology

## 2015-05-19 ENCOUNTER — Telehealth: Payer: Self-pay | Admitting: Obstetrics and Gynecology

## 2015-05-19 ENCOUNTER — Encounter: Payer: Self-pay | Admitting: Family Medicine

## 2015-05-19 ENCOUNTER — Ambulatory Visit: Payer: Managed Care, Other (non HMO) | Admitting: Family Medicine

## 2015-05-19 ENCOUNTER — Other Ambulatory Visit: Payer: Self-pay | Admitting: Family Medicine

## 2015-05-19 ENCOUNTER — Ambulatory Visit (INDEPENDENT_AMBULATORY_CARE_PROVIDER_SITE_OTHER): Payer: BLUE CROSS/BLUE SHIELD | Admitting: Family Medicine

## 2015-05-19 DIAGNOSIS — I10 Essential (primary) hypertension: Secondary | ICD-10-CM | POA: Diagnosis not present

## 2015-05-19 DIAGNOSIS — N840 Polyp of corpus uteri: Secondary | ICD-10-CM | POA: Insufficient documentation

## 2015-05-19 DIAGNOSIS — R739 Hyperglycemia, unspecified: Secondary | ICD-10-CM

## 2015-05-19 DIAGNOSIS — E559 Vitamin D deficiency, unspecified: Secondary | ICD-10-CM

## 2015-05-19 DIAGNOSIS — E785 Hyperlipidemia, unspecified: Secondary | ICD-10-CM

## 2015-05-19 DIAGNOSIS — S82401A Unspecified fracture of shaft of right fibula, initial encounter for closed fracture: Secondary | ICD-10-CM | POA: Insufficient documentation

## 2015-05-19 DIAGNOSIS — Z1159 Encounter for screening for other viral diseases: Secondary | ICD-10-CM

## 2015-05-19 HISTORY — DX: Hyperglycemia, unspecified: R73.9

## 2015-05-19 HISTORY — DX: Unspecified fracture of shaft of right fibula, initial encounter for closed fracture: S82.401A

## 2015-05-19 HISTORY — DX: Polyp of corpus uteri: N84.0

## 2015-05-19 LAB — CBC
HCT: 41.6 % (ref 36.0–46.0)
Hemoglobin: 13.9 g/dL (ref 12.0–15.0)
MCHC: 33.4 g/dL (ref 30.0–36.0)
MCV: 83.6 fl (ref 78.0–100.0)
Platelets: 457 10*3/uL — ABNORMAL HIGH (ref 150.0–400.0)
RBC: 4.98 Mil/uL (ref 3.87–5.11)
RDW: 15.4 % (ref 11.5–15.5)
WBC: 7.2 10*3/uL (ref 4.0–10.5)

## 2015-05-19 LAB — LIPID PANEL
CHOLESTEROL: 192 mg/dL (ref 0–200)
HDL: 38.8 mg/dL — ABNORMAL LOW (ref >39.00)
LDL Cholesterol: 131 mg/dL — ABNORMAL HIGH (ref 0–99)
NonHDL: 152.92
Total CHOL/HDL Ratio: 5
Triglycerides: 111 mg/dL (ref 0.0–149.0)
VLDL: 22.2 mg/dL (ref 0.0–40.0)

## 2015-05-19 LAB — COMPREHENSIVE METABOLIC PANEL
ALBUMIN: 4.2 g/dL (ref 3.5–5.2)
ALK PHOS: 82 U/L (ref 39–117)
ALT: 28 U/L (ref 0–35)
AST: 21 U/L (ref 0–37)
BILIRUBIN TOTAL: 0.5 mg/dL (ref 0.2–1.2)
BUN: 16 mg/dL (ref 6–23)
CO2: 30 mEq/L (ref 19–32)
CREATININE: 0.94 mg/dL (ref 0.40–1.20)
Calcium: 9.6 mg/dL (ref 8.4–10.5)
Chloride: 101 mEq/L (ref 96–112)
GFR: 65.91 mL/min (ref >60.00)
Glucose, Bld: 99 mg/dL (ref 70–99)
Potassium: 4 mEq/L (ref 3.5–5.1)
SODIUM: 139 meq/L (ref 135–145)
TOTAL PROTEIN: 7.1 g/dL (ref 6.0–8.3)

## 2015-05-19 LAB — HEPATITIS C ANTIBODY: HCV Ab: NEGATIVE

## 2015-05-19 LAB — HEMOGLOBIN A1C: HEMOGLOBIN A1C: 5.5 % (ref 4.6–6.5)

## 2015-05-19 LAB — VITAMIN D 25 HYDROXY (VIT D DEFICIENCY, FRACTURES): VITD: 22.26 ng/mL — AB (ref 30.00–100.00)

## 2015-05-19 MED ORDER — ERGOCALCIFEROL 1.25 MG (50000 UT) PO CAPS: 50000.0000 [IU] | ORAL_CAPSULE | ORAL | Status: DC

## 2015-05-19 MED ORDER — BUPROPION HCL ER (SR) 150 MG PO TB12: 150.0000 mg | ORAL_TABLET | Freq: Two times a day (BID) | ORAL | Status: DC

## 2015-05-19 MED ORDER — CITALOPRAM HYDROBROMIDE 20 MG PO TABS: 20.0000 mg | ORAL_TABLET | Freq: Every day | ORAL | Status: DC

## 2015-05-19 MED ORDER — AMLODIPINE BESYLATE 5 MG PO TABS: 5.0000 mg | ORAL_TABLET | Freq: Every day | ORAL | Status: DC

## 2015-05-19 MED ORDER — ATORVASTATIN CALCIUM 20 MG PO TABS: 20.0000 mg | ORAL_TABLET | Freq: Every day | ORAL | Status: DC

## 2015-05-19 MED ORDER — OMEPRAZOLE 40 MG PO CPDR: 40.0000 mg | DELAYED_RELEASE_CAPSULE | Freq: Every day | ORAL | Status: DC

## 2015-05-19 MED ORDER — BISOPROLOL-HYDROCHLOROTHIAZIDE 5-6.25 MG PO TABS: 2.0000 | ORAL_TABLET | Freq: Every day | ORAL | Status: DC

## 2015-05-19 NOTE — Progress Notes (Signed)
Subjective:    Patient ID: Alejandra Miller, female    DOB: 1960/04/02, 55 y.o.   MRN: [id number]  Chief Complaint  Patient presents with  . Annual Exam    HPI Patient is in today for Annual Physicial Exam. Patient a recent fall in October 2016 and broke ankle, very slow healing none weight bearing for couple months and started walking in late December, patient just started driving 2 weeks ago, did do some physical therapy.  She currently has a plate and screws in place.  Patient has been using mouth piece and doing well with it snoring has improved. No recent illness or acute concerns. Denies CP/palp/SOB/HA/congestion/fevers/GI or GU c/o. Taking meds as prescribed   Past Medical History  Diagnosis Date  . Depression     counseling in 2010 for 2 months on medication  . GERD (gastroesophageal reflux disease) 2005    treated with omeprazole  . Seasonal allergies     affected in the spring  . Hypertension 2002  . High cholesterol 2002  . History of sinusitis     once/twice per year  . Fibrocystic breast 1988  . Cervical cancer screening 07/20/2012    Menarche at 13, regular til recently No h/o abnormal paps, last pap 2-3 years ago G0P0  MGM today, no h/o abnormal MGMs Spotting now monthly and scant H/o uterine polyps  . Uterine polyp   . Anxiety     NO MEDS  . OSA (obstructive sleep apnea)     +sleep study 08/2010., DOES NOT USE CPAP, USES MOSES MOUTH PIECE  . Osteoarthritis     KNEES, HANDS, HIPS  . Sleep apnea     mouthpiece "[facility]"  . MRSA (methicillin resistant staph aureus) culture positive 2007  . Plantar fasciitis of left foot 03/2014  . Fibroids 2014  . Fracture of right fibula 05/19/2015    October 2016  . Hyperglycemia 05/19/2015  . 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 Tonsillectomy  1969  . Wrist fracture surgery Right 07/2012  . Wisdom tooth extraction  1979  . Dilatation &  currettage/hysteroscopy with resectocope N/A 11/24/2012    Procedure: DILATATION & CURETTAGE/HYSTEROSCOPY WITH RESECTOCOPE;  Surgeon: Arloa Koh, MD;  Location: [facility];  Service: Gynecology;  Laterality: N/A;  . Essure tubal ligation Left   . Dilatation & curettage/hysteroscopy with myosure N/A 07/30/2014    Procedure: DILATATION & CURETTAGE/HYSTEROSCOPY WITH MYOSURE;  Surgeon: Nunzio Cobbs, MD;  Location: [facility];  Service: Gynecology;  Laterality: N/A;  extra time for BMI 49.    Family History  Problem Relation Age of Onset  . Osteoarthritis Mother   . Hyperlipidemia Mother   . Depression Mother   . Liver cancer Maternal Grandmother     mets to breast  . Breast cancer Maternal Grandmother     liver mets to breast  . Cancer Maternal Grandmother     with mets  . Hyperlipidemia Father   . Coronary artery disease Father   . Hypertension Father   . Stroke Father     multiple  . Diabetes Father   . Colon cancer Neg Hx     Social History   Social History  . Marital Status: Married    Spouse Name: N/A  . Number of Children: N/A  . Years of Education: N/A   Occupational History  . Not on file.  Social History Main Topics  . Smoking status: Former Smoker -- 0.50 packs/day for 6 years    Types: Cigarettes    Quit date: 03/01/1989  . Smokeless tobacco: Never Used     Comment: Quit in 1991- started in 1983 1/2ppd  . Alcohol Use: No  . Drug Use: No  . Sexual Activity:    Partners: Male    Birth Control/ Protection: Other-see comments     Comment: vasectomy   Other Topics Concern  . Not on file   Social History Narrative    Outpatient Prescriptions Prior to Visit  Medication Sig Dispense Refill  . Cholecalciferol (VITAMIN D3) 1000 UNITS CAPS Take 1 capsule by mouth daily.      . cyclobenzaprine (FLEXERIL) 10 MG tablet take 1 tablet by mouth daily if needed (Patient taking differently: Take 10 mg by mouth daily as needed for muscle spasms. ) 30 tablet 2  .  ergocalciferol (VITAMIN D2) 50000 UNITS capsule Take 1 capsule (50,000 Units total) by mouth once a week. 4 capsule 3  . Multiple Vitamin (MULTIVITAMIN) tablet Take 1 tablet by mouth daily.      . mupirocin cream (BACTROBAN) 2 % Apply 1 application topically 3 (three) times daily. (Patient taking differently: Apply 1 application topically 3 (three) times daily as needed. ) 30 g 0  . norethindrone (AYGESTIN) 5 MG tablet Take 0.5 tablets (2.5 mg total) by mouth daily. 60 tablet 2  . Omega-3 Fatty Acids (FISH OIL) 1000 MG CAPS Take 1,000 mg by mouth daily.    Marland Kitchen VITAMIN A PO Take 10,000 Units by mouth daily.     . vitamin B-12 (CYANOCOBALAMIN) 1000 MCG tablet Take 1,000 mcg by mouth daily.    Marland Kitchen amLODipine (NORVASC) 5 MG tablet take 1 tablet by mouth once daily 30 tablet 1  . atorvastatin (LIPITOR) 20 MG tablet take 1 tablet by mouth once daily 30 tablet 1  . bisoprolol-hydrochlorothiazide (ZIAC) 5-6.25 MG per tablet take 2 tablets by mouth once daily 60 tablet 6  . buPROPion (WELLBUTRIN SR) 150 MG 12 hr tablet take 1 tablet by mouth twice a day 60 tablet 1  . citalopram (CELEXA) 20 MG tablet take 1 tablet by mouth once daily 30 tablet 6  . omeprazole (PRILOSEC) 40 MG capsule take 1 capsule by mouth once daily 30 capsule 6  . fluticasone (FLONASE) 50 MCG/ACT nasal spray Place 1 spray into both nostrils at bedtime.   0  . ibuprofen (ADVIL,MOTRIN) 800 MG tablet Take 1 tablet (800 mg total) by mouth every 8 (eight) hours as needed. 30 tablet 0   No facility-administered medications prior to visit.    No Known Allergies  Review of Systems  Constitutional: Negative for fever and malaise/fatigue.  HENT: Negative for congestion.   Eyes: Negative for blurred vision.  Respiratory: Negative for shortness of breath.   Cardiovascular: Negative for chest pain, palpitations and leg swelling.  Gastrointestinal: Negative for nausea, abdominal pain and blood in stool.  Genitourinary: Negative for dysuria and  frequency.  Musculoskeletal: Positive for joint pain. Negative for falls.  Skin: Negative for rash.  Neurological: Negative for dizziness, loss of consciousness and headaches.  Endo/Heme/Allergies: Negative for environmental allergies.  Psychiatric/Behavioral: Negative for depression. The patient is not nervous/anxious.        Objective:    Physical Exam  Constitutional: She is oriented to person, place, and time. She appears well-developed and well-nourished. No distress.  HENT:  Head: Normocephalic and atraumatic.  Eyes: Conjunctivae are  normal.  Neck: Neck supple. No thyromegaly present.  Cardiovascular: Normal rate, regular rhythm and normal heart sounds.   No murmur heard. Pulmonary/Chest: Effort normal and breath sounds normal. No respiratory distress.  Abdominal: Soft. Bowel sounds are normal. She exhibits no distension and no mass. There is no tenderness.  Musculoskeletal: She exhibits no edema.  Scarring over lateral and medial malleolus on right c/w surgical repair  Lymphadenopathy:    She has no cervical adenopathy.  Neurological: She is alert and oriented to person, place, and time.  Skin: Skin is warm and dry.  Psychiatric: She has a normal mood and affect. Her behavior is normal.    BP 124/88 mmHg  Pulse 78  Temp(Src) 98.2 F (36.8 C) (Oral)  Ht 5\' 4"  (1.626 m)  Wt 267 lb 4 oz (121.224 kg)  BMI 45.85 kg/m2  SpO2 97% Wt Readings from Last 3 Encounters:  05/19/15 267 lb 4 oz (121.224 kg)  11/08/14 279 lb (126.554 kg)  08/23/14 278 lb (126.1 kg)     Lab Results  Component Value Date   WBC 7.6 11/22/2014   HGB 14.1 11/22/2014   HCT 42.6 11/22/2014   PLT 393.0 11/22/2014   GLUCOSE 122* 11/22/2014   CHOL 137 11/22/2014   TRIG 122.0 11/22/2014   HDL 35.40* 11/22/2014   LDLCALC 78 11/22/2014   ALT 28 11/22/2014   AST 21 11/22/2014   NA 142 11/22/2014   K 3.7 11/22/2014   CL 103 11/22/2014   CREATININE 0.91 11/22/2014   BUN 12 11/22/2014   CO2 31  11/22/2014   TSH 1.28 11/22/2014    Lab Results  Component Value Date   TSH 1.28 11/22/2014   Lab Results  Component Value Date   WBC 7.6 11/22/2014   HGB 14.1 11/22/2014   HCT 42.6 11/22/2014   MCV 84.2 11/22/2014   PLT 393.0 11/22/2014   Lab Results  Component Value Date   NA 142 11/22/2014   K 3.7 11/22/2014   CO2 31 11/22/2014   GLUCOSE 122* 11/22/2014   BUN 12 11/22/2014   CREATININE 0.91 11/22/2014   BILITOT 0.6 11/22/2014   ALKPHOS 78 11/22/2014   AST 21 11/22/2014   ALT 28 11/22/2014   PROT 7.0 11/22/2014   ALBUMIN 4.1 11/22/2014   CALCIUM 9.5 11/22/2014   ANIONGAP 7 07/26/2014   GFR 68.54 11/22/2014   Lab Results  Component Value Date   CHOL 137 11/22/2014   Lab Results  Component Value Date   HDL 35.40* 11/22/2014   Lab Results  Component Value Date   LDLCALC 78 11/22/2014   Lab Results  Component Value Date   TRIG 122.0 11/22/2014   Lab Results  Component Value Date   CHOLHDL 4 11/22/2014   No results found for: HGBA1C     Assessment & Plan:   Problem List Items Addressed This Visit    Hyperglycemia     minimize simple carbs. Increase exercise as tolerated.      Relevant Medications   omeprazole (PRILOSEC) 40 MG capsule   amLODipine (NORVASC) 5 MG tablet   atorvastatin (LIPITOR) 20 MG tablet   bisoprolol-hydrochlorothiazide (ZIAC) 5-6.25 MG tablet   buPROPion (WELLBUTRIN SR) 150 MG 12 hr tablet   citalopram (CELEXA) 20 MG tablet   Other Relevant Orders   VITAMIN D 25 Hydroxy (Vit-D Deficiency, Fractures)   Lipid panel   CBC   Comprehensive metabolic panel   Hemoglobin A1c   Hypertension   Relevant Medications   omeprazole (PRILOSEC)  40 MG capsule   amLODipine (NORVASC) 5 MG tablet   atorvastatin (LIPITOR) 20 MG tablet   bisoprolol-hydrochlorothiazide (ZIAC) 5-6.25 MG tablet   buPROPion (WELLBUTRIN SR) 150 MG 12 hr tablet   citalopram (CELEXA) 20 MG tablet   Other Relevant Orders   VITAMIN D 25 Hydroxy (Vit-D Deficiency,  Fractures)   Lipid panel   CBC   Comprehensive metabolic panel   Hemoglobin A1c   Vitamin D deficiency    Check vitamin D level, continues with 50000 IU q week, and 1000 IU daily      Relevant Medications   omeprazole (PRILOSEC) 40 MG capsule   amLODipine (NORVASC) 5 MG tablet   atorvastatin (LIPITOR) 20 MG tablet   bisoprolol-hydrochlorothiazide (ZIAC) 5-6.25 MG tablet   buPROPion (WELLBUTRIN SR) 150 MG 12 hr tablet   citalopram (CELEXA) 20 MG tablet   Other Relevant Orders   VITAMIN D 25 Hydroxy (Vit-D Deficiency, Fractures)   Lipid panel   CBC   Comprehensive metabolic panel   Hemoglobin A1c    Other Visit Diagnoses    Hyperlipemia        Relevant Medications    omeprazole (PRILOSEC) 40 MG capsule    amLODipine (NORVASC) 5 MG tablet    atorvastatin (LIPITOR) 20 MG tablet    bisoprolol-hydrochlorothiazide (ZIAC) 5-6.25 MG tablet    buPROPion (WELLBUTRIN SR) 150 MG 12 hr tablet    citalopram (CELEXA) 20 MG tablet    Other Relevant Orders    VITAMIN D 25 Hydroxy (Vit-D Deficiency, Fractures)    Lipid panel    CBC    Comprehensive metabolic panel    Hemoglobin A1c    Need for hepatitis C screening test        Relevant Orders    Hepatitis C antibody       I have discontinued Ms. Alling's fluticasone and ibuprofen. I have also changed her omeprazole, amLODipine, atorvastatin, bisoprolol-hydrochlorothiazide, buPROPion, and citalopram. Additionally, I am having her maintain her multivitamin, Vitamin D3, VITAMIN A PO, mupirocin cream, cyclobenzaprine, vitamin B-12, Fish Oil, norethindrone, ergocalciferol, triamcinolone, and Teriparatide (Recombinant).  Meds ordered this encounter  Medications  . triamcinolone (NASACORT) 55 MCG/ACT AERO nasal inhaler    Sig: Place 1 spray into the nose at bedtime.  . Teriparatide, Recombinant, (FORTEO) 600 MCG/2.4ML SOLN    Sig: Inject 20 mcg into the skin daily.  Marland Kitchen omeprazole (PRILOSEC) 40 MG capsule    Sig: Take 1 capsule (40 mg total)  by mouth daily.    Dispense:  30 capsule    Refill:  8  . amLODipine (NORVASC) 5 MG tablet    Sig: Take 1 tablet (5 mg total) by mouth daily.    Dispense:  30 tablet    Refill:  8  . atorvastatin (LIPITOR) 20 MG tablet    Sig: Take 1 tablet (20 mg total) by mouth daily.    Dispense:  30 tablet    Refill:  8  . bisoprolol-hydrochlorothiazide (ZIAC) 5-6.25 MG tablet    Sig: Take 2 tablets by mouth daily.    Dispense:  60 tablet    Refill:  8  . buPROPion (WELLBUTRIN SR) 150 MG 12 hr tablet    Sig: Take 1 tablet (150 mg total) by mouth 2 (two) times daily.    Dispense:  60 tablet    Refill:  8  . citalopram (CELEXA) 20 MG tablet    Sig: Take 1 tablet (20 mg total) by mouth daily.    Dispense:  30  tablet    Refill:  8     Penni Homans, MD

## 2015-05-19 NOTE — Patient Instructions (Signed)
Preventive Care for Adults, Female A healthy lifestyle and preventive care can promote health and wellness. Preventive health guidelines for women include the following key practices.  A routine yearly physical is a good way to check with your health care provider about your health and preventive screening. It is a chance to share any concerns and updates on your health and to receive a thorough exam.  Visit your dentist for a routine exam and preventive care every 6 months. Brush your teeth twice a day and floss once a day. Good oral hygiene prevents tooth decay and gum disease.  The frequency of eye exams is based on your age, health, family medical history, use of contact lenses, and other factors. Follow your health care provider's recommendations for frequency of eye exams.  Eat a healthy diet. Foods like vegetables, fruits, whole grains, low-fat dairy products, and lean protein foods contain the nutrients you need without too many calories. Decrease your intake of foods high in solid fats, added sugars, and salt. Eat the right amount of calories for you.Get information about a proper diet from your health care provider, if necessary.  Regular physical exercise is one of the most important things you can do for your health. Most adults should get at least 150 minutes of moderate-intensity exercise (any activity that increases your heart rate and causes you to sweat) each week. In addition, most adults need muscle-strengthening exercises on 2 or more days a week.  Maintain a healthy weight. The body mass index (BMI) is a screening tool to identify possible weight problems. It provides an estimate of body fat based on height and weight. Your health care provider can find your BMI and can help you achieve or maintain a healthy weight.For adults 20 years and older:  A BMI below 18.5 is considered underweight.  A BMI of 18.5 to 24.9 is normal.  A BMI of 25 to 29.9 is considered overweight.  A  BMI of 30 and above is considered obese.  Maintain normal blood lipids and cholesterol levels by exercising and minimizing your intake of saturated fat. Eat a balanced diet with plenty of fruit and vegetables. Blood tests for lipids and cholesterol should begin at age 45 and be repeated every 5 years. If your lipid or cholesterol levels are high, you are over 50, or you are at high risk for heart disease, you may need your cholesterol levels checked more frequently.Ongoing high lipid and cholesterol levels should be treated with medicines if diet and exercise are not working.  If you smoke, find out from your health care provider how to quit. If you do not use tobacco, do not start.  Lung cancer screening is recommended for adults aged 45-80 years who are at high risk for developing lung cancer because of a history of smoking. A yearly low-dose CT scan of the lungs is recommended for people who have at least a 30-pack-year history of smoking and are a current smoker or have quit within the past 15 years. A pack year of smoking is smoking an average of 1 pack of cigarettes a day for 1 year (for example: 1 pack a day for 30 years or 2 packs a day for 15 years). Yearly screening should continue until the smoker has stopped smoking for at least 15 years. Yearly screening should be stopped for people who develop a health problem that would prevent them from having lung cancer treatment.  If you are pregnant, do not drink alcohol. If you are  breastfeeding, be very cautious about drinking alcohol. If you are not pregnant and choose to drink alcohol, do not have more than 1 drink per day. One drink is considered to be 12 ounces (355 mL) of beer, 5 ounces (148 mL) of wine, or 1.5 ounces (44 mL) of liquor.  Avoid use of street drugs. Do not share needles with anyone. Ask for help if you need support or instructions about stopping the use of drugs.  High blood pressure causes heart disease and increases the risk  of stroke. Your blood pressure should be checked at least every 1 to 2 years. Ongoing high blood pressure should be treated with medicines if weight loss and exercise do not work.  If you are 55-79 years old, ask your health care provider if you should take aspirin to prevent strokes.  Diabetes screening is done by taking a blood sample to check your blood glucose level after you have not eaten for a certain period of time (fasting). If you are not overweight and you do not have risk factors for diabetes, you should be screened once every 3 years starting at age 45. If you are overweight or obese and you are 40-70 years of age, you should be screened for diabetes every year as part of your cardiovascular risk assessment.  Breast cancer screening is essential preventive care for women. You should practice "breast self-awareness." This means understanding the normal appearance and feel of your breasts and may include breast self-examination. Any changes detected, no matter how small, should be reported to a health care provider. Women in their 20s and 30s should have a clinical breast exam (CBE) by a health care provider as part of a regular health exam every 1 to 3 years. After age 40, women should have a CBE every year. Starting at age 40, women should consider having a mammogram (breast X-ray test) every year. Women who have a family history of breast cancer should talk to their health care provider about genetic screening. Women at a high risk of breast cancer should talk to their health care providers about having an MRI and a mammogram every year.  Breast cancer gene (BRCA)-related cancer risk assessment is recommended for women who have family members with BRCA-related cancers. BRCA-related cancers include breast, ovarian, tubal, and peritoneal cancers. Having family members with these cancers may be associated with an increased risk for harmful changes (mutations) in the breast cancer genes BRCA1 and  BRCA2. Results of the assessment will determine the need for genetic counseling and BRCA1 and BRCA2 testing.  Your health care provider may recommend that you be screened regularly for cancer of the pelvic organs (ovaries, uterus, and vagina). This screening involves a pelvic examination, including checking for microscopic changes to the surface of your cervix (Pap test). You may be encouraged to have this screening done every 3 years, beginning at age 21.  For women ages 30-65, health care providers may recommend pelvic exams and Pap testing every 3 years, or they may recommend the Pap and pelvic exam, combined with testing for human papilloma virus (HPV), every 5 years. Some types of HPV increase your risk of cervical cancer. Testing for HPV may also be done on women of any age with unclear Pap test results.  Other health care providers may not recommend any screening for nonpregnant women who are considered low risk for pelvic cancer and who do not have symptoms. Ask your health care provider if a screening pelvic exam is right for   you.  If you have had past treatment for cervical cancer or a condition that could lead to cancer, you need Pap tests and screening for cancer for at least 20 years after your treatment. If Pap tests have been discontinued, your risk factors (such as having a new sexual partner) need to be reassessed to determine if screening should resume. Some women have medical problems that increase the chance of getting cervical cancer. In these cases, your health care provider may recommend more frequent screening and Pap tests.  Colorectal cancer can be detected and often prevented. Most routine colorectal cancer screening begins at the age of 50 years and continues through age 75 years. However, your health care provider may recommend screening at an earlier age if you have risk factors for colon cancer. On a yearly basis, your health care provider may provide home test kits to check  for hidden blood in the stool. Use of a small camera at the end of a tube, to directly examine the colon (sigmoidoscopy or colonoscopy), can detect the earliest forms of colorectal cancer. Talk to your health care provider about this at age 50, when routine screening begins. Direct exam of the colon should be repeated every 5-10 years through age 75 years, unless early forms of precancerous polyps or small growths are found.  People who are at an increased risk for hepatitis B should be screened for this virus. You are considered at high risk for hepatitis B if:  You were born in a country where hepatitis B occurs often. Talk with your health care provider about which countries are considered high risk.  Your parents were born in a high-risk country and you have not received a shot to protect against hepatitis B (hepatitis B vaccine).  You have HIV or AIDS.  You use needles to inject street drugs.  You live with, or have sex with, someone who has hepatitis B.  You get hemodialysis treatment.  You take certain medicines for conditions like cancer, organ transplantation, and autoimmune conditions.  Hepatitis C blood testing is recommended for all people born from 1945 through 1965 and any individual with known risks for hepatitis C.  Practice safe sex. Use condoms and avoid high-risk sexual practices to reduce the spread of sexually transmitted infections (STIs). STIs include gonorrhea, chlamydia, syphilis, trichomonas, herpes, HPV, and human immunodeficiency virus (HIV). Herpes, HIV, and HPV are viral illnesses that have no cure. They can result in disability, cancer, and death.  You should be screened for sexually transmitted illnesses (STIs) including gonorrhea and chlamydia if:  You are sexually active and are younger than 24 years.  You are older than 24 years and your health care provider tells you that you are at risk for this type of infection.  Your sexual activity has changed  since you were last screened and you are at an increased risk for chlamydia or gonorrhea. Ask your health care provider if you are at risk.  If you are at risk of being infected with HIV, it is recommended that you take a prescription medicine daily to prevent HIV infection. This is called preexposure prophylaxis (PrEP). You are considered at risk if:  You are sexually active and do not regularly use condoms or know the HIV status of your partner(s).  You take drugs by injection.  You are sexually active with a partner who has HIV.  Talk with your health care provider about whether you are at high risk of being infected with HIV. If   you choose to begin PrEP, you should first be tested for HIV. You should then be tested every 3 months for as long as you are taking PrEP.  Osteoporosis is a disease in which the bones lose minerals and strength with aging. This can result in serious bone fractures or breaks. The risk of osteoporosis can be identified using a bone density scan. Women ages 67 years and over and women at risk for fractures or osteoporosis should discuss screening with their health care providers. Ask your health care provider whether you should take a calcium supplement or vitamin D to reduce the rate of osteoporosis.  Menopause can be associated with physical symptoms and risks. Hormone replacement therapy is available to decrease symptoms and risks. You should talk to your health care provider about whether hormone replacement therapy is right for you.  Use sunscreen. Apply sunscreen liberally and repeatedly throughout the day. You should seek shade when your shadow is shorter than you. Protect yourself by wearing long sleeves, pants, a wide-brimmed hat, and sunglasses year round, whenever you are outdoors.  Once a month, do a whole body skin exam, using a mirror to look at the skin on your back. Tell your health care provider of new moles, moles that have irregular borders, moles that  are larger than a pencil eraser, or moles that have changed in shape or color.  Stay current with required vaccines (immunizations).  Influenza vaccine. All adults should be immunized every year.  Tetanus, diphtheria, and acellular pertussis (Td, Tdap) vaccine. Pregnant women should receive 1 dose of Tdap vaccine during each pregnancy. The dose should be obtained regardless of the length of time since the last dose. Immunization is preferred during the 27th-36th week of gestation. An adult who has not previously received Tdap or who does not know her vaccine status should receive 1 dose of Tdap. This initial dose should be followed by tetanus and diphtheria toxoids (Td) booster doses every 10 years. Adults with an unknown or incomplete history of completing a 3-dose immunization series with Td-containing vaccines should begin or complete a primary immunization series including a Tdap dose. Adults should receive a Td booster every 10 years.  Varicella vaccine. An adult without evidence of immunity to varicella should receive 2 doses or a second dose if she has previously received 1 dose. Pregnant females who do not have evidence of immunity should receive the first dose after pregnancy. This first dose should be obtained before leaving the health care facility. The second dose should be obtained 4-8 weeks after the first dose.  Human papillomavirus (HPV) vaccine. Females aged 13-26 years who have not received the vaccine previously should obtain the 3-dose series. The vaccine is not recommended for use in pregnant females. However, pregnancy testing is not needed before receiving a dose. If a female is found to be pregnant after receiving a dose, no treatment is needed. In that case, the remaining doses should be delayed until after the pregnancy. Immunization is recommended for any person with an immunocompromised condition through the age of 61 years if she did not get any or all doses earlier. During the  3-dose series, the second dose should be obtained 4-8 weeks after the first dose. The third dose should be obtained 24 weeks after the first dose and 16 weeks after the second dose.  Zoster vaccine. One dose is recommended for adults aged 30 years or older unless certain conditions are present.  Measles, mumps, and rubella (MMR) vaccine. Adults born  before 1957 generally are considered immune to measles and mumps. Adults born in 1957 or later should have 1 or more doses of MMR vaccine unless there is a contraindication to the vaccine or there is laboratory evidence of immunity to each of the three diseases. A routine second dose of MMR vaccine should be obtained at least 28 days after the first dose for students attending postsecondary schools, health care workers, or international travelers. People who received inactivated measles vaccine or an unknown type of measles vaccine during 1963-1967 should receive 2 doses of MMR vaccine. People who received inactivated mumps vaccine or an unknown type of mumps vaccine before 1979 and are at high risk for mumps infection should consider immunization with 2 doses of MMR vaccine. For females of childbearing age, rubella immunity should be determined. If there is no evidence of immunity, females who are not pregnant should be vaccinated. If there is no evidence of immunity, females who are pregnant should delay immunization until after pregnancy. Unvaccinated health care workers born before 1957 who lack laboratory evidence of measles, mumps, or rubella immunity or laboratory confirmation of disease should consider measles and mumps immunization with 2 doses of MMR vaccine or rubella immunization with 1 dose of MMR vaccine.  Pneumococcal 13-valent conjugate (PCV13) vaccine. When indicated, a person who is uncertain of his immunization history and has no record of immunization should receive the PCV13 vaccine. All adults 65 years of age and older should receive this  vaccine. An adult aged 19 years or older who has certain medical conditions and has not been previously immunized should receive 1 dose of PCV13 vaccine. This PCV13 should be followed with a dose of pneumococcal polysaccharide (PPSV23) vaccine. Adults who are at high risk for pneumococcal disease should obtain the PPSV23 vaccine at least 8 weeks after the dose of PCV13 vaccine. Adults older than 54 years of age who have normal immune system function should obtain the PPSV23 vaccine dose at least 1 year after the dose of PCV13 vaccine.  Pneumococcal polysaccharide (PPSV23) vaccine. When PCV13 is also indicated, PCV13 should be obtained first. All adults aged 65 years and older should be immunized. An adult younger than age 65 years who has certain medical conditions should be immunized. Any person who resides in a nursing home or long-term care facility should be immunized. An adult smoker should be immunized. People with an immunocompromised condition and certain other conditions should receive both PCV13 and PPSV23 vaccines. People with human immunodeficiency virus (HIV) infection should be immunized as soon as possible after diagnosis. Immunization during chemotherapy or radiation therapy should be avoided. Routine use of PPSV23 vaccine is not recommended for American Indians, [place] Natives, or people younger than 65 years unless there are medical conditions that require PPSV23 vaccine. When indicated, people who have unknown immunization and have no record of immunization should receive PPSV23 vaccine. One-time revaccination 5 years after the first dose of PPSV23 is recommended for people aged 19-64 years who have chronic kidney failure, nephrotic syndrome, asplenia, or immunocompromised conditions. People who received 1-2 doses of PPSV23 before age 65 years should receive another dose of PPSV23 vaccine at age 65 years or later if at least 5 years have passed since the previous dose. Doses of PPSV23 are not  needed for people immunized with PPSV23 at or after age 65 years.  Meningococcal vaccine. Adults with asplenia or persistent complement component deficiencies should receive 2 doses of quadrivalent meningococcal conjugate (MenACWY-D) vaccine. The doses should be obtained   at least 2 months apart. Microbiologists working with certain meningococcal bacteria, [place] recruits, people at risk during an outbreak, and people who travel to or live in countries with a high rate of meningitis should be immunized. A first-year college student up through age 34 years who is living in a residence hall should receive a dose if she did not receive a dose on or after her 16th birthday. Adults who have certain high-risk conditions should receive one or more doses of vaccine.  Hepatitis A vaccine. Adults who wish to be protected from this disease, have certain high-risk conditions, work with hepatitis A-infected animals, work in hepatitis A research labs, or travel to or work in countries with a high rate of hepatitis A should be immunized. Adults who were previously unvaccinated and who anticipate close contact with an international adoptee during the first 60 days after arrival in the [place] from a country with a high rate of hepatitis A should be immunized.  Hepatitis B vaccine. Adults who wish to be protected from this disease, have certain high-risk conditions, may be exposed to blood or other infectious body fluids, are household contacts or sex partners of hepatitis B positive people, are clients or workers in certain care facilities, or travel to or work in countries with a high rate of hepatitis B should be immunized.  Haemophilus influenzae type b (Hib) vaccine. A previously unvaccinated person with asplenia or sickle cell disease or having a scheduled splenectomy should receive 1 dose of Hib vaccine. Regardless of previous immunization, a recipient of a hematopoietic stem cell transplant should receive a  3-dose series 6-12 months after her successful transplant. Hib vaccine is not recommended for adults with HIV infection. Preventive Services / Frequency Ages 35 to 4 years  Blood pressure check.** / Every 3-5 years.  Lipid and cholesterol check.** / Every 5 years beginning at age 60.  Clinical breast exam.** / Every 3 years for women in their 71s and 10s.  BRCA-related cancer risk assessment.** / For women who have family members with a BRCA-related cancer (breast, ovarian, tubal, or peritoneal cancers).  Pap test.** / Every 2 years from ages 76 through 26. Every 3 years starting at age 61 through age 76 or 93 with a history of 3 consecutive normal Pap tests.  HPV screening.** / Every 3 years from ages 37 through ages 60 to 51 with a history of 3 consecutive normal Pap tests.  Hepatitis C blood test.** / For any individual with known risks for hepatitis C.  Skin self-exam. / Monthly.  Influenza vaccine. / Every year.  Tetanus, diphtheria, and acellular pertussis (Tdap, Td) vaccine.** / Consult your health care provider. Pregnant women should receive 1 dose of Tdap vaccine during each pregnancy. 1 dose of Td every 10 years.  Varicella vaccine.** / Consult your health care provider. Pregnant females who do not have evidence of immunity should receive the first dose after pregnancy.  HPV vaccine. / 3 doses over 6 months, if 93 and younger. The vaccine is not recommended for use in pregnant females. However, pregnancy testing is not needed before receiving a dose.  Measles, mumps, rubella (MMR) vaccine.** / You need at least 1 dose of MMR if you were born in 1957 or later. You may also need a 2nd dose. For females of childbearing age, rubella immunity should be determined. If there is no evidence of immunity, females who are not pregnant should be vaccinated. If there is no evidence of immunity, females who are  pregnant should delay immunization until after pregnancy.  Pneumococcal  13-valent conjugate (PCV13) vaccine.** / Consult your health care provider.  Pneumococcal polysaccharide (PPSV23) vaccine.** / 1 to 2 doses if you smoke cigarettes or if you have certain conditions.  Meningococcal vaccine.** / 1 dose if you are age 68 to 8 years and a Market researcher living in a residence hall, or have one of several medical conditions, you need to get vaccinated against meningococcal disease. You may also need additional booster doses.  Hepatitis A vaccine.** / Consult your health care provider.  Hepatitis B vaccine.** / Consult your health care provider.  Haemophilus influenzae type b (Hib) vaccine.** / Consult your health care provider. Ages 7 to 53 years  Blood pressure check.** / Every year.  Lipid and cholesterol check.** / Every 5 years beginning at age 25 years.  Lung cancer screening. / Every year if you are aged 11-80 years and have a 30-pack-year history of smoking and currently smoke or have quit within the past 15 years. Yearly screening is stopped once you have quit smoking for at least 15 years or develop a health problem that would prevent you from having lung cancer treatment.  Clinical breast exam.** / Every year after age 48 years.  BRCA-related cancer risk assessment.** / For women who have family members with a BRCA-related cancer (breast, ovarian, tubal, or peritoneal cancers).  Mammogram.** / Every year beginning at age 41 years and continuing for as long as you are in good health. Consult with your health care provider.  Pap test.** / Every 3 years starting at age 65 years through age 37 or 70 years with a history of 3 consecutive normal Pap tests.  HPV screening.** / Every 3 years from ages 72 years through ages 60 to 40 years with a history of 3 consecutive normal Pap tests.  Fecal occult blood test (FOBT) of stool. / Every year beginning at age 21 years and continuing until age 5 years. You may not need to do this test if you get  a colonoscopy every 10 years.  Flexible sigmoidoscopy or colonoscopy.** / Every 5 years for a flexible sigmoidoscopy or every 10 years for a colonoscopy beginning at age 35 years and continuing until age 48 years.  Hepatitis C blood test.** / For all people born from [birth year] through 1965 and any individual with known risks for hepatitis C.  Skin self-exam. / Monthly.  Influenza vaccine. / Every year.  Tetanus, diphtheria, and acellular pertussis (Tdap/Td) vaccine.** / Consult your health care provider. Pregnant women should receive 1 dose of Tdap vaccine during each pregnancy. 1 dose of Td every 10 years.  Varicella vaccine.** / Consult your health care provider. Pregnant females who do not have evidence of immunity should receive the first dose after pregnancy.  Zoster vaccine.** / 1 dose for adults aged 30 years or older.  Measles, mumps, rubella (MMR) vaccine.** / You need at least 1 dose of MMR if you were born in 1957 or later. You may also need a second dose. For females of childbearing age, rubella immunity should be determined. If there is no evidence of immunity, females who are not pregnant should be vaccinated. If there is no evidence of immunity, females who are pregnant should delay immunization until after pregnancy.  Pneumococcal 13-valent conjugate (PCV13) vaccine.** / Consult your health care provider.  Pneumococcal polysaccharide (PPSV23) vaccine.** / 1 to 2 doses if you smoke cigarettes or if you have certain conditions.  Meningococcal vaccine.** /  Consult your health care provider.  Hepatitis A vaccine.** / Consult your health care provider.  Hepatitis B vaccine.** / Consult your health care provider.  Haemophilus influenzae type b (Hib) vaccine.** / Consult your health care provider. Ages 64 years and over  Blood pressure check.** / Every year.  Lipid and cholesterol check.** / Every 5 years beginning at age 23 years.  Lung cancer screening. / Every year if you  are aged 16-80 years and have a 30-pack-year history of smoking and currently smoke or have quit within the past 15 years. Yearly screening is stopped once you have quit smoking for at least 15 years or develop a health problem that would prevent you from having lung cancer treatment.  Clinical breast exam.** / Every year after age 74 years.  BRCA-related cancer risk assessment.** / For women who have family members with a BRCA-related cancer (breast, ovarian, tubal, or peritoneal cancers).  Mammogram.** / Every year beginning at age 44 years and continuing for as long as you are in good health. Consult with your health care provider.  Pap test.** / Every 3 years starting at age 58 years through age 22 or 39 years with 3 consecutive normal Pap tests. Testing can be stopped between 65 and 70 years with 3 consecutive normal Pap tests and no abnormal Pap or HPV tests in the past 10 years.  HPV screening.** / Every 3 years from ages 64 years through ages 70 or 61 years with a history of 3 consecutive normal Pap tests. Testing can be stopped between 65 and 70 years with 3 consecutive normal Pap tests and no abnormal Pap or HPV tests in the past 10 years.  Fecal occult blood test (FOBT) of stool. / Every year beginning at age 40 years and continuing until age 27 years. You may not need to do this test if you get a colonoscopy every 10 years.  Flexible sigmoidoscopy or colonoscopy.** / Every 5 years for a flexible sigmoidoscopy or every 10 years for a colonoscopy beginning at age 7 years and continuing until age 32 years.  Hepatitis C blood test.** / For all people born from [birth year] through 1964 and any individual with known risks for hepatitis C.  Osteoporosis screening.** / A one-time screening for women ages 30 years and over and women at risk for fractures or osteoporosis.  Skin self-exam. / Monthly.  Influenza vaccine. / Every year.  Tetanus, diphtheria, and acellular pertussis (Tdap/Td)  vaccine.** / 1 dose of Td every 10 years.  Varicella vaccine.** / Consult your health care provider.  Zoster vaccine.** / 1 dose for adults aged 35 years or older.  Pneumococcal 13-valent conjugate (PCV13) vaccine.** / Consult your health care provider.  Pneumococcal polysaccharide (PPSV23) vaccine.** / 1 dose for all adults aged 46 years and older.  Meningococcal vaccine.** / Consult your health care provider.  Hepatitis A vaccine.** / Consult your health care provider.  Hepatitis B vaccine.** / Consult your health care provider.  Haemophilus influenzae type b (Hib) vaccine.** / Consult your health care provider. ** Family history and personal history of risk and conditions may change your health care provider's recommendations.   This information is not intended to replace advice given to you by your health care provider. Make sure you discuss any questions you have with your health care provider.   Document Released: 04/13/2001 Document Revised: 03/08/2014 Document Reviewed: 07/13/2010 Elsevier Interactive Patient Education [employer].

## 2015-05-19 NOTE — Progress Notes (Signed)
Pre visit review using our clinic review tool, if applicable. No additional management support is needed unless otherwise documented below in the visit note. 

## 2015-05-19 NOTE — Assessment & Plan Note (Signed)
minimize simple carbs. Increase exercise as tolerated.  

## 2015-05-19 NOTE — Assessment & Plan Note (Signed)
Check vitamin D level, continues with 50000 IU q week, and 1000 IU daily

## 2015-05-19 NOTE — Telephone Encounter (Signed)
Spoke with patient. Advised of message as seen below from [place]. She is agreeable and verbalizes understanding. Reports she has enough Aygestin at this time. Will notify the office when she needs a refill. Aware she will need to contact the office with any vaginal bleeding.  Routing to provider for final review. Patient agreeable to disposition. Will close encounter.

## 2015-05-19 NOTE — Assessment & Plan Note (Signed)
Tolerating statin, encouraged heart healthy diet, avoid trans fats, minimize simple carbs and saturated fats. Increase exercise as tolerated 

## 2015-05-19 NOTE — Assessment & Plan Note (Signed)
Managed with low dose Norethindrone by GYN, no break through bleeding since starting this

## 2015-05-19 NOTE — Telephone Encounter (Signed)
Ok to cancel appointment for recheck 05/21/15.  Continue Aygestin 2.5 mg daily.  Please send refills if needs further Rx of this. Call for any vaginal bleeding.  Needs to keep appointment for September 2017 for annual exam.

## 2015-05-19 NOTE — Telephone Encounter (Signed)
Patient has an appointment for "follow up aygestin" with Dr.Silva 05/21/15. Patient would like to cancel this appointment and is asking if that would be okay with Dr.Silva. Patient is okay with a nurse returning her call. FYI: I did not cancel this appointment.

## 2015-05-19 NOTE — Assessment & Plan Note (Signed)
Encouraged DASH diet, decrease po intake and increase exercise as tolerated. Needs 7-8 hours of sleep nightly. Avoid trans fats, eat small, frequent meals every 4-5 hours with lean proteins, complex carbs and healthy fats. Minimize simple carbs 

## 2015-05-19 NOTE — Assessment & Plan Note (Signed)
Uses night guard with good results.

## 2015-05-19 NOTE — Telephone Encounter (Signed)
Patient has an appointment scheduled for 05/21/2015 with Dr.Silva for follow up with taking Aygestin. Reports she is taking Aygestin 2.5 mg daily and has not had any problems. She was switched from Micronor to Aygestin 10/2014 due to BTB.Denies any break through bleeding with taking Aygestin. Requesting to cancel her follow up appointment. Advised I will speak with Dr.Silva regarding cancellation and return call with further recommendations. She is agreeable.

## 2015-05-19 NOTE — Assessment & Plan Note (Addendum)
After compliacted fracture, had external fixation then surgical correction. Has a plate in place and several screws. Same proximal tibia fracture did not require any intervention. She had prolonged healing and had to do rehab. Is now walking with a cane but is healing well now Due to 2 fragility fractures, WFB has diagnosed with osteoporosis and she has been treated with Forteo and is tolerating well.

## 2015-05-21 ENCOUNTER — Ambulatory Visit: Payer: Managed Care, Other (non HMO) | Admitting: Obstetrics and Gynecology

## 2015-08-18 ENCOUNTER — Encounter: Payer: Self-pay | Admitting: Obstetrics and Gynecology

## 2015-08-19 ENCOUNTER — Telehealth: Payer: Self-pay | Admitting: Emergency Medicine

## 2015-08-19 NOTE — Telephone Encounter (Signed)
Dr. Quincy Simmonds,  Please review. Patient currently takes Aygestin 2.5 mg PO daily.

## 2015-08-19 NOTE — Telephone Encounter (Signed)
Telephone encounter created  

## 2015-08-19 NOTE — Telephone Encounter (Signed)
I am sorry that we do not have samples. The Micronor version caused breakthrough bleeding for her.   Patient could stop the medication and have a recheck of her [place] and estradiol levels to see if she can stop the medications.  She has been perimenopausal.

## 2015-08-19 NOTE — Telephone Encounter (Signed)
===  View-only below this line===   ----- Message -----    From: [name]    Sent: 08/18/2015 12:06 PM EDT      To: Arloa Koh, MD Subject: Non-Urgent Medical Question  Dr. Quincy Simmonds,  I currently do not have health insurance. I need to refill my Norethindrone Rx and have discovered that without insurance the cost is just over $100. I wondered if you are able to provide samples or if there is another medication that would work as well but be less expensive. If not I will make it work with the Norethindrone. Thanks Red Christians

## 2015-08-21 NOTE — Telephone Encounter (Signed)
Return call to patient.  She is given message from Dr. Quincy Simmonds and verbalizes understanding. She will continue to take her medication as prescribed and stop taking the Aygestin on 10/31/15 and then will have her annual exam with Dr. Quincy Simmonds on 11/21/15 and then can plan to have recheck of [facility] and estradiol level after she has been off of Aygestin for 3 weeks.    She is advised also to call and price check prescriptions at local pharmacies as they may offer lower costs than current pharmacy or prescription savings cards. She is agreeable.  She will follow up as scheduled and advised to call back with any concerns.  Routing to provider for final review. Patient agreeable to disposition. Will close encounter.

## 2015-11-20 ENCOUNTER — Ambulatory Visit: Payer: BLUE CROSS/BLUE SHIELD | Admitting: Family Medicine

## 2015-11-21 ENCOUNTER — Ambulatory Visit (INDEPENDENT_AMBULATORY_CARE_PROVIDER_SITE_OTHER): Payer: Managed Care, Other (non HMO) | Admitting: Obstetrics and Gynecology

## 2015-11-21 ENCOUNTER — Encounter: Payer: Self-pay | Admitting: Obstetrics and Gynecology

## 2015-11-21 VITALS — BP 124/82 | HR 74 | Resp 16 | Ht 63.75 in | Wt 270.0 lb

## 2015-11-21 DIAGNOSIS — Z01419 Encounter for gynecological examination (general) (routine) without abnormal findings: Secondary | ICD-10-CM | POA: Diagnosis not present

## 2015-11-21 DIAGNOSIS — N898 Other specified noninflammatory disorders of vagina: Secondary | ICD-10-CM

## 2015-11-21 DIAGNOSIS — N939 Abnormal uterine and vaginal bleeding, unspecified: Secondary | ICD-10-CM

## 2015-11-21 NOTE — Progress Notes (Signed)
55 y.o. G0P0 Married Caucasian female here for annual exam.    Patient has a hx of perimenopausal/abnormal uterine bleeding.  Has had hysteroscopy with dilation and curettage twice with pathology showing benign polyp on first surgery and inactive endometrium on second surgery.  Was on Micronor and had irregular spotting.  Switched to Aygestin 2.5 mg po q day.  Takes a 5 mg tablet and splits in half. The only time she had bleeding on Aygestin was spotting while she was in rehab from her recent ankle fracture recovery.  October 2017.  She missed dosages, which caused the spotting. Stopped taking them in August 2017.  No bleeding since.  Last hormone check 04/05/14:  [place] and broke ankle in October 2016.  Had 2 surgeries.  This has really limited her ability to drive and do exercise.  Now is back to being independent again.  Going to the fracture Clinic at [facility] for "osteoporosis." Taking Forteo daily.  Increasing calcium and vit D.  Stopped all of her other medications and doing herbal tx.   Went to a weight loss clinic.  Received an Rx for phentermine and did not tolerate it well. Increased blood pressure.  Stopped it. Went to the [facility] [employer] instructional clinic.  PCP:   No PCP.  Patient's last menstrual period was 10/03/2014 (exact date).       Sexually active: Yes.    The current method of family planning is vasectomy.    Exercising: Yes.    some walking Smoker:  no  Health Maintenance: Pap:  10-12-13 neg HPV HR neg History of abnormal Pap:  no MMG:  12-31-13 category b density birads 1:neg.  [facility].   Colonoscopy:  10-22-13 neg due 8/25 BMD:   04-10-15  Result  Normal, hx of osteoporosis per patient TDaP:  2014 HIV: 2006 neg Hep C: neg 2017 Screening Labs:  Hb today: none, Urine today: pt declined   reports that she quit smoking about 26 years ago. Her smoking use included Cigarettes. She has a 3.00 pack-year smoking history. She  has never used smokeless tobacco. She reports that she does not drink alcohol or use drugs.  Past Medical History:  Diagnosis Date  . Anxiety    NO MEDS  . Cervical cancer screening 07/20/2012   Menarche at 13, regular til recently No h/o abnormal paps, last pap 2-3 years ago G0P0  MGM today, no h/o abnormal MGMs Spotting now monthly and scant H/o uterine polyps  . Depression    counseling in 2010 for 2 months on medication  . Fibrocystic breast 1988  . Fibroids 2014  . Fracture of right fibula 05/19/2015   October 2016  . GERD (gastroesophageal reflux disease) 2005   treated with omeprazole  . High cholesterol 2002  . History of sinusitis    once/twice per year  . Hyperglycemia 05/19/2015  . Hypertension 2002  . MRSA (methicillin resistant staph aureus) culture positive 2007  . OSA (obstructive sleep apnea)    +sleep study 08/2010., DOES NOT USE CPAP, USES MOSES MOUTH PIECE  . Osteoarthritis    KNEES, HANDS, HIPS  . Osteoporosis   . Plantar fasciitis of left foot 03/2014  . Seasonal allergies    affected in the spring  . Sleep apnea    mouthpiece "[place]"  . Uterine polyp   . 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  DILATATION & CURETTAGE/HYSTEROSCOPY WITH MYOSURE N/A 07/30/2014   Procedure: DILATATION & CURETTAGE/HYSTEROSCOPY WITH MYOSURE;  Surgeon: Nunzio Cobbs, MD;  Location: [facility];  Service: Gynecology;  Laterality: N/A;  extra time for BMI 49.  Marland Kitchen DILATATION & CURRETTAGE/HYSTEROSCOPY WITH RESECTOCOPE N/A 11/24/2012   Procedure: DILATATION & CURETTAGE/HYSTEROSCOPY WITH RESECTOCOPE;  Surgeon: Arloa Koh, MD;  Location: [facility];  Service: Gynecology;  Laterality: N/A;  . ESSURE TUBAL LIGATION Left   . TONSILLECTOMY  1969  . WISDOM TOOTH EXTRACTION  1979  . WRIST FRACTURE SURGERY Right 07/2012    Current Outpatient Prescriptions  Medication Sig Dispense Refill  . CALCIUM PO Take by  mouth daily.    . Cholecalciferol (VITAMIN D PO) Take 1,000 mg by mouth daily.    . Coenzyme Q10 (CO Q 10 PO) Take by mouth daily.    Marland Kitchen GARLIC PO Take 123456 mg by mouth.    Marland Kitchen MAGNESIUM PO Take 500 mg by mouth.    . Multiple Vitamin (MULTIVITAMIN) tablet Take 1 tablet by mouth daily.      . Omega-3 Fatty Acids (FISH OIL) 1000 MG CAPS Take 1,000 mg by mouth daily.    Marland Kitchen omeprazole (PRILOSEC) 20 MG capsule Take 20 mg by mouth daily.    Marland Kitchen POTASSIUM PO Take 99 mg by mouth.    . Probiotic Product (PROBIOTIC PO) Take by mouth.    . [place] JOH[place] WORT PO Take by mouth daily.    . Teriparatide, Recombinant, (FORTEO) 600 MCG/2.4ML SOLN Inject 20 mcg into the skin daily.    Marland Kitchen UNABLE TO FIND 450 mg. Med Name: Damiana leaves     No current facility-administered medications for this visit.     Family History  Problem Relation Age of Onset  . Osteoarthritis Mother   . Hyperlipidemia Mother   . Depression Mother   . Liver cancer Maternal Grandmother     mets to breast  . Breast cancer Maternal Grandmother     liver mets to breast  . Cancer Maternal Grandmother     with mets  . Hyperlipidemia Father   . Coronary artery disease Father   . Hypertension Father   . Stroke Father     multiple  . Diabetes Father   . Colon cancer Neg Hx     ROS:  Pertinent items are noted in HPI.  Otherwise, a comprehensive ROS was negative.  Exam:   BP 124/82   Pulse 74   Resp 16   Ht 5' 3.75" (1.619 m)   Wt 270 lb (122.5 kg)   LMP 10/03/2014 (Exact Date) Comment: spotting 10/16  BMI 46.71 kg/m     General appearance: alert, cooperative and appears stated age Head: Normocephalic, without obvious abnormality, atraumatic Neck: no adenopathy, supple, symmetrical, trachea midline and thyroid normal to inspection and palpation Lungs: clear to auscultation bilaterally Breasts: normal appearance, no masses or tenderness, No nipple retraction or dimpling, No nipple discharge or bleeding, No axillary or supraclavicular  adenopathy Heart: regular rate and rhythm Abdomen: soft, non-tender; no masses, no organomegaly Extremities: extremities normal, atraumatic, no cyanosis or edema Skin: Skin color, texture, turgor normal. No rashes or lesions Lymph nodes: Cervical, supraclavicular, and axillary nodes normal. No abnormal inguinal nodes palpated Neurologic: Grossly normal  Pelvic: External genitalia:  no lesions              Urethra:  normal appearing urethra with no masses, tenderness or lesions  Bartholins and Skenes: normal                 Vagina: normal appearing vagina with normal color and discharge, no lesions              Cervix: no lesions              Pap taken: No. Bimanual Exam:  Uterus:  normal size, contour, position, consistency, mobility, non-tender              Adnexa: no mass, fullness, tenderness              Rectal exam: Yes.  .  Confirms.              Anus:  normal sphincter tone, no lesions  Chaperone was present for exam.  Assessment:   Well woman visit with normal exam. Fragility fractures.  Now on Forteo.  Hx perimenopausal abnormal uterine bleeding.  Off Aygestin.  Obesity.   Plan: Yearly mammogram recommended.  Patient will call to schedule. Recommended self breast exam.  Pap and HR HPV as above. Discussed Calcium, Vitamin D, regular exercise program including cardiovascular and weight bearing exercise. Check FSH and estradiol and give guidance regarding Aygestin continuation.  Establish care with PCP.  I gave patient list and she may consider [facility].   We discussed weight loss through medical means and surgical means.  Follow up annually and prn.      After visit summary provided.

## 2015-11-21 NOTE — Patient Instructions (Signed)

## 2015-11-22 LAB — ESTRADIOL: ESTRADIOL: 27 pg/mL

## 2015-11-22 LAB — FOLLICLE STIMULATING HORMONE: FSH: 42.6 m[IU]/mL

## 2015-11-23 MED ORDER — NORETHINDRONE ACETATE 5 MG PO TABS: 2.5000 mg | ORAL_TABLET | Freq: Every day | ORAL | 3 refills | Status: DC

## 2015-11-23 NOTE — Addendum Note (Signed)
Addended by: Yisroel Ramming, Dietrich Pates E on: 11/23/2015 06:51 PM   Modules accepted: Orders

## 2015-12-03 ENCOUNTER — Telehealth: Payer: Self-pay | Admitting: Obstetrics and Gynecology

## 2015-12-03 ENCOUNTER — Other Ambulatory Visit: Payer: Self-pay | Admitting: Obstetrics and Gynecology

## 2015-12-03 MED ORDER — NORETHINDRONE ACETATE 5 MG PO TABS: ORAL_TABLET | ORAL | 2 refills | Status: DC

## 2015-12-03 NOTE — Telephone Encounter (Signed)
Left message to return call to [place] at [phone].

## 2015-12-03 NOTE — Progress Notes (Signed)
Patient will stop [place].

## 2015-12-03 NOTE — Telephone Encounter (Signed)
Patient called and has some questions because the medication Dr Quincy Simmonds started her on has "given her a full blown period"

## 2015-12-03 NOTE — Telephone Encounter (Signed)
Please have the patient increase her Aygestin to 2.5 mg po bid.   Please make a follow up visit with me for 3 months.  Keep bleeding calendar.

## 2015-12-03 NOTE — Telephone Encounter (Signed)
Left message to call Kaitlyn at [phone]. 

## 2015-12-03 NOTE — Telephone Encounter (Signed)
Spoke with patient. Patient states she was in for AEX 11/21/15. Patients reports restarting Aygestin 2.5mg  daily at hs on 11/24/15. Patient states she now has started a "full blown period". Patient reports bleeding started 12/01/15. Patient reports changing pad q3-4hrs and 2 "smaller than a dime" clots. Patient states she has been taking the medication the same time everyday and has has had spotting in the past with the Aygestin, but no full cycle. Advised I would review with Dr. Quincy Simmonds for recommendations and return call. Patient is agreeable.   Dr. Quincy Simmonds, please advise.

## 2015-12-04 NOTE — Telephone Encounter (Signed)
Spoke with patient. Advised of Dr. Elza Rafter recommendations as seen below. Patient verbalizes understanding and is agreeable. Patient scheduled for 3 month follow-up 03/04/16 at 1:00pm. Patient is agreeable to date and time.   Routing to provider for final review. Patient is agreeable to disposition. Will close encounter.

## 2015-12-05 ENCOUNTER — Other Ambulatory Visit: Payer: Self-pay | Admitting: Obstetrics and Gynecology

## 2015-12-05 DIAGNOSIS — Z1231 Encounter for screening mammogram for malignant neoplasm of breast: Secondary | ICD-10-CM

## 2015-12-08 ENCOUNTER — Other Ambulatory Visit: Payer: Self-pay

## 2015-12-08 MED ORDER — NORETHINDRONE ACETATE 5 MG PO TABS: ORAL_TABLET | ORAL | 2 refills | Status: DC

## 2015-12-08 NOTE — Telephone Encounter (Signed)
Medication refill request: AYGESTIN 5mg  Last AEX:  11/21/15 BS Next AEX: 12/03/16 Last MMG (if hormonal medication request): Scheduled for MMG 12/19/15 - 12-31-13 category b density birads 1:neg.  [facility] Refill authorized: 12/03/15 #30 w/2 refills; [facility] needs a 90-day supply instead

## 2015-12-19 ENCOUNTER — Ambulatory Visit (HOSPITAL_BASED_OUTPATIENT_CLINIC_OR_DEPARTMENT_OTHER): Payer: Self-pay

## 2015-12-24 ENCOUNTER — Ambulatory Visit (INDEPENDENT_AMBULATORY_CARE_PROVIDER_SITE_OTHER): Payer: Managed Care, Other (non HMO) | Admitting: Medical

## 2015-12-24 ENCOUNTER — Encounter: Payer: Self-pay | Admitting: Medical

## 2015-12-24 VITALS — BP 120/80 | HR 87 | Temp 98.4°F | Ht 63.0 in | Wt 275.8 lb

## 2015-12-24 DIAGNOSIS — J0100 Acute maxillary sinusitis, unspecified: Secondary | ICD-10-CM | POA: Diagnosis not present

## 2015-12-24 MED ORDER — HYDROCODONE-HOMATROPINE 5-1.5 MG/5ML PO SYRP: 5.0000 mL | ORAL_SOLUTION | Freq: Three times a day (TID) | ORAL | 0 refills | Status: DC | PRN

## 2015-12-24 MED ORDER — DOXYCYCLINE HYCLATE 100 MG PO TABS: 100.0000 mg | ORAL_TABLET | Freq: Two times a day (BID) | ORAL | 0 refills | Status: DC

## 2015-12-24 MED ORDER — FLUTICASONE PROPIONATE 50 MCG/ACT NA SUSP: 2.0000 | Freq: Every day | NASAL | 2 refills | Status: DC

## 2015-12-24 NOTE — Progress Notes (Signed)
Subjective:    Patient ID: Alejandra Miller, female    DOB: August 06, 1960, 55 y.o.   MRN: [id number]  HPI  Pt states on Friday got some sinus pressure and nasal congestion. Over last 2 days she is starting to feel chest congestion with productive.  Pt had no fevers, no chills, or sweats. Pt feels tired.  No wheezing.  Pt has had some sneezing.   Pt does have history of sinus infection and bronchitis in the past. One event of walking pneumonia 14 years ago. She is concerned that will get sick rapidly and worsen.   Pt has been using mucinex and hydrating well.        Review of Systems  Constitutional: Positive for fatigue. Negative for chills and fever.  HENT: Positive for congestion, sinus pressure and sneezing.   Respiratory: Positive for cough. Negative for chest tightness, shortness of breath and wheezing.        Moderate cough. Keepig her up at night.  Cardiovascular: Negative for chest pain and palpitations.  Gastrointestinal: Negative for anal bleeding.  Musculoskeletal: Negative for back pain, myalgias, neck pain and neck stiffness.  Skin: Negative for rash.  Neurological: Negative for dizziness and headaches.  Hematological: Negative for adenopathy. Does not bruise/bleed easily.  Psychiatric/Behavioral: Negative for behavioral problems and confusion.   Past Medical History:  Diagnosis Date  . Anxiety    NO MEDS  . Cervical cancer screening 07/20/2012   Menarche at 13, regular til recently No h/o abnormal paps, last pap 2-3 years ago G0P0  MGM today, no h/o abnormal MGMs Spotting now monthly and scant H/o uterine polyps  . Depression    counseling in 2010 for 2 months on medication  . Fibrocystic breast 1988  . Fibroids 2014  . Fracture of right fibula 05/19/2015   October 2016  . GERD (gastroesophageal reflux disease) 2005   treated with omeprazole  . High cholesterol 2002  . History of sinusitis    once/twice per year  . Hyperglycemia 05/19/2015  . Hypertension  2002  . MRSA (methicillin resistant staph aureus) culture positive 2007  . OSA (obstructive sleep apnea)    +sleep study 08/2010., DOES NOT USE CPAP, USES MOSES MOUTH PIECE  . Osteoarthritis    KNEES, HANDS, HIPS  . Osteoporosis   . Plantar fasciitis of left foot 03/2014  . Seasonal allergies    affected in the spring  . Sleep apnea    mouthpiece "Moses"  . Uterine polyp   . 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 Marital status: Married    Spouse name: N/A  . Number of children: N/A  . Years of education: N/A   Occupational History  . Not on file.   Social History Main Topics  . Smoking status: Former Smoker    Packs/day: 0.50    Years: 6.00    Types: Cigarettes    Quit date: 03/01/1989  . Smokeless tobacco: Never Used     Comment: Quit in 1991- started in 1983 1/2ppd  . Alcohol use No  . Drug use: No  . Sexual activity: Yes    Partners: Male    Birth control/ protection: Other-see comments     Comment: vasectomy   Other Topics Concern  . Not on file   Social History Narrative  . No narrative on file    Past Surgical History:  Procedure Laterality Date  .  DILATATION & CURETTAGE/HYSTEROSCOPY WITH MYOSURE N/A 07/30/2014   Procedure: DILATATION & CURETTAGE/HYSTEROSCOPY WITH MYOSURE;  Surgeon: Nunzio Cobbs, MD;  Location: [facility];  Service: Gynecology;  Laterality: N/A;  extra time for BMI 49.  Marland Kitchen DILATATION & CURRETTAGE/HYSTEROSCOPY WITH RESECTOCOPE N/A 11/24/2012   Procedure: DILATATION & CURETTAGE/HYSTEROSCOPY WITH RESECTOCOPE;  Surgeon: Arloa Koh, MD;  Location: [facility];  Service: Gynecology;  Laterality: N/A;  . ESSURE TUBAL LIGATION Left   . TONSILLECTOMY  1969  . WISDOM TOOTH EXTRACTION  1979  . WRIST FRACTURE SURGERY Right 07/2012    Family History  Problem Relation Age of Onset  . Osteoarthritis Mother   . Hyperlipidemia Mother   . Depression Mother   . Liver cancer Maternal  Grandmother     mets to breast  . Breast cancer Maternal Grandmother     liver mets to breast  . Cancer Maternal Grandmother     with mets  . Hyperlipidemia Father   . Coronary artery disease Father   . Hypertension Father   . Stroke Father     multiple  . Diabetes Father   . Colon cancer Neg Hx     No Known Allergies  Current Outpatient Prescriptions on File Prior to Visit  Medication Sig Dispense Refill  . CALCIUM PO Take by mouth daily.    . Cholecalciferol (VITAMIN D PO) Take 1,000 mg by mouth daily.    . Coenzyme Q10 (CO Q 10 PO) Take by mouth daily.    Marland Kitchen GARLIC PO Take 123456 mg by mouth.    Marland Kitchen MAGNESIUM PO Take 500 mg by mouth.    . Multiple Vitamin (MULTIVITAMIN) tablet Take 1 tablet by mouth daily.      . norethindrone (AYGESTIN) 5 MG tablet Take 1/2 tablet (2.5 mg) by mouth twice daily. 90 tablet 2  . Omega-3 Fatty Acids (FISH OIL) 1000 MG CAPS Take 1,000 mg by mouth daily.    Marland Kitchen omeprazole (PRILOSEC) 20 MG capsule Take 20 mg by mouth daily.    Marland Kitchen POTASSIUM PO Take 99 mg by mouth.    . Probiotic Product (PROBIOTIC PO) Take by mouth.    . Teriparatide, Recombinant, (FORTEO) 600 MCG/2.4ML SOLN Inject 20 mcg into the skin daily.    Marland Kitchen UNABLE TO FIND 450 mg. Med Name: Damiana leaves     No current facility-administered medications on file prior to visit.     BP 120/80   Pulse 87   Temp 98.4 F (36.9 C) (Oral)   Ht 5\' 3"  (1.6 m)   Wt 275 lb 12.8 oz (125.1 kg)   SpO2 97%   BMI 48.86 kg/m       Objective:   Physical Exam   General  Mental Status - Alert. General Appearance - Well groomed. Not in acute distress.  Skin Rashes- No Rashes.  HEENT Head- Normal. Ear Auditory Canal - Left- Normal. Right - Normal.Tympanic Membrane- Left- Normal. Right- Normal. Eye Sclera/Conjunctiva- Left- Normal. Right- Normal. Nose & Sinuses Nasal Mucosa- Left-  Boggy and Congested. Right-  Boggy and  Congested.Bilateral maxillary and frontal sinus pressure/pain on  palpation. Mouth & Throat Lips: Upper Lip- Normal: no dryness, cracking, pallor, cyanosis, or vesicular eruption. Lower Lip-Normal: no dryness, cracking, pallor, cyanosis or vesicular eruption. Buccal Mucosa- Bilateral- No Aphthous ulcers. Oropharynx- No Discharge or Erythema. Tonsils: Characteristics- Bilateral- No Erythema or Congestion. Size/Enlargement- Bilateral- No enlargement. Discharge- bilateral-None.  Neck Neck- Supple. No Masses.   Chest and Lung Exam Auscultation: Breath Sounds:-Clear  even and unlabored.  Cardiovascular Auscultation:Rythm- Regular, rate and rhythm. Murmurs & Other Heart Sounds:Ausculatation of the heart reveal- No Murmurs.  Lymphatic Head & Neck General Head & Neck Lymphatics: Bilateral: Description- No Localized lymphadenopathy.      Assessment & Plan:  You  appear to have a sinus infection and possible bronchitis. I am prescribing doxycycline antibiotic for the infection. To help with the nasal congestion I prescribed flonase nasal steroid. For your associated cough, I prescribed cough medicine hycodan  Rest, hydrate, tylenol for fever.  If chest congestion type symptoms worsen then will get cxr.  Follow up in 7 days or as needed.   Chauntel Windsor, Percell Miller, PA-C

## 2015-12-24 NOTE — Patient Instructions (Addendum)
You appear to have a sinus infection and possible bronchitis. I am prescribing doxycycline antibiotic for the infection. To help with the nasal congestion I prescribed  flonase nasal steroid. For your associated cough, I prescribed cough medicine hycodan.  Rest, hydrate, tylenol for fever.  If chest congestion type symptoms worsen then will get cxr.  When feeling better next week call and schedule flu vaccine nurse visit.   Follow up in 7 days or as needed.

## 2015-12-24 NOTE — Progress Notes (Signed)
Pre visit review using our clinic review tool, if applicable. No additional management support is needed unless otherwise documented below in the visit note. 

## 2015-12-25 ENCOUNTER — Ambulatory Visit (HOSPITAL_BASED_OUTPATIENT_CLINIC_OR_DEPARTMENT_OTHER): Payer: Self-pay

## 2015-12-26 ENCOUNTER — Ambulatory Visit (HOSPITAL_BASED_OUTPATIENT_CLINIC_OR_DEPARTMENT_OTHER): Payer: Self-pay

## 2016-01-01 ENCOUNTER — Encounter: Payer: Self-pay | Admitting: Obstetrics and Gynecology

## 2016-01-01 ENCOUNTER — Telehealth: Payer: Self-pay | Admitting: *Deleted

## 2016-01-01 ENCOUNTER — Ambulatory Visit: Payer: Self-pay | Admitting: Obstetrics and Gynecology

## 2016-01-01 NOTE — Telephone Encounter (Signed)
Spoke with patient. Patient scheduled for 01/02/16 at 1:00pm with Dr. Quincy Simmonds. Patient agreeable to date and time.  Routing to provider for final review. Patient is agreeable to disposition. Will close encounter.

## 2016-01-01 NOTE — Telephone Encounter (Signed)
Please offer 1:00 pm tomorrow with me.

## 2016-01-01 NOTE — Telephone Encounter (Signed)
Spoke with Alejandra Miller in reference to MyChart message as seen below. Advised Alejandra Miller Dr. Quincy Simmonds would like to see Alejandra Miller in office 01/02/16 at 0930. Alejandra Miller states she has a 0920 Screening MMG scheduled at [facility]. Advised Alejandra Miller I would let Dr. Quincy Simmonds know and return call to Alejandra Miller. Alejandra Miller is agreeable.  Dr. Quincy Simmonds, recommendations?         From [name] To Nunzio Cobbs, MD Sent 01/01/2016 12:01 PM  Hi Dr. Quincy Simmonds,  Please see the attached calendar regarding my period.  I increased the dose as directed, of Aygestin 5mg  1/2 tab daily to twice daily on 10/5.  As you can see I only had 3 days during October of no bleeding at all and on those days I still wear protection because I don't know what may occur. This almost daily bleeding is getting to be a real pain and I find myself to be very easily irrated lately, I'm pretty sure it's related to my period. Can we do something different or do I need to give this dose more time for adjustment?  Please let me know what you think. My next scheduled appointment with you is on Jan 4th 2018.  Thanks [employer]

## 2016-01-02 ENCOUNTER — Ambulatory Visit (INDEPENDENT_AMBULATORY_CARE_PROVIDER_SITE_OTHER): Payer: Managed Care, Other (non HMO) | Admitting: Obstetrics and Gynecology

## 2016-01-02 ENCOUNTER — Ambulatory Visit (HOSPITAL_BASED_OUTPATIENT_CLINIC_OR_DEPARTMENT_OTHER)
Admission: RE | Admit: 2016-01-02 | Discharge: 2016-01-02 | Disposition: A | Discharge status: Home / Self Care | Payer: Managed Care, Other (non HMO) | Source: Ambulatory Visit | Attending: Obstetrics and Gynecology | Admitting: Obstetrics and Gynecology

## 2016-01-02 ENCOUNTER — Encounter: Payer: Self-pay | Admitting: Obstetrics and Gynecology

## 2016-01-02 VITALS — BP 128/80 | HR 88 | Ht 63.75 in

## 2016-01-02 DIAGNOSIS — N921 Excessive and frequent menstruation with irregular cycle: Secondary | ICD-10-CM

## 2016-01-02 DIAGNOSIS — Z1231 Encounter for screening mammogram for malignant neoplasm of breast: Secondary | ICD-10-CM | POA: Diagnosis present

## 2016-01-02 LAB — CBC
HEMATOCRIT: 41.4 % (ref 35.0–45.0)
HEMOGLOBIN: 13.8 g/dL (ref 11.7–15.5)
MCH: 28 pg (ref 27.0–33.0)
MCHC: 33.3 g/dL (ref 32.0–36.0)
MCV: 84.1 fL (ref 80.0–100.0)
MPV: 10 fL (ref 7.5–12.5)
Platelets: 446 10*3/uL — ABNORMAL HIGH (ref 140–400)
RBC: 4.92 MIL/uL (ref 3.80–5.10)
RDW: 14.6 % (ref 11.0–15.0)
WBC: 9.8 10*3/uL (ref 3.8–10.8)

## 2016-01-02 NOTE — Progress Notes (Signed)
GYNECOLOGY  VISIT   HPI: 55 y.o.   Married  Caucasian  female   G0P0 with Patient's last menstrual period was 12/01/2015 (exact date).   here for abnormal uterine bleeding. Patient states has bled almost everyday in October. Has been taking Ayestin 5 mg, 1/2 tab bid since 12-04-15 and is easily irritated.  Had hormone testing done after being off the Aygestin for one month.  E2 27 and FSH 42.6 on 11/21/15.  Bleeding since October 2.  Today the bleeding is light.  At times changes super tampon and changing every 3 hours with clots.  No significant pain.  No dizziness or light headedness.   Had a Mirena IUD in the past and it expelled.   She has had hysteroscopy in 2014 an 2016 for abnormal uterine bleeding and had benign endometrial polyp and benign inactive endometrium as the surgical path diagnoses, respectively.   She has had tx with Micronor for endometrial protection due her BMI and the recurrent bleeding issues, and she had breakthrough bleeding.  She then went on Aygestin 2.5 mg daily and had amenorrhea for some period of time.  Eventually her bleeding has increased and she went up to 2.5 mg po bid.  Now this is no longer controlling the bleeding.   GYNECOLOGIC HISTORY: Patient's last menstrual period was 12/01/2015 (exact date). Contraception:  Vasectomy Last mammogram:  12-31-13 Density B/Neg/BiRads1:[facility] Last pap smear: 10-12-13 Neg:Neg HR HPV         OB History    Gravida Para Term Preterm AB Living   0             SAB TAB Ectopic Multiple Live Births                     Patient Active Problem List   Diagnosis Date Noted  . Fracture of right fibula 05/19/2015  . Hyperglycemia 05/19/2015  . Uterine polyp 05/19/2015  . Bursitis of shoulder 11/05/2013  . Cervical cancer screening 07/20/2012  . Obesity 02/12/2012  . Leg swelling 12/08/2011  . Annual physical exam 07/28/2011  . Menstrual irregularity 12/15/2010  . Vitamin D deficiency 12/15/2010  . OSA  (obstructive sleep apnea) 08/11/2010  . Arthralgia 07/28/2010  . Hyperlipidemia 07/28/2010  . Hypertension 07/28/2010  . Depression 07/28/2010    Past Medical History:  Diagnosis Date  . Anxiety    NO MEDS  . Cervical cancer screening 07/20/2012   Menarche at 13, regular til recently No h/o abnormal paps, last pap 2-3 years ago G0P0  MGM today, no h/o abnormal MGMs Spotting now monthly and scant H/o uterine polyps  . Depression    counseling in 2010 for 2 months on medication  . Fibrocystic breast 1988  . Fibroids 2014  . Fracture of right fibula 05/19/2015   October 2016  . GERD (gastroesophageal reflux disease) 2005   treated with omeprazole  . High cholesterol 2002  . History of sinusitis    once/twice per year  . Hyperglycemia 05/19/2015  . Hypertension 2002  . MRSA (methicillin resistant staph aureus) culture positive 2007  . OSA (obstructive sleep apnea)    +sleep study 08/2010., DOES NOT USE CPAP, USES MOSES MOUTH PIECE  . Osteoarthritis    KNEES, HANDS, HIPS  . Osteoporosis   . Plantar fasciitis of left foot 03/2014  . Seasonal allergies    affected in the spring  . Sleep apnea    mouthpiece "Moses"  . Uterine polyp   . Uterine  polyp 05/19/2015   Recurrent Managed by Dr Rolly Pancake    Past Surgical History:  Procedure Laterality Date  . DILATATION & CURETTAGE/HYSTEROSCOPY WITH MYOSURE N/A 07/30/2014   Procedure: DILATATION & CURETTAGE/HYSTEROSCOPY WITH MYOSURE;  Surgeon: Nunzio Cobbs, MD;  Location: [facility];  Service: Gynecology;  Laterality: N/A;  extra time for BMI 49.  Marland Kitchen DILATATION & CURRETTAGE/HYSTEROSCOPY WITH RESECTOCOPE N/A 11/24/2012   Procedure: DILATATION & CURETTAGE/HYSTEROSCOPY WITH RESECTOCOPE;  Surgeon: Arloa Koh, MD;  Location: [facility];  Service: Gynecology;  Laterality: N/A;  . ESSURE TUBAL LIGATION Left   . TONSILLECTOMY  1969  . WISDOM TOOTH EXTRACTION  1979  . WRIST FRACTURE SURGERY Right 07/2012    Current Outpatient  Prescriptions  Medication Sig Dispense Refill  . CALCIUM PO Take by mouth daily.    . Cholecalciferol (VITAMIN D PO) Take 1,000 mg by mouth daily.    . Coenzyme Q10 (CO Q 10 PO) Take by mouth daily.    Marland Kitchen doxycycline (VIBRA-TABS) 100 MG tablet Take 1 tablet (100 mg total) by mouth 2 (two) times daily. 20 tablet 0  . fluticasone (FLONASE) 50 MCG/ACT nasal spray Place 2 sprays into both nostrils daily. 16 g 2  . MAGNESIUM PO Take 500 mg by mouth.    . Multiple Vitamin (MULTIVITAMIN) tablet Take 1 tablet by mouth daily.      . norethindrone (AYGESTIN) 5 MG tablet Take 1/2 tablet (2.5 mg) by mouth twice daily. 90 tablet 2  . Omega-3 Fatty Acids (FISH OIL) 1000 MG CAPS Take 1,000 mg by mouth daily.    Marland Kitchen omeprazole (PRILOSEC) 20 MG capsule Take 20 mg by mouth daily.    Marland Kitchen POTASSIUM PO Take 99 mg by mouth.    . Probiotic Product (PROBIOTIC PO) Take by mouth.    . Teriparatide, Recombinant, 600 MCG/2.4ML SOLN Inject 20 mcg into the skin daily.    Marland Kitchen UNABLE TO FIND 450 mg. Med Name: Damiana leaves     No current facility-administered medications for this visit.      ALLERGIES: Review of patient's allergies indicates no known allergies.  Family History  Problem Relation Age of Onset  . Osteoarthritis Mother   . Hyperlipidemia Mother   . Depression Mother   . Liver cancer Maternal Grandmother     mets to breast  . Breast cancer Maternal Grandmother     liver mets to breast  . Cancer Maternal Grandmother     with mets  . Hyperlipidemia Father   . Coronary artery disease Father   . Hypertension Father   . Stroke Father     multiple  . Diabetes Father   . Colon cancer Neg Hx     Social History   Social History  . Marital status: Married    Spouse name: N/A  . Number of children: N/A  . Years of education: N/A   Occupational History  . Not on file.   Social History Main Topics  . Smoking status: Former Smoker    Packs/day: 0.50    Years: 6.00    Types: Cigarettes    Quit date:  03/01/1989  . Smokeless tobacco: Never Used     Comment: Quit in 1991- started in 1983 1/2ppd  . Alcohol use No  . Drug use: No  . Sexual activity: Yes    Partners: Male    Birth control/ protection: Other-see comments     Comment: vasectomy   Other Topics Concern  . Not on file  Social History Narrative  . No narrative on file    ROS:  Pertinent items are noted in HPI.  PHYSICAL EXAMINATION:    BP 128/80 (BP Location: Right Arm, Patient Position: Sitting, Cuff Size: Large)   Pulse 88   Ht 5' 3.75" (1.619 m)   LMP 12/01/2015 (Exact Date)     General appearance: alert, cooperative and appears stated age   Pelvic: External genitalia:  no lesions              Urethra:  normal appearing urethra with no masses, tenderness or lesions              Bartholins and Skenes: normal                 Vagina: normal appearing vagina with normal color and discharge, no lesions              Cervix: no lesions.  Minimal bleeding from the os, not active.                Bimanual Exam:  Uterus:  normal size, contour, position, consistency, mobility, non-tender              Adnexa: no mass, fullness, tenderness      Chaperone was present for exam.  ASSESSMENT  Abnormal uterine bleeding on Aygestin 2.5 mg po bid. Perimenopausal female. Status post hysteroscopy x 2 with benign pathology.  PLAN  Check CBC today.  Increase Aygestin to 5 mg po bid.  She has enough.  If bleeding does not stop, she will call and then we will do another EMB.  Discussed potential Mirena IUD with US guidance versus hysterectomy.  She will consider these options.  Follow up in January and sooner as needed.   An After Visit Summary was printed and given to the patient.  __15___ minutes face to face time of which over 50% was spent in counseling.

## 2016-02-01 ENCOUNTER — Encounter: Payer: Self-pay | Admitting: Obstetrics and Gynecology

## 2016-02-02 ENCOUNTER — Telehealth: Payer: Self-pay

## 2016-02-02 MED ORDER — NORETHINDRONE ACETATE 5 MG PO TABS: ORAL_TABLET | ORAL | 0 refills | Status: DC

## 2016-02-02 NOTE — Telephone Encounter (Signed)
Spoke with patient. Advised of message as seen below from [place]. Patient is agreeable and verbalizes understanding. Rx for Aygestin 5 mg po BID #180 0RF sent to [facility]. Follow up appointment moved to 04/03/2015 at 9:30 am with Dr.Silva. Patient is agreeable to date and time.  Routing to provider for final review. Patient agreeable to disposition. Will close encounter.

## 2016-02-02 NOTE — Telephone Encounter (Signed)
Routed to [place] for review and advise.

## 2016-02-02 NOTE — Telephone Encounter (Signed)
Ok to continue with Aygestin 5 mg po bid.  Please have her follow up with me in February.  This will give [name] a little longer to see how her progress with this dosage.  Call back for bleeding.

## 2016-02-02 NOTE — Telephone Encounter (Signed)
Non-Urgent Medical Question  Message [id number]  From [place], MD Sent 02/01/2016 10:16 AM  Hi Dr. Quincy Simmonds,  Just want to let you know that since I've started taking the Aygestin 5mg - 1 tab twice a day, I've had no bleeding at all. It seems the dosage increase has taken care of the issue,(for now at least). I currently have enough Aygestin to last until December 20th. Can your office go ahead and contact [facility] with the new prescription of 5mg  twice a day?   Also, I have an appointment scheduled with you on January 4th 2018, do you think I still need to be seen?   Thanks for everything, Alejandra Miller   Responsible Party   Pool - [facility] Clinical Pool No one has taken responsibility for this message.  No actions have been taken on this message.   Routing to [place] for review and advise. Was advised to increase dosage of Aygestin to 5 mg po bid on 01/02/2016 at which time the patient had enough of the medication. Okay to refill? Would you like to see the patient for follow up in 03/2016?

## 2016-02-27 ENCOUNTER — Other Ambulatory Visit: Payer: Self-pay | Admitting: Obstetrics and Gynecology

## 2016-02-27 ENCOUNTER — Telehealth: Payer: Self-pay | Admitting: Obstetrics and Gynecology

## 2016-02-27 DIAGNOSIS — N939 Abnormal uterine and vaginal bleeding, unspecified: Secondary | ICD-10-CM

## 2016-02-27 NOTE — Telephone Encounter (Signed)
Left message to call Kaitlyn at [phone]. 

## 2016-02-27 NOTE — Telephone Encounter (Signed)
Patient has started bleeding again and would like to discuss moving forward with Mirena insertion.

## 2016-02-27 NOTE — Telephone Encounter (Signed)
Spoke with patient. Advised of message as seen below from [place]. Patient is agreeable and verbalizes understanding. Reports she has enough Aygestin to take TID until appointment next week. Appointment for EMB and ultrasound guided IUD insertion scheduled for 03/04/2016 at 1 pm. Patient is agreeable to date and time. Orders placed for EMB, PUS, and IUD insertion by Dr.Silva. Pre procedure instructions given.  Motrin instructions given. Motrin=Advil=Ibuprofen, 800 mg one hour before appointment. Eat a meal and hydrate well before appointment. Cytotec instructions given. Place 2 tablets vaginally 6-12 hours prior to procedure. Rx for Cytotec 200 mcg #2 0RF sent to pharmacy on file.  Routing to provider for final review. Patient agreeable to disposition. Will close encounter.

## 2016-02-27 NOTE — Telephone Encounter (Signed)
My recommendations are increase the Aygestin to 5 mg po tid, return for office endometrial biopsy and ultrasound guided Mirena IUD.  I will send orders to be precerted.

## 2016-02-27 NOTE — Telephone Encounter (Signed)
Spoke with patient. Patient states that she has been taking Aygestin 5 mg BID to help control irregular bleeding. Two weeks ago she began having light spotting for two days. Then bleeding increased to the flow of a normal menses. Bleeding has fluctuated between spotting and increased flow. Denies heavy bleeding. Has not missed any Aygestin doses. Patient is interested in proceeding with IUD insertion at this time. Advised patient I will review recommendations with Dr.Silva and return call regarding appointment scheduling. patinet is agreeable.

## 2016-03-03 ENCOUNTER — Telehealth: Payer: Self-pay | Admitting: Obstetrics and Gynecology

## 2016-03-03 MED ORDER — MISOPROSTOL 200 MCG PO TABS: ORAL_TABLET | ORAL | 0 refills | Status: DC

## 2016-03-03 NOTE — Telephone Encounter (Signed)
Patient is calling to check the status of a medication she is suppose to have before her procedure tomorrow. She is using [facility] at [address] in [place].

## 2016-03-03 NOTE — Telephone Encounter (Signed)
Patient is G0P0. Patient is scheduled for EMB, and ultrasound guided IUD insertion tomorrow 03/04/2016. Rx for Cytotec 200 mcg #2 0RF Place 2 tablets vaginally 6-12 hours prior to procedure sent to pharmacy on file.  Left detailed message at number provided [phone], okay per ROI. Advised rx has been sent in to the pharmacy for pick up today. Advised to return call with any further questions.  Routing to provider for final review. Patient agreeable to disposition. Will close encounter.

## 2016-03-04 ENCOUNTER — Encounter: Payer: Self-pay | Admitting: Family Medicine

## 2016-03-04 ENCOUNTER — Ambulatory Visit: Payer: Self-pay | Admitting: Obstetrics and Gynecology

## 2016-03-04 ENCOUNTER — Ambulatory Visit (INDEPENDENT_AMBULATORY_CARE_PROVIDER_SITE_OTHER): Payer: Managed Care, Other (non HMO)

## 2016-03-04 ENCOUNTER — Ambulatory Visit (INDEPENDENT_AMBULATORY_CARE_PROVIDER_SITE_OTHER): Payer: Managed Care, Other (non HMO) | Admitting: Obstetrics and Gynecology

## 2016-03-04 VITALS — BP 132/78 | HR 68 | Resp 16 | Ht 63.75 in

## 2016-03-04 DIAGNOSIS — N939 Abnormal uterine and vaginal bleeding, unspecified: Secondary | ICD-10-CM

## 2016-03-04 DIAGNOSIS — D259 Leiomyoma of uterus, unspecified: Secondary | ICD-10-CM

## 2016-03-04 MED ORDER — IBUPROFEN 800 MG PO TABS: 800.0000 mg | ORAL_TABLET | Freq: Three times a day (TID) | ORAL | 1 refills | Status: DC | PRN

## 2016-03-04 NOTE — Progress Notes (Signed)
Encounter reviewed by Dr. Brook Amundson C. Silva.  

## 2016-03-04 NOTE — Progress Notes (Signed)
Patient ID: Alejandra Miller, female   DOB: [birth year], 56 y.o.   MRN: [id number] GYNECOLOGY  VISIT   HPI: 56 y.o.   Married  Caucasian  female   G0P0 with No LMP recorded. Patient is not currently having periods (Reason: Other).   here for pelvic ultrasound for abnormal uterine bleeding and endometrial biopsy.   Mirena IUD placement also planned.  Patient has had perimenopausal abnormal uterine bleeding.   Has had 2 hysteroscopies with dilation and curettage. Sept 2104 and May 2016. Has benign polyps. No hyperplasia.  Bleeding has not been well controlled on Aygestin.   Last FSH 42.6 and estradiol 27, which is up from 1 year prior when it was 21.9.  Had a unilateral Essure placed.  Used Mirena IUD in the past, and it was expelled.   GYNECOLOGIC HISTORY: No LMP recorded. Patient is not currently having periods (Reason: Other). Contraception:  Vasectomy,  Essure in left tube. Last mammogram:  01-02-16 Density B/Neg/BiRads1:[facility] Last pap smear:  10-12-13 Neg:Neg HR HPV          OB History    Gravida Para Term Preterm AB Living   0             SAB TAB Ectopic Multiple Live Births                     Patient Active Problem List   Diagnosis Date Noted  . Fracture of right fibula 05/19/2015  . Hyperglycemia 05/19/2015  . Uterine polyp 05/19/2015  . Bursitis of shoulder 11/05/2013  . Cervical cancer screening 07/20/2012  . Obesity 02/12/2012  . Leg swelling 12/08/2011  . Annual physical exam 07/28/2011  . Menstrual irregularity 12/15/2010  . Vitamin D deficiency 12/15/2010  . OSA (obstructive sleep apnea) 08/11/2010  . Arthralgia 07/28/2010  . Hyperlipidemia 07/28/2010  . Hypertension 07/28/2010  . Depression 07/28/2010    Past Medical History:  Diagnosis Date  . Anxiety    NO MEDS  . Cervical cancer screening 07/20/2012   Menarche at 13, regular til recently No h/o abnormal paps, last pap 2-3 years ago G0P0  MGM today, no h/o abnormal MGMs Spotting now  monthly and scant H/o uterine polyps  . Depression    counseling in 2010 for 2 months on medication  . Fibrocystic breast 1988  . Fibroids 2014  . Fracture of right fibula 05/19/2015   October 2016  . GERD (gastroesophageal reflux disease) 2005   treated with omeprazole  . High cholesterol 2002  . History of sinusitis    once/twice per year  . Hyperglycemia 05/19/2015  . Hypertension 2002  . MRSA (methicillin resistant staph aureus) culture positive 2007  . OSA (obstructive sleep apnea)    +sleep study 08/2010., DOES NOT USE CPAP, USES MOSES MOUTH PIECE  . Osteoarthritis    KNEES, HANDS, HIPS  . Osteoporosis   . Plantar fasciitis of left foot 03/2014  . Seasonal allergies    affected in the spring  . Sleep apnea    mouthpiece "[facility]"  . Uterine polyp   . 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 DILATATION & CURETTAGE/HYSTEROSCOPY WITH MYOSURE N/A 07/30/2014   Procedure: DILATATION & CURETTAGE/HYSTEROSCOPY WITH MYOSURE;  Surgeon: Nunzio Cobbs, MD;  Location: [facility];  Service: Gynecology;  Laterality: N/A;  extra time for BMI 49.  Marland Kitchen DILATATION & CURRETTAGE/HYSTEROSCOPY WITH RESECTOCOPE  N/A 11/24/2012   Procedure: DILATATION & CURETTAGE/HYSTEROSCOPY WITH RESECTOCOPE;  Surgeon: Arloa Koh, MD;  Location: [facility];  Service: Gynecology;  Laterality: N/A;  . ESSURE TUBAL LIGATION Left   . TONSILLECTOMY  1969  . WISDOM TOOTH EXTRACTION  1979  . WRIST FRACTURE SURGERY Right 07/2012    Current Outpatient Prescriptions  Medication Sig Dispense Refill  . CALCIUM PO Take by mouth daily.    . Cholecalciferol (VITAMIN D PO) Take 1,000 mg by mouth daily.    . Coenzyme Q10 (CO Q 10 PO) Take by mouth daily.    Marland Kitchen doxycycline (VIBRA-TABS) 100 MG tablet Take 1 tablet (100 mg total) by mouth 2 (two) times daily. 20 tablet 0  . fluticasone (FLONASE) 50 MCG/ACT nasal spray Place 2 sprays into both nostrils  daily. 16 g 2  . ibuprofen (ADVIL,MOTRIN) 800 MG tablet Take 1 tablet (800 mg total) by mouth every 8 (eight) hours as needed. 30 tablet 1  . MAGNESIUM PO Take 500 mg by mouth.    . misoprostol (CYTOTEC) 200 MCG tablet Place 2 tablets vaginally 6-12 hours prior to procedure. 2 tablet 0  . Multiple Vitamin (MULTIVITAMIN) tablet Take 1 tablet by mouth daily.      . norethindrone (AYGESTIN) 5 MG tablet Take 5 mg BID. 180 tablet 0  . Omega-3 Fatty Acids (FISH OIL) 1000 MG CAPS Take 1,000 mg by mouth daily.    Marland Kitchen omeprazole (PRILOSEC) 20 MG capsule Take 20 mg by mouth daily.    Marland Kitchen POTASSIUM PO Take 99 mg by mouth.    . Probiotic Product (PROBIOTIC PO) Take by mouth.    . Teriparatide, Recombinant, 600 MCG/2.4ML SOLN Inject 20 mcg into the skin daily.    Marland Kitchen UNABLE TO FIND 450 mg. Med Name: Damiana leaves     No current facility-administered medications for this visit.      ALLERGIES: Patient has no known allergies.  Family History  Problem Relation Age of Onset  . Osteoarthritis Mother   . Hyperlipidemia Mother   . Depression Mother   . Liver cancer Maternal Grandmother     mets to breast  . Breast cancer Maternal Grandmother     liver mets to breast  . Cancer Maternal Grandmother     with mets  . Hyperlipidemia Father   . Coronary artery disease Father   . Hypertension Father   . Stroke Father     multiple  . Diabetes Father   . Colon cancer Neg Hx     Social History   Social History  . Marital status: Married    Spouse name: N/A  . Number of children: N/A  . Years of education: N/A   Occupational History  . Not on file.   Social History Main Topics  . Smoking status: Former Smoker    Packs/day: 0.50    Years: 6.00    Types: Cigarettes    Quit date: 03/01/1989  . Smokeless tobacco: Never Used     Comment: Quit in 1991- started in 1983 1/2ppd  . Alcohol use No  . Drug use: No  . Sexual activity: Yes    Partners: Male    Birth control/ protection: Other-see comments      Comment: vasectomy   Other Topics Concern  . Not on file   Social History Narrative  . No narrative on file    ROS:  Pertinent items are noted in HPI.  PHYSICAL EXAMINATION:    BP 132/78 (BP Location: Right  Arm, Patient Position: Sitting, Cuff Size: Normal)   Pulse 68   Resp 16   Ht 5' 3.75" (1.619 m)     General appearance: alert, cooperative and appears stated age   Pelvic ultrasound: 4 small fibroids about 1 cm each.  Myometrium and cystic spaces.  Possible adenomyosis. EMS 6.06 mm  Right ovary with dominant follicle. No free fluid.  Endometrial biopsy and Mirena IUD placement. Consent for procedures.  Speculum placed in vagina.  Hibiclens to cervix.  Tenaculum to ant. cervical lip.   Paracervical block with 10 cc 1% lidocaine. Lot [id number], exp 02/21 Uterus sounded to 8 cm.  Pipelle passed twice and tissue to pathology. Mirena IUD placed under ultrasound guidance.  Lot number [id number], expiration 06/20. Strings trimmed.  No complications.  Minimal EBL. Post IUD ultrasound showed IUD properly in endometrial canal. Mirena IUD card given to patient with date for removal. Chaperone was present for exam.  ASSESSMENT   Perimenopausal abnormal uterine bleeding.  Possible adenomyosis. Small fibroids.   PLAN  Review of ultrasound findings including fibroids and potential adenomyosis. Follow up EMB.  Motrin 800 mg now and Rx for Motrin as well to pharmacy.  Bleeding profile of Mirena discussed. Can stop the Aygestin.  Return or IUD recheck in about 4 weeks, sooner as needed.  An After Visit Summary was printed and given to the patient.

## 2016-03-04 NOTE — Patient Instructions (Signed)
Endometrial Biopsy, Care After Refer to this sheet in the next few weeks. These instructions provide you with information on caring for yourself after your procedure. Your health care provider may also give you more specific instructions. Your treatment has been planned according to current medical practices, but problems sometimes occur. Call your health care provider if you have any problems or questions after your procedure. WHAT TO EXPECT AFTER THE PROCEDURE After your procedure, it is typical to have the following:  You may have mild cramping and a small amount of vaginal bleeding for a few days after the procedure. This is normal. HOME CARE INSTRUCTIONS  Only take over-the-counter or prescription medicine as directed by your health care provider.  Do not douche, use tampons, or have sexual intercourse until your health care provider approves.  Follow your health care provider's instructions regarding any activity restrictions, such as strenuous exercise or heavy lifting. SEEK MEDICAL CARE IF:  You have heavy bleeding or bleeding longer than 2 days after the procedure.  You have bad smelling drainage from your vagina.  You have a fever and chills.  Youhave severe lower stomach (abdominal) pain. SEEK IMMEDIATE MEDICAL CARE IF:  You have severe cramps in your stomach or back.  You pass large blood clots.  Your bleeding increases.  You become weak or lightheaded, or you pass out. This information is not intended to replace advice given to you by your health care provider. Make sure you discuss any questions you have with your health care provider. Document Released: 12/06/2012 Document Reviewed: 12/06/2012 Elsevier Interactive Patient Education  2017 [place].  Intrauterine Device Insertion, Care After Refer to this sheet in the next few weeks. These instructions provide you with information on caring for yourself after your procedure. Your health care provider may also  give you more specific instructions. Your treatment has been planned according to current medical practices, but problems sometimes occur. Call your health care provider if you have any problems or questions after your procedure. WHAT TO EXPECT AFTER THE PROCEDURE Insertion of the IUD may cause some discomfort, such as cramping. The cramping should improve after the IUD is in place. You may have bleeding after the procedure. This is normal. It varies from light spotting for a few days to menstrual-like bleeding. When the IUD is in place, a string will extend past the cervix into the vagina for 1-2 inches. The strings should not bother you or your partner. If they do, talk to your health care provider.  HOME CARE INSTRUCTIONS   Check your intrauterine device (IUD) to make sure it is in place before you resume sexual activity. You should be able to feel the strings. If you cannot feel the strings, something may be wrong. The IUD may have fallen out of the uterus, or the uterus may have been punctured (perforated) during placement. Also, if the strings are getting longer, it may mean that the IUD is being forced out of the uterus. You no longer have full protection from pregnancy if any of these problems occur.  You may resume sexual intercourse if you are not having problems with the IUD. The copper IUD is considered immediately effective, and the hormone IUD works right away if inserted within 7 days of your period starting. You will need to use a backup method of birth control for 7 days if the IUD in inserted at any other time in your cycle.  Continue to check that the IUD is still in place by  feeling for the strings after every menstrual period.  You may need to take pain medicine such as acetaminophen or ibuprofen. Only take medicines as directed by your health care provider. SEEK MEDICAL CARE IF:   You have bleeding that is heavier or lasts longer than a normal menstrual cycle.  You have a  fever.  You have increasing cramps or abdominal pain not relieved with medicine.  You have abdominal pain that does not seem to be related to the same area of earlier cramping and pain.  You are lightheaded, unusually weak, or faint.  You have abnormal vaginal discharge or smells.  You have pain during sexual intercourse.  You cannot feel the IUD strings, or the IUD string has gotten longer.  You feel the IUD at the opening of the cervix in the vagina.  You think you are pregnant, or you miss your menstrual period.  The IUD string is hurting your sex partner. MAKE SURE YOU:  Understand these instructions.  Will watch your condition.  Will get help right away if you are not doing well or get worse. This information is not intended to replace advice given to you by your health care provider. Make sure you discuss any questions you have with your health care provider. Document Released: 10/14/2010 Document Revised: 12/06/2012 Document Reviewed: 08/06/2012 Elsevier Interactive Patient Education  2017 [employer].

## 2016-03-05 ENCOUNTER — Encounter: Payer: Self-pay | Admitting: Obstetrics and Gynecology

## 2016-03-08 LAB — IPS OTHER TISSUE BIOPSY

## 2016-03-14 ENCOUNTER — Encounter: Payer: Self-pay | Admitting: Obstetrics and Gynecology

## 2016-04-01 ENCOUNTER — Encounter: Payer: Self-pay | Admitting: Obstetrics and Gynecology

## 2016-04-01 ENCOUNTER — Ambulatory Visit (INDEPENDENT_AMBULATORY_CARE_PROVIDER_SITE_OTHER): Payer: Managed Care, Other (non HMO) | Admitting: Obstetrics and Gynecology

## 2016-04-01 VITALS — BP 128/80 | HR 84 | Resp 20 | Ht 63.75 in | Wt 259.0 lb

## 2016-04-01 DIAGNOSIS — Z30431 Encounter for routine checking of intrauterine contraceptive device: Secondary | ICD-10-CM

## 2016-04-01 NOTE — Progress Notes (Signed)
GYNECOLOGY  VISIT   HPI: 56 y.o.   Married  Caucasian  female   G0P0 with No LMP recorded. Patient is not currently having periods (Reason: IUD).   here for 4 week recheck of IUD.     Had Mirena IUD placed on 03/04/16 under ultrasound guidance.  Had EMB on the same day which was negative.  It showed inactive endometrium and decidualized stroma consistent with progesterone effect.  She had bleeding after stopping the Aygestin. Aygestin was discontinued when the IUD was placed. Bleeding stopped on Jan 20.  Has had spotted twice since then.   Occasional cramping.  Not sexually active since IUD was placed.   Last 19 pounds on her new diet!  Seeing a new PCP soon.   GYNECOLOGIC HISTORY: No LMP recorded. Patient is not currently having periods (Reason: IUD). Contraception:  Mirena Menopausal hormone therapy:  none Last mammogram:  01/02/16 BIRADS1, D[facility]  Last pap smear:  10/12/13 neg HR HPV neg        OB History    Gravida Para Term Preterm AB Living   0             SAB TAB Ectopic Multiple Live Births                     Patient Active Problem List   Diagnosis Date Noted  . Fracture of right fibula 05/19/2015  . Hyperglycemia 05/19/2015  . Uterine polyp 05/19/2015  . Bursitis of shoulder 11/05/2013  . Cervical cancer screening 07/20/2012  . Obesity 02/12/2012  . Leg swelling 12/08/2011  . Annual physical exam 07/28/2011  . Menstrual irregularity 12/15/2010  . Vitamin D deficiency 12/15/2010  . OSA (obstructive sleep apnea) 08/11/2010  . Arthralgia 07/28/2010  . Hyperlipidemia 07/28/2010  . Hypertension 07/28/2010  . Depression 07/28/2010    Past Medical History:  Diagnosis Date  . Anxiety    NO MEDS  . Cervical cancer screening 07/20/2012   Menarche at 13, regular til recently No h/o abnormal paps, last pap 2-3 years ago G0P0  MGM today, no h/o abnormal MGMs Spotting now monthly and scant H/o uterine polyps  . Depression    counseling in 2010 for 2 months on medication  . Fibrocystic breast 1988  . Fibroids 2014  . Fracture of right fibula 05/19/2015   October 2016  . GERD (gastroesophageal reflux disease) 2005   treated with omeprazole  . High cholesterol 2002  . History of sinusitis    once/twice per year  . Hyperglycemia 05/19/2015  . Hypertension 2002  . MRSA (methicillin resistant staph aureus) culture positive 2007  . OSA (obstructive sleep apnea)    +sleep study 08/2010., DOES NOT USE CPAP, USES MOSES MOUTH PIECE  . Osteoarthritis    KNEES, HANDS, HIPS  . Osteoporosis   . Plantar fasciitis of left foot 03/2014  . Seasonal allergies    affected in the spring  . Sleep apnea    mouthpiece "[facility]"  . Uterine polyp   . 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 DILATATION & CURETTAGE/HYSTEROSCOPY WITH MYOSURE N/A 07/30/2014   Procedure: DILATATION & CURETTAGE/HYSTEROSCOPY WITH MYOSURE;  Surgeon: Nunzio Cobbs, MD;  Location: [facility];  Service: Gynecology;  Laterality: N/A;  extra time for BMI 49.  Marland Kitchen DILATATION & CURRETTAGE/HYSTEROSCOPY WITH RESECTOCOPE N/A 11/24/2012   Procedure: DILATATION & CURETTAGE/HYSTEROSCOPY WITH  RESECTOCOPE;  Surgeon: Arloa Koh, MD;  Location: [facility];  Service: Gynecology;  Laterality: N/A;  . ESSURE TUBAL LIGATION Left   . TONSILLECTOMY  1969  . WISDOM TOOTH EXTRACTION  1979  . WRIST FRACTURE SURGERY Right 07/2012    Current Outpatient Prescriptions  Medication Sig Dispense Refill  . CALCIUM PO Take by mouth daily.    . Cholecalciferol (VITAMIN D PO) Take 1,000 mg by mouth daily.    . Coenzyme Q10 (CO Q 10 PO) Take by mouth daily.    Marland Kitchen levonorgestrel (MIRENA) 20 MCG/24HR IUD 1 each by Intrauterine route once.    Marland Kitchen MAGNESIUM PO Take 500 mg by mouth.    . Multiple Vitamin (MULTIVITAMIN) tablet Take 1 tablet by mouth daily.      . Omega-3 Fatty Acids (FISH OIL) 1000 MG CAPS Take  1,000 mg by mouth daily.    Marland Kitchen omeprazole (PRILOSEC) 20 MG capsule Take 20 mg by mouth daily.    Marland Kitchen POTASSIUM PO Take 99 mg by mouth.    . Probiotic Product (PROBIOTIC PO) Take by mouth.    . Teriparatide, Recombinant, 600 MCG/2.4ML SOLN Inject 20 mcg into the skin daily.    Marland Kitchen UNABLE TO FIND 450 mg. Med Name: Damiana leaves     No current facility-administered medications for this visit.      ALLERGIES: Patient has no known allergies.  Family History  Problem Relation Age of Onset  . Osteoarthritis Mother   . Hyperlipidemia Mother   . Depression Mother   . Liver cancer Maternal Grandmother     mets to breast  . Breast cancer Maternal Grandmother     liver mets to breast  . Cancer Maternal Grandmother     with mets  . Hyperlipidemia Father   . Coronary artery disease Father   . Hypertension Father   . Stroke Father     multiple  . Diabetes Father   . Colon cancer Neg Hx     Social History   Social History  . Marital status: Married    Spouse name: N/A  . Number of children: N/A  . Years of education: N/A   Occupational History  . Not on file.   Social History Main Topics  . Smoking status: Former Smoker    Packs/day: 0.50    Years: 6.00    Types: Cigarettes    Quit date: 03/01/1989  . Smokeless tobacco: Never Used     Comment: Quit in 1991- started in 1983 1/2ppd  . Alcohol use No  . Drug use: No  . Sexual activity: Yes    Partners: Male    Birth control/ protection: Other-see comments     Comment: vasectomy   Other Topics Concern  . Not on file   Social History Narrative  . No narrative on file    ROS:  Pertinent items are noted in HPI.  PHYSICAL EXAMINATION:    BP 128/80 (BP Location: Right Arm, Patient Position: Sitting, Cuff Size: Large)   Pulse 84   Resp 20   Ht 5' 3.75" (1.619 m)   Wt 259 lb (117.5 kg)   BMI 44.81 kg/m     General appearance: alert, cooperative and appears stated age.  Cheerful.   Pelvic: External genitalia:  no lesions               Urethra:  normal appearing urethra with no masses, tenderness or lesions  Bartholins and Skenes: normal                 Vagina: normal appearing vagina with normal color and discharge, no lesions              Cervix: no lesions.. IUD strings noted.                Bimanual Exam:  Uterus:  normal size, contour, position, consistency, mobility, non-tender              Adnexa: no mass, fullness, tenderness            Chaperone was present for exam.  ASSESSMENT  Mirena IUD placement for abnormal perimenopausal bleeding. Doing well.   PLAN  Keep bleeding calendar.  Follow up for annual exam and prn.  Great job with the weight loss!   An After Visit Summary was printed and given to the patient.  __15____ minutes face to face time of which over 50% was spent in counseling.

## 2016-04-02 ENCOUNTER — Ambulatory Visit: Payer: Self-pay | Admitting: Obstetrics and Gynecology

## 2016-05-07 ENCOUNTER — Ambulatory Visit: Payer: Managed Care, Other (non HMO) | Admitting: Family Medicine

## 2016-06-24 ENCOUNTER — Encounter: Payer: Self-pay | Admitting: Family Medicine

## 2016-06-24 ENCOUNTER — Ambulatory Visit (INDEPENDENT_AMBULATORY_CARE_PROVIDER_SITE_OTHER): Payer: 59 | Admitting: Family Medicine

## 2016-06-24 VITALS — BP 155/76 | HR 80 | Ht 63.75 in | Wt 243.7 lb

## 2016-06-24 DIAGNOSIS — I10 Essential (primary) hypertension: Secondary | ICD-10-CM | POA: Diagnosis not present

## 2016-06-24 DIAGNOSIS — N926 Irregular menstruation, unspecified: Secondary | ICD-10-CM

## 2016-06-24 DIAGNOSIS — R739 Hyperglycemia, unspecified: Secondary | ICD-10-CM | POA: Diagnosis not present

## 2016-06-24 DIAGNOSIS — E784 Other hyperlipidemia: Secondary | ICD-10-CM | POA: Diagnosis not present

## 2016-06-24 DIAGNOSIS — G4733 Obstructive sleep apnea (adult) (pediatric): Secondary | ICD-10-CM

## 2016-06-24 DIAGNOSIS — E559 Vitamin D deficiency, unspecified: Secondary | ICD-10-CM

## 2016-06-24 DIAGNOSIS — E7849 Other hyperlipidemia: Secondary | ICD-10-CM

## 2016-06-24 DIAGNOSIS — Z6841 Body Mass Index (BMI) 40.0 and over, adult: Secondary | ICD-10-CM

## 2016-06-24 NOTE — Patient Instructions (Signed)

## 2016-06-24 NOTE — Progress Notes (Signed)
New patient office visit note:  Impression and Recommendations:    1. Essential hypertension   2. h/o hyperlipidemia   3. h/o Hyperglycemia   4. BMI 40.0-44.9, adult ([place])   5. Vitamin D deficiency   6. Menstrual irregularity   7. h/o OSA (obstructive sleep apnea)     Hypertension Poorly controlled- been non-compliant and off meds  - keep bp log- bring in next OV  Wt loss, dash diet, exercise  handouts  Hyperlipidemia Used to tolerate statin in past but been off meds many months  - check FLP and CMP  BMI 40.0-44.9, adult ([facility]) Doing the "zola dietary plan", which is low carb, high protein etc  - encouraged to cont wt loss and healthy habits to reach goals.  h/o Hyperglycemia family h/o DM ( father).  Will check glucose and A1c today  - low carb, exercise, wt loss   h/o OSA (obstructive sleep apnea) Reminded pt that poorly controlled OSA can lead to high BP  - encouraged to use night guard she used in past with good results  Vitamin D deficiency Check vit D level  Txmnt as needed;  Asked pt write down exactly how much vit D supp she is taking and let us know.   Orders Placed This Encounter  Procedures  . CBC with Differential/Platelet  . Comprehensive metabolic panel  . Hemoglobin A1c  . Lipid panel  . TSH  . VITAMIN D 25 Hydroxy (Vit-D Deficiency, Fractures)     Patient's Medications  New Prescriptions   No medications on file  Previous Medications   B COMPLEX VITAMINS TABLET    Take 1 tablet by mouth daily.   CALCIUM PO    Take by mouth daily.   CHOLECALCIFEROL (VITAMIN D PO)    Take 1,000 mg by mouth daily.   COENZYME Q10 (CO Q 10 PO)    Take by mouth daily.   LEVONORGESTREL (MIRENA) 20 MCG/24HR IUD    1 each by Intrauterine route once.   MAGNESIUM PO    Take 500 mg by mouth.   MULTIPLE VITAMIN (MULTIVITAMIN) TABLET    Take 1 tablet by mouth daily.     OMEGA-3 FATTY ACIDS (FISH OIL) 1000 MG CAPS    Take 1,000 mg by mouth daily.   OMEPRAZOLE (PRILOSEC) 20 MG CAPSULE    Take 20 mg by mouth daily as needed.   POTASSIUM PO    Take 99 mg by mouth.   PROBIOTIC PRODUCT (PROBIOTIC PO)    Take by mouth.   ST JOHNS WORT PO    Take 2 tablets by mouth daily at 8 pm.   TERIPARATIDE, RECOMBINANT, 600 MCG/2.4ML SOLN    Inject 20 mcg into the skin daily.   UNABLE TO FIND    450 mg. Med Name: Damiana leaves  Modified Medications   No medications on file  Discontinued Medications   OMEPRAZOLE (PRILOSEC) 20 MG CAPSULE    Take 20 mg by mouth daily.    The patient was counseled, risk factors were discussed, anticipatory guidance given.  Gross side effects, risk and benefits, and alternatives of medications discussed with patient.  Patient is aware that all medications have potential side effects and we are unable to predict every side effect or drug-drug interaction that may occur.  Expresses verbal understanding and consents to current therapy plan and treatment regimen.   Return in about 4 weeks (around 07/22/2016) for come in w BP log please next OV.   Please  see AVS handed out to patient at the end of our visit for further patient instructions/ counseling done pertaining to today's office visit.    Note: This document was prepared using Dragon voice recognition software and may include unintentional dictation errors.  ----------------------------------------------------------------------------------------------------------------------    Subjective:    Chief complaint:   Chief Complaint  Patient presents with  . Establish Care     HPI: Alejandra Miller is a pleasant 56 y.o. female who presents to [facility] today to review their medical history with me and establish care.   I asked the patient to review their chronic problem list with me to ensure everything was updated and accurate.    All recent office visits with other providers, any medical records that patient brought in etc  - I reviewed  today.     Also asked pt to get me medical records from [facility] providers/ specialists that they had seen within the past 3-5 years- if they are in private practice and/or do not work for a [employer], [facility], [place], [place] or [employer] owned practice.  Told them to call their specialists to clarify this if they are not sure.   -- Used to be on 2 chol meds, 2 Bp meds and 2 meds for dep/ anxiety in past.  Denies mood d/o now. No anxiety or depression. Sleeping well.  -- doesn't check BP at home, denies Sx at all  -- Has been focusing on wt loss and thought she did not need meds for her conditions.  Hasn't seen doc in long while per pt.     Patient Care Team    Relationship Specialty Notifications Start End  Mellody Dance, DO PCP - General Family Medicine  06/24/16   Nunzio Cobbs, MD Consulting Physician Obstetrics and Gynecology  05/19/15   Roseanne Kaufman, MD Consulting Physician Orthopedic Surgery  06/24/16   Kathi Simpers, MD Referring Physician Orthopedic Surgery  06/24/16   Gatha Mayer, MD Consulting Physician Gastroenterology  06/24/16   Halina Maidens [place], PA-C  Pain Medicine  06/24/16       Wt Readings from Last 3 Encounters:  06/24/16 243 lb 11.2 oz (110.5 kg)  04/01/16 259 lb (117.5 kg)  12/24/15 275 lb 12.8 oz (125.1 kg)   BP Readings from Last 3 Encounters:  06/24/16 (!) 155/76  04/01/16 128/80  03/04/16 132/78   Pulse Readings from Last 3 Encounters:  06/24/16 80  04/01/16 84  03/04/16 68   BMI Readings from Last 3 Encounters:  06/24/16 42.16 kg/m  04/01/16 44.81 kg/m  12/24/15 48.86 kg/m     Patient Active Problem List   Diagnosis Date Noted  . BMI 40.0-44.9, adult ([place]) 02/12/2012    Priority: High  . Hyperlipidemia 07/28/2010    Priority: High  . Hypertension 07/28/2010    Priority: High  . h/o Hyperglycemia 05/19/2015    Priority: Medium  . h/o OSA (obstructive sleep apnea) 08/11/2010    Priority: Medium  . h/o Depression  07/28/2010    Priority: Medium  . Vitamin D deficiency 12/15/2010    Priority: Low  . Fracture of right fibula 05/19/2015  . Uterine polyp 05/19/2015  . Bursitis of shoulder 11/05/2013  . Cervical cancer screening 07/20/2012  . Leg swelling 12/08/2011  . Annual physical exam 07/28/2011  . Menstrual irregularity 12/15/2010  . Arthralgia 07/28/2010     Past Medical History:  Diagnosis Date  . Anxiety    NO  MEDS  . Cervical cancer screening 07/20/2012   Menarche at 13, regular til recently No h/o abnormal paps, last pap 2-3 years ago G0P0  MGM today, no h/o abnormal MGMs Spotting now monthly and scant H/o uterine polyps  . Depression    counseling in 2010 for 2 months on medication  . Fibrocystic breast 1988  . Fibroids 2014  . Fracture of right fibula 05/19/2015   October 2016  . GERD (gastroesophageal reflux disease) 2005   treated with omeprazole  . High cholesterol 2002  . History of sinusitis    once/twice per year  . Hyperglycemia 05/19/2015  . Hypertension 2002  . MRSA (methicillin resistant staph aureus) culture positive 2007  . OSA (obstructive sleep apnea)    +sleep study 08/2010., DOES NOT USE CPAP, USES MOSES MOUTH PIECE  . Osteoarthritis    KNEES, HANDS, HIPS  . Osteoporosis   . Plantar fasciitis of left foot 03/2014  . Seasonal allergies    affected in the spring  . Sleep apnea    mouthpiece "Moses"  . Uterine polyp   . 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 Wrist fracture 07/2012   right     Past Medical History:  Diagnosis Date  . Anxiety    NO MEDS  . Cervical cancer screening 07/20/2012   Menarche at 13, regular til recently No h/o abnormal paps, last pap 2-3 years ago G0P0  MGM today, no h/o abnormal MGMs Spotting now monthly and scant H/o uterine polyps  . Depression    counseling in 2010 for 2 months on medication  . Fibrocystic breast 1988  . Fibroids 2014  . Fracture of right fibula 05/19/2015   October 2016  .  GERD (gastroesophageal reflux disease) 2005   treated with omeprazole  . High cholesterol 2002  . History of sinusitis    once/twice per year  . Hyperglycemia 05/19/2015  . Hypertension 2002  . MRSA (methicillin resistant staph aureus) culture positive 2007  . OSA (obstructive sleep apnea)    +sleep study 08/2010., DOES NOT USE CPAP, USES MOSES MOUTH PIECE  . Osteoarthritis    KNEES, HANDS, HIPS  . Osteoporosis   . Plantar fasciitis of left foot 03/2014  . Seasonal allergies    affected in the spring  . Sleep apnea    mouthpiece "Moses"  . Uterine polyp   . 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 Wrist fracture 07/2012   right     Past Surgical History:  Procedure Laterality Date  . DILATATION & CURETTAGE/HYSTEROSCOPY WITH MYOSURE N/A 07/30/2014   Procedure: DILATATION & CURETTAGE/HYSTEROSCOPY WITH MYOSURE;  Surgeon: Nunzio Cobbs, MD;  Location: [facility];  Service: Gynecology;  Laterality: N/A;  extra time for BMI 49.  Marland Kitchen DILATATION & CURRETTAGE/HYSTEROSCOPY WITH RESECTOCOPE N/A 11/24/2012   Procedure: DILATATION & CURETTAGE/HYSTEROSCOPY WITH RESECTOCOPE;  Surgeon: Arloa Koh, MD;  Location: [facility];  Service: Gynecology;  Laterality: N/A;  . ESSURE TUBAL LIGATION Left   . TONSILLECTOMY  1969  . WISDOM TOOTH EXTRACTION  1979  . WRIST FRACTURE SURGERY Right 07/2012     Family History  Problem Relation Age of Onset  . Osteoarthritis Mother   . Hyperlipidemia Mother   . Depression Mother   . Liver cancer Maternal Grandmother     mets to breast  . Breast cancer Maternal Grandmother     liver mets to breast  . Cancer  Maternal Grandmother     with mets  . Hyperlipidemia Father   . Coronary artery disease Father   . Hypertension Father   . Stroke Father     multiple  . Diabetes Father   . Colon cancer Neg Hx      History  Drug Use No     History  Alcohol Use No     History  Smoking Status  . Former Smoker  . Packs/day:  0.50  . Years: 6.00  . Types: Cigarettes  . Quit date: 03/01/1989  Smokeless Tobacco  . Never Used    Comment: Quit in 1991- started in 1983 1/2ppd     Outpatient Encounter Prescriptions as of 06/24/2016  Medication Sig Note  . b complex vitamins tablet Take 1 tablet by mouth daily.   Marland Kitchen CALCIUM PO Take by mouth daily.   . Cholecalciferol (VITAMIN D PO) Take 1,000 mg by mouth daily.   Marland Kitchen levonorgestrel (MIRENA) 20 MCG/24HR IUD 1 each by Intrauterine route once.   Marland Kitchen MAGNESIUM PO Take 500 mg by mouth.   . Multiple Vitamin (MULTIVITAMIN) tablet Take 1 tablet by mouth daily.     Marland Kitchen omeprazole (PRILOSEC) 20 MG capsule Take 20 mg by mouth daily as needed.   Marland Kitchen POTASSIUM PO Take 99 mg by mouth.   . Probiotic Product (PROBIOTIC PO) Take by mouth.   . ST JOHNS WORT PO Take 2 tablets by mouth daily at 8 pm.   . Teriparatide, Recombinant, 600 MCG/2.4ML SOLN Inject 20 mcg into the skin daily. 01/02/2016: Received from: [facility] Received Sig: Inject 0.08 mLs (20 mcg total) into the skin daily.  Marland Kitchen UNABLE TO FIND 450 mg. Med Name: Damiana leaves   . Coenzyme Q10 (CO Q 10 PO) Take by mouth daily.   . Omega-3 Fatty Acids (FISH OIL) 1000 MG CAPS Take 1,000 mg by mouth daily.   . [DISCONTINUED] omeprazole (PRILOSEC) 20 MG capsule Take 20 mg by mouth daily.    No facility-administered encounter medications on file as of 06/24/2016.     Allergies: Patient has no known allergies.   ROS   Objective:   Blood pressure (!) 155/76, pulse 80, height 5' 3.75" (1.619 m), weight 243 lb 11.2 oz (110.5 kg). Body mass index is 42.16 kg/m. General: Well Developed, well nourished, and in no acute distress.  Neuro: Alert and oriented x3, extra-ocular muscles intact, sensation grossly intact.  HEENT:Heath/AT, PERRLA, neck supple, No carotid bruits Skin: no gross rashes  Cardiac: Regular rate and rhythm Respiratory: Essentially clear to auscultation bilaterally. Not using accessory muscles,  speaking in full sentences.  Abdominal: not grossly distended Musculoskeletal: Ambulates w/o diff, FROM * 4 ext.  Vasc: less 2 sec cap RF, warm and pink  Psych:  No HI/SI, judgement and insight good, Euthymic mood. Full Affect.

## 2016-06-25 LAB — CBC WITH DIFFERENTIAL/PLATELET
BASOS ABS: 0 10*3/uL (ref 0.0–0.2)
Basos: 1 %
EOS (ABSOLUTE): 0.1 10*3/uL (ref 0.0–0.4)
Eos: 2 %
Hematocrit: 45.1 % (ref 34.0–46.6)
Hemoglobin: 15.1 g/dL (ref 11.1–15.9)
Immature Grans (Abs): 0 10*3/uL (ref 0.0–0.1)
Immature Granulocytes: 0 %
LYMPHS ABS: 1.8 10*3/uL (ref 0.7–3.1)
LYMPHS: 34 %
MCH: 26.8 pg (ref 26.6–33.0)
MCHC: 33.5 g/dL (ref 31.5–35.7)
MCV: 80 fL (ref 79–97)
Monocytes Absolute: 0.5 10*3/uL (ref 0.1–0.9)
Monocytes: 9 %
NEUTROS ABS: 2.9 10*3/uL (ref 1.4–7.0)
Neutrophils: 54 %
PLATELETS: 372 10*3/uL (ref 150–379)
RBC: 5.63 x10E6/uL — ABNORMAL HIGH (ref 3.77–5.28)
RDW: 15.1 % (ref 12.3–15.4)
WBC: 5.4 10*3/uL (ref 3.4–10.8)

## 2016-06-25 LAB — VITAMIN D 25 HYDROXY (VIT D DEFICIENCY, FRACTURES): Vit D, 25-Hydroxy: 22.5 ng/mL — ABNORMAL LOW (ref 30.0–100.0)

## 2016-06-25 LAB — COMPREHENSIVE METABOLIC PANEL
ALT: 40 IU/L — AB (ref 0–32)
AST: 29 IU/L (ref 0–40)
Albumin/Globulin Ratio: 2 (ref 1.2–2.2)
Albumin: 4.7 g/dL (ref 3.5–5.5)
Alkaline Phosphatase: 94 IU/L (ref 39–117)
BILIRUBIN TOTAL: 0.7 mg/dL (ref 0.0–1.2)
BUN / CREAT RATIO: 15 (ref 9–23)
BUN: 11 mg/dL (ref 6–24)
CHLORIDE: 102 mmol/L (ref 96–106)
CO2: 26 mmol/L (ref 18–29)
Calcium: 10.2 mg/dL (ref 8.7–10.2)
Creatinine, Ser: 0.74 mg/dL (ref 0.57–1.00)
GFR calc Af Amer: 105 mL/min/{1.73_m2} (ref >59)
GFR calc non Af Amer: 91 mL/min/{1.73_m2} (ref >59)
GLUCOSE: 92 mg/dL (ref 65–99)
Globulin, Total: 2.3 g/dL (ref 1.5–4.5)
Potassium: 4.8 mmol/L (ref 3.5–5.2)
Sodium: 144 mmol/L (ref 134–144)
Total Protein: 7 g/dL (ref 6.0–8.5)

## 2016-06-25 LAB — LIPID PANEL
CHOLESTEROL TOTAL: 252 mg/dL — AB (ref 100–199)
Chol/HDL Ratio: 7.4 ratio — ABNORMAL HIGH (ref 0.0–4.4)
HDL: 34 mg/dL — ABNORMAL LOW (ref >39)
LDL Calculated: 196 mg/dL — ABNORMAL HIGH (ref 0–99)
TRIGLYCERIDES: 109 mg/dL (ref 0–149)
VLDL Cholesterol Cal: 22 mg/dL (ref 5–40)

## 2016-06-25 LAB — HEMOGLOBIN A1C
Est. average glucose Bld gHb Est-mCnc: 100 mg/dL
HEMOGLOBIN A1C: 5.1 % (ref 4.8–5.6)

## 2016-06-25 LAB — TSH: TSH: 1.17 u[IU]/mL (ref 0.450–4.500)

## 2016-06-26 NOTE — Assessment & Plan Note (Signed)
family h/o DM ( father).  Will check glucose and A1c today  - low carb, exercise, wt loss

## 2016-06-26 NOTE — Assessment & Plan Note (Addendum)
Check vit D level  Txmnt as needed;  Asked pt write down exactly how much vit D supp she is taking and let us know.

## 2016-06-26 NOTE — Assessment & Plan Note (Signed)
Doing the "zola dietary plan", which is low carb, high protein etc  - encouraged to cont wt loss and healthy habits to reach goals.

## 2016-06-26 NOTE — Assessment & Plan Note (Signed)
Poorly controlled- been non-compliant and off meds  - keep bp log- bring in next OV  Wt loss, dash diet, exercise  handouts

## 2016-06-26 NOTE — Assessment & Plan Note (Signed)
Used to tolerate statin in past but been off meds many months  - check FLP and CMP

## 2016-06-26 NOTE — Assessment & Plan Note (Signed)
Reminded pt that poorly controlled OSA can lead to high BP  - encouraged to use night guard she used in past with good results

## 2016-07-28 ENCOUNTER — Ambulatory Visit: Payer: Managed Care, Other (non HMO) | Admitting: Family Medicine

## 2016-08-01 ENCOUNTER — Encounter: Payer: Self-pay | Admitting: Family Medicine

## 2016-08-02 ENCOUNTER — Encounter: Payer: Self-pay | Admitting: Medical

## 2016-08-05 ENCOUNTER — Ambulatory Visit: Payer: Managed Care, Other (non HMO) | Admitting: Family Medicine

## 2016-08-16 ENCOUNTER — Ambulatory Visit: Payer: Managed Care, Other (non HMO) | Admitting: Family Medicine

## 2016-11-23 DIAGNOSIS — G8929 Other chronic pain: Secondary | ICD-10-CM | POA: Insufficient documentation

## 2016-12-02 NOTE — Progress Notes (Signed)
56 y.o. G0P0 Married Caucasian female here for annual exam.    No bleeding or spotting since Feb. 2018.  Mirena IUD placed 03/04/16.  ROS - muscle and joint pain.  HTN.  Anxiety.    New job starting in July.  Husband had shoulder surgery.   PCP: [name], PA-C   No LMP recorded. Patient is not currently having periods (Reason: IUD).           Sexually active: Yes.    female The current method of family planning is vasectomy/Mirena IUD inserted 03-04-16.    Exercising: No.   Smoker:  no  Health Maintenance: Pap: 10-12-13 Neg:Neg HR HPV, 07-20-12 Neg History of abnormal Pap:  no MMG: 01-02-16 Density B/Neg/BiRads1:[facility] Colonoscopy:  2015 - normal.  BMD: 04-10-15  Result :Normal, hx of [place] TDaP: 2014 Gardasil:   no HIV: 2006 Neg Hep C: 05-19-15 Neg Screening Labs:  Hb today: PCP, Urine today: not done   reports that she quit smoking about 27 years ago. Her smoking use included Cigarettes. She has a 3.00 pack-year smoking history. She has never used smokeless tobacco. She reports that she does not drink alcohol or use drugs.  Past Medical History:  Diagnosis Date  . Anxiety    NO MEDS  . Cervical cancer screening 07/20/2012   Menarche at 13, regular til recently No h/o abnormal paps, last pap 2-3 years ago G0P0  MGM today, no h/o abnormal MGMs Spotting now monthly and scant H/o uterine polyps  . Depression    counseling in 2010 for 2 months on medication  . Fibrocystic breast 1988  . Fibroids 2014  . Fracture of right fibula 05/19/2015   October 2016  . GERD (gastroesophageal reflux disease) 2005   treated with omeprazole  . High cholesterol 2002  . History of sinusitis    once/twice per year  . Hyperglycemia 05/19/2015  . Hypertension 2002  . MRSA (methicillin resistant staph aureus) culture positive 2007  . OSA (obstructive sleep apnea)    +sleep study 08/2010., DOES NOT USE CPAP, USES MOSES MOUTH PIECE  . Osteoarthritis    KNEES,  HANDS, HIPS  . Osteoporosis   . Plantar fasciitis of left foot 03/2014  . Seasonal allergies    affected in the spring  . Sleep apnea    mouthpiece "Moses"  . Uterine polyp   . 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 Wrist fracture 07/2012   right    Past Surgical History:  Procedure Laterality Date  . DILATATION & CURETTAGE/HYSTEROSCOPY WITH MYOSURE N/A 07/30/2014   Procedure: DILATATION & CURETTAGE/HYSTEROSCOPY WITH MYOSURE;  Surgeon: Nunzio Cobbs, MD;  Location: [facility];  Service: Gynecology;  Laterality: N/A;  extra time for BMI 49.  Marland Kitchen DILATATION & CURRETTAGE/HYSTEROSCOPY WITH RESECTOCOPE N/A 11/24/2012   Procedure: DILATATION & CURETTAGE/HYSTEROSCOPY WITH RESECTOCOPE;  Surgeon: Arloa Koh, MD;  Location: [facility];  Service: Gynecology;  Laterality: N/A;  . ESSURE TUBAL LIGATION Left   . TONSILLECTOMY  1969  . WISDOM TOOTH EXTRACTION  1979  . WRIST FRACTURE SURGERY Right 07/2012    Current Outpatient Prescriptions  Medication Sig Dispense Refill  . b complex vitamins tablet Take 1 tablet by mouth daily.    Marland Kitchen CALCIUM PO Take by mouth daily.    . Cholecalciferol (VITAMIN D PO) Take 1,000 mg by mouth daily.    . Coenzyme Q10 (CO Q 10 PO) Take by mouth daily.    Marland Kitchen  cyclobenzaprine (FLEXERIL) 10 MG tablet Take 1 tablet by mouth as needed.    Marland Kitchen ibuprofen (ADVIL,MOTRIN) 800 MG tablet Take 1 tablet by mouth as needed.    Marland Kitchen levonorgestrel (MIRENA) 20 MCG/24HR IUD 1 each by Intrauterine route once.    Marland Kitchen lisinopril-hydrochlorothiazide (PRINZIDE,ZESTORETIC) 20-25 MG tablet Take 1 tablet by mouth daily.    Marland Kitchen MAGNESIUM PO Take 500 mg by mouth.    . Melatonin 3 MG TABS Take 1 tablet by mouth at bedtime.    . Multiple Vitamin (MULTIVITAMIN) tablet Take 1 tablet by mouth daily.      . mupirocin cream (BACTROBAN) 2 % Apply 1 application topically as needed.    . Omega-3 Fatty Acids (FISH OIL) 1000 MG CAPS Take 1,000 mg by mouth daily.    Marland Kitchen omeprazole  (PRILOSEC) 20 MG capsule Take 20 mg by mouth daily as needed.    Marland Kitchen POTASSIUM PO Take 99 mg by mouth.    . Probiotic Product (PROBIOTIC PO) Take by mouth.    . Teriparatide, Recombinant, 600 MCG/2.4ML SOLN Inject into the skin.    Marland Kitchen venlafaxine XR (EFFEXOR-XR) 37.5 MG 24 hr capsule Take 1 capsule by mouth daily.    . vitamin B-12 (CYANOCOBALAMIN) 1000 MCG tablet Take 1 tablet by mouth daily.     No current facility-administered medications for this visit.     Family History  Problem Relation Age of Onset  . Osteoarthritis Mother   . Hyperlipidemia Mother   . Depression Mother   . Alzheimer's disease Mother   . Liver cancer Maternal Grandmother        mets to breast  . Breast cancer Maternal Grandmother        liver mets to breast  . Cancer Maternal Grandmother        with mets  . Hyperlipidemia Father   . Coronary artery disease Father   . Hypertension Father   . Stroke Father        multiple  . Diabetes Father   . Colon cancer Neg Hx     ROS:  Pertinent items are noted in HPI.  Otherwise, a comprehensive ROS was negative.  Exam:   BP (!) 134/96 (BP Location: Right Arm, Patient Position: Sitting, Cuff Size: Large)   Pulse 66   Resp 16   Ht 5' 3.75" (1.619 m)   Wt 242 lb (109.8 kg)   BMI 41.87 kg/m     General appearance: alert, cooperative and appears stated age Head: Normocephalic, without obvious abnormality, atraumatic Neck: no adenopathy, supple, symmetrical, trachea midline and thyroid normal to inspection and palpation Lungs: clear to auscultation bilaterally Breasts: normal appearance, no masses or tenderness, No nipple retraction or dimpling, No nipple discharge or bleeding, No axillary or supraclavicular adenopathy Heart: regular rate and rhythm Abdomen: soft, non-tender; no masses, no organomegaly Extremities: extremities normal, atraumatic, no cyanosis or edema Skin: Skin color, texture, turgor normal. No rashes or lesions Lymph nodes: Cervical,  supraclavicular, and axillary nodes normal. No abnormal inguinal nodes palpated Neurologic: Grossly normal  Pelvic: External genitalia:  no lesions              Urethra:  normal appearing urethra with no masses, tenderness or lesions              Bartholins and Skenes: normal                 Vagina: normal appearing vagina with normal color and discharge, no lesions  Cervix: no lesions              Pap taken: Yes.  IUD strings noted. Bimanual Exam:  Uterus:  normal size, contour, position, consistency, mobility, non-tender              Adnexa: no mass, fullness, tenderness              Rectal exam: Yes.  .  Confirms.              Anus:  normal sphincter tone, no lesions  Chaperone was present for exam.  Assessment:   Well woman visit with normal exam. Mirena IUD.  erimenopausal female.  Has unilateral Essure. HTN.  Anxiety.  Osteoporosis.  On [place]eo.  Plan: Mammogram screening discussed. Recommended self breast awareness. Pap and HR HPV as above. My plan is to leave the Mirena IUD in for the full 5 years unless the patient is having a problem.  Guidelines for Calcium, Vitamin D, regular exercise program including cardiovascular and weight bearing exercise. Labs with PCP.  Follow up annually and prn.   After visit summary provided.

## 2016-12-03 ENCOUNTER — Ambulatory Visit (INDEPENDENT_AMBULATORY_CARE_PROVIDER_SITE_OTHER): Payer: 59 | Admitting: Obstetrics and Gynecology

## 2016-12-03 ENCOUNTER — Encounter: Payer: Self-pay | Admitting: Obstetrics and Gynecology

## 2016-12-03 ENCOUNTER — Other Ambulatory Visit: Payer: Self-pay | Admitting: Obstetrics and Gynecology

## 2016-12-03 ENCOUNTER — Other Ambulatory Visit (HOSPITAL_COMMUNITY)
Admission: RE | Admit: 2016-12-03 | Discharge: 2016-12-03 | Disposition: A | Discharge status: Home / Self Care | Payer: 59 | Source: Ambulatory Visit | Attending: Obstetrics and Gynecology | Admitting: Obstetrics and Gynecology

## 2016-12-03 VITALS — BP 134/96 | HR 66 | Resp 16 | Ht 63.75 in | Wt 242.0 lb

## 2016-12-03 DIAGNOSIS — Z1151 Encounter for screening for human papillomavirus (HPV): Secondary | ICD-10-CM | POA: Diagnosis not present

## 2016-12-03 DIAGNOSIS — Z01419 Encounter for gynecological examination (general) (routine) without abnormal findings: Secondary | ICD-10-CM | POA: Diagnosis present

## 2016-12-03 DIAGNOSIS — Z1231 Encounter for screening mammogram for malignant neoplasm of breast: Secondary | ICD-10-CM

## 2016-12-03 NOTE — Patient Instructions (Signed)

## 2016-12-07 LAB — CYTOLOGY - PAP
DIAGNOSIS: NEGATIVE
HPV (WINDOPATH): NOT DETECTED

## 2017-01-10 ENCOUNTER — Ambulatory Visit (HOSPITAL_BASED_OUTPATIENT_CLINIC_OR_DEPARTMENT_OTHER)
Admission: RE | Admit: 2017-01-10 | Discharge: 2017-01-10 | Disposition: A | Discharge status: Home / Self Care | Payer: 59 | Source: Ambulatory Visit | Attending: Obstetrics and Gynecology | Admitting: Obstetrics and Gynecology

## 2017-01-10 DIAGNOSIS — N6489 Other specified disorders of breast: Secondary | ICD-10-CM | POA: Insufficient documentation

## 2017-01-10 DIAGNOSIS — R928 Other abnormal and inconclusive findings on diagnostic imaging of breast: Secondary | ICD-10-CM | POA: Insufficient documentation

## 2017-01-10 DIAGNOSIS — Z1231 Encounter for screening mammogram for malignant neoplasm of breast: Secondary | ICD-10-CM | POA: Diagnosis present

## 2017-01-11 ENCOUNTER — Other Ambulatory Visit: Payer: Self-pay | Admitting: Obstetrics and Gynecology

## 2017-01-11 DIAGNOSIS — R928 Other abnormal and inconclusive findings on diagnostic imaging of breast: Secondary | ICD-10-CM

## 2017-01-18 ENCOUNTER — Ambulatory Visit
Admission: RE | Admit: 2017-01-18 | Discharge: 2017-01-18 | Disposition: A | Discharge status: Home / Self Care | Payer: Managed Care, Other (non HMO) | Source: Ambulatory Visit | Attending: Obstetrics and Gynecology | Admitting: Obstetrics and Gynecology

## 2017-01-18 DIAGNOSIS — R928 Other abnormal and inconclusive findings on diagnostic imaging of breast: Secondary | ICD-10-CM

## 2017-10-24 ENCOUNTER — Telehealth: Payer: Self-pay | Admitting: Obstetrics and Gynecology

## 2017-10-24 NOTE — Telephone Encounter (Signed)
Patient says she started bleeding today after not doing so for over a year.

## 2017-10-24 NOTE — Telephone Encounter (Signed)
Call to patient. Patient states that she hasn't had any bleeding since around 01-2016. Had mirena placed 03-04-16. States she has noticed some bleeding with wiping today. States this morning when she wiped it was about the size of a "dime." This afternoon has increased to the size of "medium apple." Wearing a pad, but nothing on pad. Patient states she is having some mild cramping. Denies discharge, odor, fever or pain with intercourse. Also denies any urinary symptoms. OV offered to patient, but she states she's "not worried, just wanted Dr. Elza Rafter recommendations." Patient states she will schedule if that's what is recommended by Dr. Quincy Simmonds. RN advised would review with Dr. Quincy Simmonds and return call. Patient agreeable.   Routing to provider for review.

## 2017-10-24 NOTE — Telephone Encounter (Signed)
I would like to see her for an office visit this week. Preferable on Thursday.   May need pelvic US, so please precert.

## 2017-10-24 NOTE — Telephone Encounter (Signed)
Call to patient. Message given to patient as seen below from Dr. Quincy Simmonds. Patient agreeable to schedule. Patient scheduled for 10-27-17 at 0930. Patient agreeable to date and time of appointment. Bleeding precautions reviewed with patient and she verbalized understanding.   Message given to [facility] for PUS precert.   Routing to provider for final review. Patient agreeable to disposition. Will close encounter.    CC Suzy [employer]

## 2017-10-26 NOTE — Progress Notes (Signed)
GYNECOLOGY  VISIT   HPI: 57 y.o.   Married  Caucasian  female   G0P0 with Patient's last menstrual period was 02/02/2016 (within weeks).   here for abnormal uterine bleeding that started Monday 10/24/17. Bleeding has been light overall.  Cramping on 10/24/17.  States she has felt off for the last week or two.  Easily irritated and more achy.  No fevers.  No nausea or vomiting.  No unusual discharge.  Some lower back pain and increased with recent bleeding.  No pain with intercourse.   Has Mirena IUD placed on 03/04/16.  Had bleeding following this in February following this but then stopped.  Otherwise has not had bleeding until now.   Last EMB was 03/04/16 - inactive endometrium with progestin effect.   E2 27 and FSH 42.6 on 11/21/15.    Hx endometrial polyps.   GYNECOLOGIC HISTORY: Patient's last menstrual period was 02/02/2016 (within weeks). Contraception:  Mirena IUD inserted 03/04/16 Menopausal hormone therapy:  none Last mammogram:  01/10/17 BIRADS 0: Incomplete Last pap smear:   12/03/16 Neg:Neg HR HPV        OB History    Gravida  0   Para      Term      Preterm      AB      Living        SAB      TAB      Ectopic      Multiple      Live Births                 Patient Active Problem List   Diagnosis Date Noted  . Fracture of right fibula 05/19/2015  . h/o Hyperglycemia 05/19/2015  . Uterine polyp 05/19/2015  . Bursitis of shoulder 11/05/2013  . Cervical cancer screening 07/20/2012  . BMI 40.0-44.9, adult ([place]) 02/12/2012  . Leg swelling 12/08/2011  . Annual physical exam 07/28/2011  . Menstrual irregularity 12/15/2010  . Vitamin D deficiency 12/15/2010  . h/o OSA (obstructive sleep apnea) 08/11/2010  . Arthralgia 07/28/2010  . Hyperlipidemia 07/28/2010  . Hypertension 07/28/2010  . h/o Depression 07/28/2010    Past Medical History:  Diagnosis Date  . Anxiety    NO MEDS  . Cervical cancer screening 07/20/2012   Menarche at 13,  regular til recently No h/o abnormal paps, last pap 2-3 years ago G0P0  MGM today, no h/o abnormal MGMs Spotting now monthly and scant H/o uterine polyps  . Depression    counseling in 2010 for 2 months on medication  . Fibrocystic breast 1988  . Fibroids 2014  . Fracture of right fibula 05/19/2015   October 2016  . GERD (gastroesophageal reflux disease) 2005   treated with omeprazole  . High cholesterol 2002  . History of sinusitis    once/twice per year  . Hyperglycemia 05/19/2015  . Hypertension 2002  . MRSA (methicillin resistant staph aureus) culture positive 2007  . OSA (obstructive sleep apnea)    +sleep study 08/2010., DOES NOT USE CPAP, USES MOSES MOUTH PIECE  . Osteoarthritis    KNEES, HANDS, HIPS  . Osteoporosis   . Plantar fasciitis of left foot 03/2014  . Seasonal allergies    affected in the spring  . Sleep apnea    mouthpiece "[facility]"  . Uterine polyp   . 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 Wrist fracture 07/2012   right    Past Surgical History:  Procedure Laterality Date  . DILATATION & CURETTAGE/HYSTEROSCOPY WITH MYOSURE N/A 07/30/2014   Procedure: DILATATION & CURETTAGE/HYSTEROSCOPY WITH MYOSURE;  Surgeon: Nunzio Cobbs, MD;  Location: [facility];  Service: Gynecology;  Laterality: N/A;  extra time for BMI 49.  Marland Kitchen DILATATION & CURRETTAGE/HYSTEROSCOPY WITH RESECTOCOPE N/A 11/24/2012   Procedure: DILATATION & CURETTAGE/HYSTEROSCOPY WITH RESECTOCOPE;  Surgeon: Arloa Koh, MD;  Location: [facility];  Service: Gynecology;  Laterality: N/A;  . ESSURE TUBAL LIGATION Left   . TONSILLECTOMY  1969  . WISDOM TOOTH EXTRACTION  1979  . WRIST FRACTURE SURGERY Right 07/2012    Current Outpatient Medications  Medication Sig Dispense Refill  . CALCIUM PO Take by mouth daily.    . Cholecalciferol (VITAMIN D PO) Take 5,000 mg by mouth daily.     . Coenzyme Q10 (CO Q 10 PO) Take by mouth daily.    . cyclobenzaprine (FLEXERIL) 10 MG tablet  Take 1 tablet by mouth as needed.    Marland Kitchen ibuprofen (ADVIL,MOTRIN) 800 MG tablet Take 1 tablet by mouth as needed.    Marland Kitchen levonorgestrel (MIRENA) 20 MCG/24HR IUD 1 each by Intrauterine route once.    Marland Kitchen lisinopril-hydrochlorothiazide (PRINZIDE,ZESTORETIC) 20-25 MG tablet Take 1 tablet by mouth daily.    Marland Kitchen MAGNESIUM PO Take 500 mg by mouth.    . Melatonin 3 MG TABS Take 1 tablet by mouth at bedtime.    . Multiple Vitamin (MULTIVITAMIN) tablet Take 1 tablet by mouth daily.      . mupirocin cream (BACTROBAN) 2 % Apply 1 application topically as needed.    . Omega-3 Fatty Acids (FISH OIL) 1000 MG CAPS Take 1,000 mg by mouth daily.    Marland Kitchen omeprazole (PRILOSEC) 20 MG capsule Take 20 mg by mouth daily as needed.    Marland Kitchen POTASSIUM PO Take 99 mg by mouth.    . Probiotic Product (PROBIOTIC PO) Take by mouth.    . ST JOHNS WORT PO Take by mouth daily.    . Zoledronic Acid (RECLAST IV) Inject into the vein as directed.     No current facility-administered medications for this visit.      ALLERGIES: Patient has no known allergies.  Family History  Problem Relation Age of Onset  . Osteoarthritis Mother   . Hyperlipidemia Mother   . Depression Mother   . Alzheimer's disease Mother   . Liver cancer Maternal Grandmother        mets to breast  . Breast cancer Maternal Grandmother        liver mets to breast  . Cancer Maternal Grandmother        with mets  . Hyperlipidemia Father   . Coronary artery disease Father   . Hypertension Father   . Stroke Father        multiple  . Diabetes Father   . Colon cancer Neg Hx     Social History   Socioeconomic History  . Marital status: Married    Spouse name: Not on file  . Number of children: Not on file  . Years of education: Not on file  . Highest education level: Not on file  Occupational History  . Not on file  Social Needs  . Financial resource strain: Not on file  . Food insecurity:    Worry: Not on file    Inability: Not on file  .  Transportation needs:    Medical: Not on file    Non-medical: Not on file  Tobacco Use  .  Smoking status: Former Smoker    Packs/day: 0.50    Years: 6.00    Pack years: 3.00    Types: Cigarettes    Last attempt to quit: 03/01/1989    Years since quitting: 28.6  . Smokeless tobacco: Never Used  . Tobacco comment: Quit in 1991- started in 1983 1/2ppd  Substance and Sexual Activity  . Alcohol use: No    Alcohol/week: 0.0 standard drinks  . Drug use: No  . Sexual activity: Yes    Partners: Male    Birth control/protection: Other-see comments, IUD    Comment: vasectomy/Mirena IUD inserted 03-04-16  Lifestyle  . Physical activity:    Days per week: Not on file    Minutes per session: Not on file  . Stress: Not on file  Relationships  . Social connections:    Talks on phone: Not on file    Gets together: Not on file    Attends religious service: Not on file    Active member of club or organization: Not on file    Attends meetings of clubs or organizations: Not on file    Relationship status: Not on file  . Intimate partner violence:    Fear of current or ex partner: Not on file    Emotionally abused: Not on file    Physically abused: Not on file    Forced sexual activity: Not on file  Other Topics Concern  . Not on file  Social History Narrative  . Not on file    Review of Systems  Genitourinary:       Unscheduled bleeding   All other systems reviewed and are negative.   PHYSICAL EXAMINATION:    BP (!) 146/88 (BP Location: Right Arm, Patient Position: Sitting, Cuff Size: Large)   Pulse 84   Resp 18   Ht 5' 3.75" (1.619 m)   Wt 270 lb (122.5 kg)   LMP 02/02/2016 (Within Weeks)   BMI 46.71 kg/m     General appearance: alert, cooperative and appears stated age   Pelvic: External genitalia:  no lesions              Urethra:  normal appearing urethra with no masses, tenderness or lesions              Bartholins and Skenes: normal                 Vagina: normal  appearing vagina with normal color and discharge, no lesions              Cervix: no lesions.  IUD strings noted.  No blood.                 Bimanual Exam:  Uterus:  normal size, contour, position, consistency, mobility, non-tender              Adnexa: no mass, fullness, tenderness                Pelvic US 4 small fibroids.  IUD in endometrial canal.  EMS 3.97 mm. Possible small cervical polyp versus clot. Small right follicular cyst - no change.  Left ovary normal.  Left Essure present.  No free fluid.   Chaperone was present for exam.  ASSESSMENT  Mirena IUD patient.  Perimenopausal female. Vaginal bleeding. Likely a menstruation.  Hx polyps.   PLAN  Reassurance given.  Discussion of fibroids and Mirena IUD bleeding profiles. Continue with Mirena IUD. No need for removal of cervical polyp. Keep  bleeding calendar.    An After Visit Summary was printed and given to the patient.  __25____ minutes face to face time of which over 50% was spent in counseling.

## 2017-10-27 ENCOUNTER — Ambulatory Visit: Payer: 59 | Admitting: Obstetrics and Gynecology

## 2017-10-27 ENCOUNTER — Ambulatory Visit (INDEPENDENT_AMBULATORY_CARE_PROVIDER_SITE_OTHER): Payer: 59

## 2017-10-27 ENCOUNTER — Encounter: Payer: Self-pay | Admitting: Obstetrics and Gynecology

## 2017-10-27 VITALS — BP 146/88 | HR 84 | Resp 18 | Ht 63.75 in | Wt 270.0 lb

## 2017-10-27 DIAGNOSIS — Z30431 Encounter for routine checking of intrauterine contraceptive device: Secondary | ICD-10-CM | POA: Diagnosis not present

## 2017-10-27 DIAGNOSIS — N939 Abnormal uterine and vaginal bleeding, unspecified: Secondary | ICD-10-CM | POA: Diagnosis not present

## 2017-10-27 DIAGNOSIS — D219 Benign neoplasm of connective and other soft tissue, unspecified: Secondary | ICD-10-CM

## 2017-10-27 LAB — POCT URINE PREGNANCY: PREG TEST UR: NEGATIVE

## 2017-10-27 NOTE — Progress Notes (Signed)
Encounter reviewed by Dr. Areliz Rothman Amundson C. Silva.  

## 2017-11-28 ENCOUNTER — Other Ambulatory Visit: Payer: Self-pay | Admitting: Obstetrics and Gynecology

## 2017-11-28 DIAGNOSIS — Z1231 Encounter for screening mammogram for malignant neoplasm of breast: Secondary | ICD-10-CM

## 2017-12-08 NOTE — Progress Notes (Signed)
57 y.o. G0P0 Married Caucasian female here for annual exam.    Struggling with weight gain.  Did go to a session about weight loss with [facility] in [place]. Drinking a lot of sodas.  Having diarrhea off and on.   No more bleeding since seen in August.  Has fibroids and IUD noted to be in a normal position.  Had a right ovarian follicle 21 mm. No bleeding since then.   Hx MRSA and has expired Mupirocin cream.  States she needs a new PCP.   PCP: [name], PA-C    Patient's last menstrual period was 10/24/2017.     Period Cycle (Days): (Mirena IUD)     Sexually active: Yes.    The current method of family planning is vasectomy/IUD--Mirena 03-04-16.    Exercising: No.   Smoker:  no  Health Maintenance: Pap: 12-03-16 Neg:Neg HR HPV, 10-12-13 Neg:neg HR HPV History of abnormal Pap:  no MMG: 01-18-17 Density B/poss.Rt.asymmetry--further studies recommended. Diag.Rt.w/Rt.Br.U/S revealed benign cyst in right breast/screening 71yr/BiRads2.  Scheduled for this in November at [facility]. Colonoscopy:  2015 normal BMD:   04-10-15  Result :normal, hx Osteoporosis:[facility].  Status post 2 years of Forteo.  Did one injection of Zoledronic acid.  She has a hx of fragility fracture.   Last BMD was done in Feb. 2019. TDaP:  2014 Gardasil:   no HIV: Neg 2006 Hep C: Neg 05-19-15 Screening Labs:   PCP.    reports that she quit smoking about 28 years ago. Her smoking use included cigarettes. She has a 3.00 pack-year smoking history. She has never used smokeless tobacco. She reports that she does not drink alcohol or use drugs.  Past Medical History:  Diagnosis Date  . Anxiety    NO MEDS  . Cervical cancer screening 07/20/2012   Menarche at 13, regular til recently No h/o abnormal paps, last pap 2-3 years ago G0P0  MGM today, no h/o abnormal MGMs Spotting now monthly and scant H/o uterine polyps  . Depression    counseling in 2010 for 2 months on medication  . Fibrocystic breast  1988  . Fibroids 2014  . Fracture of right fibula 05/19/2015   October 2016  . GERD (gastroesophageal reflux disease) 2005   treated with omeprazole  . High cholesterol 2002  . History of sinusitis    once/twice per year  . Hyperglycemia 05/19/2015  . Hypertension 2002  . MRSA (methicillin resistant staph aureus) culture positive 2007  . OSA (obstructive sleep apnea)    +sleep study 08/2010., DOES NOT USE CPAP, USES MOSES MOUTH PIECE  . Osteoarthritis    KNEES, HANDS, HIPS  . Osteoporosis   . Plantar fasciitis of left foot 03/2014  . Seasonal allergies    affected in the spring  . Sleep apnea    mouthpiece "Moses"  . Uterine polyp   . 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 Wrist fracture 07/2012   right    Past Surgical History:  Procedure Laterality Date  . DILATATION & CURETTAGE/HYSTEROSCOPY WITH MYOSURE N/A 07/30/2014   Procedure: DILATATION & CURETTAGE/HYSTEROSCOPY WITH MYOSURE;  Surgeon: Nunzio Cobbs, MD;  Location: [facility];  Service: Gynecology;  Laterality: N/A;  extra time for BMI 49.  Marland Kitchen DILATATION & CURRETTAGE/HYSTEROSCOPY WITH RESECTOCOPE N/A 11/24/2012   Procedure: DILATATION & CURETTAGE/HYSTEROSCOPY WITH RESECTOCOPE;  Surgeon: Arloa Koh, MD;  Location: [facility];  Service: Gynecology;  Laterality: N/A;  . ESSURE  TUBAL LIGATION Left   . TONSILLECTOMY  1969  . WISDOM TOOTH EXTRACTION  1979  . WRIST FRACTURE SURGERY Right 07/2012    Current Outpatient Medications  Medication Sig Dispense Refill  . CALCIUM PO Take by mouth daily.    . Cholecalciferol (VITAMIN D PO) Take 5,000 mg by mouth daily.     . Coenzyme Q10 (CO Q 10 PO) Take by mouth daily.    . cyclobenzaprine (FLEXERIL) 10 MG tablet Take 1 tablet by mouth as needed.    Marland Kitchen ibuprofen (ADVIL,MOTRIN) 800 MG tablet Take 1 tablet by mouth as needed.    Marland Kitchen levonorgestrel (MIRENA) 20 MCG/24HR IUD 1 each by Intrauterine route once.    Marland Kitchen lisinopril-hydrochlorothiazide  (PRINZIDE,ZESTORETIC) 20-25 MG tablet Take 1 tablet by mouth daily.    Marland Kitchen MAGNESIUM PO Take 500 mg by mouth.    . Melatonin 3 MG TABS Take 1 tablet by mouth at bedtime.    . Multiple Vitamin (MULTIVITAMIN) tablet Take 1 tablet by mouth daily.      . mupirocin cream (BACTROBAN) 2 % Apply 1 application topically as needed.    . Omega-3 Fatty Acids (FISH OIL) 1000 MG CAPS Take 1,000 mg by mouth daily.    Marland Kitchen omeprazole (PRILOSEC) 20 MG capsule Take 20 mg by mouth daily as needed.    Marland Kitchen POTASSIUM PO Take 99 mg by mouth.    . Probiotic Product (PROBIOTIC PO) Take by mouth.    . ST JOHNS WORT PO Take by mouth daily.    . Zoledronic Acid (RECLAST IV) Inject into the vein as directed.     No current facility-administered medications for this visit.     Family History  Problem Relation Age of Onset  . Osteoarthritis Mother   . Hyperlipidemia Mother   . Depression Mother   . Alzheimer's disease Mother   . Liver cancer Maternal Grandmother        mets to breast  . Breast cancer Maternal Grandmother        liver mets to breast  . Cancer Maternal Grandmother        with mets  . Hyperlipidemia Father   . Coronary artery disease Father   . Hypertension Father   . Stroke Father        multiple  . Diabetes Father   . Colon cancer Neg Hx     Review of Systems  Constitutional: Negative.   HENT: Negative.   Eyes: Negative.   Cardiovascular: Negative.   Gastrointestinal: Positive for diarrhea.  Endocrine: Negative.   Genitourinary: Negative.   Musculoskeletal: Negative.   Skin: Negative.   Allergic/Immunologic: Negative.   Neurological: Negative.   Hematological: Negative.   Psychiatric/Behavioral: Negative.     Exam:   BP 138/60 (BP Location: Right Arm, Patient Position: Sitting, Cuff Size: Large)   Pulse 60   Resp 18   Ht 5' 3.2" (1.605 m)   Wt 272 lb (123.4 kg)   LMP 10/24/2017   BMI 47.88 kg/m     General appearance: alert, cooperative and appears stated age Head:  Normocephalic, without obvious abnormality, atraumatic Neck: no adenopathy, supple, symmetrical, trachea midline and thyroid normal to inspection and palpation Lungs: clear to auscultation bilaterally Breasts: normal appearance, no masses or tenderness, No nipple retraction or dimpling, No nipple discharge or bleeding, No axillary or supraclavicular adenopathy Heart: regular rate and rhythm Abdomen: soft, non-tender; no masses, no organomegaly Extremities: extremities normal, atraumatic, no cyanosis or edema Skin: Skin color, texture, turgor normal.  4 - 5 mm area of erythema of left anterior abdominal wall skin. Lymph nodes: Cervical, supraclavicular, and axillary nodes normal. No abnormal inguinal nodes palpated Neurologic: Grossly normal  Pelvic: External genitalia:  Nevus of right medial thigh. 5 mm - salmon pink and grey color.               Urethra:  normal appearing urethra with no masses, tenderness or lesions              Bartholins and Skenes: normal                 Vagina: normal appearing vagina with normal color and discharge, no lesions              Cervix: no lesions.  IUD strings noted.               Pap taken: No. Bimanual Exam:  Uterus:  normal size, contour, position, consistency, mobility, non-tender              Adnexa: no mass, fullness, tenderness              Rectal exam: Yes.  .  Confirms.              Anus:  normal sphincter tone, no lesions  Chaperone was present for exam.  Assessment:   Well woman visit with normal exam. Mirena IUD.  Perimenopausal female.  Has unilateral Essure. HTN.  Anxiety.  Osteoporosis.  Off Forteo.  Followed by osteoporosis clinic at [facility]. BMI 47. Hx MRSA.   Plan: Mammogram screening. Recommended self breast awareness. Pap and HR HPV as above. Guidelines for Calcium, Vitamin D, regular exercise program including cardiovascular and weight bearing exercise. Referral to Dr. Filomena Jungling.  List of PCPs to patient.  Refill of  Bactroban.  Follow up annually and prn.   After visit summary provided.

## 2017-12-09 ENCOUNTER — Other Ambulatory Visit: Payer: Self-pay

## 2017-12-09 ENCOUNTER — Encounter: Payer: Self-pay | Admitting: Obstetrics and Gynecology

## 2017-12-09 ENCOUNTER — Ambulatory Visit (INDEPENDENT_AMBULATORY_CARE_PROVIDER_SITE_OTHER): Payer: 59 | Admitting: Obstetrics and Gynecology

## 2017-12-09 VITALS — BP 138/60 | HR 60 | Resp 18 | Ht 63.2 in | Wt 272.0 lb

## 2017-12-09 DIAGNOSIS — Z6841 Body Mass Index (BMI) 40.0 and over, adult: Secondary | ICD-10-CM | POA: Diagnosis not present

## 2017-12-09 DIAGNOSIS — Z01419 Encounter for gynecological examination (general) (routine) without abnormal findings: Secondary | ICD-10-CM

## 2017-12-09 MED ORDER — MUPIROCIN CALCIUM 2 % EX CREA: 1.0000 "application " | TOPICAL_CREAM | CUTANEOUS | 1 refills | Status: AC | PRN

## 2017-12-09 NOTE — Patient Instructions (Signed)

## 2018-01-13 ENCOUNTER — Ambulatory Visit: Payer: 59

## 2018-02-10 ENCOUNTER — Ambulatory Visit: Payer: 59

## 2018-02-23 ENCOUNTER — Ambulatory Visit
Admission: RE | Admit: 2018-02-23 | Discharge: 2018-02-23 | Disposition: A | Discharge status: Home / Self Care | Payer: 59 | Source: Ambulatory Visit | Attending: Obstetrics and Gynecology | Admitting: Obstetrics and Gynecology

## 2018-02-23 DIAGNOSIS — Z1231 Encounter for screening mammogram for malignant neoplasm of breast: Secondary | ICD-10-CM

## 2018-03-01 DIAGNOSIS — A630 Anogenital (venereal) warts: Secondary | ICD-10-CM

## 2018-03-01 HISTORY — DX: Anogenital (venereal) warts: A63.0

## 2018-03-09 ENCOUNTER — Encounter (INDEPENDENT_AMBULATORY_CARE_PROVIDER_SITE_OTHER): Payer: 59

## 2018-03-13 ENCOUNTER — Encounter (INDEPENDENT_AMBULATORY_CARE_PROVIDER_SITE_OTHER): Payer: Self-pay | Admitting: Family Medicine

## 2018-03-13 ENCOUNTER — Ambulatory Visit (INDEPENDENT_AMBULATORY_CARE_PROVIDER_SITE_OTHER): Payer: 59 | Admitting: Family Medicine

## 2018-03-13 VITALS — BP 127/84 | HR 85 | Temp 98.1°F | Ht 64.0 in | Wt 264.0 lb

## 2018-03-13 DIAGNOSIS — I10 Essential (primary) hypertension: Secondary | ICD-10-CM

## 2018-03-13 DIAGNOSIS — R5383 Other fatigue: Secondary | ICD-10-CM

## 2018-03-13 DIAGNOSIS — R0602 Shortness of breath: Secondary | ICD-10-CM | POA: Diagnosis not present

## 2018-03-13 DIAGNOSIS — Z1331 Encounter for screening for depression: Secondary | ICD-10-CM

## 2018-03-13 DIAGNOSIS — Z9189 Other specified personal risk factors, not elsewhere classified: Secondary | ICD-10-CM

## 2018-03-13 DIAGNOSIS — Z0289 Encounter for other administrative examinations: Secondary | ICD-10-CM

## 2018-03-13 DIAGNOSIS — E559 Vitamin D deficiency, unspecified: Secondary | ICD-10-CM

## 2018-03-13 DIAGNOSIS — E782 Mixed hyperlipidemia: Secondary | ICD-10-CM

## 2018-03-13 DIAGNOSIS — M1990 Unspecified osteoarthritis, unspecified site: Secondary | ICD-10-CM | POA: Insufficient documentation

## 2018-03-13 DIAGNOSIS — Z6841 Body Mass Index (BMI) 40.0 and over, adult: Secondary | ICD-10-CM

## 2018-03-13 DIAGNOSIS — E66813 Obesity, class 3: Secondary | ICD-10-CM

## 2018-03-13 NOTE — Progress Notes (Signed)
.  Office: [phone]  /  Fax: [phone]   HPI:   Chief Complaint: OBESITY  Alejandra Miller (MR# [id number]) is a 58 y.o. female who presents on 03/13/2018 for obesity evaluation and treatment. Current BMI is Body mass index is 45.32 kg/m. Alejandra Miller has struggled with obesity for years and has been unsuccessful in either losing weight or maintaining long term weight loss. Alejandra Miller attended our information session and states she is currently in the action stage of change and ready to dedicate time achieving and maintaining a healthier weight.  Alejandra Miller states her family eats meals together she thinks maybe her family will eat healthier with her her desired weight loss is 84 lbs she has been heavy most of her life she started gaining weight at 58 years old her heaviest weight ever was 280 lbs. she is a picky eater and doesn't like to eat healthier foods  she has significant food cravings issues  she snacks frequently in the evenings she skips lunch frequently she is frequently drinking liquids with calories she frequently makes poor food choices she frequently eats larger portions than normal  she has binge eating behaviors she struggles with emotional eating    Fatigue Alejandra Miller feels her energy is lower than it should be. This has worsened with weight gain and has not worsened recently. Alejandra Miller admits to daytime somnolence and admits to waking up still tired. Patient is at risk for obstructive sleep apnea. Alejandra Miller has a history of symptoms of daytime fatigue. Patient generally gets 7 or 8 hours of sleep per night, and states they generally have generally restful sleep. Snoring is present. Apneic episodes are not present. Epworth Sleepiness Score is 3  Dyspnea on exertion [name] notes increasing shortness of breath with exercising and seems to be worsening over time with weight gain. She notes getting out of breath sooner with activity than she used to. This has not gotten worse recently. Alejandra Miller denies  orthopnea.  Hyperlipidemia (Mixed) Alejandra Miller has hyperlipidemia and has been trying to improve her cholesterol levels with intensive lifestyle modification including a low saturated fat diet, exercise and weight loss. She is not on a statin and is attempting to diet control.    Hypertension Alejandra Miller is a 58 y.o. female with hypertension. [name]'s blood pressure is stable on medications. She would like to try to diet and work on weight loss to help control her blood pressure with the goal of decreasing her risk of heart attack and stroke. [name] denies chest pain   At risk for cardiovascular disease Alejandra Miller is at a higher than average risk for cardiovascular disease due to hyperlipidemia, hypertension, and obesity. She currently denies any chest pain.  Vitamin D deficiency Alejandra Miller has a diagnosis of vitamin D deficiency. She is currently taking OTC vit D 5,000 IU per day and she admits fatigue.  Depression Screen Alejandra Miller's Food and Mood (modified PHQ-9) score was  Depression screen PHQ 2/9 03/13/2018  Decreased Interest 3  Down, Depressed, Hopeless 3  PHQ - 2 Score 6  Altered sleeping 3  Tired, decreased energy 3  Change in appetite 3  Feeling bad or failure about yourself  2  Trouble concentrating 1  Moving slowly or fidgety/restless 0  Suicidal thoughts 0  PHQ-9 Score 18  Difficult doing work/chores Somewhat difficult    ASSESSMENT AND PLAN:  Other fatigue - Plan: EKG 12-Lead, Vitamin B12, CBC With Differential, Comprehensive metabolic panel, Folate, Hemoglobin A1c, Insulin, random, T3, T4, free, TSH  Shortness of breath  on exertion  Mixed hyperlipidemia - Plan: Lipid Panel With LDL/HDL Ratio  Essential hypertension - Plan: Comprehensive metabolic panel  Vitamin D deficiency - Plan: VITAMIN D 25 Hydroxy (Vit-D Deficiency, Fractures)  Depression screening  At risk for heart disease  Class 3 severe obesity with serious comorbidity and body mass index (BMI) of 45.0 to 49.9 in adult,  unspecified obesity type ([place])  PLAN:  Fatigue Alejandra Miller was informed that her fatigue may be related to obesity, depression or many other causes. Labs will be ordered, and in the meanwhile [name] has agreed to work on diet, exercise and weight loss to help with fatigue. Proper sleep hygiene was discussed including the need for 7-8 hours of quality sleep each night. A sleep study was not ordered based on symptoms and Epworth score. An EKG and an indirect calorimetry was ordered today. [name] agrees to follow up in 2 weeks.  Dyspnea on exertion Alejandra Miller's shortness of breath appears to be obesity related and exercise induced. She has agreed to work on weight loss and gradually increase exercise to treat her exercise induced shortness of breath. If [name] follows our instructions and loses weight without improvement of her shortness of breath, we will plan to refer to pulmonology. We will monitor this condition regularly. We will order fasting labs, an indirect calorimetry, and an EKG today. [name] agrees to this plan.  Hyperlipidemia (Mixed) Alejandra Miller was informed of the American Heart Association Guidelines emphasizing intensive lifestyle modifications as the first line treatment for hyperlipidemia. We discussed many lifestyle modifications today in depth, and [name] will continue to work on decreasing saturated fats such as fatty red meat, butter and many fried foods. She will also increase vegetables and lean protein in her diet and continue to work on exercise and weight loss efforts. We will order fasting labs today and [name] will start her diet plan. She agrees to follow up in 2 weeks.  Hypertension We discussed sodium restriction, working on healthy weight loss, and a regular exercise program as the means to achieve improved blood pressure control. We will continue to monitor her blood pressure as well as her progress with the above lifestyle modifications. She will continue her medications as prescribed and will watch  for signs of hypotension as she continues her lifestyle modifications. We will check labs today and she will start her new diet. Alejandra Miller agreed with this plan and agreed to follow up as directed.  Cardiovascular risk counseling Alejandra Miller was given extended (15 minutes) coronary artery disease prevention counseling today. She is 58 y.o. female and has risk factors for heart disease including hyperlipidemia, hypertension, and obesity. We discussed intensive lifestyle modifications today with an emphasis on specific weight loss instructions and strategies. Pt was also informed of the importance of increasing exercise and decreasing saturated fats to help prevent heart disease.  Vitamin D Deficiency Alejandra Miller was informed that low vitamin D levels contributes to fatigue and are associated with obesity, breast, and colon cancer. She agrees to continue to take OTC Vit D @5 ,000 IU every day and will follow up for routine testing of vitamin D, at least 2-3 times per year. She was informed of the risk of over-replacement of vitamin D and agrees to not increase her dose unless she discusses this with [name] first. Alejandra Miller agrees to follow up as directed.  Depression Screen Alejandra Miller had a strongly positive depression screening. Depression is commonly associated with obesity and often results in emotional eating behaviors. We will monitor this closely and work on  CBT to help improve the non-hunger eating patterns. Referral to Psychology may be required if no improvement is seen as she continues in our clinic.  Obesity Alejandra Miller is currently in the action stage of change and her goal is to continue with weight loss efforts. She has agreed to follow the Category 2 plan + 100 calories. Alejandra Miller has been instructed to work up to a goal of 150 minutes of combined cardio and strengthening exercise per week for weight loss and overall health benefits. We discussed the following Behavioral Modification Strategies today: increasing lean protein intake,  decreasing simple carbohydrates, and work on meal planning and easy cooking plans  Alejandra Miller has agreed to follow up with our clinic in 2 weeks. She was informed of the importance of frequent follow up visits to maximize her success with intensive lifestyle modifications for her multiple health conditions. She was informed we would discuss her lab results at her next visit unless there is a critical issue that needs to be addressed sooner. Alejandra Miller agreed to keep her next visit at the agreed upon time to discuss these results.  ALLERGIES: No Known Allergies  MEDICATIONS: Current Outpatient Medications on File Prior to Visit  Medication Sig Dispense Refill  . CALCIUM PO Take 1 tablet by mouth 2 (two) times daily.     . Cholecalciferol (VITAMIN D PO) Take 5,000 mg by mouth daily.     . Coenzyme Q10 (CO Q 10 PO) Take by mouth daily.    . cyclobenzaprine (FLEXERIL) 10 MG tablet Take 1 tablet by mouth as needed.    . fexofenadine (ALLEGRA) 180 MG tablet Take 180 mg by mouth daily.    Marland Kitchen ibuprofen (ADVIL,MOTRIN) 800 MG tablet Take 1 tablet by mouth as needed.    Marland Kitchen levonorgestrel (MIRENA) 20 MCG/24HR IUD 1 each by Intrauterine route once.    Marland Kitchen lisinopril-hydrochlorothiazide (PRINZIDE,ZESTORETIC) 20-25 MG tablet Take 1 tablet by mouth daily.    Marland Kitchen MAGNESIUM PO Take 500 mg by mouth.    . Melatonin 3 MG TABS Take 1 tablet by mouth at bedtime.    . Multiple Vitamin (MULTIVITAMIN) tablet Take 1 tablet by mouth daily.      . mupirocin cream (BACTROBAN) 2 % Apply 1 application topically as needed. 15 g 1  . Omega-3 Fatty Acids (FISH OIL) 1000 MG CAPS Take 1,000 mg by mouth daily.    Marland Kitchen omeprazole (PRILOSEC) 20 MG capsule Take 20 mg by mouth daily as needed.    Marland Kitchen POTASSIUM PO Take 99 mg by mouth.    . Probiotic Product (PROBIOTIC PO) Take by mouth.    . ST JOHNS WORT PO Take by mouth daily.    . Teriparatide, Recombinant, (FORTEO) 600 MCG/2.4ML SOLN Inject 20 mcg into the skin daily.    Marland Kitchen triamcinolone (NASACORT  ALLERGY 24HR) 55 MCG/ACT AERO nasal inhaler Place 2 sprays into the nose daily.    . Zoledronic Acid (RECLAST IV) Inject into the vein as directed.     No current facility-administered medications on file prior to visit.     PAST MEDICAL HISTORY: Past Medical History:  Diagnosis Date  . Anxiety    NO MEDS  . Arthritis   . Back pain   . Cervical cancer screening 07/20/2012   Menarche at 13, regular til recently No h/o abnormal paps, last pap 2-3 years ago G0P0  MGM today, no h/o abnormal MGMs Spotting now monthly and scant H/o uterine polyps  . Depression    counseling in 2010 for  2 months on medication  . Fibrocystic breast 1988  . Fibroids 2014  . Fracture of right fibula 05/19/2015   October 2016  . GERD (gastroesophageal reflux disease) 2005   treated with omeprazole  . High cholesterol 2002  . History of sinusitis    once/twice per year  . Hyperglycemia 05/19/2015  . Hypertension 2002  . Joint pain   . MRSA (methicillin resistant staph aureus) culture positive 2007  . Muscular pain   . Obesity   . OSA (obstructive sleep apnea)    +sleep study 08/2010., DOES NOT USE CPAP, USES MOSES MOUTH PIECE  . Osteoarthritis    KNEES, HANDS, HIPS  . Osteoporosis   . Plantar fasciitis of left foot 03/2014  . Seasonal allergies    affected in the spring  . Sleep apnea    mouthpiece "[place]"  . Uterine polyp   . 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 Vitamin D deficiency   . Wrist fracture 07/2012   right    PAST SURGICAL HISTORY: Past Surgical History:  Procedure Laterality Date  . DILATATION & CURETTAGE/HYSTEROSCOPY WITH MYOSURE N/A 07/30/2014   Procedure: DILATATION & CURETTAGE/HYSTEROSCOPY WITH MYOSURE;  Surgeon: Nunzio Cobbs, MD;  Location: [facility];  Service: Gynecology;  Laterality: N/A;  extra time for BMI 49.  Marland Kitchen DILATATION & CURRETTAGE/HYSTEROSCOPY WITH RESECTOCOPE N/A 11/24/2012   Procedure: DILATATION & CURETTAGE/HYSTEROSCOPY WITH  RESECTOCOPE;  Surgeon: Arloa Koh, MD;  Location: [facility];  Service: Gynecology;  Laterality: N/A;  . ESSURE TUBAL LIGATION Left   . TONSILLECTOMY  1969  . WISDOM TOOTH EXTRACTION  1979  . WRIST FRACTURE SURGERY Right 07/2012    SOCIAL HISTORY: Social History   Tobacco Use  . Smoking status: Former Smoker    Packs/day: 0.50    Years: 6.00    Pack years: 3.00    Types: Cigarettes    Last attempt to quit: 03/01/1989    Years since quitting: 29.0  . Smokeless tobacco: Never Used  . Tobacco comment: Quit in 1991- started in 1983 1/2ppd  Substance Use Topics  . Alcohol use: No    Alcohol/week: 0.0 standard drinks  . Drug use: No    FAMILY HISTORY: Family History  Problem Relation Age of Onset  . Osteoarthritis Mother   . Hyperlipidemia Mother   . Depression Mother   . Alzheimer's disease Mother   . Hypertension Mother   . Sleep apnea Mother   . Obesity Mother   . Liver cancer Maternal Grandmother        mets to breast  . Breast cancer Maternal Grandmother        liver mets to breast  . Cancer Maternal Grandmother        with mets  . Hyperlipidemia Father   . Coronary artery disease Father   . Hypertension Father   . Stroke Father        multiple  . Diabetes Father   . Obesity Father   . Sleep apnea Father   . Colon cancer Neg Hx     ROS: Review of Systems  Constitutional: Positive for malaise/fatigue. Negative for weight loss.  HENT: Positive for hearing loss.        Positive for stuffiness. Positive for dry mouth. Positive for neck pain. Positive for neck stiffness.  Eyes:       Wears glasses or contacts.  Respiratory: Positive for shortness of breath ( with activity).   Cardiovascular:  Negative for chest pain and orthopnea.  Gastrointestinal: Positive for diarrhea and heartburn.  Musculoskeletal: Positive for back pain, joint pain and neck pain.       Positive for muscle pain. Positive for muscle stiffness.    PHYSICAL EXAM: Blood pressure 127/84,  pulse 85, temperature 98.1 F (36.7 C), temperature source Oral, height 5\' 4"  (1.626 m), weight 264 lb (119.7 kg), SpO2 98 %. Body mass index is 45.32 kg/m. Physical Exam Vitals signs reviewed.  Constitutional:      Appearance: Normal appearance. She is obese.  HENT:     Head: Normocephalic and atraumatic.     Nose: Nose normal.  Eyes:     General: No scleral icterus.    Extraocular Movements: Extraocular movements intact.  Neck:     Musculoskeletal: Normal range of motion and neck supple.     Comments: Negative for thyromegaly. Cardiovascular:     Rate and Rhythm: Normal rate and regular rhythm.     Heart sounds: Murmur present. Systolic ( early) murmur present.  Pulmonary:     Effort: Pulmonary effort is normal. No respiratory distress.  Abdominal:     Palpations: Abdomen is soft.     Tenderness: There is no abdominal tenderness.     Comments: Positive for obesity.  Musculoskeletal:     Comments: ROM normal in all extremities.  Skin:    General: Skin is warm and dry.  Neurological:     Mental Status: She is alert and oriented to person, place, and time.     Coordination: Coordination normal.  Psychiatric:        Mood and Affect: Mood normal.        Behavior: Behavior normal.    RECENT LABS AND TESTS: BMET    Component Value Date/Time   NA 144 06/24/2016 1139   K 4.8 06/24/2016 1139   CL 102 06/24/2016 1139   CO2 26 06/24/2016 1139   GLUCOSE 92 06/24/2016 1139   GLUCOSE 99 05/19/2015 1030   BUN 11 06/24/2016 1139   CREATININE 0.74 06/24/2016 1139   CREATININE 0.91 10/29/2013 0843   CALCIUM 10.2 06/24/2016 1139   GFRNONAA 91 06/24/2016 1139   GFRNONAA >60 06/26/2010 1441   GFRAA 105 06/24/2016 1139   GFRAA >60 06/26/2010 1441   Lab Results  Component Value Date   HGBA1C 5.1 06/24/2016   No results found for: INSULIN CBC    Component Value Date/Time   WBC 5.4 06/24/2016 1139   WBC 9.8 01/02/2016 1439   RBC 5.63 (H) 06/24/2016 1139   RBC 4.92  01/02/2016 1439   HGB 15.1 06/24/2016 1139   HCT 45.1 06/24/2016 1139   PLT 372 06/24/2016 1139   MCV 80 06/24/2016 1139   MCH 26.8 06/24/2016 1139   MCH 28.0 01/02/2016 1439   MCHC 33.5 06/24/2016 1139   MCHC 33.3 01/02/2016 1439   RDW 15.1 06/24/2016 1139   LYMPHSABS 1.8 06/24/2016 1139   MONOABS 0.5 11/22/2014 0809   EOSABS 0.1 06/24/2016 1139   BASOSABS 0.0 06/24/2016 1139   Iron/TIBC/Ferritin/ %Sat No results found for: IRON, TIBC, FERRITIN, IRONPCTSAT Lipid Panel     Component Value Date/Time   CHOL 252 (H) 06/24/2016 1139   TRIG 109 06/24/2016 1139   HDL 34 (L) 06/24/2016 1139   CHOLHDL 7.4 (H) 06/24/2016 1139   CHOLHDL 5 05/19/2015 1030   VLDL 22.2 05/19/2015 1030   LDLCALC 196 (H) 06/24/2016 1139   Hepatic Function Panel     Component Value Date/Time  PROT 7.0 06/24/2016 1139   ALBUMIN 4.7 06/24/2016 1139   AST 29 06/24/2016 1139   ALT 40 (H) 06/24/2016 1139   ALKPHOS 94 06/24/2016 1139   BILITOT 0.7 06/24/2016 1139   BILIDIR 0.1 10/29/2013 0843   IBILI 0.4 10/29/2013 0843      Component Value Date/Time   TSH 1.170 06/24/2016 1139   ECG  shows NSR with a rate of 82 BPM. INDIRECT CALORIMETER done today shows a VO2 of 336 and a REE of 2342. Her calculated basal metabolic rate is 0350 thus her basal metabolic rate is better than expected.   OBESITY BEHAVIORAL INTERVENTION VISIT  Today's visit was # 1   Starting weight: 264 lbs Starting date: 03/13/2018 Today's weight : Weight: 264 lb (119.7 kg)  Today's date: 03/13/2018 Total lbs lost to date: 0  ASK: We discussed the diagnosis of obesity with Alejandra Miller today and Shamila agreed to give [name] permission to discuss obesity behavioral modification therapy today.  ASSESS: Liberti has the diagnosis of obesity and her BMI today is 45.2. Jetaun is in the action stage of change.   ADVISE: Janine was educated on the multiple health risks of obesity as well as the benefit of weight loss to improve her health. She  was advised of the need for long term treatment and the importance of lifestyle modifications to improve her current health and to decrease her risk of future health problems.  AGREE: Multiple dietary modification options and treatment options were discussed and Alejandra Miller agreed to follow the recommendations documented in the above note.  ARRANGE: Gerardine was educated on the importance of frequent visits to treat obesity as outlined per CMS and USPSTF guidelines and agreed to schedule her next follow up appointment today.   I, Marcille Blanco, am acting as transcriptionist for Starlyn Skeans, MD  I have reviewed the above documentation for accuracy and completeness, and I agree with the above. -Dennard Nip, MD

## 2018-03-14 LAB — CBC WITH DIFFERENTIAL
BASOS ABS: 0.1 10*3/uL (ref 0.0–0.2)
BASOS: 1 %
EOS (ABSOLUTE): 0.2 10*3/uL (ref 0.0–0.4)
Eos: 2 %
Hematocrit: 45.8 % (ref 34.0–46.6)
Hemoglobin: 15.3 g/dL (ref 11.1–15.9)
IMMATURE GRANS (ABS): 0.1 10*3/uL (ref 0.0–0.1)
Immature Granulocytes: 1 %
Lymphocytes Absolute: 2.2 10*3/uL (ref 0.7–3.1)
Lymphs: 26 %
MCH: 28.5 pg (ref 26.6–33.0)
MCHC: 33.4 g/dL (ref 31.5–35.7)
MCV: 85 fL (ref 79–97)
Monocytes Absolute: 0.6 10*3/uL (ref 0.1–0.9)
Monocytes: 7 %
NEUTROS PCT: 63 %
Neutrophils Absolute: 5.3 10*3/uL (ref 1.4–7.0)
RBC: 5.36 x10E6/uL — AB (ref 3.77–5.28)
RDW: 13.1 % (ref 11.7–15.4)
WBC: 8.3 10*3/uL (ref 3.4–10.8)

## 2018-03-14 LAB — HEMOGLOBIN A1C
Est. average glucose Bld gHb Est-mCnc: 105 mg/dL
Hgb A1c MFr Bld: 5.3 % (ref 4.8–5.6)

## 2018-03-14 LAB — LIPID PANEL WITH LDL/HDL RATIO
CHOLESTEROL TOTAL: 278 mg/dL — AB (ref 100–199)
HDL: 34 mg/dL — AB (ref >39)
LDL Calculated: 210 mg/dL — ABNORMAL HIGH (ref 0–99)
LDl/HDL Ratio: 6.2 ratio — ABNORMAL HIGH (ref 0.0–3.2)
TRIGLYCERIDES: 171 mg/dL — AB (ref 0–149)
VLDL CHOLESTEROL CAL: 34 mg/dL (ref 5–40)

## 2018-03-14 LAB — COMPREHENSIVE METABOLIC PANEL
ALT: 47 IU/L — AB (ref 0–32)
AST: 29 IU/L (ref 0–40)
Albumin/Globulin Ratio: 1.8 (ref 1.2–2.2)
Albumin: 4.4 g/dL (ref 3.5–5.5)
Alkaline Phosphatase: 65 IU/L (ref 39–117)
BUN / CREAT RATIO: 16 (ref 9–23)
BUN: 13 mg/dL (ref 6–24)
Bilirubin Total: 0.6 mg/dL (ref 0.0–1.2)
CO2: 25 mmol/L (ref 20–29)
Calcium: 9.7 mg/dL (ref 8.7–10.2)
Chloride: 95 mmol/L — ABNORMAL LOW (ref 96–106)
Creatinine, Ser: 0.81 mg/dL (ref 0.57–1.00)
GFR, EST AFRICAN AMERICAN: 93 mL/min/{1.73_m2} (ref >59)
GFR, EST NON AFRICAN AMERICAN: 81 mL/min/{1.73_m2} (ref >59)
GLUCOSE: 93 mg/dL (ref 65–99)
Globulin, Total: 2.5 g/dL (ref 1.5–4.5)
Potassium: 3.6 mmol/L (ref 3.5–5.2)
Sodium: 137 mmol/L (ref 134–144)
TOTAL PROTEIN: 6.9 g/dL (ref 6.0–8.5)

## 2018-03-14 LAB — VITAMIN B12: VITAMIN B 12: 480 pg/mL (ref 232–1245)

## 2018-03-14 LAB — TSH: TSH: 0.91 u[IU]/mL (ref 0.450–4.500)

## 2018-03-14 LAB — FOLATE: FOLATE: 2.8 ng/mL — AB (ref >3.0)

## 2018-03-14 LAB — VITAMIN D 25 HYDROXY (VIT D DEFICIENCY, FRACTURES): VIT D 25 HYDROXY: 23.4 ng/mL — AB (ref 30.0–100.0)

## 2018-03-14 LAB — INSULIN, RANDOM: INSULIN: 23 u[IU]/mL (ref 2.6–24.9)

## 2018-03-14 LAB — T3: T3 TOTAL: 127 ng/dL (ref 71–180)

## 2018-03-14 LAB — T4, FREE: Free T4: 1.23 ng/dL (ref 0.82–1.77)

## 2018-03-20 ENCOUNTER — Encounter (INDEPENDENT_AMBULATORY_CARE_PROVIDER_SITE_OTHER): Payer: Self-pay | Admitting: Family Medicine

## 2018-03-27 ENCOUNTER — Encounter (INDEPENDENT_AMBULATORY_CARE_PROVIDER_SITE_OTHER): Payer: Self-pay | Admitting: Family Medicine

## 2018-03-27 ENCOUNTER — Ambulatory Visit (INDEPENDENT_AMBULATORY_CARE_PROVIDER_SITE_OTHER): Payer: 59 | Admitting: Family Medicine

## 2018-03-27 VITALS — BP 131/92 | HR 92 | Ht 64.0 in | Wt 259.0 lb

## 2018-03-27 DIAGNOSIS — E8881 Metabolic syndrome: Secondary | ICD-10-CM | POA: Diagnosis not present

## 2018-03-27 DIAGNOSIS — E782 Mixed hyperlipidemia: Secondary | ICD-10-CM | POA: Diagnosis not present

## 2018-03-27 DIAGNOSIS — I10 Essential (primary) hypertension: Secondary | ICD-10-CM

## 2018-03-27 DIAGNOSIS — E559 Vitamin D deficiency, unspecified: Secondary | ICD-10-CM

## 2018-03-27 DIAGNOSIS — Z9189 Other specified personal risk factors, not elsewhere classified: Secondary | ICD-10-CM | POA: Diagnosis not present

## 2018-03-27 DIAGNOSIS — Z6841 Body Mass Index (BMI) 40.0 and over, adult: Secondary | ICD-10-CM

## 2018-03-27 MED ORDER — VITAMIN D (ERGOCALCIFEROL) 1.25 MG (50000 UNIT) PO CAPS: 50000.0000 [IU] | ORAL_CAPSULE | ORAL | 0 refills | Status: DC

## 2018-03-27 NOTE — Progress Notes (Signed)
Office: [phone]  /  Fax: [phone]   HPI:   Chief Complaint: OBESITY Alejandra Miller is here to discuss her progress with her obesity treatment plan. She is on the Category 2 plan + 100 calories and is following her eating plan approximately 90 % of the time. She states she is exercising 0 minutes 0 times per week. Alejandra Miller has done well with weight loss on her Category 2 plan. Her hunger was controlled and she struggled to eat all her dinner at times. She notes family sabotage.  Her weight is 259 lb (117.5 kg) today and has had a weight loss of 5 pounds over a period of 2 weeks since her last visit. She has lost 5 lbs since starting treatment with Alejandra Miller.  Hyperlipidemia (Mixed) Pablo has hyperlipidemia and has been trying to improve her cholesterol levels with intensive lifestyle modification including a low saturated fat diet, exercise and weight loss. She has a family history of hyperlipidemia and coronal artery disease. She is not on statin and is at higher risk of coronal artery disease. She denies any chest pain, claudication or myalgias.  Hypertension Alejandra Miller is a 58 y.o. female with hypertension. [name]'s blood pressure is elevated today. She is on lisinopril-hydrochlorothiazide and she denies chest pain. She is working weight loss to help control her blood pressure with the goal of decreasing her risk of heart attack and stroke. Alejandra Miller's blood pressure is not currently controlled.  At risk for cardiovascular disease Alejandra Miller is at a higher than average risk for cardiovascular disease due to obesity, hyperlipidemia, and hypertension. She currently denies any chest pain.  Vitamin D Deficiency Alejandra Miller has a diagnosis of vitamin D deficiency. She is not on Vit D and has a history of osteoporosis, and is on Forteo. She denies nausea, vomiting or muscle weakness.  Insulin Resistance Alejandra Miller has a new diagnosis of insulin resistance based on her elevated fasting insulin level >5. Her glucose and A1c are  normal, but fasting insulin is elevated. Although Alejandra Miller's blood glucose readings are still under good control, insulin resistance puts her at greater risk of metabolic syndrome and diabetes. She is not taking metformin currently and polyphagia improved on Category 2 diet prescription. She continues to work on diet and exercise to decrease risk of diabetes.  ASSESSMENT AND PLAN:  Mixed hyperlipidemia  Vitamin D deficiency - Plan: Vitamin D, Ergocalciferol, (DRISDOL) 1.25 MG (50000 UT) CAPS capsule  Insulin resistance  Essential hypertension  At risk for heart disease  Class 3 severe obesity with serious comorbidity and body mass index (BMI) of 40.0 to 44.9 in adult, unspecified obesity type ([place])  PLAN:  Hyperlipidemia (Mixed) Alejandra Miller was informed of the American Heart Association Guidelines emphasizing intensive lifestyle modifications as the first line treatment for hyperlipidemia. We discussed many lifestyle modifications today in depth, and Alejandra Miller will continue to work on decreasing saturated fats such as fatty red meat, butter and many fried foods. She will also increase vegetables and lean protein in her diet and continue to work on diet, exercise, and weight loss efforts. We will recheck labs in 3 months, unless there is significant improvement, will likely need to start a statin. Alejandra Miller agrees to follow up with our clinic in 2 weeks with Alejandra Bathe, FNP  Hypertension We discussed sodium restriction, working on healthy weight loss, and a regular exercise program as the means to achieve improved blood pressure control. [name] agreed with this plan and agreed to follow up as directed. We will continue to  monitor her blood pressure as well as her progress with the above lifestyle modifications. She will continue her medications, diet, and weight loss and will watch for signs of hypotension as she continues her lifestyle modifications. We will recheck blood pressure in 2 weeks, if it is still  elevated, may have to adjust or add medications. Alejandra Miller agrees to follow up with our clinic in 2 weeks with Alejandra Miller, [place].  Cardiovascular risk counselling Alejandra Miller was given extended (15 minutes) coronary artery disease prevention counseling today. She is 59 y.o. female and has risk factors for heart disease including obesity, hyperlipidemia, and hypertension. We discussed intensive lifestyle modifications today with an emphasis on specific weight loss instructions and strategies. Pt was also informed of the importance of increasing exercise and decreasing saturated fats to help prevent heart disease.  Vitamin D Deficiency Alejandra Miller was informed that low vitamin D levels contributes to fatigue and are associated with obesity, breast, and colon cancer. Alejandra Miller agrees to start prescription Vit D @50 ,000 IU every week #4 with no refills. She will follow up for routine testing of vitamin D, at least 2-3 times per year. She was informed of the risk of over-replacement of vitamin D and agrees to not increase her dose unless she discusses this with Alejandra Miller first. We will recheck labs in 3 months. Alejandra Miller agrees to follow up with our clinic in 2 weeks with Alejandra Miller, [place].  Insulin Resistance Alejandra Miller will continue to work on weight loss, diet, exercise, and decreasing simple carbohydrates in her diet to help decrease the risk of diabetes. We dicussed metformin including benefits and risks. She was informed that eating too many simple carbohydrates or too many calories at one sitting increases the likelihood of GI side effects. Alejandra Miller declined metformin for now and prescription was not written today. We will recheck labs in 3 months. Alejandra Miller agrees to follow up with our clinic in 2 weeks with Alejandra Bathe, FNP as directed to monitor her progress.  Obesity Alejandra Miller is currently in the action stage of change. As such, her goal is to continue with weight loss efforts She has agreed to follow the Category 2 plan + 100 calories Alejandra Miller has  been instructed to work up to a goal of 150 minutes of combined cardio and strengthening exercise per week for weight loss and overall health benefits. We discussed the following Behavioral Modification Strategies today: increasing lean protein intake, decreasing simple carbohydrates , increasing vegetables, work on meal planning and easy cooking plans and dealing with family or coworker sabotage   Naomie has agreed to follow up with our clinic in 2 weeks with Alejandra Miller, [place]. She was informed of the importance of frequent follow up visits to maximize her success with intensive lifestyle modifications for her multiple health conditions.  ALLERGIES: No Known Allergies  MEDICATIONS: Current Outpatient Medications on File Prior to Visit  Medication Sig Dispense Refill  . CALCIUM PO Take 1 tablet by mouth 2 (two) times daily.     . Cholecalciferol (VITAMIN D PO) Take 5,000 mg by mouth daily.     . Coenzyme Q10 (CO Q 10 PO) Take by mouth daily.    . cyclobenzaprine (FLEXERIL) 10 MG tablet Take 1 tablet by mouth as needed.    . fexofenadine (ALLEGRA) 180 MG tablet Take 180 mg by mouth daily.    Marland Kitchen ibuprofen (ADVIL,MOTRIN) 800 MG tablet Take 1 tablet by mouth as needed.    Marland Kitchen levonorgestrel (MIRENA) 20 MCG/24HR IUD 1 each by Intrauterine route  once.    . lisinopril-hydrochlorothiazide (PRINZIDE,ZESTORETIC) 20-25 MG tablet Take 1 tablet by mouth daily.    Marland Kitchen MAGNESIUM PO Take 500 mg by mouth.    . Melatonin 3 MG TABS Take 1 tablet by mouth at bedtime.    . Multiple Vitamin (MULTIVITAMIN) tablet Take 1 tablet by mouth daily.      . mupirocin cream (BACTROBAN) 2 % Apply 1 application topically as needed. 15 g 1  . Omega-3 Fatty Acids (FISH OIL) 1000 MG CAPS Take 1,000 mg by mouth daily.    Marland Kitchen omeprazole (PRILOSEC) 20 MG capsule Take 20 mg by mouth daily as needed.    Marland Kitchen POTASSIUM PO Take 99 mg by mouth.    . Probiotic Product (PROBIOTIC PO) Take by mouth.    . ST JOHNS WORT PO Take by mouth daily.      . Teriparatide, Recombinant, (FORTEO) 600 MCG/2.4ML SOLN Inject 20 mcg into the skin daily.    Marland Kitchen triamcinolone (NASACORT ALLERGY 24HR) 55 MCG/ACT AERO nasal inhaler Place 2 sprays into the nose daily.    . Zoledronic Acid (RECLAST IV) Inject into the vein as directed.     No current facility-administered medications on file prior to visit.     PAST MEDICAL HISTORY: Past Medical History:  Diagnosis Date  . Anxiety    NO MEDS  . Arthritis   . Back pain   . Cervical cancer screening 07/20/2012   Menarche at 13, regular til recently No h/o abnormal paps, last pap 2-3 years ago G0P0  MGM today, no h/o abnormal MGMs Spotting now monthly and scant H/o uterine polyps  . Depression    counseling in 2010 for 2 months on medication  . Fibrocystic breast 1988  . Fibroids 2014  . Fracture of right fibula 05/19/2015   October 2016  . GERD (gastroesophageal reflux disease) 2005   treated with omeprazole  . High cholesterol 2002  . History of sinusitis    once/twice per year  . Hyperglycemia 05/19/2015  . Hypertension 2002  . Joint pain   . MRSA (methicillin resistant staph aureus) culture positive 2007  . Muscular pain   . Obesity   . OSA (obstructive sleep apnea)    +sleep study 08/2010., DOES NOT USE CPAP, USES MOSES MOUTH PIECE  . Osteoarthritis    KNEES, HANDS, HIPS  . Osteoporosis   . Plantar fasciitis of left foot 03/2014  . Seasonal allergies    affected in the spring  . Sleep apnea    mouthpiece "[place]"  . Uterine polyp   . 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 Vitamin D deficiency   . Wrist fracture 07/2012   right    PAST SURGICAL HISTORY: Past Surgical History:  Procedure Laterality Date  . DILATATION & CURETTAGE/HYSTEROSCOPY WITH MYOSURE N/A 07/30/2014   Procedure: DILATATION & CURETTAGE/HYSTEROSCOPY WITH MYOSURE;  Surgeon: Nunzio Cobbs, MD;  Location: [facility];  Service: Gynecology;  Laterality: N/A;  extra time for BMI 49.  Marland Kitchen  DILATATION & CURRETTAGE/HYSTEROSCOPY WITH RESECTOCOPE N/A 11/24/2012   Procedure: DILATATION & CURETTAGE/HYSTEROSCOPY WITH RESECTOCOPE;  Surgeon: Arloa Koh, MD;  Location: [facility];  Service: Gynecology;  Laterality: N/A;  . ESSURE TUBAL LIGATION Left   . TONSILLECTOMY  1969  . WISDOM TOOTH EXTRACTION  1979  . WRIST FRACTURE SURGERY Right 07/2012    SOCIAL HISTORY: Social History   Tobacco Use  . Smoking status: Former Smoker    Packs/day:  0.50    Years: 6.00    Pack years: 3.00    Types: Cigarettes    Last attempt to quit: 03/01/1989    Years since quitting: 29.0  . Smokeless tobacco: Never Used  . Tobacco comment: Quit in 1991- started in 1983 1/2ppd  Substance Use Topics  . Alcohol use: No    Alcohol/week: 0.0 standard drinks  . Drug use: No    FAMILY HISTORY: Family History  Problem Relation Age of Onset  . Osteoarthritis Mother   . Hyperlipidemia Mother   . Depression Mother   . Alzheimer's disease Mother   . Hypertension Mother   . Sleep apnea Mother   . Obesity Mother   . Liver cancer Maternal Grandmother        mets to breast  . Breast cancer Maternal Grandmother        liver mets to breast  . Cancer Maternal Grandmother        with mets  . Hyperlipidemia Father   . Coronary artery disease Father   . Hypertension Father   . Stroke Father        multiple  . Diabetes Father   . Obesity Father   . Sleep apnea Father   . Colon cancer Neg Hx     ROS: Review of Systems  Constitutional: Positive for weight loss.  Cardiovascular: Negative for chest pain and claudication.  Gastrointestinal: Negative for nausea and vomiting.  Musculoskeletal: Negative for myalgias.       Negative muscle weakness  Endo/Heme/Allergies:       Negative polyphagia    PHYSICAL EXAM: Blood pressure (!) 131/92, pulse 92, height 5\' 4"  (1.626 m), weight 259 lb (117.5 kg), SpO2 99 %. Body mass index is 44.46 kg/m. Physical Exam Vitals signs reviewed.  Constitutional:       Appearance: Normal appearance. She is obese.  Cardiovascular:     Rate and Rhythm: Normal rate.     Pulses: Normal pulses.  Pulmonary:     Effort: Pulmonary effort is normal.     Breath sounds: Normal breath sounds.  Musculoskeletal: Normal range of motion.  Skin:    General: Skin is warm and dry.  Neurological:     Mental Status: She is alert and oriented to person, place, and time.  Psychiatric:        Mood and Affect: Mood normal.        Behavior: Behavior normal.     RECENT LABS AND TESTS: BMET    Component Value Date/Time   NA 137 03/13/2018 1250   K 3.6 03/13/2018 1250   CL 95 (L) 03/13/2018 1250   CO2 25 03/13/2018 1250   GLUCOSE 93 03/13/2018 1250   GLUCOSE 99 05/19/2015 1030   BUN 13 03/13/2018 1250   CREATININE 0.81 03/13/2018 1250   CREATININE 0.91 10/29/2013 0843   CALCIUM 9.7 03/13/2018 1250   GFRNONAA 81 03/13/2018 1250   GFRNONAA >60 06/26/2010 1441   GFRAA 93 03/13/2018 1250   GFRAA >60 06/26/2010 1441   Lab Results  Component Value Date   HGBA1C 5.3 03/13/2018   HGBA1C 5.1 06/24/2016   HGBA1C 5.5 05/19/2015   Lab Results  Component Value Date   INSULIN 23.0 03/13/2018   CBC    Component Value Date/Time   WBC 8.3 03/13/2018 1250   WBC 9.8 01/02/2016 1439   RBC 5.36 (H) 03/13/2018 1250   RBC 4.92 01/02/2016 1439   HGB 15.3 03/13/2018 1250   HCT 45.8 03/13/2018 1250  PLT 372 06/24/2016 1139   MCV 85 03/13/2018 1250   MCH 28.5 03/13/2018 1250   MCH 28.0 01/02/2016 1439   MCHC 33.4 03/13/2018 1250   MCHC 33.3 01/02/2016 1439   RDW 13.1 03/13/2018 1250   LYMPHSABS 2.2 03/13/2018 1250   MONOABS 0.5 11/22/2014 0809   EOSABS 0.2 03/13/2018 1250   BASOSABS 0.1 03/13/2018 1250   Iron/TIBC/Ferritin/ %Sat No results found for: IRON, TIBC, FERRITIN, IRONPCTSAT Lipid Panel     Component Value Date/Time   CHOL 278 (H) 03/13/2018 1250   TRIG 171 (H) 03/13/2018 1250   HDL 34 (L) 03/13/2018 1250   CHOLHDL 7.4 (H) 06/24/2016 1139   CHOLHDL 5  05/19/2015 1030   VLDL 22.2 05/19/2015 1030   LDLCALC 210 (H) 03/13/2018 1250   Hepatic Function Panel     Component Value Date/Time   PROT 6.9 03/13/2018 1250   ALBUMIN 4.4 03/13/2018 1250   AST 29 03/13/2018 1250   ALT 47 (H) 03/13/2018 1250   ALKPHOS 65 03/13/2018 1250   BILITOT 0.6 03/13/2018 1250   BILIDIR 0.1 10/29/2013 0843   IBILI 0.4 10/29/2013 0843      Component Value Date/Time   TSH 0.910 03/13/2018 1250   TSH 1.170 06/24/2016 1139   TSH 1.28 11/22/2014 0809      OBESITY BEHAVIORAL INTERVENTION VISIT  Today's visit was # 2   Starting weight: 264 lbs Starting date: 03/13/2018 Today's weight : 259 lbs  Today's date: 03/27/2018 Total lbs lost to date: 5    ASK: We discussed the diagnosis of obesity with [name] today and Kemara agreed to give Alejandra Miller permission to discuss obesity behavioral modification therapy today.  ASSESS: Sabena has the diagnosis of obesity and her BMI today is 44.44 Jozy is in the action stage of change   ADVISE: Yoshika was educated on the multiple health risks of obesity as well as the benefit of weight loss to improve her health. She was advised of the need for long term treatment and the importance of lifestyle modifications to improve her current health and to decrease her risk of future health problems.  AGREE: Multiple dietary modification options and treatment options were discussed and  Viktoria agreed to follow the recommendations documented in the above note.  ARRANGE: Saprina was educated on the importance of frequent visits to treat obesity as outlined per CMS and USPSTF guidelines and agreed to schedule her next follow up appointment today.  I, Trixie Dredge, am acting as transcriptionist for Dennard Nip, MD  I have reviewed the above documentation for accuracy and completeness, and I agree with the above. -Dennard Nip, MD

## 2018-03-30 ENCOUNTER — Encounter (INDEPENDENT_AMBULATORY_CARE_PROVIDER_SITE_OTHER): Payer: Self-pay | Admitting: Family Medicine

## 2018-04-10 ENCOUNTER — Encounter (INDEPENDENT_AMBULATORY_CARE_PROVIDER_SITE_OTHER): Payer: Self-pay | Admitting: Family Medicine

## 2018-04-10 ENCOUNTER — Ambulatory Visit (INDEPENDENT_AMBULATORY_CARE_PROVIDER_SITE_OTHER): Payer: 59 | Admitting: Family Medicine

## 2018-04-10 VITALS — BP 136/82 | HR 93 | Temp 97.8°F | Ht 64.0 in | Wt 256.0 lb

## 2018-04-10 DIAGNOSIS — E8881 Metabolic syndrome: Secondary | ICD-10-CM | POA: Diagnosis not present

## 2018-04-10 DIAGNOSIS — Z6841 Body Mass Index (BMI) 40.0 and over, adult: Secondary | ICD-10-CM

## 2018-04-10 DIAGNOSIS — E66813 Obesity, class 3: Secondary | ICD-10-CM

## 2018-04-10 DIAGNOSIS — Z9189 Other specified personal risk factors, not elsewhere classified: Secondary | ICD-10-CM | POA: Diagnosis not present

## 2018-04-10 DIAGNOSIS — E559 Vitamin D deficiency, unspecified: Secondary | ICD-10-CM

## 2018-04-10 DIAGNOSIS — E88819 Insulin resistance, unspecified: Secondary | ICD-10-CM

## 2018-04-10 MED ORDER — VITAMIN D (ERGOCALCIFEROL) 1.25 MG (50000 UNIT) PO CAPS: 50000.0000 [IU] | ORAL_CAPSULE | ORAL | 0 refills | Status: DC

## 2018-04-10 NOTE — Progress Notes (Signed)
Office: [phone]  /  Fax: [phone]   HPI:   Chief Complaint: OBESITY Alejandra Miller is here to discuss her progress with her obesity treatment plan. She is on the Category 2 plan + 100 calories and is following her eating plan approximately 85 % of the time. She states she is exercising 0 minutes 0 times per week. Alejandra Miller is currently having trouble getting in all her meat. She does not care for sandwiches at lunch. She reports it is easier to stick to the plan on work days. She notes boredom eating on the weekends. She reports eating on the plan reduces reflux.  Her weight is 256 lb (116.1 kg) today and has had a weight loss of 3 pounds over a period of 2 weeks since her last visit. She has lost 8 lbs since starting treatment with [name].  Vitamin D deficiency Alejandra Miller has a diagnosis of vitamin D deficiency. She is currently taking vit D and her last level was 23.4 on 03/13/18, which is not at goal. She denies nausea, vomiting, fatigue, or muscle weakness.  At risk for osteopenia and osteoporosis Alejandra Miller is at higher risk of osteopenia and osteoporosis due to vitamin D deficiency.   Insulin Resistance Alejandra Miller has a diagnosis of insulin resistance based on her elevated fasting insulin level >5. Although Alejandra Miller's blood glucose readings are still under good control, insulin resistance puts her at greater risk of metabolic syndrome and diabetes. She is not taking metformin currently and continues to work on diet and exercise to decrease risk of diabetes. Alejandra Miller denies polyphagia.  ASSESSMENT AND PLAN:  Vitamin D deficiency - Plan: Vitamin D, Ergocalciferol, (DRISDOL) 1.25 MG (50000 UT) CAPS capsule  Insulin resistance  At risk for osteoporosis  Class 3 severe obesity with serious comorbidity and body mass index (BMI) of 40.0 to 44.9 in adult, unspecified obesity type ([place])  PLAN:  Vitamin D Deficiency Alejandra Miller was informed that low vitamin D levels contributes to fatigue and are associated with obesity,  breast, and colon cancer. She agrees to continue to take prescription Vit D @50 ,000 IU every week #4 with no refills and will follow up for routine testing of vitamin D, at least 2-3 times per year. She was informed of the risk of over-replacement of vitamin D and agrees to not increase her dose unless she discusses this with [name] first. Alejandra Miller agrees to follow up in 2 weeks.  At risk for osteopenia and osteoporosis Alejandra Miller was given extended (15 minutes) osteoporosis prevention counseling today. Alejandra Miller is at risk for osteopenia and osteoporosis due to her vitamin D deficiency. She was encouraged to take her vitamin D and follow her higher calcium diet and increase strengthening exercise to help strengthen her bones and decrease her risk of osteopenia and osteoporosis.  Insulin Resistance Alejandra Miller will continue to work on weight loss, exercise, and decreasing simple carbohydrates in her diet to help decrease the risk of diabetes. We will check a fasting Insulin in 2 to 3 months.  Alejandra Miller agreed to follow up with [name] as directed to monitor her progress.  Obesity Alejandra Miller is currently in the action stage of change. As such, her goal is to continue with weight loss efforts. She has agreed to follow the Category 2 plan + 100 calories. We discussed protein exchanges for meat. We discussed the following Behavioral Modification Strategies today: increasing lean protein intake, increase H2O intake, better snacking choices, planning for success, and celebration eating strategies.  Alejandra Miller has not been prescribed exercise at this time.  Alejandra Miller has agreed to follow up with our clinic in 2 weeks. She was informed of the importance of frequent follow up visits to maximize her success with intensive lifestyle modifications for her multiple health conditions.  ALLERGIES: No Known Allergies  MEDICATIONS: Current Outpatient Medications on File Prior to Visit  Medication Sig Dispense Refill  . CALCIUM PO Take 1 tablet by mouth 2 (two)  times daily.     . Cholecalciferol (VITAMIN D PO) Take 5,000 mg by mouth daily.     . Coenzyme Q10 (CO Q 10 PO) Take by mouth daily.    . cyclobenzaprine (FLEXERIL) 10 MG tablet Take 1 tablet by mouth as needed.    . fexofenadine (ALLEGRA) 180 MG tablet Take 180 mg by mouth daily.    Alejandra Miller Kitchen ibuprofen (ADVIL,MOTRIN) 800 MG tablet Take 1 tablet by mouth as needed.    Alejandra Miller Kitchen levonorgestrel (MIRENA) 20 MCG/24HR IUD 1 each by Intrauterine route once.    Alejandra Miller Kitchen lisinopril-hydrochlorothiazide (PRINZIDE,ZESTORETIC) 20-25 MG tablet Take 1 tablet by mouth daily.    Alejandra Miller Kitchen MAGNESIUM PO Take 500 mg by mouth.    . Melatonin 3 MG TABS Take 1 tablet by mouth at bedtime.    . Multiple Vitamin (MULTIVITAMIN) tablet Take 1 tablet by mouth daily.      . mupirocin cream (BACTROBAN) 2 % Apply 1 application topically as needed. 15 g 1  . Omega-3 Fatty Acids (FISH OIL) 1000 MG CAPS Take 1,000 mg by mouth daily.    Alejandra Miller Kitchen omeprazole (PRILOSEC) 20 MG capsule Take 20 mg by mouth daily as needed.    Alejandra Miller Kitchen POTASSIUM PO Take 99 mg by mouth.    . Probiotic Product (PROBIOTIC PO) Take by mouth.    . [place] WORT PO Take by mouth daily.    . Teriparatide, Recombinant, (FORTEO) 600 MCG/2.4ML SOLN Inject 20 mcg into the skin daily.    Alejandra Miller Kitchen triamcinolone (NASACORT ALLERGY 24HR) 55 MCG/ACT AERO nasal inhaler Place 2 sprays into the nose daily.    . Zoledronic Acid (RECLAST IV) Inject into the vein as directed.     No current facility-administered medications on file prior to visit.     PAST MEDICAL HISTORY: Past Medical History:  Diagnosis Date  . Anxiety    NO MEDS  . Arthritis   . Back pain   . Cervical cancer screening 07/20/2012   Menarche at 13, regular til recently No h/o abnormal paps, last pap 2-3 years ago G0P0  MGM today, no h/o abnormal MGMs Spotting now monthly and scant H/o uterine polyps  . Depression    counseling in 2010 for 2 months on medication  . Fibrocystic breast 1988  . Fibroids 2014  . Fracture of right fibula 05/19/2015    October 2016  . GERD (gastroesophageal reflux disease) 2005   treated with omeprazole  . High cholesterol 2002  . History of sinusitis    once/twice per year  . Hyperglycemia 05/19/2015  . Hypertension 2002  . Joint pain   . MRSA (methicillin resistant staph aureus) culture positive 2007  . Muscular pain   . Obesity   . OSA (obstructive sleep apnea)    +sleep study 08/2010., DOES NOT USE CPAP, USES MOSES MOUTH PIECE  . Osteoarthritis    KNEES, HANDS, HIPS  . Osteoporosis   . Plantar fasciitis of left foot 03/2014  . Seasonal allergies    affected in the spring  . Sleep apnea    mouthpiece "[place]"  . Uterine polyp   .  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 Vitamin D deficiency   . Wrist fracture 07/2012   right    PAST SURGICAL HISTORY: Past Surgical History:  Procedure Laterality Date  . DILATATION & CURETTAGE/HYSTEROSCOPY WITH MYOSURE N/A 07/30/2014   Procedure: DILATATION & CURETTAGE/HYSTEROSCOPY WITH MYOSURE;  Surgeon: Nunzio Cobbs, MD;  Location: [facility];  Service: Gynecology;  Laterality: N/A;  extra time for BMI 49.  Alejandra Miller Kitchen DILATATION & CURRETTAGE/HYSTEROSCOPY WITH RESECTOCOPE N/A 11/24/2012   Procedure: DILATATION & CURETTAGE/HYSTEROSCOPY WITH RESECTOCOPE;  Surgeon: Arloa Koh, MD;  Location: [facility];  Service: Gynecology;  Laterality: N/A;  . ESSURE TUBAL LIGATION Left   . TONSILLECTOMY  1969  . WISDOM TOOTH EXTRACTION  1979  . WRIST FRACTURE SURGERY Right 07/2012    SOCIAL HISTORY: Social History   Tobacco Use  . Smoking status: Former Smoker    Packs/day: 0.50    Years: 6.00    Pack years: 3.00    Types: Cigarettes    Last attempt to quit: 03/01/1989    Years since quitting: 29.1  . Smokeless tobacco: Never Used  . Tobacco comment: Quit in 1991- started in 1983 1/2ppd  Substance Use Topics  . Alcohol use: No    Alcohol/week: 0.0 standard drinks  . Drug use: No    FAMILY HISTORY: Family History  Problem Relation Age  of Onset  . Osteoarthritis Mother   . Hyperlipidemia Mother   . Depression Mother   . Alzheimer's disease Mother   . Hypertension Mother   . Sleep apnea Mother   . Obesity Mother   . Liver cancer Maternal Grandmother        mets to breast  . Breast cancer Maternal Grandmother        liver mets to breast  . Cancer Maternal Grandmother        with mets  . Hyperlipidemia Father   . Coronary artery disease Father   . Hypertension Father   . Stroke Father        multiple  . Diabetes Father   . Obesity Father   . Sleep apnea Father   . Colon cancer Neg Hx     ROS: Review of Systems  Constitutional: Positive for weight loss. Negative for malaise/fatigue.  Gastrointestinal: Negative for nausea and vomiting.  Musculoskeletal:       Negative for muscle weakness.  Endo/Heme/Allergies:       Negative for polyphagia.    PHYSICAL EXAM: Blood pressure 136/82, pulse 93, temperature 97.8 F (36.6 C), temperature source Oral, height 5\' 4"  (1.626 m), weight 256 lb (116.1 kg), SpO2 97 %. Body mass index is 43.94 kg/m. Physical Exam Vitals signs reviewed.  Constitutional:      Appearance: Normal appearance. She is obese.  Cardiovascular:     Rate and Rhythm: Normal rate.  Pulmonary:     Effort: Pulmonary effort is normal.  Musculoskeletal: Normal range of motion.  Skin:    General: Skin is warm and dry.  Neurological:     Mental Status: She is alert and oriented to person, place, and time.  Psychiatric:        Mood and Affect: Mood normal.        Behavior: Behavior normal.     RECENT LABS AND TESTS: BMET    Component Value Date/Time   NA 137 03/13/2018 1250   K 3.6 03/13/2018 1250   CL 95 (L) 03/13/2018 1250   CO2 25 03/13/2018  1250   GLUCOSE 93 03/13/2018 1250   GLUCOSE 99 05/19/2015 1030   BUN 13 03/13/2018 1250   CREATININE 0.81 03/13/2018 1250   CREATININE 0.91 10/29/2013 0843   CALCIUM 9.7 03/13/2018 1250   GFRNONAA 81 03/13/2018 1250   GFRNONAA >60  06/26/2010 1441   GFRAA 93 03/13/2018 1250   GFRAA >60 06/26/2010 1441   Lab Results  Component Value Date   HGBA1C 5.3 03/13/2018   HGBA1C 5.1 06/24/2016   HGBA1C 5.5 05/19/2015   Lab Results  Component Value Date   INSULIN 23.0 03/13/2018   CBC    Component Value Date/Time   WBC 8.3 03/13/2018 1250   WBC 9.8 01/02/2016 1439   RBC 5.36 (H) 03/13/2018 1250   RBC 4.92 01/02/2016 1439   HGB 15.3 03/13/2018 1250   HCT 45.8 03/13/2018 1250   PLT 372 06/24/2016 1139   MCV 85 03/13/2018 1250   MCH 28.5 03/13/2018 1250   MCH 28.0 01/02/2016 1439   MCHC 33.4 03/13/2018 1250   MCHC 33.3 01/02/2016 1439   RDW 13.1 03/13/2018 1250   LYMPHSABS 2.2 03/13/2018 1250   MONOABS 0.5 11/22/2014 0809   EOSABS 0.2 03/13/2018 1250   BASOSABS 0.1 03/13/2018 1250   Iron/TIBC/Ferritin/ %Sat No results found for: IRON, TIBC, FERRITIN, IRONPCTSAT Lipid Panel     Component Value Date/Time   CHOL 278 (H) 03/13/2018 1250   TRIG 171 (H) 03/13/2018 1250   HDL 34 (L) 03/13/2018 1250   CHOLHDL 7.4 (H) 06/24/2016 1139   CHOLHDL 5 05/19/2015 1030   VLDL 22.2 05/19/2015 1030   LDLCALC 210 (H) 03/13/2018 1250   Hepatic Function Panel     Component Value Date/Time   PROT 6.9 03/13/2018 1250   ALBUMIN 4.4 03/13/2018 1250   AST 29 03/13/2018 1250   ALT 47 (H) 03/13/2018 1250   ALKPHOS 65 03/13/2018 1250   BILITOT 0.6 03/13/2018 1250   BILIDIR 0.1 10/29/2013 0843   IBILI 0.4 10/29/2013 0843      Component Value Date/Time   TSH 0.910 03/13/2018 1250   TSH 1.170 06/24/2016 1139   TSH 1.28 11/22/2014 0809   Results for MACEE, VENABLES (MRN [id number]) as of 04/10/2018 10:52  Ref. Range 03/13/2018 12:50  Vitamin D, 25-Hydroxy Latest Ref Range: 30.0 - 100.0 ng/mL 23.4 (L)    OBESITY BEHAVIORAL INTERVENTION VISIT  Today's visit was # 3   Starting weight: 264 lbs Starting date: 03/13/2018 Today's weight : Weight: 256 lb (116.1 kg)  Today's date: 04/10/2018 Total lbs lost to date: 8  ASK: We  discussed the diagnosis of obesity with Alejandra Miller today and Alejandra Miller agreed to give [name] permission to discuss obesity behavioral modification therapy today.  ASSESS: Alejandra Miller has the diagnosis of obesity and her BMI today is 43.9. Alejandra Miller is in the action stage of change.   ADVISE: Alejandra Miller was educated on the multiple health risks of obesity as well as the benefit of weight loss to improve her health. She was advised of the need for long term treatment and the importance of lifestyle modifications to improve her current health and to decrease her risk of future health problems.  AGREE: Multiple dietary modification options and treatment options were discussed and Alejandra Miller agreed to follow the recommendations documented in the above note.  ARRANGE: Alejandra Miller was educated on the importance of frequent visits to treat obesity as outlined per CMS and USPSTF guidelines and agreed to schedule her next follow up appointment today.  IMarcille Blanco, CMA,  am acting as Location manager for [employer], FNP-C.  I have reviewed the above documentation for accuracy and completeness, and I agree with the above.  -  , FNP-C.

## 2018-04-11 ENCOUNTER — Ambulatory Visit (INDEPENDENT_AMBULATORY_CARE_PROVIDER_SITE_OTHER): Payer: 59 | Admitting: Family Medicine

## 2018-04-14 ENCOUNTER — Encounter (INDEPENDENT_AMBULATORY_CARE_PROVIDER_SITE_OTHER): Payer: Self-pay | Admitting: Family Medicine

## 2018-04-17 ENCOUNTER — Other Ambulatory Visit (INDEPENDENT_AMBULATORY_CARE_PROVIDER_SITE_OTHER): Payer: Self-pay

## 2018-04-17 DIAGNOSIS — E559 Vitamin D deficiency, unspecified: Secondary | ICD-10-CM

## 2018-04-17 MED ORDER — VITAMIN D (ERGOCALCIFEROL) 1.25 MG (50000 UNIT) PO CAPS: 50000.0000 [IU] | ORAL_CAPSULE | ORAL | 0 refills | Status: DC

## 2018-04-24 ENCOUNTER — Ambulatory Visit (INDEPENDENT_AMBULATORY_CARE_PROVIDER_SITE_OTHER): Payer: 59 | Admitting: Family Medicine

## 2018-04-24 ENCOUNTER — Encounter (INDEPENDENT_AMBULATORY_CARE_PROVIDER_SITE_OTHER): Payer: Self-pay | Admitting: Family Medicine

## 2018-04-24 VITALS — BP 145/93 | HR 91 | Temp 98.1°F | Ht 64.0 in | Wt 251.0 lb

## 2018-04-24 DIAGNOSIS — I10 Essential (primary) hypertension: Secondary | ICD-10-CM | POA: Diagnosis not present

## 2018-04-24 DIAGNOSIS — Z6841 Body Mass Index (BMI) 40.0 and over, adult: Secondary | ICD-10-CM | POA: Diagnosis not present

## 2018-04-24 NOTE — Progress Notes (Signed)
Office: [phone]  /  Fax: [phone]   HPI:   Chief Complaint: OBESITY Alejandra Miller is here to discuss her progress with her obesity treatment plan. She is on the Category 2 plan and is following her eating plan approximately 85 % of the time. She states she is exercising 0 minutes 0 times per week. Daneisha is not eating all protein at dinner. She also does not eat all of breakfast. Her weight is 251 lb (113.9 kg) today and has had a weight loss of 5 pounds over a period of 2 weeks since her last visit. She has lost 11 lbs since starting treatment with [name].  Hypertension Alejandra Miller is a 58 y.o. female with hypertension. Ritas blood pressure is elevated today. Alejandra Miller denies chest pain or shortness of breath. She is working on weight loss to help control her blood pressure with the goal of decreasing her risk of heart attack and stroke.  She has not taken her medication today. She denies chest pain or shortness of breath. Her blood pressure is usually well controlled.  ASSESSMENT AND PLAN:  Essential hypertension  Class 3 severe obesity with serious comorbidity and body mass index (BMI) of 40.0 to 44.9 in adult, unspecified obesity type ([place])  PLAN:  Hypertension We discussed sodium restriction, working on healthy weight loss, and a regular exercise program as the means to achieve improved blood pressure control. We will continue to monitor her blood pressure as well as her progress with the above lifestyle modifications. Alejandra Miller agrees to be compliant and continue taking her medication. She will watch for signs of hypotension as she continues her lifestyle modifications.[name] agrees to follow up with our clinic in 2 weeks.  I spent > than 50% of the 15 minute visit on counseling as documented in the note.  Obesity Alejandra Miller is currently in the action stage of change. As such, her goal is to continue with weight loss efforts She has agreed to follow the Category 2 plan + 100 calories  Alejandra Miller  has not been prescribed exercise at this time.   We discussed the following Behavioral Modification Strategies today: work on meal planning and easy cooking plans and planning for success  [name] has agreed to follow up with our clinic in 2 weeks. She was informed of the importance of frequent follow up visits to maximize her success with intensive lifestyle modifications for her multiple health conditions.  ALLERGIES: No Known Allergies  MEDICATIONS: Current Outpatient Medications on File Prior to Visit  Medication Sig Dispense Refill  . CALCIUM PO Take 1 tablet by mouth 2 (two) times daily.     . Cholecalciferol (VITAMIN D PO) Take 5,000 mg by mouth daily.     . Coenzyme Q10 (CO Q 10 PO) Take by mouth daily.    . cyclobenzaprine (FLEXERIL) 10 MG tablet Take 1 tablet by mouth as needed.    . fexofenadine (ALLEGRA) 180 MG tablet Take 180 mg by mouth daily.    Marland Kitchen ibuprofen (ADVIL,MOTRIN) 800 MG tablet Take 1 tablet by mouth as needed.    Marland Kitchen levonorgestrel (MIRENA) 20 MCG/24HR IUD 1 each by Intrauterine route once.    Marland Kitchen lisinopril-hydrochlorothiazide (PRINZIDE,ZESTORETIC) 20-25 MG tablet Take 1 tablet by mouth daily.    Marland Kitchen MAGNESIUM PO Take 500 mg by mouth.    . Melatonin 3 MG TABS Take 1 tablet by mouth at bedtime.    . Multiple Vitamin (MULTIVITAMIN) tablet Take 1 tablet by mouth daily.      Marland Kitchen  mupirocin cream (BACTROBAN) 2 % Apply 1 application topically as needed. 15 g 1  . Omega-3 Fatty Acids (FISH OIL) 1000 MG CAPS Take 1,000 mg by mouth daily.    Marland Kitchen omeprazole (PRILOSEC) 20 MG capsule Take 20 mg by mouth daily as needed.    Marland Kitchen POTASSIUM PO Take 99 mg by mouth.    . Probiotic Product (PROBIOTIC PO) Take by mouth.    . ST JOHNS WORT PO Take by mouth daily.    . Teriparatide, Recombinant, (FORTEO) 600 MCG/2.4ML SOLN Inject 20 mcg into the skin daily.    Marland Kitchen triamcinolone (NASACORT ALLERGY 24HR) 55 MCG/ACT AERO nasal inhaler Place 2 sprays into the nose daily.    . Vitamin D, Ergocalciferol,  (DRISDOL) 1.25 MG (50000 UT) CAPS capsule Take 1 capsule (50,000 Units total) by mouth every 7 (seven) days. 4 capsule 0  . Zoledronic Acid (RECLAST IV) Inject into the vein as directed.     No current facility-administered medications on file prior to visit.     PAST MEDICAL HISTORY: Past Medical History:  Diagnosis Date  . Anxiety    NO MEDS  . Arthritis   . Back pain   . Cervical cancer screening 07/20/2012   Menarche at 13, regular til recently No h/o abnormal paps, last pap 2-3 years ago G0P0  MGM today, no h/o abnormal MGMs Spotting now monthly and scant H/o uterine polyps  . Depression    counseling in 2010 for 2 months on medication  . Fibrocystic breast 1988  . Fibroids 2014  . Fracture of right fibula 05/19/2015   October 2016  . GERD (gastroesophageal reflux disease) 2005   treated with omeprazole  . High cholesterol 2002  . History of sinusitis    once/twice per year  . Hyperglycemia 05/19/2015  . Hypertension 2002  . Joint pain   . MRSA (methicillin resistant staph aureus) culture positive 2007  . Muscular pain   . Obesity   . OSA (obstructive sleep apnea)    +sleep study 08/2010., DOES NOT USE CPAP, USES MOSES MOUTH PIECE  . Osteoarthritis    KNEES, HANDS, HIPS  . Osteoporosis   . Plantar fasciitis of left foot 03/2014  . Seasonal allergies    affected in the spring  . Sleep apnea    mouthpiece "[facility]"  . Uterine polyp   . 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 Vitamin D deficiency   . Wrist fracture 07/2012   right    PAST SURGICAL HISTORY: Past Surgical History:  Procedure Laterality Date  . DILATATION & CURETTAGE/HYSTEROSCOPY WITH MYOSURE N/A 07/30/2014   Procedure: DILATATION & CURETTAGE/HYSTEROSCOPY WITH MYOSURE;  Surgeon: Nunzio Cobbs, MD;  Location: [facility];  Service: Gynecology;  Laterality: N/A;  extra time for BMI 49.  Marland Kitchen DILATATION & CURRETTAGE/HYSTEROSCOPY WITH RESECTOCOPE N/A 11/24/2012   Procedure:  DILATATION & CURETTAGE/HYSTEROSCOPY WITH RESECTOCOPE;  Surgeon: Arloa Koh, MD;  Location: [facility];  Service: Gynecology;  Laterality: N/A;  . ESSURE TUBAL LIGATION Left   . TONSILLECTOMY  1969  . WISDOM TOOTH EXTRACTION  1979  . WRIST FRACTURE SURGERY Right 07/2012    SOCIAL HISTORY: Social History   Tobacco Use  . Smoking status: Former Smoker    Packs/day: 0.50    Years: 6.00    Pack years: 3.00    Types: Cigarettes    Last attempt to quit: 03/01/1989    Years since quitting: 29.1  . Smokeless tobacco:  Never Used  . Tobacco comment: Quit in 1991- started in 1983 1/2ppd  Substance Use Topics  . Alcohol use: No    Alcohol/week: 0.0 standard drinks  . Drug use: No    FAMILY HISTORY: Family History  Problem Relation Age of Onset  . Osteoarthritis Mother   . Hyperlipidemia Mother   . Depression Mother   . Alzheimer's disease Mother   . Hypertension Mother   . Sleep apnea Mother   . Obesity Mother   . Liver cancer Maternal Grandmother        mets to breast  . Breast cancer Maternal Grandmother        liver mets to breast  . Cancer Maternal Grandmother        with mets  . Hyperlipidemia Father   . Coronary artery disease Father   . Hypertension Father   . Stroke Father        multiple  . Diabetes Father   . Obesity Father   . Sleep apnea Father   . Colon cancer Neg Hx     ROS: Review of Systems  Constitutional: Positive for weight loss.  Respiratory: Negative for shortness of breath.   Cardiovascular: Negative for chest pain.    PHYSICAL EXAM: Blood pressure (!) 145/93, pulse 91, temperature 98.1 F (36.7 C), temperature source Oral, height 5\' 4"  (1.626 m), weight 251 lb (113.9 kg), SpO2 98 %. Body mass index is 43.08 kg/m. Physical Exam Vitals signs reviewed.  Constitutional:      Appearance: Normal appearance. She is obese.  Cardiovascular:     Rate and Rhythm: Normal rate.     Pulses: Normal pulses.  Pulmonary:     Effort: Pulmonary effort is  normal.  Musculoskeletal: Normal range of motion.  Skin:    General: Skin is warm and dry.  Neurological:     Mental Status: She is alert and oriented to person, place, and time.  Psychiatric:        Mood and Affect: Mood normal.        Behavior: Behavior normal.     RECENT LABS AND TESTS: BMET    Component Value Date/Time   NA 137 03/13/2018 1250   K 3.6 03/13/2018 1250   CL 95 (L) 03/13/2018 1250   CO2 25 03/13/2018 1250   GLUCOSE 93 03/13/2018 1250   GLUCOSE 99 05/19/2015 1030   BUN 13 03/13/2018 1250   CREATININE 0.81 03/13/2018 1250   CREATININE 0.91 10/29/2013 0843   CALCIUM 9.7 03/13/2018 1250   GFRNONAA 81 03/13/2018 1250   GFRNONAA >60 06/26/2010 1441   GFRAA 93 03/13/2018 1250   GFRAA >60 06/26/2010 1441   Lab Results  Component Value Date   HGBA1C 5.3 03/13/2018   HGBA1C 5.1 06/24/2016   HGBA1C 5.5 05/19/2015   Lab Results  Component Value Date   INSULIN 23.0 03/13/2018   CBC    Component Value Date/Time   WBC 8.3 03/13/2018 1250   WBC 9.8 01/02/2016 1439   RBC 5.36 (H) 03/13/2018 1250   RBC 4.92 01/02/2016 1439   HGB 15.3 03/13/2018 1250   HCT 45.8 03/13/2018 1250   PLT 372 06/24/2016 1139   MCV 85 03/13/2018 1250   MCH 28.5 03/13/2018 1250   MCH 28.0 01/02/2016 1439   MCHC 33.4 03/13/2018 1250   MCHC 33.3 01/02/2016 1439   RDW 13.1 03/13/2018 1250   LYMPHSABS 2.2 03/13/2018 1250   MONOABS 0.5 11/22/2014 0809   EOSABS 0.2 03/13/2018 1250   BASOSABS  0.1 03/13/2018 1250   Iron/TIBC/Ferritin/ %Sat No results found for: IRON, TIBC, FERRITIN, IRONPCTSAT Lipid Panel     Component Value Date/Time   CHOL 278 (H) 03/13/2018 1250   TRIG 171 (H) 03/13/2018 1250   HDL 34 (L) 03/13/2018 1250   CHOLHDL 7.4 (H) 06/24/2016 1139   CHOLHDL 5 05/19/2015 1030   VLDL 22.2 05/19/2015 1030   LDLCALC 210 (H) 03/13/2018 1250   Hepatic Function Panel     Component Value Date/Time   PROT 6.9 03/13/2018 1250   ALBUMIN 4.4 03/13/2018 1250   AST 29  03/13/2018 1250   ALT 47 (H) 03/13/2018 1250   ALKPHOS 65 03/13/2018 1250   BILITOT 0.6 03/13/2018 1250   BILIDIR 0.1 10/29/2013 0843   IBILI 0.4 10/29/2013 0843      Component Value Date/Time   TSH 0.910 03/13/2018 1250   TSH 1.170 06/24/2016 1139   TSH 1.28 11/22/2014 0809      OBESITY BEHAVIORAL INTERVENTION VISIT  Today's visit was # 8   Starting weight: 207 lbs Starting date: 12/27/2017 Today's weight :: 251 lbs Today's date: 04/24/2018 Total lbs lost to date: 11  ASK: We discussed the diagnosis of obesity with Alejandra Miller today and Carmelle agreed to give [name] permission to discuss obesity behavioral modification therapy today.  ASSESS: Ambreen has the diagnosis of obesity and her BMI today is 35.84 Shatisha is in the action stage of change   ADVISE: Glorie was educated on the multiple health risks of obesity as well as the benefit of weight loss to improve her health. She was advised of the need for long term treatment and the importance of lifestyle modifications to improve her current health and to decrease her risk of future health problems.  AGREE: Multiple dietary modification options and treatment options were discussed and  Tyhesha agreed to follow the recommendations documented in the above note.  ARRANGE: Rebecka was educated on the importance of frequent visits to treat obesity as outlined per CMS and USPSTF guidelines and agreed to schedule her next follow up appointment today.  I, Tammy Wysor, am acting as Location manager for [employer], FNP-C.  I have reviewed the above documentation for accuracy and completeness, and I agree with the above.  -  , FNP-C.

## 2018-04-25 ENCOUNTER — Encounter (INDEPENDENT_AMBULATORY_CARE_PROVIDER_SITE_OTHER): Payer: Self-pay | Admitting: Family Medicine

## 2018-05-04 ENCOUNTER — Encounter: Payer: Self-pay | Admitting: Family Medicine

## 2018-05-04 ENCOUNTER — Ambulatory Visit: Payer: 59 | Admitting: Family Medicine

## 2018-05-04 VITALS — BP 130/80 | HR 92 | Temp 97.8°F | Ht 64.0 in | Wt 251.6 lb

## 2018-05-04 DIAGNOSIS — E559 Vitamin D deficiency, unspecified: Secondary | ICD-10-CM | POA: Diagnosis not present

## 2018-05-04 DIAGNOSIS — Z7689 Persons encountering health services in other specified circumstances: Secondary | ICD-10-CM | POA: Diagnosis not present

## 2018-05-04 DIAGNOSIS — E782 Mixed hyperlipidemia: Secondary | ICD-10-CM | POA: Diagnosis not present

## 2018-05-04 DIAGNOSIS — Z6841 Body Mass Index (BMI) 40.0 and over, adult: Secondary | ICD-10-CM

## 2018-05-04 DIAGNOSIS — I10 Essential (primary) hypertension: Secondary | ICD-10-CM

## 2018-05-04 NOTE — Progress Notes (Signed)
Alejandra Miller is a 58 y.o. female  Chief Complaint  Patient presents with  . Establish Care    est care    HPI: Alejandra Miller is a 58 y.o. female here to establish care with our office. Her previous PCP Alejandra Gerlach, PA.with [facility]. Pt states she had labs done in 03/2018 for Weight Management/[facility]. Vit D was low despite being on 5000IU daily so pt was started on Vit D 50,000IU per week x 12 weeks.  She has osteoporosis and receives reclast. She follows with bone clinic at [facility].   Specialists: GYN (Dr. [name]), Ortho   Last CPE, labs: labs done 03/2018  Last PAP: 11/2016 - normal PAP, HPV negative Last mammo: 01/2018 Last Dexa: 04/2017 Last colonoscopy: 2015 - due for f/u in 2025  Med refills needed today: none   Past Medical History:  Diagnosis Date  . Anxiety    NO MEDS  . Arthritis   . Back pain   . Cervical cancer screening 07/20/2012   Menarche at 13, regular til recently No h/o abnormal paps, last pap 2-3 years ago G0P0  MGM today, no h/o abnormal MGMs Spotting now monthly and scant H/o uterine polyps  . Depression    counseling in 2010 for 2 months on medication  . Fibrocystic breast 1988  . Fibroids 2014  . Fracture of right fibula 05/19/2015   October 2016  . GERD (gastroesophageal reflux disease) 2005   treated with omeprazole  . High cholesterol 2002  . History of sinusitis    once/twice per year  . Hyperglycemia 05/19/2015  . Hypertension 2002  . Joint pain   . MRSA (methicillin resistant staph aureus) culture positive 2007  . Muscular pain   . Obesity   . OSA (obstructive sleep apnea)    +sleep study 08/2010., DOES NOT USE CPAP, USES MOSES MOUTH PIECE  . Osteoarthritis    KNEES, HANDS, HIPS  . Osteoporosis   . Plantar fasciitis of left foot 03/2014  . Seasonal allergies    affected in the spring  . Sleep apnea    mouthpiece "[place]"  . Uterine polyp   . 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  Vitamin D deficiency   . Wrist fracture 07/2012   right    Past Surgical History:  Procedure Laterality Date  . DILATATION & CURETTAGE/HYSTEROSCOPY WITH MYOSURE N/A 07/30/2014   Procedure: DILATATION & CURETTAGE/HYSTEROSCOPY WITH MYOSURE;  Surgeon: Nunzio Cobbs, MD;  Location: [facility];  Service: Gynecology;  Laterality: N/A;  extra time for BMI 49.  Marland Kitchen DILATATION & CURRETTAGE/HYSTEROSCOPY WITH RESECTOCOPE N/A 11/24/2012   Procedure: DILATATION & CURETTAGE/HYSTEROSCOPY WITH RESECTOCOPE;  Surgeon: Arloa Koh, MD;  Location: [facility];  Service: Gynecology;  Laterality: N/A;  . ESSURE TUBAL LIGATION Left   . TONSILLECTOMY  1969  . WISDOM TOOTH EXTRACTION  1979  . WRIST FRACTURE SURGERY Right 07/2012    Social History   Socioeconomic History  . Marital status: Married    Spouse name: Alejandra Miller  . Number of children: Not on file  . Years of education: Not on file  . Highest education level: Not on file  Occupational History  . Not on file  Social Needs  . Financial resource strain: Not on file  . Food insecurity:    Worry: Not on file    Inability: Not on file  . Transportation needs:    Medical:  Not on file    Non-medical: Not on file  Tobacco Use  . Smoking status: Former Smoker    Packs/day: 0.50    Years: 6.00    Pack years: 3.00    Types: Cigarettes    Last attempt to quit: 03/01/1989    Years since quitting: 29.1  . Smokeless tobacco: Never Used  . Tobacco comment: Quit in 1991- started in 1983 1/2ppd  Substance and Sexual Activity  . Alcohol use: No    Alcohol/week: 0.0 standard drinks  . Drug use: No  . Sexual activity: Yes    Partners: Male    Birth control/protection: Other-see comments, I.U.D.    Comment: vasectomy/Mirena IUD inserted 03-04-16  Lifestyle  . Physical activity:    Days per week: Not on file    Minutes per session: Not on file  . Stress: Not on file  Relationships  . Social connections:    Talks on phone: Not on file    Gets  together: Not on file    Attends religious service: Not on file    Active member of club or organization: Not on file    Attends meetings of clubs or organizations: Not on file    Relationship status: Not on file  . Intimate partner violence:    Fear of current or ex partner: Not on file    Emotionally abused: Not on file    Physically abused: Not on file    Forced sexual activity: Not on file  Other Topics Concern  . Not on file  Social History Narrative  . Not on file    Family History  Problem Relation Age of Onset  . Osteoarthritis Mother   . Hyperlipidemia Mother   . Depression Mother   . Alzheimer's disease Mother   . Hypertension Mother   . Sleep apnea Mother   . Obesity Mother   . Liver cancer Maternal Grandmother        mets to breast  . Breast cancer Maternal Grandmother        liver mets to breast  . Cancer Maternal Grandmother        with mets  . Hyperlipidemia Father   . Coronary artery disease Father   . Hypertension Father   . Stroke Father        multiple  . Diabetes Father   . Obesity Father   . Sleep apnea Father   . Colon cancer Neg Hx      Immunization History  Administered Date(s) Administered  . Influenza Split 12/06/2011  . Influenza Whole 01/02/2013  . Influenza,inj,Quad PF,6+ Mos 11/02/2013  . Influenza-Unspecified 12/04/2014, 03/03/2016, 11/23/2016, 12/10/2017  . Pneumococcal Conjugate-13 01/22/2013  . Tdap 03/05/2002, 08/02/2012, 01/22/2013    Outpatient Encounter Medications as of 05/04/2018  Medication Sig  . CALCIUM PO Take 1 tablet by mouth 2 (two) times daily.   . Cholecalciferol (VITAMIN D PO) Take 5,000 mg by mouth daily.   . Coenzyme Q10 (CO Q 10 PO) Take by mouth daily.  . cyclobenzaprine (FLEXERIL) 10 MG tablet Take 1 tablet by mouth as needed.  . fexofenadine (ALLEGRA) 180 MG tablet Take 180 mg by mouth daily.  Marland Kitchen ibuprofen (ADVIL,MOTRIN) 800 MG tablet Take 1 tablet by mouth as needed.  Marland Kitchen levonorgestrel (MIRENA) 20  MCG/24HR IUD 1 each by Intrauterine route once.  Marland Kitchen lisinopril-hydrochlorothiazide (PRINZIDE,ZESTORETIC) 20-25 MG tablet Take 1 tablet by mouth daily.  Marland Kitchen MAGNESIUM PO Take 500 mg by mouth.  . Melatonin 3 MG TABS  Take 1 tablet by mouth at bedtime.  . Multiple Vitamin (MULTIVITAMIN) tablet Take 1 tablet by mouth daily.    . mupirocin cream (BACTROBAN) 2 % Apply 1 application topically as needed.  . Omega-3 Fatty Acids (FISH OIL) 1000 MG CAPS Take 1,000 mg by mouth daily.  Marland Kitchen omeprazole (PRILOSEC) 20 MG capsule Take 20 mg by mouth daily as needed.  Marland Kitchen POTASSIUM PO Take 99 mg by mouth.  . Probiotic Product (PROBIOTIC PO) Take by mouth.  . ST JOHNS WORT PO Take by mouth daily.  . Teriparatide, Recombinant, (FORTEO) 600 MCG/2.4ML SOLN Inject 20 mcg into the skin daily.  Marland Kitchen triamcinolone (NASACORT ALLERGY 24HR) 55 MCG/ACT AERO nasal inhaler Place 2 sprays into the nose daily.  . Vitamin D, Ergocalciferol, (DRISDOL) 1.25 MG (50000 UT) CAPS capsule Take 1 capsule (50,000 Units total) by mouth every 7 (seven) days.  . Zoledronic Acid (RECLAST IV) Inject into the vein as directed.   No facility-administered encounter medications on file as of 05/04/2018.      ROS: Gen: no fever, chills  Skin: no rash, itching ENT: no ear pain, ear drainage, nasal congestion, rhinorrhea, sinus pressure, sore throat Eyes: no blurry vision, double vision Resp: no cough, wheeze,SOB CV: no CP, palpitations, LE edema,  GI: no heartburn, n/v/d/c, abd pain GU: no dysuria, urgency, frequency, hematuria  MSK: no joint pain, myalgias, back pain Neuro: no dizziness, headache, weakness, vertigo Psych: no depression, anxiety, insomnia   No Known Allergies  BP 130/80   Pulse 92   Temp 97.8 F (36.6 C) (Oral)   Ht 5\' 4"  (1.626 m)   Wt 251 lb 9.6 oz (114.1 kg)   SpO2 98%   BMI 43.19 kg/m   Physical Exam  Constitutional: She is oriented to person, place, and time. She appears well-developed and well-nourished. No  distress.  Neck: No thyromegaly present.  Cardiovascular: Normal rate and regular rhythm.  Pulmonary/Chest: Effort normal and breath sounds normal. No respiratory distress.  Lymphadenopathy:    She has no cervical adenopathy.  Neurological: She is alert and oriented to person, place, and time.  Psychiatric: She has a normal mood and affect. Her behavior is normal.     A/P:  1. Encounter to establish care with new doctor - pt states she needs to have a CPE and labs prior to 05/2018 in order to get discount on her health insurance. Pt will schedule this today at check out  2. Mixed hyperlipidemia - not on statin  - pt is following with Dawn Whitmire at [facility]   3. Essential hypertension - controlled, at goal - cont current med  4. Vitamin D deficiency - current on Vit D 50,000IU weekly x 12 weeks  5. BMI 40.0-44.9, adult [facility]) - pt is following with Dawn Whitmire at [facility]

## 2018-05-08 ENCOUNTER — Ambulatory Visit (INDEPENDENT_AMBULATORY_CARE_PROVIDER_SITE_OTHER): Payer: 59 | Admitting: Family Medicine

## 2018-05-10 ENCOUNTER — Encounter (INDEPENDENT_AMBULATORY_CARE_PROVIDER_SITE_OTHER): Payer: Self-pay | Admitting: Family Medicine

## 2018-05-10 ENCOUNTER — Ambulatory Visit (INDEPENDENT_AMBULATORY_CARE_PROVIDER_SITE_OTHER): Payer: 59 | Admitting: Family Medicine

## 2018-05-10 ENCOUNTER — Other Ambulatory Visit: Payer: Self-pay

## 2018-05-10 VITALS — BP 140/86 | HR 91 | Temp 97.9°F | Ht 64.0 in | Wt 247.0 lb

## 2018-05-10 DIAGNOSIS — E8881 Metabolic syndrome: Secondary | ICD-10-CM | POA: Diagnosis not present

## 2018-05-10 DIAGNOSIS — Z6841 Body Mass Index (BMI) 40.0 and over, adult: Secondary | ICD-10-CM

## 2018-05-10 DIAGNOSIS — Z9189 Other specified personal risk factors, not elsewhere classified: Secondary | ICD-10-CM

## 2018-05-10 DIAGNOSIS — E559 Vitamin D deficiency, unspecified: Secondary | ICD-10-CM | POA: Diagnosis not present

## 2018-05-10 MED ORDER — VITAMIN D (ERGOCALCIFEROL) 1.25 MG (50000 UNIT) PO CAPS: 50000.0000 [IU] | ORAL_CAPSULE | ORAL | 0 refills | Status: DC

## 2018-05-10 NOTE — Progress Notes (Signed)
Office: [phone]  /  Fax: [phone]   HPI:   Chief Complaint: OBESITY Alejandra Miller is here to discuss her progress with her obesity treatment plan. She is on the  follow the Category 2 plan +100 calories and is following her eating plan approximately 70 % of the time. She states she is exercising 0 minutes 0 times per week. Alejandra Miller has had increased stress at work and has been skipping meals. She has not been eating all of her protein. She has been too busy at work for lunch. Alejandra Miller report eating meals that are not on the plan such as pasta with meat sauce and meatloaf.  Her weight is 247 lb (112 kg) today and has had a weight loss of 4 pounds over a period of 2 weeks since her last visit. She has lost 17 lbs since starting treatment with [name].  Vitamin D deficiency Alejandra Miller has a diagnosis of vitamin D deficiency. She is currently taking prescription Vit D and denies nausea, vomiting or muscle weakness. She is not at goal. Her last Vit D level was at 23.4 on 03/13/2018.  Insulin Resistance Alejandra Miller has a diagnosis of insulin resistance based on her elevated fasting insulin level >5. Although Alejandra Miller's blood glucose readings are still under good control, insulin resistance puts her at greater risk of metabolic syndrome and diabetes. She is not taking metformin currently and continues to work on diet and exercise to decrease risk of diabetes. She denies polyphagia.  At risk for osteopenia and osteoporosis Alejandra Miller is at higher risk of osteopenia and osteoporosis due to vitamin D deficiency.   ASSESSMENT AND PLAN:  Vitamin D deficiency - Plan: Vitamin D, Ergocalciferol, (DRISDOL) 1.25 MG (50000 UT) CAPS capsule  Insulin resistance  At risk for osteoporosis  Class 3 severe obesity with serious comorbidity and body mass index (BMI) of 40.0 to 44.9 in adult, unspecified obesity type ([place])  PLAN:  Vitamin D Deficiency Alejandra Miller was informed that low vitamin D levels contributes to fatigue and are associated with  obesity, breast, and colon cancer. She agrees to continue to take prescription Vit D @50 ,000 IU every week #4 with no refills and will follow up for routine testing of vitamin D, at least 2-3 times per year. She was informed of the risk of over-replacement of vitamin D and agrees to not increase her dose unless she discusses this with [name] first. Alejandra Miller agrees to follow up with our clinic in 3 weeks.  Insulin Resistance Alejandra Miller will continue to work on weight loss, exercise, and decreasing simple carbohydrates in her diet to help decrease the risk of diabetes.  She was informed that eating too many simple carbohydrates or too many calories at one sitting increases the likelihood of GI side effects. Alejandra Miller agrees to continue with her meal plan and follow up with our clinic in 3 weeks.  At risk for osteopenia and osteoporosis Alejandra Miller was given extended  (15 minutes) osteoporosis prevention counseling today. Alejandra Miller is at risk for osteopenia and osteoporosis due to her vitamin D deficiency. She was encouraged to take her vitamin D and follow her higher calcium diet and increase strengthening exercise to help strengthen her bones and decrease her risk of osteopenia and osteoporosis.  Obesity Alejandra Miller is currently in the action stage of change. As such, her goal is to continue with weight loss efforts She has agreed to follow the Category 2 plan + 100 calories. Handout was given: Recipes  Alejandra Miller has not been prescribed exercise at this time.  We discussed the following Behavioral Modification Strategies today: increasing lean protein intake, dealing with family or coworker sabotage, avoiding temptations, no skipping meals, and planning for success   Alejandra Miller has agreed to follow up with our clinic in 3 weeks. She was informed of the importance of frequent follow up visits to maximize her success with intensive lifestyle modifications for her multiple health conditions.  ALLERGIES: No Known Allergies  MEDICATIONS: Current  Outpatient Medications on File Prior to Visit  Medication Sig Dispense Refill  . CALCIUM PO Take 1 tablet by mouth 2 (two) times daily.     . Cholecalciferol (VITAMIN D PO) Take 5,000 mg by mouth daily.     . Coenzyme Q10 (CO Q 10 PO) Take by mouth daily.    . cyclobenzaprine (FLEXERIL) 10 MG tablet Take 1 tablet by mouth as needed.    . fexofenadine (ALLEGRA) 180 MG tablet Take 180 mg by mouth daily.    Marland Kitchen ibuprofen (ADVIL,MOTRIN) 800 MG tablet Take 1 tablet by mouth as needed.    Marland Kitchen levonorgestrel (MIRENA) 20 MCG/24HR IUD 1 each by Intrauterine route once.    Marland Kitchen lisinopril-hydrochlorothiazide (PRINZIDE,ZESTORETIC) 20-25 MG tablet Take 1 tablet by mouth daily.    Marland Kitchen MAGNESIUM PO Take 500 mg by mouth.    . Melatonin 3 MG TABS Take 1 tablet by mouth at bedtime.    . Multiple Vitamin (MULTIVITAMIN) tablet Take 1 tablet by mouth daily.      . mupirocin cream (BACTROBAN) 2 % Apply 1 application topically as needed. 15 g 1  . Omega-3 Fatty Acids (FISH OIL) 1000 MG CAPS Take 1,000 mg by mouth daily.    Marland Kitchen omeprazole (PRILOSEC) 20 MG capsule Take 20 mg by mouth daily as needed.    Marland Kitchen POTASSIUM PO Take 99 mg by mouth.    . Probiotic Product (PROBIOTIC PO) Take by mouth.    . [place] JOH[place] WORT PO Take by mouth daily.    . Teriparatide, Recombinant, (FORTEO) 600 MCG/2.4ML SOLN Inject 20 mcg into the skin daily.    Marland Kitchen triamcinolone (NASACORT ALLERGY 24HR) 55 MCG/ACT AERO nasal inhaler Place 2 sprays into the nose daily.    . Vitamin D, Ergocalciferol, (DRISDOL) 1.25 MG (50000 UT) CAPS capsule Take 1 capsule (50,000 Units total) by mouth every 7 (seven) days. 4 capsule 0  . Zoledronic Acid (RECLAST IV) Inject into the vein as directed.     No current facility-administered medications on file prior to visit.     PAST MEDICAL HISTORY: Past Medical History:  Diagnosis Date  . Anxiety    NO MEDS  . Arthritis   . Back pain   . Cervical cancer screening 07/20/2012   Menarche at 13, regular til recently No h/o  abnormal paps, last pap 2-3 years ago G0P0  MGM today, no h/o abnormal MGMs Spotting now monthly and scant H/o uterine polyps  . Depression    counseling in 2010 for 2 months on medication  . Fibrocystic breast 1988  . Fibroids 2014  . Fracture of right fibula 05/19/2015   October 2016  . GERD (gastroesophageal reflux disease) 2005   treated with omeprazole  . High cholesterol 2002  . History of sinusitis    once/twice per year  . Hyperglycemia 05/19/2015  . Hypertension 2002  . Joint pain   . MRSA (methicillin resistant staph aureus) culture positive 2007  . Muscular pain   . Obesity   . OSA (obstructive sleep apnea)    +sleep study 08/2010.,  DOES NOT USE CPAP, USES MOSES MOUTH PIECE  . Osteoarthritis    KNEES, HANDS, HIPS  . Osteoporosis   . Plantar fasciitis of left foot 03/2014  . Seasonal allergies    affected in the spring  . Sleep apnea    mouthpiece "[place]"  . Uterine polyp   . 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 Vitamin D deficiency   . Wrist fracture 07/2012   right    PAST SURGICAL HISTORY: Past Surgical History:  Procedure Laterality Date  . DILATATION & CURETTAGE/HYSTEROSCOPY WITH MYOSURE N/A 07/30/2014   Procedure: DILATATION & CURETTAGE/HYSTEROSCOPY WITH MYOSURE;  Surgeon: Nunzio Cobbs, MD;  Location: [facility];  Service: Gynecology;  Laterality: N/A;  extra time for BMI 49.  Marland Kitchen DILATATION & CURRETTAGE/HYSTEROSCOPY WITH RESECTOCOPE N/A 11/24/2012   Procedure: DILATATION & CURETTAGE/HYSTEROSCOPY WITH RESECTOCOPE;  Surgeon: Arloa Koh, MD;  Location: [facility];  Service: Gynecology;  Laterality: N/A;  . ESSURE TUBAL LIGATION Left   . TONSILLECTOMY  1969  . WISDOM TOOTH EXTRACTION  1979  . WRIST FRACTURE SURGERY Right 07/2012    SOCIAL HISTORY: Social History   Tobacco Use  . Smoking status: Former Smoker    Packs/day: 0.50    Years: 6.00    Pack years: 3.00    Types: Cigarettes    Last attempt to quit: 03/01/1989     Years since quitting: 29.2  . Smokeless tobacco: Never Used  . Tobacco comment: Quit in 1991- started in 1983 1/2ppd  Substance Use Topics  . Alcohol use: No    Alcohol/week: 0.0 standard drinks  . Drug use: No    FAMILY HISTORY: Family History  Problem Relation Age of Onset  . Osteoarthritis Mother   . Hyperlipidemia Mother   . Depression Mother   . Alzheimer's disease Mother   . Hypertension Mother   . Sleep apnea Mother   . Obesity Mother   . Liver cancer Maternal Grandmother        mets to breast  . Breast cancer Maternal Grandmother        liver mets to breast  . Cancer Maternal Grandmother        with mets  . Hyperlipidemia Father   . Coronary artery disease Father   . Hypertension Father   . Stroke Father        multiple  . Diabetes Father   . Obesity Father   . Sleep apnea Father   . Colon cancer Neg Hx     ROS: Review of Systems  Constitutional: Positive for weight loss.  Gastrointestinal: Negative for nausea and vomiting.  Genitourinary:       Negative for polyuria  Musculoskeletal:       Negative for muscle weakness  Endo/Heme/Allergies:       Negative for polyphagia    PHYSICAL EXAM: Blood pressure 140/86, pulse 91, temperature 97.9 F (36.6 C), temperature source Oral, height 5\' 4"  (1.626 m), weight 247 lb (112 kg), SpO2 97 %. Body mass index is 42.4 kg/m. Physical Exam Vitals signs reviewed.  Constitutional:      Appearance: Normal appearance. She is obese.  Cardiovascular:     Rate and Rhythm: Normal rate.     Pulses: Normal pulses.  Pulmonary:     Effort: Pulmonary effort is normal.  Musculoskeletal: Normal range of motion.  Skin:    General: Skin is warm and dry.  Neurological:     Mental Status: She is  alert and oriented to person, place, and time.  Psychiatric:        Mood and Affect: Mood normal.        Behavior: Behavior normal.     RECENT LABS AND TESTS: BMET    Component Value Date/Time   NA 137 03/13/2018 1250   K  3.6 03/13/2018 1250   CL 95 (L) 03/13/2018 1250   CO2 25 03/13/2018 1250   GLUCOSE 93 03/13/2018 1250   GLUCOSE 99 05/19/2015 1030   BUN 13 03/13/2018 1250   CREATININE 0.81 03/13/2018 1250   CREATININE 0.91 10/29/2013 0843   CALCIUM 9.7 03/13/2018 1250   GFRNONAA 81 03/13/2018 1250   GFRNONAA >60 06/26/2010 1441   GFRAA 93 03/13/2018 1250   GFRAA >60 06/26/2010 1441   Lab Results  Component Value Date   HGBA1C 5.3 03/13/2018   HGBA1C 5.1 06/24/2016   HGBA1C 5.5 05/19/2015   Lab Results  Component Value Date   INSULIN 23.0 03/13/2018   CBC    Component Value Date/Time   WBC 8.3 03/13/2018 1250   WBC 9.8 01/02/2016 1439   RBC 5.36 (H) 03/13/2018 1250   RBC 4.92 01/02/2016 1439   HGB 15.3 03/13/2018 1250   HCT 45.8 03/13/2018 1250   PLT 372 06/24/2016 1139   MCV 85 03/13/2018 1250   MCH 28.5 03/13/2018 1250   MCH 28.0 01/02/2016 1439   MCHC 33.4 03/13/2018 1250   MCHC 33.3 01/02/2016 1439   RDW 13.1 03/13/2018 1250   LYMPHSABS 2.2 03/13/2018 1250   MONOABS 0.5 11/22/2014 0809   EOSABS 0.2 03/13/2018 1250   BASOSABS 0.1 03/13/2018 1250   Iron/TIBC/Ferritin/ %Sat No results found for: IRON, TIBC, FERRITIN, IRONPCTSAT Lipid Panel     Component Value Date/Time   CHOL 278 (H) 03/13/2018 1250   TRIG 171 (H) 03/13/2018 1250   HDL 34 (L) 03/13/2018 1250   CHOLHDL 7.4 (H) 06/24/2016 1139   CHOLHDL 5 05/19/2015 1030   VLDL 22.2 05/19/2015 1030   LDLCALC 210 (H) 03/13/2018 1250   Hepatic Function Panel     Component Value Date/Time   PROT 6.9 03/13/2018 1250   ALBUMIN 4.4 03/13/2018 1250   AST 29 03/13/2018 1250   ALT 47 (H) 03/13/2018 1250   ALKPHOS 65 03/13/2018 1250   BILITOT 0.6 03/13/2018 1250   BILIDIR 0.1 10/29/2013 0843   IBILI 0.4 10/29/2013 0843      Component Value Date/Time   TSH 0.910 03/13/2018 1250   TSH 1.170 06/24/2016 1139   TSH 1.28 11/22/2014 0809  Results for RADA, ZEGERS (MRN [id number]) as of 05/10/2018 16:11  Ref. Range 03/13/2018  12:50  Vitamin D, 25-Hydroxy Latest Ref Range: 30.0 - 100.0 ng/mL 23.4 (L)      OBESITY BEHAVIORAL INTERVENTION VISIT  Today's visit was # 5   Starting weight: 264 lbs Starting date: 03/13/2018 Today's weight : Weight: 247 lb (112 kg)  Today's date: 05/10/2018 Total lbs lost to date: 17     05/10/2018  BP 140/86  Temp 97.9 F (36.6 C)  Pulse 91  SpO2 97 %  Weight 247 lb  Height 5\' 4"  (1.626 m)  BMI (Calculated) 42.38    ASK: We discussed the diagnosis of obesity with Alejandra Miller today and Alejandra Miller agreed to give [name] permission to discuss obesity behavioral modification therapy today.  ASSESS: Alejandra Miller has the diagnosis of obesity and her BMI today is 42.38 Alejandra Miller is in the action stage of change   ADVISE: Alejandra Miller was  educated on the multiple health risks of obesity as well as the benefit of weight loss to improve her health. She was advised of the need for long term treatment and the importance of lifestyle modifications to improve her current health and to decrease her risk of future health problems.  AGREE: Multiple dietary modification options and treatment options were discussed and  Alejandra Miller agreed to follow the recommendations documented in the above note.  ARRANGE: Alejandra Miller was educated on the importance of frequent visits to treat obesity as outlined per CMS and USPSTF guidelines and agreed to schedule her next follow up appointment today.  I,Tammy Wysor, am acting as Location manager for [employer], FNP-C.  I have reviewed the above documentation for accuracy and completeness, and I agree with the above.  - Danon Lograsso, FNP-C.

## 2018-05-22 ENCOUNTER — Encounter (INDEPENDENT_AMBULATORY_CARE_PROVIDER_SITE_OTHER): Payer: Self-pay | Admitting: Family Medicine

## 2018-05-24 ENCOUNTER — Encounter (INDEPENDENT_AMBULATORY_CARE_PROVIDER_SITE_OTHER): Payer: Self-pay | Admitting: Family Medicine

## 2018-05-24 ENCOUNTER — Encounter (INDEPENDENT_AMBULATORY_CARE_PROVIDER_SITE_OTHER): Payer: Self-pay

## 2018-05-31 ENCOUNTER — Ambulatory Visit (INDEPENDENT_AMBULATORY_CARE_PROVIDER_SITE_OTHER): Payer: 59 | Admitting: Family Medicine

## 2018-06-02 ENCOUNTER — Ambulatory Visit: Payer: 59 | Admitting: Family Medicine

## 2018-06-05 ENCOUNTER — Encounter (INDEPENDENT_AMBULATORY_CARE_PROVIDER_SITE_OTHER): Payer: Self-pay | Admitting: Family Medicine

## 2018-06-22 ENCOUNTER — Encounter: Payer: Self-pay | Admitting: Family Medicine

## 2018-07-07 ENCOUNTER — Encounter

## 2018-07-07 ENCOUNTER — Ambulatory Visit: Payer: Self-pay | Admitting: Family Medicine

## 2018-08-31 ENCOUNTER — Encounter: Payer: 59 | Admitting: Family Medicine

## 2018-09-06 ENCOUNTER — Telehealth: Payer: Self-pay

## 2018-09-06 NOTE — Telephone Encounter (Signed)
Questions for Screening COVID-19  Symptom onset: n/a  Travel or Contacts: no  During this illness, did/does the patient experience any of the following symptoms? Fever >100.71F []   Yes [x]   No []   Unknown Subjective fever (felt feverish) []   Yes [x]   No []   Unknown Chills []   Yes [x]   No []   Unknown Muscle aches (myalgia) []   Yes [x]   No []   Unknown Runny nose (rhinorrhea) []   Yes [x]   No []   Unknown Sore throat []   Yes [x]   No []   Unknown Cough (new onset or worsening of chronic cough) []   Yes [x]   No []   Unknown Shortness of breath (dyspnea) []   Yes []   No []   Unknown Nausea or vomiting []   Yes [x]   No []   Unknown Headache []   Yes [x]   No []   Unknown Abdominal pain  []   Yes [x]   No []   Unknown Diarrhea (?3 loose/looser than normal stools/24hr period) []   Yes [x]   No []   Unknown Other, specify:  Patient risk factors: Smoker? []   Current []   Former []   Never If female, currently pregnant? []   Yes []   No  Patient Active Problem List   Diagnosis Date Noted  . Insulin resistance 04/10/2018  . Class 3 severe obesity with serious comorbidity and body mass index (BMI) of 40.0 to 44.9 in adult ([place]) 04/10/2018  . Osteoarthritis   . Fracture of right fibula 05/19/2015  . h/o Hyperglycemia 05/19/2015  . Bursitis of shoulder 11/05/2013  . Cervical cancer screening 07/20/2012  . BMI 40.0-44.9, adult ([place]) 02/12/2012  . Leg swelling 12/08/2011  . Annual physical exam 07/28/2011  . Menstrual irregularity 12/15/2010  . Vitamin D deficiency 12/15/2010  . h/o OSA (obstructive sleep apnea) 08/11/2010  . Arthralgia 07/28/2010  . Hyperlipidemia 07/28/2010  . Hypertension 07/28/2010  . h/o Depression 07/28/2010    Plan:  []   High risk for COVID-19 with red flags go to ED (with CP, SOB, weak/lightheaded, or fever > 101.5). Call ahead.  []   High risk for COVID-19 but stable. Inform provider and coordinate time for [facility] visit.   []   No red flags but URI signs or symptoms okay for [facility]  visit.

## 2018-09-07 ENCOUNTER — Encounter: Payer: Self-pay | Admitting: Family Medicine

## 2018-09-07 ENCOUNTER — Ambulatory Visit (INDEPENDENT_AMBULATORY_CARE_PROVIDER_SITE_OTHER): Payer: 59 | Admitting: Family Medicine

## 2018-09-07 VITALS — BP 130/96 | HR 83 | Temp 98.1°F | Ht 64.0 in | Wt 236.0 lb

## 2018-09-07 DIAGNOSIS — Z0000 Encounter for general adult medical examination without abnormal findings: Secondary | ICD-10-CM

## 2018-09-07 DIAGNOSIS — F339 Major depressive disorder, recurrent, unspecified: Secondary | ICD-10-CM

## 2018-09-07 DIAGNOSIS — I10 Essential (primary) hypertension: Secondary | ICD-10-CM

## 2018-09-07 DIAGNOSIS — E559 Vitamin D deficiency, unspecified: Secondary | ICD-10-CM | POA: Diagnosis not present

## 2018-09-07 DIAGNOSIS — E7849 Other hyperlipidemia: Secondary | ICD-10-CM

## 2018-09-07 DIAGNOSIS — R739 Hyperglycemia, unspecified: Secondary | ICD-10-CM

## 2018-09-07 LAB — HEMOGLOBIN A1C: Hgb A1c MFr Bld: 5.3 % (ref 4.6–6.5)

## 2018-09-07 LAB — ALT: ALT: 35 U/L (ref 0–35)

## 2018-09-07 LAB — BASIC METABOLIC PANEL
BUN: 16 mg/dL (ref 6–23)
CO2: 30 mEq/L (ref 19–32)
Calcium: 10.3 mg/dL (ref 8.4–10.5)
Chloride: 104 mEq/L (ref 96–112)
Creatinine, Ser: 0.9 mg/dL (ref 0.40–1.20)
GFR: 64.42 mL/min (ref >60.00)
Glucose, Bld: 104 mg/dL — ABNORMAL HIGH (ref 70–99)
Potassium: 4.6 mEq/L (ref 3.5–5.1)
Sodium: 145 mEq/L (ref 135–145)

## 2018-09-07 LAB — VITAMIN D 25 HYDROXY (VIT D DEFICIENCY, FRACTURES): VITD: 44.6 ng/mL (ref 30.00–100.00)

## 2018-09-07 LAB — AST: AST: 23 U/L (ref 0–37)

## 2018-09-07 LAB — LIPID PANEL
Cholesterol: 257 mg/dL — ABNORMAL HIGH (ref 0–200)
HDL: 37.4 mg/dL — ABNORMAL LOW (ref >39.00)
LDL Cholesterol: 191 mg/dL — ABNORMAL HIGH (ref 0–99)
NonHDL: 220
Total CHOL/HDL Ratio: 7
Triglycerides: 144 mg/dL (ref 0.0–149.0)
VLDL: 28.8 mg/dL (ref 0.0–40.0)

## 2018-09-07 MED ORDER — BUPROPION HCL ER (XL) 150 MG PO TB24: 150.0000 mg | ORAL_TABLET | Freq: Every day | ORAL | 3 refills | Status: DC

## 2018-09-07 NOTE — Patient Instructions (Signed)
Health Maintenance, Female Adopting a healthy lifestyle and getting preventive care are important in promoting health and wellness. Ask your health care provider about:  The right schedule for you to have regular tests and exams.  Things you can do on your own to prevent diseases and keep yourself healthy. What should I know about diet, weight, and exercise? Eat a healthy diet   Eat a diet that includes plenty of vegetables, fruits, low-fat dairy products, and lean protein.  Do not eat a lot of foods that are high in solid fats, added sugars, or sodium. Maintain a healthy weight Body mass index (BMI) is used to identify weight problems. It estimates body fat based on height and weight. Your health care provider can help determine your BMI and help you achieve or maintain a healthy weight. Get regular exercise Get regular exercise. This is one of the most important things you can do for your health. Most adults should:  Exercise for at least 150 minutes each week. The exercise should increase your heart rate and make you sweat (moderate-intensity exercise).  Do strengthening exercises at least twice a week. This is in addition to the moderate-intensity exercise.  Spend less time sitting. Even light physical activity can be beneficial. Watch cholesterol and blood lipids Have your blood tested for lipids and cholesterol at 57 years of age, then have this test every 5 years. Have your cholesterol levels checked more often if:  Your lipid or cholesterol levels are high.  You are older than 57 years of age.  You are at high risk for heart disease. What should I know about cancer screening? Depending on your health history and family history, you may need to have cancer screening at various ages. This may include screening for:  Breast cancer.  Cervical cancer.  Colorectal cancer.  Skin cancer.  Lung cancer. What should I know about heart disease, diabetes, and high blood  pressure? Blood pressure and heart disease  High blood pressure causes heart disease and increases the risk of stroke. This is more likely to develop in people who have high blood pressure readings, are of African descent, or are overweight.  Have your blood pressure checked: ? Every 3-5 years if you are 18-39 years of age. ? Every year if you are 40 years old or older. Diabetes Have regular diabetes screenings. This checks your fasting blood sugar level. Have the screening done:  Once every three years after age 40 if you are at a normal weight and have a low risk for diabetes.  More often and at a younger age if you are overweight or have a high risk for diabetes. What should I know about preventing infection? Hepatitis B If you have a higher risk for hepatitis B, you should be screened for this virus. Talk with your health care provider to find out if you are at risk for hepatitis B infection. Hepatitis C Testing is recommended for:  Everyone born from 1945 through 1965.  Anyone with known risk factors for hepatitis C. Sexually transmitted infections (STIs)  Get screened for STIs, including gonorrhea and chlamydia, if: ? You are sexually active and are younger than 57 years of age. ? You are older than 57 years of age and your health care provider tells you that you are at risk for this type of infection. ? Your sexual activity has changed since you were last screened, and you are at increased risk for chlamydia or gonorrhea. Ask your health care provider if   you are at risk.  Ask your health care provider about whether you are at high risk for HIV. Your health care provider may recommend a prescription medicine to help prevent HIV infection. If you choose to take medicine to prevent HIV, you should first get tested for HIV. You should then be tested every 3 months for as long as you are taking the medicine. Pregnancy  If you are about to stop having your period (premenopausal) and  you may become pregnant, seek counseling before you get pregnant.  Take 400 to 800 micrograms (mcg) of folic acid every day if you become pregnant.  Ask for birth control (contraception) if you want to prevent pregnancy. Osteoporosis and menopause Osteoporosis is a disease in which the bones lose minerals and strength with aging. This can result in bone fractures. If you are 65 years old or older, or if you are at risk for osteoporosis and fractures, ask your health care provider if you should:  Be screened for bone loss.  Take a calcium or vitamin D supplement to lower your risk of fractures.  Be given hormone replacement therapy (HRT) to treat symptoms of menopause. Follow these instructions at home: Lifestyle  Do not use any products that contain nicotine or tobacco, such as cigarettes, e-cigarettes, and chewing tobacco. If you need help quitting, ask your health care provider.  Do not use street drugs.  Do not share needles.  Ask your health care provider for help if you need support or information about quitting drugs. Alcohol use  Do not drink alcohol if: ? Your health care provider tells you not to drink. ? You are pregnant, may be pregnant, or are planning to become pregnant.  If you drink alcohol: ? Limit how much you use to 0-1 drink a day. ? Limit intake if you are breastfeeding.  Be aware of how much alcohol is in your drink. In the [place] one drink equals one 12 oz bottle of beer (355 mL), one 5 oz glass of wine (148 mL), or one 1 oz glass of hard liquor (44 mL). General instructions  Schedule regular health, dental, and eye exams.  Stay current with your vaccines.  Tell your health care provider if: ? You often feel depressed. ? You have ever been abused or do not feel safe at home. Summary  Adopting a healthy lifestyle and getting preventive care are important in promoting health and wellness.  Follow your health care provider's instructions about healthy  diet, exercising, and getting tested or screened for diseases.  Follow your health care provider's instructions on monitoring your cholesterol and blood pressure. This information is not intended to replace advice given to you by your health care provider. Make sure you discuss any questions you have with your health care provider. Document Released: 08/31/2010 Document Revised: 02/08/2018 Document Reviewed: 02/08/2018 Elsevier Patient Education  2020 [employer]  

## 2018-09-07 NOTE — Progress Notes (Signed)
Alejandra Miller is a 58 y.o. female  Chief Complaint  Patient presents with  . Annual Exam    CPE -- fasting    HPI: Alejandra Miller is a 58 y.o. female here for annual CPE, fasting labs.   Pt endorses a lot of emotional stress/upsetment. Alejandra Miller passed away in early 2018-09-16 and pt was able to be with Miller despite Miller being in nursing home. Pt notes strained relationship with brother whom she had not seen in years until this time. She had difficult interaction with siblings on Miller's [birth year]. Pt restarted effexor in mid-may and is still taking it but she states she is not sure if it is the right med for Alejandra to be taking. She has a h/o depression and thinks she probably "always has a low level of depression that is worse now". She has been on prozac, lexapro, wellbutrin in the past. No suicidal ideations.    Last PAP: 11/2016 - follows with Dr. [name]) and has appt in 11/2018 Last mammo: 01/2018 Last colonoscopy: 09/2013 [facility] Dr. Carlean Purl - due in 2025   Past Medical History:  Diagnosis Date  . Anxiety    NO MEDS  . Arthritis   . Back pain   . Cervical cancer screening 07/20/2012   Menarche at 13, regular til recently No h/o abnormal paps, last pap 2-3 years ago G0P0  MGM today, no h/o abnormal MGMs Spotting now monthly and scant H/o uterine polyps  . Depression    counseling in 2010 for 2 months on medication  . Fibrocystic breast 1988  . Fibroids 2014  . Fracture of right fibula 05/19/2015   October 2016  . GERD (gastroesophageal reflux disease) 2005   treated with omeprazole  . High cholesterol 2002  . History of sinusitis    once/twice per year  . Hyperglycemia 05/19/2015  . Hypertension 2002  . Joint pain   . MRSA (methicillin resistant staph aureus) culture positive 2007  . Muscular pain   . Obesity   . OSA (obstructive sleep apnea)    +sleep study 08/2010., DOES NOT USE CPAP, USES MOSES MOUTH PIECE  . Osteoarthritis    KNEES, HANDS, HIPS  . Osteoporosis   . Plantar fasciitis of left foot 03/2014  . Seasonal allergies    affected in the spring  . Sleep apnea    mouthpiece "[facility]"  . Uterine polyp   . 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 Vitamin D deficiency   . Wrist fracture 2012/09/15   right    Past Surgical History:  Procedure Laterality Date  . DILATATION & CURETTAGE/HYSTEROSCOPY WITH MYOSURE N/A 07/30/2014   Procedure: DILATATION & CURETTAGE/HYSTEROSCOPY WITH MYOSURE;  Surgeon: Nunzio Cobbs, MD;  Location: [facility];  Service: Gynecology;  Laterality: N/A;  extra time for BMI 49.  Marland Kitchen DILATATION & CURRETTAGE/HYSTEROSCOPY WITH RESECTOCOPE N/A 11/24/2012   Procedure: DILATATION & CURETTAGE/HYSTEROSCOPY WITH RESECTOCOPE;  Surgeon: Arloa Koh, MD;  Location: [facility];  Service: Gynecology;  Laterality: N/A;  . ESSURE TUBAL LIGATION Left   . TONSILLECTOMY  1969  . WISDOM TOOTH EXTRACTION  1979  . WRIST FRACTURE SURGERY Right Sep 15, 2012    Social History   Socioeconomic History  . Marital status: Married    Spouse name: Alejandra Miller  . Number of children: Not on file  . Years of education: Not on file  . Highest education level: Not on  file  Occupational History  . Not on file  Social Needs  . Financial resource strain: Not on file  . Food insecurity    Worry: Not on file    Inability: Not on file  . Transportation needs    Medical: Not on file    Non-medical: Not on file  Tobacco Use  . Smoking status: Former Smoker    Packs/day: 0.50    Years: 6.00    Pack years: 3.00    Types: Cigarettes    Quit date: 03/01/1989    Years since quitting: 29.5  . Smokeless tobacco: Never Used  . Tobacco comment: Quit in 1991- started in 1983 1/2ppd  Substance and Sexual Activity  . Alcohol use: No    Alcohol/week: 0.0 standard drinks  . Drug use: No  . Sexual activity: Yes    Partners: Male    Birth control/protection: Other-see comments, I.U.D.    Comment:  vasectomy/Mirena IUD inserted 03-04-16  Lifestyle  . Physical activity    Days per week: Not on file    Minutes per session: Not on file  . Stress: Not on file  Relationships  . Social Herbalist on phone: Not on file    Gets together: Not on file    Attends religious service: Not on file    Active member of club or organization: Not on file    Attends meetings of clubs or organizations: Not on file    Relationship status: Not on file  . Intimate partner violence    Fear of current or ex partner: Not on file    Emotionally abused: Not on file    Physically abused: Not on file    Forced sexual activity: Not on file  Other Topics Concern  . Not on file  Social History Narrative  . Not on file    Family History  Problem Relation Age of Onset  . Osteoarthritis Miller   . Hyperlipidemia Miller   . Depression Miller   . Alzheimer's disease Miller   . Hypertension Miller   . Sleep apnea Miller   . Obesity Miller   . Liver cancer Maternal Grandmother        mets to breast  . Breast cancer Maternal Grandmother        liver mets to breast  . Cancer Maternal Grandmother        with mets  . Hyperlipidemia Father   . Coronary artery disease Father   . Hypertension Father   . Stroke Father        multiple  . Diabetes Father   . Obesity Father   . Sleep apnea Father   . Colon cancer Neg Hx      Immunization History  Administered Date(s) Administered  . Influenza Split 12/06/2011  . Influenza Whole 01/02/2013  . Influenza,inj,Quad PF,6+ Mos 11/02/2013  . Influenza-Unspecified 12/04/2014, 03/03/2016, 11/23/2016, 12/10/2017  . Pneumococcal Conjugate-13 01/22/2013  . Tdap 03/05/2002, 08/02/2012, 01/22/2013    Outpatient Encounter Medications as of 09/07/2018  Medication Sig  . CALCIUM PO Take 1 tablet by mouth 2 (two) times daily.   . Coenzyme Q10 (CO Q 10 PO) Take by mouth daily.  . fexofenadine (ALLEGRA) 180 MG tablet Take 180 mg by mouth daily.  Marland Kitchen ibuprofen  (ADVIL,MOTRIN) 800 MG tablet Take 1 tablet by mouth as needed.  Marland Kitchen levonorgestrel (MIRENA) 20 MCG/24HR IUD 1 each by Intrauterine route once.  Marland Kitchen lisinopril-hydrochlorothiazide (PRINZIDE,ZESTORETIC) 20-25 MG tablet Take 1 tablet by  mouth daily.  Marland Kitchen MAGNESIUM PO Take 500 mg by mouth.  . Melatonin 3 MG TABS Take 1 tablet by mouth at bedtime.  . Multiple Vitamin (MULTIVITAMIN) tablet Take 1 tablet by mouth daily.    . Omega-3 Fatty Acids (FISH OIL) 1000 MG CAPS Take 1,000 mg by mouth daily.  Marland Kitchen omeprazole (PRILOSEC) 20 MG capsule Take 20 mg by mouth daily as needed.  Marland Kitchen POTASSIUM PO Take 99 mg by mouth.  . Probiotic Product (PROBIOTIC PO) Take by mouth.  . triamcinolone (NASACORT ALLERGY 24HR) 55 MCG/ACT AERO nasal inhaler Place 2 sprays into the nose daily.  Marland Kitchen venlafaxine XR (EFFEXOR XR) 37.5 MG 24 hr capsule Take 37.5 mg by mouth daily with breakfast.  . Vitamin D, Ergocalciferol, (DRISDOL) 1.25 MG (50000 UT) CAPS capsule Take 1 capsule (50,000 Units total) by mouth every 7 (seven) days.  . [DISCONTINUED] Cholecalciferol (VITAMIN D PO) Take 50,000 mg by mouth daily.   . cyclobenzaprine (FLEXERIL) 10 MG tablet Take 1 tablet by mouth as needed.  . mupirocin cream (BACTROBAN) 2 % Apply 1 application topically as needed. (Patient not taking: Reported on 09/07/2018)  . ST JOHNS WORT PO Take by mouth daily.  . Teriparatide, Recombinant, (FORTEO) 600 MCG/2.4ML SOLN Inject 20 mcg into the skin daily.  . Zoledronic Acid (RECLAST IV) Inject into the vein as directed.   No facility-administered encounter medications on file as of 09/07/2018.      ROS: Gen: no fever, chills  Skin: no rash, itching ENT: no ear pain, ear drainage, nasal congestion, rhinorrhea, sinus pressure, sore throat Eyes: no blurry vision, double vision Resp: no cough, wheeze,SOB CV: no CP, palpitations, LE edema,  GI: no heartburn, n/v/d/c, abd pain GU: no dysuria, urgency, frequency, hematuria MSK: no joint pain, myalgias, back pain  Neuro: no dizziness, headache, weakness, vertigo Psych: as above in HPI   No Known Allergies  BP (!) 130/96   Pulse 83   Temp 98.1 F (36.7 C) (Oral)   Ht 5\' 4"  (1.626 m)   Wt 236 lb (107 kg)   SpO2 97%   BMI 40.51 kg/m  BP Readings from Last 3 Encounters:  09/07/18 (!) 130/96  05/10/18 140/86  05/04/18 130/80   Wt Readings from Last 3 Encounters:  09/07/18 236 lb (107 kg)  05/10/18 247 lb (112 kg)  05/04/18 251 lb 9.6 oz (114.1 kg)     Physical Exam  Constitutional: She is oriented to person, place, and time. She appears well-developed and well-nourished. No distress.  HENT:  Head: Normocephalic and atraumatic.  Right Ear: Tympanic membrane and ear canal normal.  Left Ear: Tympanic membrane and ear canal normal.  Nose: Nose normal.  Mouth/Throat: Oropharynx is clear and moist and mucous membranes are normal.  Eyes: Pupils are equal, round, and reactive to light. Conjunctivae are normal.  Neck: Neck supple. No thyromegaly present.  Cardiovascular: Normal rate, regular rhythm, normal heart sounds and intact distal pulses.  No murmur heard. Pulmonary/Chest: Effort normal and breath sounds normal. No respiratory distress. She has no wheezes. She has no rhonchi.  Abdominal: Soft. Bowel sounds are normal. She exhibits no distension and no mass. There is no abdominal tenderness.  Musculoskeletal:        General: No edema.  Lymphadenopathy:    She has no cervical adenopathy.  Neurological: She is alert and oriented to person, place, and time. She exhibits normal muscle tone. Coordination normal.  Skin: Skin is warm and dry.  Psychiatric: She has a normal  mood and affect. Alejandra behavior is normal.     A/P:  1. Annual physical exam - immunizations UTD - UTD on PAP, mammo, colonoscopy - pt has intentionally lost weight since last OV with me - congratulated pt on this - UTD on dental exam and has vision appt scheduled - ALT - AST - Basic metabolic panel - Lipid panel  - VITAMIN D 25 Hydroxy (Vit-D Deficiency, Fractures) - next CPE in 1 year  2. Essential hypertension - DBP slightly elevated today but pt did not take med today - cont with low sodium diet and weight loss - Basic metabolic panel - f/u in 6 mo  3. Other hyperlipidemia - Lipid panel  4. Vitamin D deficiency - pt taking Vit D 50,000IU weekly - VITAMIN D 25 Hydroxy (Vit-D Deficiency, Fractures)  5. h/o Hyperglycemia - Hemoglobin A1c  6. Depression, recurrent ([place]) - stop effexor 37.5mg  daily Rx: - buPROPion (WELLBUTRIN XL) 150 MG 24 hr tablet; Take 1 tablet (150 mg total) by mouth daily.  Dispense: 90 tablet; Refill: 3 - f/u in 3-4 wks or sooner PRN Discussed plan and reviewed medications with patient, including risks, benefits, and potential side effects. Pt expressed understand. All questions answered.

## 2018-09-08 MED ORDER — CYCLOBENZAPRINE HCL 10 MG PO TABS: 10.0000 mg | ORAL_TABLET | ORAL | 1 refills | Status: DC | PRN

## 2018-09-28 ENCOUNTER — Encounter: Payer: Self-pay | Admitting: Family Medicine

## 2018-09-28 DIAGNOSIS — F339 Major depressive disorder, recurrent, unspecified: Secondary | ICD-10-CM

## 2018-09-28 MED ORDER — BUPROPION HCL ER (XL) 300 MG PO TB24: 300.0000 mg | ORAL_TABLET | Freq: Every day | ORAL | 1 refills | Status: DC

## 2018-10-05 ENCOUNTER — Ambulatory Visit: Payer: 59 | Admitting: Family Medicine

## 2018-10-10 ENCOUNTER — Encounter: Payer: Self-pay | Admitting: Family Medicine

## 2018-10-11 ENCOUNTER — Encounter: Payer: Self-pay | Admitting: Family Medicine

## 2018-10-11 ENCOUNTER — Telehealth (INDEPENDENT_AMBULATORY_CARE_PROVIDER_SITE_OTHER): Payer: 59 | Admitting: Family Medicine

## 2018-10-11 DIAGNOSIS — J0100 Acute maxillary sinusitis, unspecified: Secondary | ICD-10-CM | POA: Diagnosis not present

## 2018-10-11 MED ORDER — GUAIFENESIN-CODEINE 100-10 MG/5ML PO SYRP: 5.0000 mL | ORAL_SOLUTION | Freq: Three times a day (TID) | ORAL | 0 refills | Status: DC | PRN

## 2018-10-11 MED ORDER — METHYLPREDNISOLONE 4 MG PO TBPK: ORAL_TABLET | ORAL | 0 refills | Status: DC

## 2018-10-11 NOTE — Progress Notes (Signed)
Virtual Visit via Video Note  I connected with Alejandra Miller on 10/11/18 at  2:00 PM EDT by a video enabled telemedicine application and verified that I am speaking with the correct person using two identifiers. Location patient: home Location provider: home office Persons participating in the virtual visit: patient, provider  I discussed the limitations of evaluation and management by telemedicine and the availability of in person appointments. The patient expressed understanding and agreed to proceed.  Chief Complaint  Patient presents with  . Sinus Problem    started sunday. monday - congestion, headache. tuesday- more congestion & headache in sinus region. No fever, has tried otc medications without relief.    HPI: Alejandra Miller is a 58 y.o. female complains of sinus congestion, facial pressure, and headache x 3-4 days. She endorses scratchy throat and throat clearing. No sore throat. No ear pain but some ear fullness. No dizziness.  Denies fever, chills, SOB, CP, body aches, GI symptoms.  She is taking Coricidin without significant improvement. She takes zyrtec daily as well as nasacort and has been using nasal saline spray.   Past Medical History:  Diagnosis Date  . Anxiety    NO MEDS  . Arthritis   . Back pain   . Cervical cancer screening 07/20/2012   Menarche at 13, regular til recently No h/o abnormal paps, last pap 2-3 years ago G0P0  MGM today, no h/o abnormal MGMs Spotting now monthly and scant H/o uterine polyps  . Depression    counseling in 2010 for 2 months on medication  . Fibrocystic breast 1988  . Fibroids 2014  . Fracture of right fibula 05/19/2015   October 2016  . GERD (gastroesophageal reflux disease) 2005   treated with omeprazole  . High cholesterol 2002  . History of sinusitis    once/twice per year  . Hyperglycemia 05/19/2015  . Hypertension 2002  . Joint pain   . MRSA (methicillin resistant staph aureus) culture positive 2007  . Muscular pain   .  Obesity   . OSA (obstructive sleep apnea)    +sleep study 08/2010., DOES NOT USE CPAP, USES MOSES MOUTH PIECE  . Osteoarthritis    KNEES, HANDS, HIPS  . Osteoporosis   . Plantar fasciitis of left foot 03/2014  . Seasonal allergies    affected in the spring  . Sleep apnea    mouthpiece "[facility]"  . Uterine polyp   . 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 Vitamin D deficiency   . Wrist fracture 07/2012   right    Past Surgical History:  Procedure Laterality Date  . DILATATION & CURETTAGE/HYSTEROSCOPY WITH MYOSURE N/A 07/30/2014   Procedure: DILATATION & CURETTAGE/HYSTEROSCOPY WITH MYOSURE;  Surgeon: Nunzio Cobbs, MD;  Location: [facility];  Service: Gynecology;  Laterality: N/A;  extra time for BMI 49.  Marland Kitchen DILATATION & CURRETTAGE/HYSTEROSCOPY WITH RESECTOCOPE N/A 11/24/2012   Procedure: DILATATION & CURETTAGE/HYSTEROSCOPY WITH RESECTOCOPE;  Surgeon: Arloa Koh, MD;  Location: [facility];  Service: Gynecology;  Laterality: N/A;  . ESSURE TUBAL LIGATION Left   . TONSILLECTOMY  1969  . WISDOM TOOTH EXTRACTION  1979  . WRIST FRACTURE SURGERY Right 07/2012    Family History  Problem Relation Age of Onset  . Osteoarthritis Mother   . Hyperlipidemia Mother   . Depression Mother   . Alzheimer's disease Mother   . Hypertension Mother   . Sleep apnea Mother   . Obesity Mother   .  Liver cancer Maternal Grandmother        mets to breast  . Breast cancer Maternal Grandmother        liver mets to breast  . Cancer Maternal Grandmother        with mets  . Hyperlipidemia Father   . Coronary artery disease Father   . Hypertension Father   . Stroke Father        multiple  . Diabetes Father   . Obesity Father   . Sleep apnea Father   . Colon cancer Neg Hx     Social History   Tobacco Use  . Smoking status: Former Smoker    Packs/day: 0.50    Years: 6.00    Pack years: 3.00    Types: Cigarettes    Quit date: 03/01/1989    Years since quitting: 29.6   . Smokeless tobacco: Never Used  . Tobacco comment: Quit in 1991- started in 1983 1/2ppd  Substance Use Topics  . Alcohol use: No    Alcohol/week: 0.0 standard drinks  . Drug use: No     Current Outpatient Medications:  .  buPROPion (WELLBUTRIN XL) 300 MG 24 hr tablet, Take 1 tablet (300 mg total) by mouth daily., Disp: 90 tablet, Rfl: 1 .  CALCIUM PO, Take 1 tablet by mouth 2 (two) times daily. , Disp: , Rfl:  .  Coenzyme Q10 (CO Q 10 PO), Take by mouth daily., Disp: , Rfl:  .  cyclobenzaprine (FLEXERIL) 10 MG tablet, Take 1 tablet (10 mg total) by mouth as needed., Disp: 90 tablet, Rfl: 1 .  fexofenadine (ALLEGRA) 180 MG tablet, Take 180 mg by mouth daily., Disp: , Rfl:  .  ibuprofen (ADVIL,MOTRIN) 800 MG tablet, Take 1 tablet by mouth as needed., Disp: , Rfl:  .  levonorgestrel (MIRENA) 20 MCG/24HR IUD, 1 each by Intrauterine route once., Disp: , Rfl:  .  lisinopril-hydrochlorothiazide (PRINZIDE,ZESTORETIC) 20-25 MG tablet, Take 1 tablet by mouth daily., Disp: , Rfl:  .  MAGNESIUM PO, Take 500 mg by mouth., Disp: , Rfl:  .  Melatonin 3 MG TABS, Take 1 tablet by mouth at bedtime., Disp: , Rfl:  .  Multiple Vitamin (MULTIVITAMIN) tablet, Take 1 tablet by mouth daily.  , Disp: , Rfl:  .  mupirocin cream (BACTROBAN) 2 %, Apply 1 application topically as needed., Disp: 15 g, Rfl: 1 .  Omega-3 Fatty Acids (FISH OIL) 1000 MG CAPS, Take 1,000 mg by mouth daily., Disp: , Rfl:  .  omeprazole (PRILOSEC) 20 MG capsule, Take 20 mg by mouth daily as needed., Disp: , Rfl:  .  POTASSIUM PO, Take 99 mg by mouth., Disp: , Rfl:  .  Probiotic Product (PROBIOTIC PO), Take by mouth., Disp: , Rfl:  .  ST JOHNS WORT PO, Take by mouth daily., Disp: , Rfl:  .  Teriparatide, Recombinant, (FORTEO) 600 MCG/2.4ML SOLN, Inject 20 mcg into the skin daily., Disp: , Rfl:  .  triamcinolone (NASACORT ALLERGY 24HR) 55 MCG/ACT AERO nasal inhaler, Place 2 sprays into the nose daily., Disp: , Rfl:  .  venlafaxine XR  (EFFEXOR XR) 37.5 MG 24 hr capsule, Take 37.5 mg by mouth daily with breakfast., Disp: , Rfl:  .  Vitamin D, Ergocalciferol, (DRISDOL) 1.25 MG (50000 UT) CAPS capsule, Take 1 capsule (50,000 Units total) by mouth every 7 (seven) days., Disp: 4 capsule, Rfl: 0 .  Zoledronic Acid (RECLAST IV), Inject into the vein as directed., Disp: , Rfl:   No Known  Allergies    ROS: See pertinent positives and negatives per HPI.   EXAM:  VITALS per patient if applicable: There were no vitals taken for this visit.   GENERAL: alert, oriented, appears well and in no acute distress  HEENT: atraumatic, conjunctiva clear, no obvious abnormalities on inspection of external nose and ears  NECK: normal movements of the head and neck  LUNGS: on inspection no signs of respiratory distress, breathing rate appears normal, no obvious gross SOB, gasping or wheezing, no conversational dyspnea  CV: no obvious cyanosis  MS: moves all visible extremities without noticeable abnormality  PSYCH/NEURO: pleasant and cooperative, speech and thought processing grossly intact   ASSESSMENT AND PLAN: 1. Acute non-recurrent maxillary sinusitis - cont with supportive care to include increased water intake, tylenol or ibuprofen PRN, nasal saline spray - cont with zyrtec, Nasacort daily Rx: - guaiFENesin-codeine (CHERATUSSIN AC) 100-10 MG/5ML syrup; Take 5 mLs by mouth 3 (three) times daily as needed for cough.  Dispense: 120 mL; Refill: 0 - methylPREDNISolone (MEDROL DOSEPAK) 4 MG TBPK tablet; Take as directed  Dispense: 21 tablet; Refill: 0 - pt to f/u in 5-7 days if no/minimal improvement, sooner if symptoms worsen   I discussed the assessment and treatment plan with the patient. The patient was provided an opportunity to ask questions and all were answered. The patient agreed with the plan and demonstrated an understanding of the instructions.   The patient was advised to call back or seek an in-person evaluation if  the symptoms worsen or if the condition fails to improve as anticipated.   Letta Median, DO

## 2018-10-25 ENCOUNTER — Telehealth: Payer: Self-pay

## 2018-10-25 NOTE — Telephone Encounter (Signed)
Questions for Screening COVID-19  Symptom onset: n/a  Travel or Contacts: no  During this illness, did/does the patient experience any of the following symptoms? Fever >100.71F []   Yes [x]   No []   Unknown Subjective fever (felt feverish) []   Yes [x]   No []   Unknown Chills []   Yes [x]   No []   Unknown Muscle aches (myalgia) []   Yes [x]   No []   Unknown Runny nose (rhinorrhea) []   Yes [x]   No []   Unknown Sore throat []   Yes [x]   No []   Unknown Cough (new onset or worsening of chronic cough) []   Yes [x]   No []   Unknown Shortness of breath (dyspnea) []   Yes [x]   No []   Unknown Nausea or vomiting []   Yes [x]   No []   Unknown Headache []   Yes [x]   No []   Unknown Abdominal pain  []   Yes [x]   No []   Unknown Diarrhea (?3 loose/looser than normal stools/24hr period) []   Yes [x]   No []   Unknown Other, specify:  Patient risk factors: Smoker? []   Current []   Former []   Never If female, currently pregnant? []   Yes []   No  Patient Active Problem List   Diagnosis Date Noted  . Insulin resistance 04/10/2018  . Class 3 severe obesity with serious comorbidity and body mass index (BMI) of 40.0 to 44.9 in adult ([place]) 04/10/2018  . Osteoarthritis   . Fracture of right fibula 05/19/2015  . h/o Hyperglycemia 05/19/2015  . Bursitis of shoulder 11/05/2013  . Cervical cancer screening 07/20/2012  . BMI 40.0-44.9, adult ([place]) 02/12/2012  . Leg swelling 12/08/2011  . Annual physical exam 07/28/2011  . Menstrual irregularity 12/15/2010  . Vitamin D deficiency 12/15/2010  . h/o OSA (obstructive sleep apnea) 08/11/2010  . Arthralgia 07/28/2010  . Hyperlipidemia 07/28/2010  . Hypertension 07/28/2010  . h/o Depression 07/28/2010    Plan:  []   High risk for COVID-19 with red flags go to ED (with CP, SOB, weak/lightheaded, or fever > 101.5). Call ahead.  []   High risk for COVID-19 but stable. Inform provider and coordinate time for [facility] visit.   []   No red flags but URI signs or symptoms okay for [facility]  visit.

## 2018-10-26 ENCOUNTER — Encounter: Payer: Self-pay | Admitting: Family Medicine

## 2018-10-26 ENCOUNTER — Ambulatory Visit: Payer: 59 | Admitting: Family Medicine

## 2018-10-26 VITALS — BP 126/88 | HR 87 | Temp 98.2°F | Ht 64.0 in | Wt 235.8 lb

## 2018-10-26 DIAGNOSIS — Z23 Encounter for immunization: Secondary | ICD-10-CM | POA: Diagnosis not present

## 2018-10-26 DIAGNOSIS — I10 Essential (primary) hypertension: Secondary | ICD-10-CM | POA: Diagnosis not present

## 2018-10-26 DIAGNOSIS — F419 Anxiety disorder, unspecified: Secondary | ICD-10-CM

## 2018-10-26 DIAGNOSIS — F339 Major depressive disorder, recurrent, unspecified: Secondary | ICD-10-CM | POA: Diagnosis not present

## 2018-10-26 DIAGNOSIS — E559 Vitamin D deficiency, unspecified: Secondary | ICD-10-CM

## 2018-10-26 MED ORDER — VITAMIN D (ERGOCALCIFEROL) 1.25 MG (50000 UNIT) PO CAPS: 50000.0000 [IU] | ORAL_CAPSULE | ORAL | 2 refills | Status: DC

## 2018-10-26 MED ORDER — LISINOPRIL-HYDROCHLOROTHIAZIDE 20-25 MG PO TABS: 1.0000 | ORAL_TABLET | Freq: Every day | ORAL | 3 refills | Status: DC

## 2018-10-26 NOTE — Progress Notes (Signed)
Alejandra Miller is a 58 y.o. female  Chief Complaint  Patient presents with  . Follow-up    no complaints/ feels a lot better/ wants flu shot  . Medication Refill    lisinopril- HCTZ and vitamin D2    HPI: Alejandra Miller is a 58 y.o. female here for f/u on anxiety and depression as well as HTN and refill of her lisinopril/HCTZ. At [facility] on 09/07/18, pts effexor was d/c'd and pt was started on Wellbutrin XL 150mg  daily. This was increased to 300mg  daily on 09/28/18. Pt states she feels the med and dose are very effective. She states she feels "much better".    Depression screen [facility] 2/9 10/26/2018 03/13/2018 06/24/2016  Decreased Interest 1 3 0  Down, Depressed, Hopeless 1 3 0  PHQ - 2 Score 2 6 0  Altered sleeping 0 3 2  Tired, decreased energy 1 3 1   Change in appetite 0 3 0  Feeling bad or failure about yourself  0 2 0  Trouble concentrating 0 1 0  Moving slowly or fidgety/restless 0 0 0  Suicidal thoughts 0 0 0  PHQ-9 Score 3 18 3   Difficult doing work/chores - Somewhat difficult -   GAD 7 : Generalized Anxiety Score 10/26/2018  Nervous, Anxious, on Edge 1  Control/stop worrying 0  Worry too much - different things 1  Trouble relaxing 1  Restless 0  Easily annoyed or irritable 3  Afraid - awful might happen 0  Total GAD 7 Score 6     Pt also needs refill of HTN med and Vit D. BP at goal. Pt denies HA, dizziness, vision changes, CP, SOB, palpitations, LE edema, cough.  She would like a flu shot today.  Past Medical History:  Diagnosis Date  . Anxiety    NO MEDS  . Arthritis   . Back pain   . Cervical cancer screening 07/20/2012   Menarche at 13, regular til recently No h/o abnormal paps, last pap 2-3 years ago G0P0  MGM today, no h/o abnormal MGMs Spotting now monthly and scant H/o uterine polyps  . Depression    counseling in 2010 for 2 months on medication  . Fibrocystic breast 1988  . Fibroids 2014  . Fracture of right fibula 05/19/2015   October 2016  . GERD  (gastroesophageal reflux disease) 2005   treated with omeprazole  . High cholesterol 2002  . History of sinusitis    once/twice per year  . Hyperglycemia 05/19/2015  . Hypertension 2002  . Joint pain   . MRSA (methicillin resistant staph aureus) culture positive 2007  . Muscular pain   . Obesity   . OSA (obstructive sleep apnea)    +sleep study 08/2010., DOES NOT USE CPAP, USES MOSES MOUTH PIECE  . Osteoarthritis    KNEES, HANDS, HIPS  . Osteoporosis   . Plantar fasciitis of left foot 03/2014  . Seasonal allergies    affected in the spring  . Sleep apnea    mouthpiece "[facility]"  . Uterine polyp   . 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 Vitamin D deficiency   . Wrist fracture 07/2012   right    Past Surgical History:  Procedure Laterality Date  . DILATATION & CURETTAGE/HYSTEROSCOPY WITH MYOSURE N/A 07/30/2014   Procedure: DILATATION & CURETTAGE/HYSTEROSCOPY WITH MYOSURE;  Surgeon: Nunzio Cobbs, MD;  Location: [facility];  Service: Gynecology;  Laterality: N/A;  extra time for BMI  [place] N/A 11/24/2012   Procedure: DILATATION & CURETTAGE/HYSTEROSCOPY WITH RESECTOCOPE;  Surgeon: Arloa Koh, MD;  Location: [facility];  Service: Gynecology;  Laterality: N/A;  . ESSURE TUBAL LIGATION Left   . TONSILLECTOMY  1969  . WISDOM TOOTH EXTRACTION  1979  . WRIST FRACTURE SURGERY Right 07/2012    Social History   Socioeconomic History  . Marital status: Married    Spouse name: Lylee Olmedo  . Number of children: Not on file  . Years of education: Not on file  . Highest education level: Not on file  Occupational History  . Not on file  Social Needs  . Financial resource strain: Not on file  . Food insecurity    Worry: Not on file    Inability: Not on file  . Transportation needs    Medical: Not on file    Non-medical: Not on file  Tobacco Use  . Smoking status: Former Smoker    Packs/day: 0.50     Years: 6.00    Pack years: 3.00    Types: Cigarettes    Quit date: 03/01/1989    Years since quitting: 29.6  . Smokeless tobacco: Never Used  . Tobacco comment: Quit in 1991- started in 1983 1/2ppd  Substance and Sexual Activity  . Alcohol use: No    Alcohol/week: 0.0 standard drinks  . Drug use: No  . Sexual activity: Yes    Partners: Male    Birth control/protection: Other-see comments, I.U.D.    Comment: vasectomy/Mirena IUD inserted 03-04-16  Lifestyle  . Physical activity    Days per week: Not on file    Minutes per session: Not on file  . Stress: Not on file  Relationships  . Social Herbalist on phone: Not on file    Gets together: Not on file    Attends religious service: Not on file    Active member of club or organization: Not on file    Attends meetings of clubs or organizations: Not on file    Relationship status: Not on file  . Intimate partner violence    Fear of current or ex partner: Not on file    Emotionally abused: Not on file    Physically abused: Not on file    Forced sexual activity: Not on file  Other Topics Concern  . Not on file  Social History Narrative  . Not on file    Family History  Problem Relation Age of Onset  . Osteoarthritis Mother   . Hyperlipidemia Mother   . Depression Mother   . Alzheimer's disease Mother   . Hypertension Mother   . Sleep apnea Mother   . Obesity Mother   . Liver cancer Maternal Grandmother        mets to breast  . Breast cancer Maternal Grandmother        liver mets to breast  . Cancer Maternal Grandmother        with mets  . Hyperlipidemia Father   . Coronary artery disease Father   . Hypertension Father   . Stroke Father        multiple  . Diabetes Father   . Obesity Father   . Sleep apnea Father   . Colon cancer Neg Hx      Immunization History  Administered Date(s) Administered  . Influenza Split 12/06/2011  . Influenza Whole 01/02/2013  . Influenza,inj,Quad PF,6+ Mos 11/02/2013,  12/10/2017, 10/26/2018  .  Influenza-Unspecified 12/04/2014, 03/03/2016, 11/23/2016, 12/10/2017  . Pneumococcal Conjugate-13 01/22/2013  . Tdap 03/05/2002, 08/02/2012, 01/22/2013    Outpatient Encounter Medications as of 10/26/2018  Medication Sig  . buPROPion (WELLBUTRIN XL) 300 MG 24 hr tablet Take 1 tablet (300 mg total) by mouth daily.  Marland Kitchen CALCIUM PO Take 1 tablet by mouth 2 (two) times daily.   . Coenzyme Q10 (CO Q 10 PO) Take by mouth daily.  . cyclobenzaprine (FLEXERIL) 10 MG tablet Take 1 tablet (10 mg total) by mouth as needed.  Marland Kitchen guaiFENesin-codeine (CHERATUSSIN AC) 100-10 MG/5ML syrup Take 5 mLs by mouth 3 (three) times daily as needed for cough.  Marland Kitchen ibuprofen (ADVIL,MOTRIN) 800 MG tablet Take 1 tablet by mouth as needed.  Marland Kitchen levonorgestrel (MIRENA) 20 MCG/24HR IUD 1 each by Intrauterine route once.  Marland Kitchen lisinopril-hydrochlorothiazide (ZESTORETIC) 20-25 MG tablet Take 1 tablet by mouth daily.  Marland Kitchen MAGNESIUM PO Take 500 mg by mouth.  . Melatonin 3 MG TABS Take 1 tablet by mouth at bedtime.  . methylPREDNISolone (MEDROL DOSEPAK) 4 MG TBPK tablet Take as directed  . Multiple Vitamin (MULTIVITAMIN) tablet Take 1 tablet by mouth daily.    . mupirocin cream (BACTROBAN) 2 % Apply 1 application topically as needed.  . Omega-3 Fatty Acids (FISH OIL) 1000 MG CAPS Take 1,000 mg by mouth daily.  Marland Kitchen omeprazole (PRILOSEC) 20 MG capsule Take 20 mg by mouth daily as needed.  Marland Kitchen POTASSIUM PO Take 99 mg by mouth.  . Probiotic Product (PROBIOTIC PO) Take by mouth.  . Teriparatide, Recombinant, (FORTEO) 600 MCG/2.4ML SOLN Inject 20 mcg into the skin daily.  Marland Kitchen triamcinolone (NASACORT ALLERGY 24HR) 55 MCG/ACT AERO nasal inhaler Place 2 sprays into the nose daily.  . Vitamin D, Ergocalciferol, (DRISDOL) 1.25 MG (50000 UT) CAPS capsule Take 1 capsule (50,000 Units total) by mouth every 7 (seven) days.  . Zoledronic Acid (RECLAST IV) Inject into the vein as directed.  . [DISCONTINUED]  lisinopril-hydrochlorothiazide (PRINZIDE,ZESTORETIC) 20-25 MG tablet Take 1 tablet by mouth daily.  . [DISCONTINUED] Vitamin D, Ergocalciferol, (DRISDOL) 1.25 MG (50000 UT) CAPS capsule Take 1 capsule (50,000 Units total) by mouth every 7 (seven) days.  . [DISCONTINUED] fexofenadine (ALLEGRA) 180 MG tablet Take 180 mg by mouth daily.  . [DISCONTINUED] [place] PO Take by mouth daily.  . [DISCONTINUED] venlafaxine XR (EFFEXOR XR) 37.5 MG 24 hr capsule Take 37.5 mg by mouth daily with breakfast.   No facility-administered encounter medications on file as of 10/26/2018.      ROS: Pertinent positives and negatives noted in HPI. Remainder of ROS non-contributory  No Known Allergies  BP 126/88   Pulse 87   Temp 98.2 F (36.8 C) (Oral)   Ht 5\' 4"  (1.626 m)   Wt 235 lb 12.8 oz (107 kg)   SpO2 99%   BMI 40.47 kg/m   BP Readings from Last 3 Encounters:  10/26/18 126/88  09/07/18 (!) 130/96  05/10/18 140/86   Wt Readings from Last 3 Encounters:  10/26/18 235 lb 12.8 oz (107 kg)  09/07/18 236 lb (107 kg)  05/10/18 247 lb (112 kg)    Physical Exam  Constitutional: She is oriented to person, place, and time. She appears well-developed and well-nourished. No distress.  Neck: Neck supple. No JVD present.  Cardiovascular: Normal rate, regular rhythm and normal heart sounds.  No murmur heard. Pulmonary/Chest: Effort normal and breath sounds normal. No respiratory distress. She has no wheezes.  Lymphadenopathy:    She has no cervical adenopathy.  Neurological: She is alert and oriented to person, place, and time.  Psychiatric: She has a normal mood and affect. Her behavior is normal.     A/P:  1. Need for influenza vaccination - Flu Vaccine QUAD 6+ mos PF IM (Fluarix Quad PF)  2. Anxiety - GAD-7 = 6 - much-improved on wellbutrin XL 300mg  daily - continue - f/u in 6 mo or sooner PRN  3. Essential hypertension - well-controlled, at goal Refill: -  lisinopril-hydrochlorothiazide (ZESTORETIC) 20-25 MG tablet; Take 1 tablet by mouth daily.  Dispense: 90 tablet; Refill: 3  4. Depression, recurrent ([place]) - PHQ-9 = 3 - much-improved on wellbutrin XL 300mg  daily - continue - f/u in 6 mo or sooner PRN  5. Vitamin D deficiency Refill: - Vitamin D, Ergocalciferol, (DRISDOL) 1.25 MG (50000 UT) CAPS capsule; Take 1 capsule (50,000 Units total) by mouth every 7 (seven) days.  Dispense: 4 capsule; Refill: 2

## 2018-11-09 ENCOUNTER — Encounter: Payer: Self-pay | Admitting: Family Medicine

## 2018-11-10 ENCOUNTER — Other Ambulatory Visit: Payer: Self-pay | Admitting: Family Medicine

## 2018-11-10 MED ORDER — IBUPROFEN 800 MG PO TABS: 800.0000 mg | ORAL_TABLET | ORAL | 0 refills | Status: DC | PRN

## 2018-11-10 NOTE — Telephone Encounter (Signed)
#  [phone] sent in

## 2018-11-25 ENCOUNTER — Other Ambulatory Visit: Payer: Self-pay | Admitting: Family Medicine

## 2018-11-27 NOTE — Telephone Encounter (Signed)
[facility] never received rx that we sent in 09/08/2018. Rx resend.

## 2018-12-13 ENCOUNTER — Other Ambulatory Visit: Payer: Self-pay

## 2018-12-13 ENCOUNTER — Encounter: Payer: Self-pay | Admitting: Family Medicine

## 2018-12-13 ENCOUNTER — Ambulatory Visit (INDEPENDENT_AMBULATORY_CARE_PROVIDER_SITE_OTHER): Payer: 59 | Admitting: Family Medicine

## 2018-12-13 VITALS — BP 132/80 | HR 94 | Ht 64.0 in | Wt 233.0 lb

## 2018-12-13 DIAGNOSIS — F419 Anxiety disorder, unspecified: Secondary | ICD-10-CM | POA: Diagnosis not present

## 2018-12-13 DIAGNOSIS — F339 Major depressive disorder, recurrent, unspecified: Secondary | ICD-10-CM

## 2018-12-13 MED ORDER — VIIBRYD 10 MG PO TABS: ORAL_TABLET | ORAL | 3 refills | Status: DC

## 2018-12-13 NOTE — Progress Notes (Signed)
Chief Complaint  Patient presents with  . Follow-up    HPI: Alejandra Miller is a 58 y.o. female here to f/u on anxiety/depression. Pt was last seen by me in 09/2018 and her symptoms were well-controlled on wellbutrin XL 300mg  daily.  Pt feels like she is overall doing "much better" - less easily agitated or irritated, less depressed.  Husband reminded pt that when she was on it in the past, she slowly developed a flat affect. Pt does not recall this, however she  She has been on prozac, lexapro, wellbutrin, paxil, effexor in the past.   Depression screen [facility] 2/9 12/13/2018 10/26/2018 03/13/2018  Decreased Interest 0 1 3  Down, Depressed, Hopeless 0 1 3  PHQ - 2 Score 0 2 6  Altered sleeping 0 0 3  Tired, decreased energy 1 1 3   Change in appetite 0 0 3  Feeling bad or failure about yourself  1 0 2  Trouble concentrating 0 0 1  Moving slowly or fidgety/restless - 0 0  Suicidal thoughts 0 0 0  PHQ-9 Score 2 3 18   Difficult doing work/chores - - Somewhat difficult    GAD 7 : Generalized Anxiety Score 10/26/2018  Nervous, Anxious, on Edge 1  Control/stop worrying 0  Worry too much - different things 1  Trouble relaxing 1  Restless 0  Easily annoyed or irritable 3  Afraid - awful might happen 0  Total GAD 7 Score 6      Past Medical History:  Diagnosis Date  . Anxiety    NO MEDS  . Arthritis   . Back pain   . Cervical cancer screening 07/20/2012   Menarche at 13, regular til recently No h/o abnormal paps, last pap 2-3 years ago G0P0  MGM today, no h/o abnormal MGMs Spotting now monthly and scant H/o uterine polyps  . Depression    counseling in 2010 for 2 months on medication  . Fibrocystic breast 1988  . Fibroids 2014  . Fracture of right fibula 05/19/2015   October 2016  . GERD (gastroesophageal reflux disease) 2005   treated with omeprazole  . High cholesterol 2002  . History of sinusitis    once/twice per year  . Hyperglycemia 05/19/2015  . Hypertension 2002  .  Joint pain   . MRSA (methicillin resistant staph aureus) culture positive 2007  . Muscular pain   . Obesity   . OSA (obstructive sleep apnea)    +sleep study 08/2010., DOES NOT USE CPAP, USES MOSES MOUTH PIECE  . Osteoarthritis    KNEES, HANDS, HIPS  . Osteoporosis   . Plantar fasciitis of left foot 03/2014  . Seasonal allergies    affected in the spring  . Sleep apnea    mouthpiece "[place]"  . Uterine polyp   . 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 Vitamin D deficiency   . Wrist fracture 07/2012   right    Past Surgical History:  Procedure Laterality Date  . DILATATION & CURETTAGE/HYSTEROSCOPY WITH MYOSURE N/A 07/30/2014   Procedure: DILATATION & CURETTAGE/HYSTEROSCOPY WITH MYOSURE;  Surgeon: Nunzio Cobbs, MD;  Location: [facility];  Service: Gynecology;  Laterality: N/A;  extra time for BMI 49.  Marland Kitchen DILATATION & CURRETTAGE/HYSTEROSCOPY WITH RESECTOCOPE N/A 11/24/2012   Procedure: DILATATION & CURETTAGE/HYSTEROSCOPY WITH RESECTOCOPE;  Surgeon: Arloa Koh, MD;  Location: [facility];  Service: Gynecology;  Laterality: N/A;  . ESSURE TUBAL LIGATION Left   .  TONSILLECTOMY  1969  . WISDOM TOOTH EXTRACTION  1979  . WRIST FRACTURE SURGERY Right 07/2012    Social History   Socioeconomic History  . Marital status: Married    Spouse name: Rexann Marchesani  . Number of children: Not on file  . Years of education: Not on file  . Highest education level: Not on file  Occupational History  . Not on file  Social Needs  . Financial resource strain: Not on file  . Food insecurity    Worry: Not on file    Inability: Not on file  . Transportation needs    Medical: Not on file    Non-medical: Not on file  Tobacco Use  . Smoking status: Former Smoker    Packs/day: 0.50    Years: 6.00    Pack years: 3.00    Types: Cigarettes    Quit date: 03/01/1989    Years since quitting: 29.8  . Smokeless tobacco: Never Used  . Tobacco comment: Quit in 1991- started in  1983 1/2ppd  Substance and Sexual Activity  . Alcohol use: No    Alcohol/week: 0.0 standard drinks  . Drug use: No  . Sexual activity: Yes    Partners: Male    Birth control/protection: Other-see comments, I.U.D.    Comment: vasectomy/Mirena IUD inserted 03-04-16  Lifestyle  . Physical activity    Days per week: Not on file    Minutes per session: Not on file  . Stress: Not on file  Relationships  . Social Herbalist on phone: Not on file    Gets together: Not on file    Attends religious service: Not on file    Active member of club or organization: Not on file    Attends meetings of clubs or organizations: Not on file    Relationship status: Not on file  . Intimate partner violence    Fear of current or ex partner: Not on file    Emotionally abused: Not on file    Physically abused: Not on file    Forced sexual activity: Not on file  Other Topics Concern  . Not on file  Social History Narrative  . Not on file    Family History  Problem Relation Age of Onset  . Osteoarthritis Mother   . Hyperlipidemia Mother   . Depression Mother   . Alzheimer's disease Mother   . Hypertension Mother   . Sleep apnea Mother   . Obesity Mother   . Liver cancer Maternal Grandmother        mets to breast  . Breast cancer Maternal Grandmother        liver mets to breast  . Cancer Maternal Grandmother        with mets  . Hyperlipidemia Father   . Coronary artery disease Father   . Hypertension Father   . Stroke Father        multiple  . Diabetes Father   . Obesity Father   . Sleep apnea Father   . Colon cancer Neg Hx      Immunization History  Administered Date(s) Administered  . Influenza Split 12/06/2011  . Influenza Whole 01/02/2013  . Influenza,inj,Quad PF,6+ Mos 11/02/2013, 11/23/2016, 12/10/2017, 10/26/2018  . Influenza-Unspecified 12/04/2014, 03/03/2016, 11/23/2016, 12/10/2017  . Pneumococcal Conjugate-13 01/22/2013  . Tdap 03/05/2002, 08/02/2012, 01/22/2013     Outpatient Encounter Medications as of 12/13/2018  Medication Sig  . buPROPion (WELLBUTRIN XL) 300 MG 24 hr tablet Take 1 tablet (  300 mg total) by mouth daily.  Marland Kitchen CALCIUM PO Take 1 tablet by mouth 2 (two) times daily.   . Coenzyme Q10 (CO Q 10 PO) Take by mouth daily.  . cyclobenzaprine (FLEXERIL) 10 MG tablet TAKE 1 TABLET BY MOUTH EVERY DAY AS NEEDED  . guaiFENesin-codeine (CHERATUSSIN AC) 100-10 MG/5ML syrup Take 5 mLs by mouth 3 (three) times daily as needed for cough.  Marland Kitchen ibuprofen (ADVIL) 800 MG tablet Take 1 tablet (800 mg total) by mouth as needed.  Marland Kitchen levonorgestrel (MIRENA) 20 MCG/24HR IUD 1 each by Intrauterine route once.  Marland Kitchen lisinopril-hydrochlorothiazide (ZESTORETIC) 20-25 MG tablet Take 1 tablet by mouth daily.  Marland Kitchen MAGNESIUM PO Take 500 mg by mouth.  . Melatonin 3 MG TABS Take 1 tablet by mouth at bedtime.  . Multiple Vitamin (MULTIVITAMIN) tablet Take 1 tablet by mouth daily.    . mupirocin cream (BACTROBAN) 2 % Apply 1 application topically as needed.  . Omega-3 Fatty Acids (FISH OIL) 1000 MG CAPS Take 1,000 mg by mouth daily.  Marland Kitchen omeprazole (PRILOSEC) 20 MG capsule Take 20 mg by mouth daily as needed.  Marland Kitchen POTASSIUM PO Take 99 mg by mouth.  . Probiotic Product (PROBIOTIC PO) Take by mouth.  . Teriparatide, Recombinant, (FORTEO) 600 MCG/2.4ML SOLN Inject 20 mcg into the skin daily.  Marland Kitchen triamcinolone (NASACORT ALLERGY 24HR) 55 MCG/ACT AERO nasal inhaler Place 2 sprays into the nose daily.  . Vitamin D, Ergocalciferol, (DRISDOL) 1.25 MG (50000 UT) CAPS capsule Take 1 capsule (50,000 Units total) by mouth every 7 (seven) days.  . Zoledronic Acid (RECLAST IV) Inject into the vein as directed.  . [DISCONTINUED] methylPREDNISolone (MEDROL DOSEPAK) 4 MG TBPK tablet Take as directed   No facility-administered encounter medications on file as of 12/13/2018.      ROS: Pertinent positives and negatives noted in HPI. Remainder of ROS non-contributory  No Known Allergies  BP 132/80    Pulse 94   Ht 5\' 4"  (1.626 m)   Wt 233 lb (105.7 kg)   SpO2 98%   BMI 39.99 kg/m   Physical Exam  Constitutional: She is oriented to person, place, and time. She appears well-developed and well-nourished. No distress.  Neck: Neck supple. No thyromegaly present.  Cardiovascular: Normal rate, regular rhythm and normal heart sounds.  Pulmonary/Chest: Effort normal and breath sounds normal. No respiratory distress.  Lymphadenopathy:    She has no cervical adenopathy.  Neurological: She is alert and oriented to person, place, and time.  Psychiatric: She has a normal mood and affect. Her behavior is normal.     A/P:  1. Depression, recurrent ([place]) 2. Anxiety - controlled on wellbutrin but pts husband and therefore pt are concerned about "flat affect" and "no emotion" on this med in the past. Pt would like to switch to another med. She has been on multiple meds in the past - see HPI. Rx: - Vilazodone HCl (VIIBRYD) 10 MG TABS; 1 tab po x 7 days then increase to 2 tabs po daily  Dispense: 180 tablet; Refill: 3 - f/u in 4 wks or sooner PRN Discussed plan and reviewed medications with patient, including risks, benefits, and potential side effects. Pt expressed understand. All questions answered.

## 2018-12-13 NOTE — Patient Instructions (Signed)
Decrease welbutrin to 150mg  x 1 week starting tomorrow and then stop after 7 days

## 2018-12-14 ENCOUNTER — Other Ambulatory Visit: Payer: Self-pay

## 2018-12-14 MED ORDER — IBUPROFEN 800 MG PO TABS: 800.0000 mg | ORAL_TABLET | Freq: Three times a day (TID) | ORAL | 3 refills | Status: DC | PRN

## 2018-12-18 ENCOUNTER — Other Ambulatory Visit: Payer: Self-pay

## 2018-12-18 ENCOUNTER — Encounter: Payer: Self-pay | Admitting: Obstetrics and Gynecology

## 2018-12-18 ENCOUNTER — Ambulatory Visit (INDEPENDENT_AMBULATORY_CARE_PROVIDER_SITE_OTHER): Payer: 59 | Admitting: Obstetrics and Gynecology

## 2018-12-18 VITALS — BP 132/74 | HR 84 | Temp 97.3°F | Resp 14 | Ht 63.5 in | Wt 232.0 lb

## 2018-12-18 DIAGNOSIS — L989 Disorder of the skin and subcutaneous tissue, unspecified: Secondary | ICD-10-CM

## 2018-12-18 DIAGNOSIS — Z01419 Encounter for gynecological examination (general) (routine) without abnormal findings: Secondary | ICD-10-CM

## 2018-12-18 NOTE — Progress Notes (Signed)
58 y.o. G0P0 Married Caucasian female here for annual exam.    Doing weight loss through Dr. Leafy Ro, [place]. She is loosing on her own.  No exercise. She will start using her treadmill at home.   No vaginal bleeding. She denies hot flashes.  She stated she gets hot easily. She has some occasional cramping in the pelvis, but no bleeding.   Mother died in 2022/09/14, Alzheimer's. She started an antidepressant.  She is enjoying working at home.   She is on high dose vit D.  [facility] is prescribing.    PCP: Garvin Fila. Cirigliano, DO    Patient's last menstrual period was 09/30/2018 (approximate).           Sexually active: Yes.    The current method of family planning is vasectomy and Mirena IUD.   Placed 03/04/16.  Exercising: No.  The patient does not participate in regular exercise at present. Smoker:  no  Health Maintenance: Pap:  12-03-16 Neg:Neg HR HPV, 10-12-13 Neg:neg HR HPV.  Pap 07/20/12 - normal.  No HR HPV done.  History of abnormal Pap:  no MMG:  02/23/18 BIRADS 1 negative/density b Colonoscopy:  2015 normal BMD:   04/10/15  Result  Normal hx Osteoporosis:[place].  Status post 2 years of Forteo.  Did one injection of Zoledronic acid.  She has a hx of fragility fracture.   Last BMD was done in Feb. 2019. TDaP:  2014 Gardasil:   no HIV: Negative in 2006 Hep C: 05/19/15 Negative Screening Labs:  PCP Flu vaccine completed.   reports that she quit smoking about 29 years ago. Her smoking use included cigarettes. She has a 3.00 pack-year smoking history. She has never used smokeless tobacco. She reports current alcohol use. She reports that she does not use drugs.  Past Medical History:  Diagnosis Date  . Anxiety    NO MEDS  . Arthritis   . Back pain   . Cervical cancer screening 07/20/2012   Menarche at 13, regular til recently No h/o abnormal paps, last pap 2-3 years ago G0P0  MGM today, no h/o abnormal MGMs Spotting now monthly and scant H/o uterine polyps  .  Depression    counseling in 2010 for 2 months on medication  . Fibrocystic breast 1988  . Fibroids 2014  . Fracture of right fibula 05/19/2015   October 2016  . GERD (gastroesophageal reflux disease) 2005   treated with omeprazole  . High cholesterol 2002  . History of sinusitis    once/twice per year  . Hyperglycemia 05/19/2015  . Hypertension 2002  . Joint pain   . MRSA (methicillin resistant staph aureus) culture positive 2007  . Muscular pain   . Obesity   . OSA (obstructive sleep apnea)    +sleep study 08/2010., DOES NOT USE CPAP, USES MOSES MOUTH PIECE  . Osteoarthritis    KNEES, HANDS, HIPS  . Osteoporosis   . Plantar fasciitis of left foot 03/2014  . Seasonal allergies    affected in the spring  . Sleep apnea    mouthpiece "[facility]"  . Uterine polyp   . 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 Vitamin D deficiency   . Wrist fracture 09-13-2012   right    Past Surgical History:  Procedure Laterality Date  . DILATATION & CURETTAGE/HYSTEROSCOPY WITH MYOSURE N/A 07/30/2014   Procedure: DILATATION & CURETTAGE/HYSTEROSCOPY WITH MYOSURE;  Surgeon: Nunzio Cobbs, MD;  Location: [facility];  Service: Gynecology;  Laterality: N/A;  extra time for BMI 49.  Marland Kitchen DILATATION & CURRETTAGE/HYSTEROSCOPY WITH RESECTOCOPE N/A 11/24/2012   Procedure: DILATATION & CURETTAGE/HYSTEROSCOPY WITH RESECTOCOPE;  Surgeon: Arloa Koh, MD;  Location: [facility];  Service: Gynecology;  Laterality: N/A;  . ESSURE TUBAL LIGATION Left   . TONSILLECTOMY  1969  . WISDOM TOOTH EXTRACTION  1979  . WRIST FRACTURE SURGERY Right 07/2012    Current Outpatient Medications  Medication Sig Dispense Refill  . buPROPion (WELLBUTRIN XL) 300 MG 24 hr tablet Take 1 tablet (300 mg total) by mouth daily. 90 tablet 1  . CALCIUM PO Take 1 tablet by mouth 2 (two) times daily.     . Coenzyme Q10 (CO Q 10 PO) Take by mouth daily.    . cyclobenzaprine (FLEXERIL) 10 MG tablet TAKE 1 TABLET BY MOUTH  EVERY DAY AS NEEDED 90 tablet 1  . guaiFENesin-codeine (CHERATUSSIN AC) 100-10 MG/5ML syrup Take 5 mLs by mouth 3 (three) times daily as needed for cough. 120 mL 0  . ibuprofen (ADVIL) 800 MG tablet Take 1 tablet (800 mg total) by mouth every 8 (eight) hours as needed. 60 tablet 3  . levonorgestrel (MIRENA) 20 MCG/24HR IUD 1 each by Intrauterine route once.    Marland Kitchen lisinopril-hydrochlorothiazide (ZESTORETIC) 20-25 MG tablet Take 1 tablet by mouth daily. 90 tablet 3  . MAGNESIUM PO Take 500 mg by mouth.    . Melatonin 3 MG TABS Take 1 tablet by mouth at bedtime.    . Multiple Vitamin (MULTIVITAMIN) tablet Take 1 tablet by mouth daily.      . mupirocin cream (BACTROBAN) 2 % Apply 1 application topically as needed. 15 g 1  . Omega-3 Fatty Acids (FISH OIL) 1000 MG CAPS Take 1,000 mg by mouth daily.    Marland Kitchen omeprazole (PRILOSEC) 20 MG capsule Take 20 mg by mouth daily as needed.    Marland Kitchen POTASSIUM PO Take 99 mg by mouth.    . Probiotic Product (PROBIOTIC PO) Take by mouth.    . triamcinolone (NASACORT ALLERGY 24HR) 55 MCG/ACT AERO nasal inhaler Place 2 sprays into the nose daily.    . TURMERIC PO Take by mouth.    . Vilazodone HCl (VIIBRYD) 10 MG TABS 1 tab po x 7 days then increase to 2 tabs po daily 180 tablet 3  . Vitamin D, Ergocalciferol, (DRISDOL) 1.25 MG (50000 UT) CAPS capsule Take 1 capsule (50,000 Units total) by mouth every 7 (seven) days. 4 capsule 2   No current facility-administered medications for this visit.     Family History  Problem Relation Age of Onset  . Osteoarthritis Mother   . Hyperlipidemia Mother   . Depression Mother   . Alzheimer's disease Mother   . Hypertension Mother   . Sleep apnea Mother   . Obesity Mother   . Liver cancer Maternal Grandmother        mets to breast  . Breast cancer Maternal Grandmother        liver mets to breast  . Cancer Maternal Grandmother        with mets  . Hyperlipidemia Father   . Coronary artery disease Father   . Hypertension Father    . Stroke Father        multiple  . Diabetes Father   . Obesity Father   . Sleep apnea Father   . Colon cancer Neg Hx     Review of Systems  Constitutional: Negative.   HENT: Negative.  Eyes: Negative.   Respiratory: Negative.   Cardiovascular: Negative.   Gastrointestinal: Negative.   Endocrine: Negative.   Genitourinary: Negative.   Musculoskeletal: Negative.   Skin: Negative.   Allergic/Immunologic: Negative.   Neurological: Negative.   Hematological: Negative.   Psychiatric/Behavioral: Negative.     Exam:   BP 132/74 (BP Location: Right Arm, Patient Position: Sitting, Cuff Size: Large)   Pulse 84   Temp (!) 97.3 F (36.3 C) (Temporal)   Resp 14   Ht 5' 3.5" (1.613 m)   Wt 232 lb (105.2 kg)   LMP 09/30/2018 (Approximate)   BMI 40.45 kg/m     General appearance: alert, cooperative and appears stated age Head: normocephalic, without obvious abnormality, atraumatic Neck: no adenopathy, supple, symmetrical, trachea midline and thyroid normal to inspection and palpation Lungs: clear to auscultation bilaterally Breasts: normal appearance, no masses or tenderness, No nipple retraction or dimpling, No nipple discharge or bleeding, No axillary adenopathy Heart: regular rate and rhythm Abdomen: soft, non-tender; no masses, no organomegaly Extremities: extremities normal, atraumatic, no cyanosis or edema Skin: skin color, texture, turgor normal. No rashes or lesions Lymph nodes: cervical, supraclavicular, and axillary nodes normal. Neurologic: grossly normal  Pelvic: External genitalia:  7 mm pink and grey verrucous lesion left inferior vulva.                No abnormal inguinal nodes palpated.              Urethra:  normal appearing urethra with no masses, tenderness or lesions              Bartholins and Skenes: normal                 Vagina: normal appearing vagina with normal color and discharge, no lesions              Cervix: no lesions.  IUD strings noted.               Pap taken: No. Bimanual Exam:  Uterus:  normal size, contour, position, consistency, mobility, non-tender              Adnexa: no mass, fullness, tenderness              Rectal exam: Yes.  .  Confirms.              Anus:  normal sphincter tone, no lesions  Chaperone was present for exam.  Assessment:   Well woman visit with normal exam. Mirena IUD. Perimenopausal female.  Has unilateral Essure. HTN.  Anxiety.  Osteoporosis. Off Forteo.  Followed by osteoporosis clinic at [facility]. BMI 40. Hx MRSA. Vulvar skin lesion.  Plan: Mammogram screening discussed. Self breast awareness reviewed. Pap and HR HPV as above.  Testing due in 2023.  5 year risk of CIN 3 is 0.08%. Guidelines for Calcium, Vitamin D, regular exercise program including cardiovascular and weight bearing exercise. Labs with PCP and osteoporosis clinic. Return for vulvar biopsy, excision of skin lesion. Follow up annually and prn.   After visit summary provided.

## 2018-12-18 NOTE — Patient Instructions (Signed)

## 2018-12-22 ENCOUNTER — Telehealth: Payer: Self-pay | Admitting: Obstetrics and Gynecology

## 2018-12-22 NOTE — Telephone Encounter (Signed)
Call placed to convey benefits for vulva biopsy. Spoke with patient she understands/agreeable with the benefits. Patient is aware of the cancellation policy. Appointment scheduled 12/26/18 @2pm  with Dr. Quincy Simmonds.

## 2018-12-25 NOTE — Progress Notes (Signed)
urGYNECOLOGY  VISIT   HPI: 58 y.o.   Married  Caucasian  female   G0P0 with No LMP recorded. (Menstrual status: IUD).   here for left vulvar biopsy.   Patient is requesting excision of left vulvar nevus noted at the time of her annual exam.   GYNECOLOGIC HISTORY: No LMP recorded. (Menstrual status: IUD). Contraception: vasectomy/Mirena IUD Menopausal hormone therapy:  NA Last mammogram: 02/23/18 BIRADS 1 negative/density b Last pap smear: 12-03-16 Neg:Neg HR HPV, 10-12-13 Neg:neg HR HPV.  Pap 07/20/12 - normal.  No HR HPV done        OB History    Gravida  0   Para      Term      Preterm      AB      Living        SAB      TAB      Ectopic      Multiple      Live Births                 Patient Active Problem List   Diagnosis Date Noted  . Insulin resistance 04/10/2018  . Class 3 severe obesity with serious comorbidity and body mass index (BMI) of 40.0 to 44.9 in adult ([place]) 04/10/2018  . Osteoarthritis   . Fracture of right fibula 05/19/2015  . h/o Hyperglycemia 05/19/2015  . Bursitis of shoulder 11/05/2013  . Cervical cancer screening 07/20/2012  . BMI 40.0-44.9, adult ([place]) 02/12/2012  . Leg swelling 12/08/2011  . Annual physical exam 07/28/2011  . Menstrual irregularity 12/15/2010  . Vitamin D deficiency 12/15/2010  . h/o OSA (obstructive sleep apnea) 08/11/2010  . Arthralgia 07/28/2010  . Hyperlipidemia 07/28/2010  . Hypertension 07/28/2010  . h/o Depression 07/28/2010    Past Medical History:  Diagnosis Date  . Anxiety    NO MEDS  . Arthritis   . Back pain   . Cervical cancer screening 07/20/2012   Menarche at 13, regular til recently No h/o abnormal paps, last pap 2-3 years ago G0P0  MGM today, no h/o abnormal MGMs Spotting now monthly and scant H/o uterine polyps  . Depression    counseling in 2010 for 2 months on medication  . Fibrocystic breast 1988  . Fibroids 2014  . Fracture of right fibula 05/19/2015   October 2016  . GERD  (gastroesophageal reflux disease) 2005   treated with omeprazole  . High cholesterol 2002  . History of sinusitis    once/twice per year  . Hyperglycemia 05/19/2015  . Hypertension 2002  . Joint pain   . MRSA (methicillin resistant staph aureus) culture positive 2007  . Muscular pain   . Obesity   . OSA (obstructive sleep apnea)    +sleep study 08/2010., DOES NOT USE CPAP, USES MOSES MOUTH PIECE  . Osteoarthritis    KNEES, HANDS, HIPS  . Osteoporosis   . Plantar fasciitis of left foot 03/2014  . Seasonal allergies    affected in the spring  . Sleep apnea    mouthpiece "[facility]"  . Uterine polyp   . 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 Vitamin D deficiency   . Wrist fracture 07/2012   right    Past Surgical History:  Procedure Laterality Date  . DILATATION & CURETTAGE/HYSTEROSCOPY WITH MYOSURE N/A 07/30/2014   Procedure: DILATATION & CURETTAGE/HYSTEROSCOPY WITH MYOSURE;  Surgeon: Nunzio Cobbs, MD;  Location: [facility];  Service: Gynecology;  Laterality: N/A;  extra time for BMI 49.  Marland Kitchen DILATATION & CURRETTAGE/HYSTEROSCOPY WITH RESECTOCOPE N/A 11/24/2012   Procedure: DILATATION & CURETTAGE/HYSTEROSCOPY WITH RESECTOCOPE;  Surgeon: Arloa Koh, MD;  Location: [facility];  Service: Gynecology;  Laterality: N/A;  . ESSURE TUBAL LIGATION Left   . TONSILLECTOMY  1969  . WISDOM TOOTH EXTRACTION  1979  . WRIST FRACTURE SURGERY Right 07/2012    Current Outpatient Medications  Medication Sig Dispense Refill  . buPROPion (WELLBUTRIN XL) 300 MG 24 hr tablet Take 1 tablet (300 mg total) by mouth daily. 90 tablet 1  . CALCIUM PO Take 1 tablet by mouth 2 (two) times daily.     . Coenzyme Q10 (CO Q 10 PO) Take by mouth daily.    . cyclobenzaprine (FLEXERIL) 10 MG tablet TAKE 1 TABLET BY MOUTH EVERY DAY AS NEEDED 90 tablet 1  . guaiFENesin-codeine (CHERATUSSIN AC) 100-10 MG/5ML syrup Take 5 mLs by mouth 3 (three) times daily as needed for cough. 120 mL 0  .  ibuprofen (ADVIL) 800 MG tablet Take 1 tablet (800 mg total) by mouth every 8 (eight) hours as needed. 60 tablet 3  . levonorgestrel (MIRENA) 20 MCG/24HR IUD 1 each by Intrauterine route once.    Marland Kitchen lisinopril-hydrochlorothiazide (ZESTORETIC) 20-25 MG tablet Take 1 tablet by mouth daily. 90 tablet 3  . MAGNESIUM PO Take 500 mg by mouth.    . Melatonin 3 MG TABS Take 1 tablet by mouth at bedtime.    . Multiple Vitamin (MULTIVITAMIN) tablet Take 1 tablet by mouth daily.      . mupirocin cream (BACTROBAN) 2 % Apply 1 application topically as needed. 15 g 1  . Omega-3 Fatty Acids (FISH OIL) 1000 MG CAPS Take 1,000 mg by mouth daily.    Marland Kitchen omeprazole (PRILOSEC) 20 MG capsule Take 20 mg by mouth daily as needed.    Marland Kitchen POTASSIUM PO Take 99 mg by mouth.    . Probiotic Product (PROBIOTIC PO) Take by mouth.    . triamcinolone (NASACORT ALLERGY 24HR) 55 MCG/ACT AERO nasal inhaler Place 2 sprays into the nose daily.    . TURMERIC PO Take by mouth.    . Vilazodone HCl (VIIBRYD) 10 MG TABS 1 tab po x 7 days then increase to 2 tabs po daily 180 tablet 3  . Vitamin D, Ergocalciferol, (DRISDOL) 1.25 MG (50000 UT) CAPS capsule Take 1 capsule (50,000 Units total) by mouth every 7 (seven) days. 4 capsule 2   No current facility-administered medications for this visit.      ALLERGIES: Patient has no known allergies.  Family History  Problem Relation Age of Onset  . Osteoarthritis Mother   . Hyperlipidemia Mother   . Depression Mother   . Alzheimer's disease Mother   . Hypertension Mother   . Sleep apnea Mother   . Obesity Mother   . Liver cancer Maternal Grandmother        mets to breast  . Breast cancer Maternal Grandmother        liver mets to breast  . Cancer Maternal Grandmother        with mets  . Hyperlipidemia Father   . Coronary artery disease Father   . Hypertension Father   . Stroke Father        multiple  . Diabetes Father   . Obesity Father   . Sleep apnea Father   . Colon cancer Neg  Hx     Social History   Socioeconomic History  .  Marital status: Married    Spouse name: Anjenette Jagoe  . Number of children: Not on file  . Years of education: Not on file  . Highest education level: Not on file  Occupational History  . Not on file  Social Needs  . Financial resource strain: Not on file  . Food insecurity    Worry: Not on file    Inability: Not on file  . Transportation needs    Medical: Not on file    Non-medical: Not on file  Tobacco Use  . Smoking status: Former Smoker    Packs/day: 0.50    Years: 6.00    Pack years: 3.00    Types: Cigarettes    Quit date: 03/01/1989    Years since quitting: 29.8  . Smokeless tobacco: Never Used  . Tobacco comment: Quit in 1991- started in 1983 1/2ppd  Substance and Sexual Activity  . Alcohol use: Yes    Alcohol/week: 0.0 standard drinks    Comment: Occasional  . Drug use: No  . Sexual activity: Yes    Partners: Male    Birth control/protection: Other-see comments, I.U.D.    Comment: vasectomy/Mirena IUD inserted 03-04-16  Lifestyle  . Physical activity    Days per week: Not on file    Minutes per session: Not on file  . Stress: Not on file  Relationships  . Social Herbalist on phone: Not on file    Gets together: Not on file    Attends religious service: Not on file    Active member of club or organization: Not on file    Attends meetings of clubs or organizations: Not on file    Relationship status: Not on file  . Intimate partner violence    Fear of current or ex partner: Not on file    Emotionally abused: Not on file    Physically abused: Not on file    Forced sexual activity: Not on file  Other Topics Concern  . Not on file  Social History Narrative  . Not on file    Review of Systems  All other systems reviewed and are negative.   PHYSICAL EXAMINATION:    BP 132/78 (Cuff Size: Large)   Pulse 88   Temp (!) 97.2 F (36.2 C) (Temporal)   Ht 5' 3.5" (1.613 m)   Wt 232 lb (105.2 kg)    BMI 40.45 kg/m     General appearance: alert, cooperative and appears stated age    Pelvic: External genitalia:  7 mm grey verrucous raised lesion of left inferior labia majora.  Procedure - excision of vulvar lesion Consent for procedure.  Sterile prep with betadine.  Local 1% lidocaine - lot [id number], exp 4/22. Scalpel used to excise lesion.  Sent to pathology.  3 interrupted 3/0 Vicryl sutures placed.  Sterile gauze bandage.  Minimal EBL.  No complications.              Chaperone was present for exam.  ASSESSMENT  Excision of 7 mm vulvar lesion.  PLAN  Post biopsy instructions given.  Specimen to pathology.  FU for suture removal if become bothersome and last over 2 weeks.    An After Visit Summary was printed and given to the patient.

## 2018-12-26 ENCOUNTER — Other Ambulatory Visit: Payer: Self-pay

## 2018-12-26 ENCOUNTER — Other Ambulatory Visit: Payer: Self-pay | Admitting: Obstetrics and Gynecology

## 2018-12-26 ENCOUNTER — Ambulatory Visit: Payer: 59 | Admitting: Obstetrics and Gynecology

## 2018-12-26 ENCOUNTER — Encounter: Payer: Self-pay | Admitting: Obstetrics and Gynecology

## 2018-12-26 DIAGNOSIS — L989 Disorder of the skin and subcutaneous tissue, unspecified: Secondary | ICD-10-CM | POA: Diagnosis not present

## 2018-12-26 NOTE — Patient Instructions (Signed)
Excision of Skin Lesions, Care After This sheet gives you information about how to care for yourself after your procedure. Your health care provider may also give you more specific instructions. If you have problems or questions, contact your health care provider. What can I expect after the procedure? After your procedure, it is common to have pain or discomfort at the excision site. Follow these instructions at home: Excision care   Follow instructions from your health care provider about how to take care of your excision site. Make sure you: ? Wash your hands with soap and water before and after you change your bandage (dressing). If soap and water are not available, use hand sanitizer. ? Change your dressing as told by your health care provider. ? Leave stitches (sutures), skin glue, or adhesive strips in place. These skin closures may need to stay in place for 2 weeks or longer. If adhesive strip edges start to loosen and curl up, you may trim the loose edges. Do not remove adhesive strips completely unless your health care provider tells you to do that.  Check the excision area every day for signs of infection. Watch for: ? Redness, swelling, or pain. ? Fluid or blood. ? Warmth. ? Pus or a bad smell.  Keep the site clean, dry, and protected for at least 48 hours.  For bleeding, apply gentle but firm pressure to the area using a folded towel for 20 minutes.  Avoid high-impact exercise and activities until the sutures are removed or the area heals. General instructions  Take over-the-counter and prescription medicines only as told by your health care provider.  Follow instructions from your health care provider about how to minimize scarring. Scarring should lessen over time.  Avoid sun exposure until the area has healed. Use sunscreen to protect the area from the sun after it has healed.  Keep all follow-up visits as told by your health care provider. This is important. Contact  a health care provider if:  You have redness, swelling, or pain around your excision site.  You have fluid or blood coming from your excision site.  Your excision site feels warm to the touch.  You have pus or a bad smell coming from your excision site.  You have a fever.  You have pain that does not improve in 2-3 days after your procedure.  You notice skin irregularities or changes in how you feel (sensation). Summary  This sheet of instructions provides you with information about caring for yourself after your procedure. Contact your health care provider if you have any problems or questions.  Take over-the-counter and prescription medicines only as told by your health care provider.  Change your dressing as told by your health care provider.  Contact a health care provider if you have redness, swelling, pain, or other signs of infection around your excision site.  Keep all follow-up visits as told by your health care provider. This is important. This information is not intended to replace advice given to you by your health care provider. Make sure you discuss any questions you have with your health care provider. Document Released: 07/02/2014 Document Revised: 08/24/2017 Document Reviewed: 08/24/2017 Elsevier Patient Education  2020 [employer]  

## 2019-01-02 ENCOUNTER — Other Ambulatory Visit: Payer: Self-pay

## 2019-01-02 ENCOUNTER — Telehealth (INDEPENDENT_AMBULATORY_CARE_PROVIDER_SITE_OTHER): Payer: 59 | Admitting: Obstetrics and Gynecology

## 2019-01-02 ENCOUNTER — Encounter: Payer: Self-pay | Admitting: Obstetrics and Gynecology

## 2019-01-02 ENCOUNTER — Telehealth: Payer: Self-pay | Admitting: Obstetrics and Gynecology

## 2019-01-02 VITALS — Ht 63.5 in

## 2019-01-02 DIAGNOSIS — A630 Anogenital (venereal) warts: Secondary | ICD-10-CM

## 2019-01-02 NOTE — Progress Notes (Signed)
GYNECOLOGY  VISIT   HPI: 58 y.o.   Married  Caucasian  female   G0P0 with No LMP recorded. (Menstrual status: IUD).   here for televisit to discuss pathology results.  She had excision of a vulvar lesion, which on final pathology report is condyloma acuminata.   She gives permission for the telephone visit.  Glorianne Manchester RN arranged for the visit.  The My Chart video system does not appear to be functioning at this time.   I am in my office.  She is at her office at her home.  Started 2:50.    Ended 3:08.  She denies a history of genital warts. She had an abnormal pap many years ago.  She was told it was not a good sample.  GYNECOLOGIC HISTORY: No LMP recorded. (Menstrual status: IUD). Contraception:  vasectomy/Mirena IUD Menopausal hormone therapy:  bibe Last mammogram: 02/23/18 BIRADS 1 negative/density b Last pap smear: 12-03-16 Neg:Neg HR HPV, 10-12-13 Neg:neg HR HPV. Pap 07/20/12 - normal. No HR HPV done        OB History    Gravida  0   Para      Term      Preterm      AB      Living        SAB      TAB      Ectopic      Multiple      Live Births                 Patient Active Problem List   Diagnosis Date Noted  . Insulin resistance 04/10/2018  . Class 3 severe obesity with serious comorbidity and body mass index (BMI) of 40.0 to 44.9 in adult ([place]) 04/10/2018  . Osteoarthritis   . Fracture of right fibula 05/19/2015  . h/o Hyperglycemia 05/19/2015  . Bursitis of shoulder 11/05/2013  . Cervical cancer screening 07/20/2012  . BMI 40.0-44.9, adult ([place]) 02/12/2012  . Leg swelling 12/08/2011  . Annual physical exam 07/28/2011  . Menstrual irregularity 12/15/2010  . Vitamin D deficiency 12/15/2010  . h/o OSA (obstructive sleep apnea) 08/11/2010  . Arthralgia 07/28/2010  . Hyperlipidemia 07/28/2010  . Hypertension 07/28/2010  . h/o Depression 07/28/2010    Past Medical History:  Diagnosis Date  . Anxiety    NO MEDS  . Arthritis   .  Back pain   . Cervical cancer screening 07/20/2012   Menarche at 13, regular til recently No h/o abnormal paps, last pap 2-3 years ago G0P0  MGM today, no h/o abnormal MGMs Spotting now monthly and scant H/o uterine polyps  . Condyloma acuminata 2020  . Depression    counseling in 2010 for 2 months on medication  . Fibrocystic breast 1988  . Fibroids 2014  . Fracture of right fibula 05/19/2015   October 2016  . GERD (gastroesophageal reflux disease) 2005   treated with omeprazole  . High cholesterol 2002  . History of sinusitis    once/twice per year  . Hyperglycemia 05/19/2015  . Hypertension 2002  . Joint pain   . MRSA (methicillin resistant staph aureus) culture positive 2007  . Muscular pain   . Obesity   . OSA (obstructive sleep apnea)    +sleep study 08/2010., DOES NOT USE CPAP, USES MOSES MOUTH PIECE  . Osteoarthritis    KNEES, HANDS, HIPS  . Osteoporosis   . Plantar fasciitis of left foot 03/2014  . Seasonal allergies    affected in  the spring  . Sleep apnea    mouthpiece "[facility]"  . Uterine polyp   . 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 Vitamin D deficiency   . Wrist fracture 07/2012   right    Past Surgical History:  Procedure Laterality Date  . DILATATION & CURETTAGE/HYSTEROSCOPY WITH MYOSURE N/A 07/30/2014   Procedure: DILATATION & CURETTAGE/HYSTEROSCOPY WITH MYOSURE;  Surgeon: Nunzio Cobbs, MD;  Location: [facility];  Service: Gynecology;  Laterality: N/A;  extra time for BMI 49.  Marland Kitchen DILATATION & CURRETTAGE/HYSTEROSCOPY WITH RESECTOCOPE N/A 11/24/2012   Procedure: DILATATION & CURETTAGE/HYSTEROSCOPY WITH RESECTOCOPE;  Surgeon: Arloa Koh, MD;  Location: [facility];  Service: Gynecology;  Laterality: N/A;  . ESSURE TUBAL LIGATION Left   . TONSILLECTOMY  1969  . WISDOM TOOTH EXTRACTION  1979  . WRIST FRACTURE SURGERY Right 07/2012    Current Outpatient Medications  Medication Sig Dispense Refill  . buPROPion (WELLBUTRIN XL) 300  MG 24 hr tablet Take 1 tablet (300 mg total) by mouth daily. 90 tablet 1  . CALCIUM PO Take 1 tablet by mouth 2 (two) times daily.     . Coenzyme Q10 (CO Q 10 PO) Take by mouth daily.    . cyclobenzaprine (FLEXERIL) 10 MG tablet TAKE 1 TABLET BY MOUTH EVERY DAY AS NEEDED 90 tablet 1  . guaiFENesin-codeine (CHERATUSSIN AC) 100-10 MG/5ML syrup Take 5 mLs by mouth 3 (three) times daily as needed for cough. 120 mL 0  . ibuprofen (ADVIL) 800 MG tablet Take 1 tablet (800 mg total) by mouth every 8 (eight) hours as needed. 60 tablet 3  . levonorgestrel (MIRENA) 20 MCG/24HR IUD 1 each by Intrauterine route once.    Marland Kitchen lisinopril-hydrochlorothiazide (ZESTORETIC) 20-25 MG tablet Take 1 tablet by mouth daily. 90 tablet 3  . MAGNESIUM PO Take 500 mg by mouth.    . Melatonin 3 MG TABS Take 1 tablet by mouth at bedtime.    . Multiple Vitamin (MULTIVITAMIN) tablet Take 1 tablet by mouth daily.      . mupirocin cream (BACTROBAN) 2 % Apply 1 application topically as needed. 15 g 1  . Omega-3 Fatty Acids (FISH OIL) 1000 MG CAPS Take 1,000 mg by mouth daily.    Marland Kitchen omeprazole (PRILOSEC) 20 MG capsule Take 20 mg by mouth daily as needed.    Marland Kitchen POTASSIUM PO Take 99 mg by mouth.    . Probiotic Product (PROBIOTIC PO) Take by mouth.    . triamcinolone (NASACORT ALLERGY 24HR) 55 MCG/ACT AERO nasal inhaler Place 2 sprays into the nose daily.    . TURMERIC PO Take by mouth.    . Vilazodone HCl (VIIBRYD) 10 MG TABS 1 tab po x 7 days then increase to 2 tabs po daily 180 tablet 3  . Vitamin D, Ergocalciferol, (DRISDOL) 1.25 MG (50000 UT) CAPS capsule Take 1 capsule (50,000 Units total) by mouth every 7 (seven) days. 4 capsule 2   No current facility-administered medications for this visit.      ALLERGIES: Patient has no known allergies.  Family History  Problem Relation Age of Onset  . Osteoarthritis Mother   . Hyperlipidemia Mother   . Depression Mother   . Alzheimer's disease Mother   . Hypertension Mother   .  Sleep apnea Mother   . Obesity Mother   . Liver cancer Maternal Grandmother        mets to breast  . Breast cancer Maternal Grandmother  liver mets to breast  . Cancer Maternal Grandmother        with mets  . Hyperlipidemia Father   . Coronary artery disease Father   . Hypertension Father   . Stroke Father        multiple  . Diabetes Father   . Obesity Father   . Sleep apnea Father   . Colon cancer Neg Hx     Social History   Socioeconomic History  . Marital status: Married    Spouse name: Kamyrn Snelson  . Number of children: Not on file  . Years of education: Not on file  . Highest education level: Not on file  Occupational History  . Not on file  Social Needs  . Financial resource strain: Not on file  . Food insecurity    Worry: Not on file    Inability: Not on file  . Transportation needs    Medical: Not on file    Non-medical: Not on file  Tobacco Use  . Smoking status: Former Smoker    Packs/day: 0.50    Years: 6.00    Pack years: 3.00    Types: Cigarettes    Quit date: 03/01/1989    Years since quitting: 29.8  . Smokeless tobacco: Never Used  . Tobacco comment: Quit in 1991- started in 1983 1/2ppd  Substance and Sexual Activity  . Alcohol use: Yes    Alcohol/week: 0.0 standard drinks    Comment: Occasional  . Drug use: No  . Sexual activity: Yes    Partners: Male    Birth control/protection: Other-see comments, I.U.D.    Comment: vasectomy/Mirena IUD inserted 03-04-16  Lifestyle  . Physical activity    Days per week: Not on file    Minutes per session: Not on file  . Stress: Not on file  Relationships  . Social Herbalist on phone: Not on file    Gets together: Not on file    Attends religious service: Not on file    Active member of club or organization: Not on file    Attends meetings of clubs or organizations: Not on file    Relationship status: Not on file  . Intimate partner violence    Fear of current or ex partner: Not on file     Emotionally abused: Not on file    Physically abused: Not on file    Forced sexual activity: Not on file  Other Topics Concern  . Not on file  Social History Narrative  . Not on file    Review of Systems  All other systems reviewed and are negative.   PHYSICAL EXAMINATION:    Ht 5' 3.5" (1.613 m)   BMI 40.45 kg/m     General appearance: alert, cooperative and appears stated age  ASSESSMENT  Condyloma acuminata.  PLAN  We had a comprehensive discussion of HPV subtypes and potential effects on tissues - dysplasia, cancer, and genital warts. We discussed Gardasil at her request.  I explained she is not a candidate for this as it is not treatment for condyloma but can reduce the risk of this prior to exposure.  She has aged out of this option as well.  I reassured her that the condyloma was entirely excised, but that this does not remove HPV which still present in genital tissues.  I encouraged her to have a healthy lifestyle to boost her immune system as a way to fight the virus.  She will continue yearly  annual exams and contact me if she notes any recurrences.  An After Visit Summary was printed and given to the patient.  ___18__ minutes face to face time over the phone.

## 2019-01-10 ENCOUNTER — Other Ambulatory Visit: Payer: Self-pay | Admitting: Obstetrics and Gynecology

## 2019-01-10 DIAGNOSIS — Z1231 Encounter for screening mammogram for malignant neoplasm of breast: Secondary | ICD-10-CM

## 2019-01-18 ENCOUNTER — Telehealth: Payer: Self-pay

## 2019-01-18 NOTE — Telephone Encounter (Signed)
Travel or Contacts:   Any travel in the past 2 weeks? NO  Have you came in contact with anyone who has Covid?NO Have you had a positive Covid test? If so, when?NO  Fever >100.44F []   Yes [x]   No []   Unknown  Chills []   Yes [x]   No []   Unknown  Muscle aches (myalgia) []   Yes [x]   No []   Unknown  Runny nose (rhinorrhea) []   Yes [x]   No []   Unknown  Sore throat []   Yes [x]   No []   Unknown Cough (new onset or worsening of chronic cough) []   Yes [x]   No []   Unknown  Shortness of breath (dyspnea) []   Yes [x]   No []   Unknown Nausea or vomiting []   Yes []   No []   Unknown  Headache []   Yes [x]   No []   Unknown  Abdominal pain  []   Yes [x]   No []   Unknown  Diarrhea (?3 loose/looser than normal stools/24hr period) []   Yes [x]   No []   Unknown Other, s:pecify

## 2019-01-19 ENCOUNTER — Other Ambulatory Visit: Payer: Self-pay

## 2019-01-19 ENCOUNTER — Encounter: Payer: Self-pay | Admitting: Family Medicine

## 2019-01-19 ENCOUNTER — Ambulatory Visit (INDEPENDENT_AMBULATORY_CARE_PROVIDER_SITE_OTHER): Payer: 59 | Admitting: Family Medicine

## 2019-01-19 VITALS — BP 140/100 | HR 107 | Temp 98.5°F | Ht 63.5 in | Wt 235.0 lb

## 2019-01-19 DIAGNOSIS — F419 Anxiety disorder, unspecified: Secondary | ICD-10-CM

## 2019-01-19 DIAGNOSIS — F339 Major depressive disorder, recurrent, unspecified: Secondary | ICD-10-CM | POA: Diagnosis not present

## 2019-01-19 DIAGNOSIS — E559 Vitamin D deficiency, unspecified: Secondary | ICD-10-CM | POA: Diagnosis not present

## 2019-01-19 MED ORDER — BUSPIRONE HCL 7.5 MG PO TABS: 7.5000 mg | ORAL_TABLET | Freq: Two times a day (BID) | ORAL | 3 refills | Status: DC

## 2019-01-19 MED ORDER — VIIBRYD 40 MG PO TABS: 40.0000 mg | ORAL_TABLET | Freq: Every day | ORAL | 3 refills | Status: DC

## 2019-01-19 MED ORDER — VITAMIN D (ERGOCALCIFEROL) 1.25 MG (50000 UNIT) PO CAPS: 50000.0000 [IU] | ORAL_CAPSULE | ORAL | 3 refills | Status: AC

## 2019-01-19 NOTE — Patient Instructions (Signed)
Mucinex D 1 tab twice per day x 5-7 days
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
## 2019-01-24 ENCOUNTER — Encounter: Payer: Self-pay | Admitting: Family Medicine

## 2019-01-24 MED ORDER — VIRTUSSIN A/C 100-10 MG/5ML PO SOLN: 5.0000 mL | Freq: Three times a day (TID) | ORAL | 0 refills | Status: DC | PRN

## 2019-02-08 ENCOUNTER — Encounter (INDEPENDENT_AMBULATORY_CARE_PROVIDER_SITE_OTHER): Payer: Self-pay | Admitting: Family Medicine

## 2019-02-15 ENCOUNTER — Ambulatory Visit (INDEPENDENT_AMBULATORY_CARE_PROVIDER_SITE_OTHER): Payer: 59 | Admitting: Family Medicine

## 2019-02-15 ENCOUNTER — Encounter (INDEPENDENT_AMBULATORY_CARE_PROVIDER_SITE_OTHER): Payer: Self-pay | Admitting: Family Medicine

## 2019-02-15 ENCOUNTER — Other Ambulatory Visit: Payer: Self-pay

## 2019-02-15 VITALS — BP 122/79 | HR 85 | Temp 98.1°F | Ht 63.0 in | Wt 238.0 lb

## 2019-02-15 DIAGNOSIS — Z6841 Body Mass Index (BMI) 40.0 and over, adult: Secondary | ICD-10-CM

## 2019-02-15 DIAGNOSIS — F339 Major depressive disorder, recurrent, unspecified: Secondary | ICD-10-CM

## 2019-02-20 NOTE — Telephone Encounter (Signed)
Erroneous encounter

## 2019-02-20 NOTE — Progress Notes (Signed)
Office: [phone]  /  Fax: [phone]   HPI:  Chief Complaint: OBESITY Alejandra Miller is here to discuss her progress with her obesity treatment plan. She is on the Category 2 plan +100 calories and states she is following her eating plan approximately 50 to 60 % of the time. She states she is exercising 0 minutes 0 times per week.  Alejandra Miller has not had an in-office visit since 05/10/18 and she has lost 9 lbs since then. She has continued to work on weight loss. Alejandra Miller has had a stressful year. Her mom passed, she had issues with her siblings, and her husband has asthma that has been poorly controlled. She denies polyphagia. Kayelynn reports gaining six pounds over the last month or two, partially from drinking sugar sweetened beverages.  Depression, recurrent Alejandra Miller has recurrent depression and she is being managed by her PCP for this. She was started on Viibryd and Buspar, which seems to be working well. She reports she is much better. Alejandra Miller is scheduled to see a therapist in January.  Today's visit was # 6  Starting weight: 264 lbs Starting date: 03/13/2018 Today's weight : 238 lbs Today's date: 02/15/2019 Total lbs lost to date: 26 Total lbs lost since last in-office visit: 9  ASSESSMENT AND PLAN:  Depression, recurrent ([place])  Class 3 severe obesity with serious comorbidity and body mass index (BMI) of 40.0 to 44.9 in adult, unspecified obesity type ([place])  PLAN:  Depression, recurrent Alejandra Miller will continue all medications and follow up with her PCP.  Obesity Alejandra Miller is currently in the action stage of change. As such, her goal is to continue with weight loss efforts She has agreed to follow the Category 3 plan We discussed the following Behavioral Modification Strategies today: planning for success, increasing lean protein intake and decreasing simple carbohydrates   Handouts for Category 3 were provided to patient today.  Alejandra Miller has agreed to follow up with our clinic in 4 weeks. She was informed  of the importance of frequent follow up visits to maximize her success with intensive lifestyle modifications for her multiple health conditions.  ALLERGIES: No Known Allergies  MEDICATIONS: Current Outpatient Medications on File Prior to Visit  Medication Sig Dispense Refill  . busPIRone (BUSPAR) 7.5 MG tablet Take 1 tablet (7.5 mg total) by mouth 2 (two) times daily. 180 tablet 3  . CALCIUM PO Take 1 tablet by mouth 2 (two) times daily.     . Coenzyme Q10 (CO Q 10 PO) Take by mouth daily.    . cyclobenzaprine (FLEXERIL) 10 MG tablet TAKE 1 TABLET BY MOUTH EVERY DAY AS NEEDED 90 tablet 1  . guaiFENesin-codeine (VIRTUSSIN A/C) 100-10 MG/5ML syrup Take 5 mLs by mouth 3 (three) times daily as needed for cough. 120 mL 0  . levonorgestrel (MIRENA) 20 MCG/24HR IUD 1 each by Intrauterine route once.    Marland Kitchen lisinopril-hydrochlorothiazide (ZESTORETIC) 20-25 MG tablet Take 1 tablet by mouth daily. 90 tablet 3  . MAGNESIUM PO Take 500 mg by mouth.    . Melatonin 3 MG TABS Take 1 tablet by mouth at bedtime.    . Multiple Vitamin (MULTIVITAMIN) tablet Take 1 tablet by mouth daily.      . mupirocin cream (BACTROBAN) 2 % Apply 1 application topically as needed. 15 g 1  . Omega-3 Fatty Acids (FISH OIL) 1000 MG CAPS Take 1,000 mg by mouth daily.    Marland Kitchen omeprazole (PRILOSEC) 20 MG capsule Take 20 mg by mouth daily as needed.    Marland Kitchen  POTASSIUM PO Take 99 mg by mouth.    . Probiotic Product (PROBIOTIC PO) Take by mouth.    . triamcinolone (NASACORT ALLERGY 24HR) 55 MCG/ACT AERO nasal inhaler Place 2 sprays into the nose daily.    . TURMERIC PO Take by mouth.    . Vilazodone HCl (VIIBRYD) 40 MG TABS Take 1 tablet (40 mg total) by mouth daily. 90 tablet 3  . Vitamin D, Ergocalciferol, (DRISDOL) 1.25 MG (50000 UT) CAPS capsule Take 1 capsule (50,000 Units total) by mouth every 7 (seven) days. 12 capsule 3   No current facility-administered medications on file prior to visit.    PAST MEDICAL HISTORY: Past Medical  History:  Diagnosis Date  . Anxiety    NO MEDS  . Arthritis   . Back pain   . Cervical cancer screening 07/20/2012   Menarche at 13, regular til recently No h/o abnormal paps, last pap 2-3 years ago G0P0  MGM today, no h/o abnormal MGMs Spotting now monthly and scant H/o uterine polyps  . Condyloma acuminata 2020  . Depression    counseling in 2010 for 2 months on medication  . Fibrocystic breast 1988  . Fibroids 2014  . Fracture of right fibula 05/19/2015   October 2016  . GERD (gastroesophageal reflux disease) 2005   treated with omeprazole  . High cholesterol 2002  . History of sinusitis    once/twice per year  . Hyperglycemia 05/19/2015  . Hypertension 2002  . Joint pain   . MRSA (methicillin resistant staph aureus) culture positive 2007  . Muscular pain   . Obesity   . OSA (obstructive sleep apnea)    +sleep study 08/2010., DOES NOT USE CPAP, USES MOSES MOUTH PIECE  . Osteoarthritis    KNEES, HANDS, HIPS  . Osteoporosis   . Plantar fasciitis of left foot 03/2014  . Seasonal allergies    affected in the spring  . Sleep apnea    mouthpiece "[place]"  . Uterine polyp   . 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 Vitamin D deficiency   . Wrist fracture 07/2012   right    PAST SURGICAL HISTORY: Past Surgical History:  Procedure Laterality Date  . DILATATION & CURETTAGE/HYSTEROSCOPY WITH MYOSURE N/A 07/30/2014   Procedure: DILATATION & CURETTAGE/HYSTEROSCOPY WITH MYOSURE;  Surgeon: Nunzio Cobbs, MD;  Location: [facility];  Service: Gynecology;  Laterality: N/A;  extra time for BMI 49.  Marland Kitchen DILATATION & CURRETTAGE/HYSTEROSCOPY WITH RESECTOCOPE N/A 11/24/2012   Procedure: DILATATION & CURETTAGE/HYSTEROSCOPY WITH RESECTOCOPE;  Surgeon: Arloa Koh, MD;  Location: [facility];  Service: Gynecology;  Laterality: N/A;  . ESSURE TUBAL LIGATION Left   . TONSILLECTOMY  1969  . WISDOM TOOTH EXTRACTION  1979  . WRIST FRACTURE SURGERY Right 07/2012     SOCIAL HISTORY: Social History   Tobacco Use  . Smoking status: Former Smoker    Packs/day: 0.50    Years: 6.00    Pack years: 3.00    Types: Cigarettes    Quit date: 03/01/1989    Years since quitting: 29.9  . Smokeless tobacco: Never Used  . Tobacco comment: Quit in 1991- started in 1983 1/2ppd  Substance Use Topics  . Alcohol use: Yes    Alcohol/week: 0.0 standard drinks    Comment: Occasional  . Drug use: No    FAMILY HISTORY: Family History  Problem Relation Age of Onset  . Osteoarthritis Mother   . Hyperlipidemia Mother   .  Depression Mother   . Alzheimer's disease Mother   . Hypertension Mother   . Sleep apnea Mother   . Obesity Mother   . Liver cancer Maternal Grandmother        mets to breast  . Breast cancer Maternal Grandmother        liver mets to breast  . Cancer Maternal Grandmother        with mets  . Hyperlipidemia Father   . Coronary artery disease Father   . Hypertension Father   . Stroke Father        multiple  . Diabetes Father   . Obesity Father   . Sleep apnea Father   . Colon cancer Neg Hx     ROS: Review of Systems  Constitutional: Positive for weight loss.  Endo/Heme/Allergies:       Negative for polyphagia  Psychiatric/Behavioral: Positive for depression.    PHYSICAL EXAM: Blood pressure 122/79, pulse 85, temperature 98.1 F (36.7 C), temperature source Oral, height 5\' 3"  (1.6 m), weight 238 lb (108 kg), SpO2 97 %. Body mass index is 42.16 kg/m. Physical Exam Vitals reviewed.  Constitutional:      General: She is not in acute distress.    Appearance: Normal appearance. She is well-developed. She is obese.  Cardiovascular:     Rate and Rhythm: Normal rate.  Pulmonary:     Effort: Pulmonary effort is normal.  Musculoskeletal:        General: Normal range of motion.  Skin:    General: Skin is warm and dry.  Neurological:     Mental Status: She is alert and oriented to person, place, and time.  Psychiatric:        Mood  and Affect: Mood normal.        Behavior: Behavior normal.     RECENT LABS AND TESTS: BMET    Component Value Date/Time   NA 145 09/07/2018 0946   NA 137 03/13/2018 1250   K 4.6 09/07/2018 0946   CL 104 09/07/2018 0946   CO2 30 09/07/2018 0946   GLUCOSE 104 (H) 09/07/2018 0946   BUN 16 09/07/2018 0946   BUN 13 03/13/2018 1250   CREATININE 0.90 09/07/2018 0946   CREATININE 0.91 10/29/2013 0843   CALCIUM 10.3 09/07/2018 0946   GFRNONAA 81 03/13/2018 1250   GFRNONAA >60 06/26/2010 1441   GFRAA 93 03/13/2018 1250   GFRAA >60 06/26/2010 1441   Lab Results  Component Value Date   HGBA1C 5.3 09/07/2018   HGBA1C 5.3 03/13/2018   HGBA1C 5.1 06/24/2016   HGBA1C 5.5 05/19/2015   Lab Results  Component Value Date   INSULIN 23.0 03/13/2018   CBC    Component Value Date/Time   WBC 8.3 03/13/2018 1250   WBC 9.8 01/02/2016 1439   RBC 5.36 (H) 03/13/2018 1250   RBC 4.92 01/02/2016 1439   HGB 15.3 03/13/2018 1250   HCT 45.8 03/13/2018 1250   PLT 372 06/24/2016 1139   MCV 85 03/13/2018 1250   MCH 28.5 03/13/2018 1250   MCH 28.0 01/02/2016 1439   MCHC 33.4 03/13/2018 1250   MCHC 33.3 01/02/2016 1439   RDW 13.1 03/13/2018 1250   LYMPHSABS 2.2 03/13/2018 1250   MONOABS 0.5 11/22/2014 0809   EOSABS 0.2 03/13/2018 1250   BASOSABS 0.1 03/13/2018 1250   Iron/TIBC/Ferritin/ %Sat No results found for: IRON, TIBC, FERRITIN, IRONPCTSAT Lipid Panel     Component Value Date/Time   CHOL 257 (H) 09/07/2018 0946   CHOL  278 (H) 03/13/2018 1250   TRIG 144.0 09/07/2018 0946   HDL 37.40 (L) 09/07/2018 0946   HDL 34 (L) 03/13/2018 1250   CHOLHDL 7 09/07/2018 0946   VLDL 28.8 09/07/2018 0946   LDLCALC 191 (H) 09/07/2018 0946   LDLCALC 210 (H) 03/13/2018 1250   Hepatic Function Panel     Component Value Date/Time   PROT 6.9 03/13/2018 1250   ALBUMIN 4.4 03/13/2018 1250   AST 23 09/07/2018 0946   ALT 35 09/07/2018 0946   ALKPHOS 65 03/13/2018 1250   BILITOT 0.6 03/13/2018 1250    BILIDIR 0.1 10/29/2013 0843   IBILI 0.4 10/29/2013 0843      Component Value Date/Time   TSH 0.910 03/13/2018 1250   TSH 1.170 06/24/2016 1139   TSH 1.28 11/22/2014 0809    Ref. Range 09/07/2018 09:46  VITD Latest Ref Range: 30.00 - 100.00 ng/mL 44.60    I, Doreene Nest, am acting as Location manager for [employer], FNP-C  I have reviewed the above documentation for accuracy and completeness, and I agree with the above.  - Jader Desai, FNP-C.

## 2019-02-25 ENCOUNTER — Encounter (INDEPENDENT_AMBULATORY_CARE_PROVIDER_SITE_OTHER): Payer: Self-pay | Admitting: Family Medicine

## 2019-03-05 ENCOUNTER — Other Ambulatory Visit: Payer: Self-pay

## 2019-03-05 ENCOUNTER — Ambulatory Visit
Admission: RE | Admit: 2019-03-05 | Discharge: 2019-03-05 | Disposition: A | Discharge status: Home / Self Care | Payer: 59 | Source: Ambulatory Visit | Attending: Obstetrics and Gynecology | Admitting: Obstetrics and Gynecology

## 2019-03-05 DIAGNOSIS — Z1231 Encounter for screening mammogram for malignant neoplasm of breast: Secondary | ICD-10-CM

## 2019-03-15 ENCOUNTER — Ambulatory Visit (INDEPENDENT_AMBULATORY_CARE_PROVIDER_SITE_OTHER): Payer: 59 | Admitting: Family Medicine

## 2019-03-18 ENCOUNTER — Other Ambulatory Visit: Payer: Self-pay | Admitting: Family Medicine

## 2019-03-19 NOTE — Telephone Encounter (Signed)
Last OV 01/19/19 Last fill 01/24/19  #17ml

## 2019-03-20 ENCOUNTER — Ambulatory Visit (INDEPENDENT_AMBULATORY_CARE_PROVIDER_SITE_OTHER): Payer: 59 | Admitting: Psychology

## 2019-03-20 DIAGNOSIS — F331 Major depressive disorder, recurrent, moderate: Secondary | ICD-10-CM

## 2019-03-20 DIAGNOSIS — F411 Generalized anxiety disorder: Secondary | ICD-10-CM | POA: Diagnosis not present

## 2019-03-20 DIAGNOSIS — F4310 Post-traumatic stress disorder, unspecified: Secondary | ICD-10-CM | POA: Diagnosis not present

## 2019-03-21 ENCOUNTER — Encounter: Payer: Self-pay | Admitting: Family Medicine

## 2019-03-21 DIAGNOSIS — J3089 Other allergic rhinitis: Secondary | ICD-10-CM

## 2019-03-23 ENCOUNTER — Encounter: Payer: Self-pay | Admitting: Family Medicine

## 2019-03-23 MED ORDER — BENZONATATE 100 MG PO CAPS: 100.0000 mg | ORAL_CAPSULE | Freq: Three times a day (TID) | ORAL | 1 refills | Status: DC | PRN

## 2019-03-23 MED ORDER — MONTELUKAST SODIUM 10 MG PO TABS: 10.0000 mg | ORAL_TABLET | Freq: Every day | ORAL | 3 refills | Status: DC

## 2019-03-27 ENCOUNTER — Ambulatory Visit (INDEPENDENT_AMBULATORY_CARE_PROVIDER_SITE_OTHER): Payer: 59 | Admitting: Psychology

## 2019-03-27 DIAGNOSIS — F4310 Post-traumatic stress disorder, unspecified: Secondary | ICD-10-CM

## 2019-03-27 DIAGNOSIS — F331 Major depressive disorder, recurrent, moderate: Secondary | ICD-10-CM

## 2019-03-27 DIAGNOSIS — F411 Generalized anxiety disorder: Secondary | ICD-10-CM | POA: Diagnosis not present

## 2019-03-29 ENCOUNTER — Ambulatory Visit (INDEPENDENT_AMBULATORY_CARE_PROVIDER_SITE_OTHER): Payer: 59 | Admitting: Family Medicine

## 2019-04-02 ENCOUNTER — Ambulatory Visit (INDEPENDENT_AMBULATORY_CARE_PROVIDER_SITE_OTHER): Payer: 59 | Admitting: Family Medicine

## 2019-04-02 ENCOUNTER — Other Ambulatory Visit: Payer: Self-pay

## 2019-04-02 ENCOUNTER — Encounter (INDEPENDENT_AMBULATORY_CARE_PROVIDER_SITE_OTHER): Payer: Self-pay | Admitting: Family Medicine

## 2019-04-02 VITALS — BP 127/83 | HR 83 | Temp 98.3°F | Ht 63.0 in | Wt 234.0 lb

## 2019-04-02 DIAGNOSIS — E7849 Other hyperlipidemia: Secondary | ICD-10-CM

## 2019-04-02 DIAGNOSIS — F339 Major depressive disorder, recurrent, unspecified: Secondary | ICD-10-CM

## 2019-04-02 DIAGNOSIS — E559 Vitamin D deficiency, unspecified: Secondary | ICD-10-CM | POA: Diagnosis not present

## 2019-04-02 DIAGNOSIS — E8881 Metabolic syndrome: Secondary | ICD-10-CM | POA: Diagnosis not present

## 2019-04-02 DIAGNOSIS — Z6841 Body Mass Index (BMI) 40.0 and over, adult: Secondary | ICD-10-CM

## 2019-04-02 NOTE — Progress Notes (Signed)
Chief Complaint:   OBESITY Alejandra Miller is here to discuss her progress with her obesity treatment plan along with follow-up of her obesity related diagnoses. [name] is on the Category 3 Plan and states she is following her eating plan approximately 0% of the time. [name] states she is exercising 0 minutes 0 times per week.  Today's visit was #: 7 Starting weight: 264 lbs Starting date: 03/13/2018 Today's weight: 234 lbs Today's date: 04/02/2019 Total lbs lost to date: 30 Total lbs lost since last in-office visit: 4  Interim History: [name] admits to not following the plan. Her appetite is affected by her depression. She does not like to cook. [name] is snacking but she is not eating any real meals. [name] often skips breakfast.  Subjective:   Vitamin D deficiency  [name]'s last Vitamin D level was 44.60 on 09/07/18 and was not at goal. She has a history of osteoporotic fracture. [name] has been on bisphosphonate therapy in the past. She is compliant with calcium 1,000 mg daily. She is on prescription vitamin D.   Other hyperlipidemia  [name]'s last LDL was very high at 191 (down from 210). She reports being on statins in the past which caused myalgias. She is not on stating currently. Ten year ASCVD risk is 6%. [name]'s HDL is low at 37.40 (09/07/18).   Lab Results  Component Value Date   CHOL 257 (H) 09/07/2018   HDL 37.40 (L) 09/07/2018   LDLCALC 191 (H) 09/07/2018   TRIG 144.0 09/07/2018   CHOLHDL 7 09/07/2018   Lab Results  Component Value Date   ALT 35 09/07/2018   AST 23 09/07/2018   ALKPHOS 65 03/13/2018   BILITOT 0.6 03/13/2018   The 10-year ASCVD risk score Alejandra Bussing DC Jr., et al., 2013) is: 6%   Values used to calculate the score:     Age: 59 years     Sex: Female     Is Non-Hispanic African American: No     Diabetic: No     Tobacco smoker: No     Systolic Blood Pressure: [id number] mmHg     Is BP treated: Yes     HDL Cholesterol: 37.4 mg/dL     Total Cholesterol: 257 mg/dL  Insulin  resistance [name] has a diagnosis of insulin resistance based on her elevated fasting insulin level (23). She is not on metformin. [name] denies polyphagia. She continues to work on diet and exercise to decrease her risk of diabetes.  Lab Results  Component Value Date   INSULIN 23.0 03/13/2018   Lab Results  Component Value Date   HGBA1C 5.3 09/07/2018   Depression Alejandra Miller is struggling with depression. She is seeing a Social worker weekly. She is taking Viibryd and Buspar which are prescribed by her PCP. She reports depression since the passing of her mother in the summer of 2020 with worsening in December 2020. She shows no sign of suicidal or homicidal ideations. She feels depression is improving.   Assessment/Plan:   Vitamin D deficiency  Low Vitamin D level contributes to fatigue and are associated with obesity, breast, and colon cancer. She will continue to take prescription Vitamin D @50 ,000 IU every week and we will check vitamin D level today. She will follow-up for routine testing of Vitamin D, at least 2-3 times per year to avoid over-replacement.  Other hyperlipidemia  Cardiovascular risk and specific lipid/LDL goals reviewed.  We discussed several lifestyle modifications today and Alejandra Miller will continue to work on diet,  exercise and weight loss efforts. We will check fasting lipid panel today. Orders and follow up as documented in patient record.    Insulin resistance [name] will continue to work on weight loss, exercise, and decreasing simple carbohydrates to help decrease the risk of diabetes. We will check fasting insulin, glucose and A1c today. [name] agreed to follow-up with [name] as directed to closely monitor her progress.  Depression Alejandra Miller will continue medications and she will continue weekly counseling. Orders and follow up as documented in patient record.    Class 3 severe obesity with serious comorbidity and body mass index (BMI) of 40.0 to 44.9 in adult, unspecified obesity type  ([facility]) Loyal is currently in the action stage of change. As such, her goal is to continue with weight loss efforts. She has agreed to practicing portion control and making smarter food choices, such as increasing vegetables and decreasing simple carbohydrates. I advised her to try to get 35 to 40 grams of protein at supper daily.   Exercise goals: No exercise has been prescribed at this time.  Behavioral modification strategies: increasing lean protein intake, no skipping meals and planning for success.  Journaling numbers were provided (1700 to 1800 calories and 100 grams of protein) as a guide but I advised her she does not need to journal at this time but rather focus on not skipping meals and increasing protein intake.  Meal breakdown was provided as a reference.  Handouts: Dining out, journaling, protein content.  Alejandra Miller has agreed to follow-up with our clinic in 3 weeks. She was informed of the importance of frequent follow-up visits to maximize her success with intensive lifestyle modifications for her multiple health conditions.   [name] was informed we would discuss her lab results at her next visit unless there is a critical issue that needs to be addressed sooner. [name] agreed to keep her next visit at the agreed upon time to discuss these results.  Objective:   Blood pressure 127/83, pulse 83, temperature 98.3 F (36.8 C), temperature source Oral, height 5\' 3"  (1.6 m), weight 234 lb (106.1 kg), SpO2 99 %. Body mass index is 41.45 kg/m.  General: Cooperative, alert, well developed, in no acute distress. HEENT: Conjunctivae and lids unremarkable. Cardiovascular: Regular rhythm.  Lungs: Normal work of breathing. Neurologic: No focal deficits.   Lab Results  Component Value Date   CREATININE 0.90 09/07/2018   BUN 16 09/07/2018   NA 145 09/07/2018   K 4.6 09/07/2018   CL 104 09/07/2018   CO2 30 09/07/2018   Lab Results  Component Value Date   ALT 35 09/07/2018   AST 23  09/07/2018   ALKPHOS 65 03/13/2018   BILITOT 0.6 03/13/2018   Lab Results  Component Value Date   HGBA1C 5.3 09/07/2018   HGBA1C 5.3 03/13/2018   HGBA1C 5.1 06/24/2016   HGBA1C 5.5 05/19/2015   Lab Results  Component Value Date   INSULIN 23.0 03/13/2018   Lab Results  Component Value Date   TSH 0.910 03/13/2018   Lab Results  Component Value Date   CHOL 257 (H) 09/07/2018   HDL 37.40 (L) 09/07/2018   LDLCALC 191 (H) 09/07/2018   TRIG 144.0 09/07/2018   CHOLHDL 7 09/07/2018   Lab Results  Component Value Date   WBC 8.3 03/13/2018   HGB 15.3 03/13/2018   HCT 45.8 03/13/2018   MCV 85 03/13/2018   PLT 372 06/24/2016   No results found for: IRON, TIBC, FERRITIN   Ref. Range 09/07/2018  09:46  VITD Latest Ref Range: 30.00 - 100.00 ng/mL 44.60    Attestation Statements:   Reviewed by clinician on day of visit: allergies, medications, problem list, medical history, surgical history, family history, social history, and previous encounter notes.  Corey Skains, am acting as Location manager for [employer], FNP-C.  I have reviewed the above documentation for accuracy and completeness, and I agree with the above. -  Sharonda Llamas [employer], FNP-C

## 2019-04-02 NOTE — Progress Notes (Signed)
The 10-year ASCVD risk score [name] DC Brooke Bonito., et al., 2013) is: 6%   Values used to calculate the score:     Age: 59 years     Sex: Female     Is Non-Hispanic African American: No     Diabetic: No     Tobacco smoker: No     Systolic Blood Pressure: [id number] mmHg     Is BP treated: Yes     HDL Cholesterol: 37.4 mg/dL     Total Cholesterol: 257 mg/dL

## 2019-04-03 LAB — COMPREHENSIVE METABOLIC PANEL
ALT: 33 IU/L — ABNORMAL HIGH (ref 0–32)
AST: 27 IU/L (ref 0–40)
Albumin/Globulin Ratio: 2 (ref 1.2–2.2)
Albumin: 4.7 g/dL (ref 3.8–4.9)
Alkaline Phosphatase: 70 IU/L (ref 39–117)
BUN/Creatinine Ratio: 12 (ref 9–23)
BUN: 10 mg/dL (ref 6–24)
Bilirubin Total: 0.4 mg/dL (ref 0.0–1.2)
CO2: 28 mmol/L (ref 20–29)
Calcium: 10.1 mg/dL (ref 8.7–10.2)
Chloride: 99 mmol/L (ref 96–106)
Creatinine, Ser: 0.82 mg/dL (ref 0.57–1.00)
GFR calc Af Amer: 91 mL/min/{1.73_m2} (ref >59)
GFR calc non Af Amer: 79 mL/min/{1.73_m2} (ref >59)
Globulin, Total: 2.3 g/dL (ref 1.5–4.5)
Glucose: 103 mg/dL — ABNORMAL HIGH (ref 65–99)
Potassium: 4.3 mmol/L (ref 3.5–5.2)
Sodium: 141 mmol/L (ref 134–144)
Total Protein: 7 g/dL (ref 6.0–8.5)

## 2019-04-03 LAB — LIPID PANEL WITH LDL/HDL RATIO
Cholesterol, Total: 234 mg/dL — ABNORMAL HIGH (ref 100–199)
HDL: 43 mg/dL (ref >39)
LDL Chol Calc (NIH): 168 mg/dL — ABNORMAL HIGH (ref 0–99)
LDL/HDL Ratio: 3.9 ratio — ABNORMAL HIGH (ref 0.0–3.2)
Triglycerides: 127 mg/dL (ref 0–149)
VLDL Cholesterol Cal: 23 mg/dL (ref 5–40)

## 2019-04-03 LAB — HEMOGLOBIN A1C
Est. average glucose Bld gHb Est-mCnc: 103 mg/dL
Hgb A1c MFr Bld: 5.2 % (ref 4.8–5.6)

## 2019-04-03 LAB — VITAMIN D 25 HYDROXY (VIT D DEFICIENCY, FRACTURES): Vit D, 25-Hydroxy: 30.7 ng/mL (ref 30.0–100.0)

## 2019-04-03 LAB — INSULIN, RANDOM: INSULIN: 25.3 u[IU]/mL — ABNORMAL HIGH (ref 2.6–24.9)

## 2019-04-04 ENCOUNTER — Encounter (INDEPENDENT_AMBULATORY_CARE_PROVIDER_SITE_OTHER): Payer: Self-pay | Admitting: Family Medicine

## 2019-04-04 ENCOUNTER — Encounter: Payer: Self-pay | Admitting: Family Medicine

## 2019-04-04 ENCOUNTER — Ambulatory Visit (INDEPENDENT_AMBULATORY_CARE_PROVIDER_SITE_OTHER): Payer: 59 | Admitting: Psychology

## 2019-04-04 DIAGNOSIS — F411 Generalized anxiety disorder: Secondary | ICD-10-CM | POA: Diagnosis not present

## 2019-04-04 DIAGNOSIS — F4310 Post-traumatic stress disorder, unspecified: Secondary | ICD-10-CM

## 2019-04-04 DIAGNOSIS — F331 Major depressive disorder, recurrent, moderate: Secondary | ICD-10-CM

## 2019-04-04 NOTE — Telephone Encounter (Signed)
FYI

## 2019-04-12 ENCOUNTER — Ambulatory Visit: Payer: 59 | Admitting: Psychology

## 2019-04-20 ENCOUNTER — Ambulatory Visit: Payer: 59 | Admitting: Psychology

## 2019-04-26 ENCOUNTER — Ambulatory Visit (INDEPENDENT_AMBULATORY_CARE_PROVIDER_SITE_OTHER): Payer: 59 | Admitting: Family Medicine

## 2019-05-01 ENCOUNTER — Encounter: Payer: Self-pay | Admitting: Family Medicine

## 2019-05-01 DIAGNOSIS — J3089 Other allergic rhinitis: Secondary | ICD-10-CM

## 2019-05-02 MED ORDER — BENZONATATE 100 MG PO CAPS: 100.0000 mg | ORAL_CAPSULE | Freq: Three times a day (TID) | ORAL | 1 refills | Status: DC | PRN

## 2019-05-04 ENCOUNTER — Ambulatory Visit (INDEPENDENT_AMBULATORY_CARE_PROVIDER_SITE_OTHER): Payer: 59 | Admitting: Psychology

## 2019-05-04 DIAGNOSIS — F410 Panic disorder [episodic paroxysmal anxiety] without agoraphobia: Secondary | ICD-10-CM

## 2019-05-04 DIAGNOSIS — F331 Major depressive disorder, recurrent, moderate: Secondary | ICD-10-CM

## 2019-05-04 DIAGNOSIS — F4310 Post-traumatic stress disorder, unspecified: Secondary | ICD-10-CM | POA: Diagnosis not present

## 2019-05-07 ENCOUNTER — Ambulatory Visit (INDEPENDENT_AMBULATORY_CARE_PROVIDER_SITE_OTHER): Payer: 59 | Admitting: Family Medicine

## 2019-05-08 ENCOUNTER — Ambulatory Visit (INDEPENDENT_AMBULATORY_CARE_PROVIDER_SITE_OTHER): Payer: 59 | Admitting: Family Medicine

## 2019-06-14 ENCOUNTER — Other Ambulatory Visit: Payer: Self-pay

## 2019-06-15 ENCOUNTER — Encounter: Payer: Self-pay | Admitting: Family Medicine

## 2019-06-15 ENCOUNTER — Ambulatory Visit (INDEPENDENT_AMBULATORY_CARE_PROVIDER_SITE_OTHER): Payer: 59 | Admitting: Family Medicine

## 2019-06-15 VITALS — BP 120/90 | HR 71 | Temp 97.3°F | Ht 64.0 in | Wt 248.0 lb

## 2019-06-15 DIAGNOSIS — Z0000 Encounter for general adult medical examination without abnormal findings: Secondary | ICD-10-CM | POA: Diagnosis not present

## 2019-06-15 DIAGNOSIS — E559 Vitamin D deficiency, unspecified: Secondary | ICD-10-CM | POA: Diagnosis not present

## 2019-06-15 DIAGNOSIS — F339 Major depressive disorder, recurrent, unspecified: Secondary | ICD-10-CM

## 2019-06-15 DIAGNOSIS — E7849 Other hyperlipidemia: Secondary | ICD-10-CM

## 2019-06-15 DIAGNOSIS — I10 Essential (primary) hypertension: Secondary | ICD-10-CM

## 2019-06-15 DIAGNOSIS — Z6841 Body Mass Index (BMI) 40.0 and over, adult: Secondary | ICD-10-CM | POA: Diagnosis not present

## 2019-06-15 DIAGNOSIS — J3089 Other allergic rhinitis: Secondary | ICD-10-CM

## 2019-06-15 LAB — VITAMIN D 25 HYDROXY (VIT D DEFICIENCY, FRACTURES): VITD: 27.78 ng/mL — ABNORMAL LOW (ref 30.00–100.00)

## 2019-06-15 LAB — BASIC METABOLIC PANEL
BUN: 15 mg/dL (ref 6–23)
CO2: 31 mEq/L (ref 19–32)
Calcium: 9.5 mg/dL (ref 8.4–10.5)
Chloride: 102 mEq/L (ref 96–112)
Creatinine, Ser: 0.79 mg/dL (ref 0.40–1.20)
GFR: 74.68 mL/min (ref >60.00)
Glucose, Bld: 103 mg/dL — ABNORMAL HIGH (ref 70–99)
Potassium: 4.4 mEq/L (ref 3.5–5.1)
Sodium: 139 mEq/L (ref 135–145)

## 2019-06-15 LAB — AST: AST: 23 U/L (ref 0–37)

## 2019-06-15 LAB — LIPID PANEL
Cholesterol: 248 mg/dL — ABNORMAL HIGH (ref 0–200)
HDL: 40.2 mg/dL (ref >39.00)
LDL Cholesterol: 181 mg/dL — ABNORMAL HIGH (ref 0–99)
NonHDL: 207.61
Total CHOL/HDL Ratio: 6
Triglycerides: 134 mg/dL (ref 0.0–149.0)
VLDL: 26.8 mg/dL (ref 0.0–40.0)

## 2019-06-15 LAB — ALT: ALT: 27 U/L (ref 0–35)

## 2019-06-15 MED ORDER — BENZONATATE 100 MG PO CAPS: 100.0000 mg | ORAL_CAPSULE | Freq: Three times a day (TID) | ORAL | 1 refills | Status: DC | PRN

## 2019-06-15 NOTE — Progress Notes (Signed)
Alejandra Miller is a 59 y.o. female  Chief Complaint  Patient presents with  . Annual Exam    Pt is fasting for labs today.  Pt also has a form to be filled out.    HPI: Alejandra Miller is a 59 y.o. female here for annual CPE, fasting labs.   Last PAP: follows with GYN Dr. [name] - has appt in 11/2019 Last mammo: 03/2019 Last colonoscopy: 09/2013 w/ Dr. Carlean Purl [facility] - due in 2025 Last Dexa: 04/2015  Dental: UTD - has appt 06/2019 Vision: UTD  Med refills needed today? Tessalon capsules   Past Medical History:  Diagnosis Date  . Anxiety    NO MEDS  . Arthritis   . Back pain   . Cervical cancer screening 07/20/2012   Menarche at 13, regular til recently No h/o abnormal paps, last pap 2-3 years ago G0P0  MGM today, no h/o abnormal MGMs Spotting now monthly and scant H/o uterine polyps  . Condyloma acuminata 2020  . Depression    counseling in 2010 for 2 months on medication  . Fibrocystic breast 1988  . Fibroids 2014  . Fracture of right fibula 05/19/2015   October 2016  . GERD (gastroesophageal reflux disease) 2005   treated with omeprazole  . High cholesterol 2002  . History of sinusitis    once/twice per year  . Hyperglycemia 05/19/2015  . Hypertension 2002  . Joint pain   . MRSA (methicillin resistant staph aureus) culture positive 2007  . Muscular pain   . Obesity   . OSA (obstructive sleep apnea)    +sleep study 08/2010., DOES NOT USE CPAP, USES MOSES MOUTH PIECE  . Osteoarthritis    KNEES, HANDS, HIPS  . Osteoporosis   . Plantar fasciitis of left foot 03/2014  . Seasonal allergies    affected in the spring  . Sleep apnea    mouthpiece "[place]"  . Uterine polyp   . 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 Vitamin D deficiency   . Wrist fracture 07/2012   right    Past Surgical History:  Procedure Laterality Date  . DILATATION & CURETTAGE/HYSTEROSCOPY WITH MYOSURE N/A 07/30/2014   Procedure: DILATATION & CURETTAGE/HYSTEROSCOPY  WITH MYOSURE;  Surgeon: Nunzio Cobbs, MD;  Location: [facility];  Service: Gynecology;  Laterality: N/A;  extra time for BMI 49.  Marland Kitchen DILATATION & CURRETTAGE/HYSTEROSCOPY WITH RESECTOCOPE N/A 11/24/2012   Procedure: DILATATION & CURETTAGE/HYSTEROSCOPY WITH RESECTOCOPE;  Surgeon: Arloa Koh, MD;  Location: [facility];  Service: Gynecology;  Laterality: N/A;  . ESSURE TUBAL LIGATION Left   . TONSILLECTOMY  1969  . WISDOM TOOTH EXTRACTION  1979  . WRIST FRACTURE SURGERY Right 07/2012    Social History   Socioeconomic History  . Marital status: Married    Spouse name: Dayanna Inghram  . Number of children: Not on file  . Years of education: Not on file  . Highest education level: Not on file  Occupational History  . Not on file  Tobacco Use  . Smoking status: Former Smoker    Packs/day: 0.50    Years: 6.00    Pack years: 3.00    Types: Cigarettes    Quit date: 03/01/1989    Years since quitting: 30.3  . Smokeless tobacco: Never Used  . Tobacco comment: Quit in 1991- started in 1983 1/2ppd  Substance and Sexual Activity  . Alcohol use: Yes    Alcohol/week: 0.0  standard drinks    Comment: Occasional  . Drug use: No  . Sexual activity: Yes    Partners: Male    Birth control/protection: Other-see comments, I.U.D.    Comment: vasectomy/Mirena IUD inserted 03-04-16  Other Topics Concern  . Not on file  Social History Narrative  . Not on file   Social Determinants of Health   Financial Resource Strain:   . Difficulty of Paying Living Expenses:   Food Insecurity:   . Worried About Charity fundraiser in the Last Year:   . Arboriculturist in the Last Year:   Transportation Needs:   . Film/video editor (Medical):   Marland Kitchen Lack of Transportation (Non-Medical):   Physical Activity:   . Days of Exercise per Week:   . Minutes of Exercise per Session:   Stress:   . Feeling of Stress :   Social Connections:   . Frequency of Communication with Friends and Family:   . Frequency of  Social Gatherings with Friends and Family:   . Attends Religious Services:   . Active Member of Clubs or Organizations:   . Attends Archivist Meetings:   Marland Kitchen Marital Status:   Intimate Partner Violence:   . Fear of Current or Ex-Partner:   . Emotionally Abused:   Marland Kitchen Physically Abused:   . Sexually Abused:     Family History  Problem Relation Age of Onset  . Osteoarthritis Mother   . Hyperlipidemia Mother   . Depression Mother   . Alzheimer's disease Mother   . Hypertension Mother   . Sleep apnea Mother   . Obesity Mother   . Liver cancer Maternal Grandmother        mets to breast  . Breast cancer Maternal Grandmother        liver mets to breast  . Cancer Maternal Grandmother        with mets  . Hyperlipidemia Father   . Coronary artery disease Father   . Hypertension Father   . Stroke Father        multiple  . Diabetes Father   . Obesity Father   . Sleep apnea Father   . Colon cancer Neg Hx      Immunization History  Administered Date(s) Administered  . Influenza Split 12/06/2011  . Influenza Whole 01/02/2013  . Influenza,inj,Quad PF,6+ Mos 11/02/2013, 11/23/2016, 12/10/2017, 10/26/2018  . Influenza-Unspecified 12/04/2014, 03/03/2016, 11/23/2016, 12/10/2017  . Pneumococcal Conjugate-13 01/22/2013  . Tdap 03/05/2002, 08/02/2012, 01/22/2013    Outpatient Encounter Medications as of 06/15/2019  Medication Sig  . busPIRone (BUSPAR) 7.5 MG tablet Take 1 tablet (7.5 mg total) by mouth 2 (two) times daily.  Marland Kitchen CALCIUM PO Take 1 tablet by mouth 2 (two) times daily.   . Coenzyme Q10 (CO Q 10 PO) Take by mouth daily.  . cyclobenzaprine (FLEXERIL) 10 MG tablet TAKE 1 TABLET BY MOUTH EVERY DAY AS NEEDED  . levonorgestrel (MIRENA) 20 MCG/24HR IUD 1 each by Intrauterine route once.  Marland Kitchen lisinopril-hydrochlorothiazide (ZESTORETIC) 20-25 MG tablet Take 1 tablet by mouth daily.  Marland Kitchen MAGNESIUM PO Take 500 mg by mouth.  . Melatonin 3 MG TABS Take 1 tablet by mouth at bedtime.    . montelukast (SINGULAIR) 10 MG tablet Take 1 tablet (10 mg total) by mouth at bedtime.  . Multiple Vitamin (MULTIVITAMIN) tablet Take 1 tablet by mouth daily.    . mupirocin cream (BACTROBAN) 2 % Apply 1 application topically as needed.  . Omega-3 Fatty Acids (  FISH OIL) 1000 MG CAPS Take 1,000 mg by mouth daily.  Marland Kitchen omeprazole (PRILOSEC) 20 MG capsule Take 20 mg by mouth daily as needed.  Marland Kitchen POTASSIUM PO Take 99 mg by mouth.  . Probiotic Product (PROBIOTIC PO) Take by mouth.  . triamcinolone (NASACORT ALLERGY 24HR) 55 MCG/ACT AERO nasal inhaler Place 2 sprays into the nose daily.  . TURMERIC PO Take by mouth.  . Vilazodone HCl (VIIBRYD) 40 MG TABS Take 1 tablet (40 mg total) by mouth daily.  . Vitamin D, Ergocalciferol, (DRISDOL) 1.25 MG (50000 UT) CAPS capsule Take 1 capsule (50,000 Units total) by mouth every 7 (seven) days.  . [DISCONTINUED] benzonatate (TESSALON) 100 MG capsule Take 1 capsule (100 mg total) by mouth 3 (three) times daily as needed for cough.  . benzonatate (TESSALON) 100 MG capsule Take 1 capsule (100 mg total) by mouth 3 (three) times daily as needed for cough.  Marland Kitchen ibuprofen (ADVIL) 800 MG tablet Take 800 mg by mouth 3 (three) times daily.  . [DISCONTINUED] guaiFENesin-codeine (VIRTUSSIN A/C) 100-10 MG/5ML syrup Take 5 mLs by mouth 3 (three) times daily as needed for cough. (Patient not taking: Reported on 06/15/2019)   No facility-administered encounter medications on file as of 06/15/2019.     ROS: Gen: no fever, chills  Skin: no rash, itching ENT: no ear pain, ear drainage, nasal congestion, rhinorrhea, sinus pressure, sore throat Eyes: no blurry vision, double vision Resp: no cough, wheeze,SOB CV: no CP, palpitations, LE edema,  GI: no heartburn, n/v/d/c, abd pain GU: no dysuria, urgency, frequency, hematuria MSK: no joint pain, myalgias; + Lt upper back pain x 1 week (pt has been packing for move) Neuro: no dizziness, headache, weakness, vertigo Psych: +  depression   Allergies  Allergen Reactions  . Bupropion Other (See Comments)    Makes her feel devoid of emotions    BP 120/90 (BP Location: Left Arm, Patient Position: Sitting, Cuff Size: Large)   Pulse 71   Temp (!) 97.3 F (36.3 C) (Temporal)   Ht 5\' 4"  (1.626 m)   Wt 248 lb (112.5 kg)   SpO2 97%   BMI 42.57 kg/m    BP Readings from Last 3 Encounters:  06/15/19 120/90  04/02/19 127/83  02/15/19 122/79   Pulse Readings from Last 3 Encounters:  06/15/19 71  04/02/19 83  02/15/19 85   Wt Readings from Last 3 Encounters:  06/15/19 248 lb (112.5 kg)  04/02/19 234 lb (106.1 kg)  02/15/19 238 lb (108 kg)     Physical Exam  Constitutional: She is oriented to person, place, and time. She appears well-developed and well-nourished. No distress.  HENT:  Head: Normocephalic and atraumatic.  Right Ear: Tympanic membrane and ear canal normal.  Left Ear: Tympanic membrane and ear canal normal.  Nose: Nose normal.  Mouth/Throat: Oropharynx is clear and moist and mucous membranes are normal.  Eyes: Pupils are equal, round, and reactive to light. Conjunctivae are normal.  Neck: No thyromegaly present.  Cardiovascular: Normal rate, regular rhythm, normal heart sounds and intact distal pulses.  No murmur heard. Pulmonary/Chest: Effort normal and breath sounds normal. No respiratory distress. She has no wheezes. She has no rhonchi.  Abdominal: Soft. Bowel sounds are normal. She exhibits no distension and no mass. There is no abdominal tenderness.  Musculoskeletal:        General: No edema.     Cervical back: Neck supple.  Lymphadenopathy:    She has no cervical adenopathy.  Neurological: She is  alert and oriented to person, place, and time. She exhibits normal muscle tone. Coordination normal.  Skin: Skin is warm and dry.  Psychiatric: She has a normal mood and affect. Her behavior is normal.   Depression screen [facility] 2/9 01/19/2019 12/13/2018 10/26/2018  Decreased Interest 2 0  1  Down, Depressed, Hopeless 2 0 1  PHQ - 2 Score 4 0 2  Altered sleeping 2 0 0  Tired, decreased energy 2 1 1   Change in appetite 2 0 0  Feeling bad or failure about yourself  2 1 0  Trouble concentrating 2 0 0  Moving slowly or fidgety/restless 0 - 0  Suicidal thoughts 0 0 0  PHQ-9 Score 14 2 3   Difficult doing work/chores Somewhat difficult - -   GAD 7 : Generalized Anxiety Score 01/19/2019 10/26/2018  Nervous, Anxious, on Edge 2 1  Control/stop worrying 2 0  Worry too much - different things 2 1  Trouble relaxing 2 1  Restless 0 0  Easily annoyed or irritable 3 3  Afraid - awful might happen 2 0  Total GAD 7 Score 13 6  Anxiety Difficulty Somewhat difficult -       A/P:  1. Annual physical exam - PAP, mammo, colonoscopy UTD - dental and vision exams UTD - discussed importance of regular CV exercise, healthy diet, adequate sleep - ALT - AST - Basic metabolic panel - VITAMIN D 25 Hydroxy (Vit-D Deficiency, Fractures) - Lipid panel - next CPE in 1 year  2. Vitamin D deficiency - VITAMIN D 25 Hydroxy (Vit-D Deficiency, Fractures)  3. Other hyperlipidemia - on fish oil - focus on low fat diet and regular CV exercise - ALT - AST - Lipid panel  4. Depression, recurrent ([place]) - stable, overall controlled  - cont viibryd  - pt found [facility] counseling to be helpful, improved her symptoms. She is taking a break at this time d/t financial reasons  5. Essential hypertension - controlled, at goal - cont with low sodium diet, regular exercise - Basic metabolic panel - cont current med  6. BMI 40.0-44.9, adult ([place]) - discussed importance of regular CV exercise, healthy diet - ALT - AST - Lipid panel  7. Non-seasonal allergic rhinitis, unspecified trigger - cont with nasal saline spray, anti-histamine, nasal steroid spray, singulair Refill: - benzonatate (TESSALON) 100 MG capsule; Take 1 capsule (100 mg total) by mouth 3 (three) times daily as needed for cough.   Dispense: 30 capsule; Refill: 1    This visit occurred during the SARS-CoV-2 public health emergency.  Safety protocols were in place, including screening questions prior to the visit, additional usage of staff PPE, and extensive cleaning of exam room while observing appropriate contact time as indicated for disinfecting solutions.

## 2019-06-20 ENCOUNTER — Encounter: Payer: Self-pay | Admitting: Family Medicine

## 2019-06-20 MED ORDER — METHYLPREDNISOLONE 4 MG PO TBPK: ORAL_TABLET | ORAL | 0 refills | Status: DC

## 2019-07-02 ENCOUNTER — Encounter: Payer: Self-pay | Admitting: Family Medicine

## 2019-07-02 DIAGNOSIS — J3089 Other allergic rhinitis: Secondary | ICD-10-CM

## 2019-07-03 MED ORDER — BENZONATATE 100 MG PO CAPS: 100.0000 mg | ORAL_CAPSULE | Freq: Three times a day (TID) | ORAL | 1 refills | Status: DC | PRN

## 2019-07-27 LAB — HM DEXA SCAN: HM Dexa Scan: NORMAL

## 2019-08-03 ENCOUNTER — Encounter: Payer: Self-pay | Admitting: Nurse Practitioner

## 2019-08-03 ENCOUNTER — Other Ambulatory Visit: Payer: Self-pay

## 2019-08-03 ENCOUNTER — Telehealth (INDEPENDENT_AMBULATORY_CARE_PROVIDER_SITE_OTHER): Payer: 59 | Admitting: Nurse Practitioner

## 2019-08-03 VITALS — Ht 64.0 in | Wt 230.0 lb

## 2019-08-03 DIAGNOSIS — J0141 Acute recurrent pansinusitis: Secondary | ICD-10-CM | POA: Diagnosis not present

## 2019-08-03 MED ORDER — MUCINEX 600 MG PO TB12: 600.0000 mg | ORAL_TABLET | Freq: Two times a day (BID) | ORAL | 0 refills | Status: DC | PRN

## 2019-08-03 MED ORDER — AMOXICILLIN 875 MG PO TABS: 875.0000 mg | ORAL_TABLET | Freq: Two times a day (BID) | ORAL | 0 refills | Status: DC

## 2019-08-03 MED ORDER — SALINE SPRAY 0.65 % NA SOLN: 1.0000 | NASAL | 0 refills | Status: AC | PRN

## 2019-08-03 NOTE — Progress Notes (Signed)
Virtual Visit via Video Note  I connected with@ on 08/03/19 at 10:30 AM EDT by a video enabled telemedicine application and verified that I am speaking with the correct person using two identifiers.  Location: Patient:Home Provider: Office Participants: patient and provider   I connected with Patient Name today by a video enabled telemedicine application and verified that I am speaking with the correct person using two identifiers. Location patient: home Location provider: work Persons participating in the virtual visit: patient, provider  CC:Pt states shes had some coughing and the left side under eyes eye is puffy since Saturday//Pt states she does cough up clear to yellowish mucus//pt tried allegra and mucinex but doesn't seem to help//  History of Present Illness: Sinusitis This is a recurrent problem. The current episode started more than 1 month ago. The problem has been waxing and waning since onset. There has been no fever. Associated symptoms include congestion, coughing, headaches and sinus pressure. Pertinent negatives include no chills, diaphoresis, ear pain, hoarse voice, neck pain, shortness of breath, sneezing, sore throat or swollen glands. Treatments tried: oral prednisone, antihistamine and mucinex. The treatment provided mild relief.   Observations/Objective: Physical Exam  Constitutional: She is oriented to person, place, and time. No distress.  Cardiovascular: Normal rate.  Pulmonary/Chest: Effort normal.  Neurological: She is alert and oriented to person, place, and time.   Assessment and Plan: Alejandra Miller was seen today for sinusitis.  Diagnoses and all orders for this visit:  Acute non-recurrent pansinusitis -     amoxicillin (AMOXIL) 875 MG tablet; Take 1 tablet (875 mg total) by mouth 2 (two) times daily. -     sodium chloride (OCEAN) 0.65 % SOLN nasal spray; Place 1 spray into both nostrils as needed for congestion. -     guaiFENesin (MUCINEX) 600 MG 12 hr  tablet; Take 1 tablet (600 mg total) by mouth 2 (two) times daily as needed for cough or to loosen phlegm.   Follow Up Instructions: Continue nasocort ans saline nasal spray Add above medications,   I discussed the assessment and treatment plan with the patient. The patient was provided an opportunity to ask questions and all were answered. The patient agreed with the plan and demonstrated an understanding of the instructions.   The patient was advised to call back or seek an in-person evaluation if the symptoms worsen or if the condition fails to improve as anticipated.   Wilfred Lacy, NP

## 2019-08-08 ENCOUNTER — Encounter: Payer: Self-pay | Admitting: Family Medicine

## 2019-08-08 MED ORDER — IBUPROFEN 800 MG PO TABS: 800.0000 mg | ORAL_TABLET | Freq: Three times a day (TID) | ORAL | 1 refills | Status: DC

## 2019-08-08 MED ORDER — CYCLOBENZAPRINE HCL 10 MG PO TABS: 10.0000 mg | ORAL_TABLET | Freq: Every day | ORAL | 1 refills | Status: DC | PRN

## 2019-08-08 NOTE — Telephone Encounter (Signed)
Last VV 08/03/19 Last fill for Ibuprofen 04/10/19 Historical provider Last fill for Flexeril 11/27/18  #90/1

## 2019-08-14 ENCOUNTER — Encounter: Payer: Self-pay | Admitting: Nurse Practitioner

## 2019-08-14 ENCOUNTER — Other Ambulatory Visit: Payer: Self-pay

## 2019-08-14 ENCOUNTER — Telehealth (INDEPENDENT_AMBULATORY_CARE_PROVIDER_SITE_OTHER): Payer: 59 | Admitting: Nurse Practitioner

## 2019-08-14 VITALS — Wt 230.0 lb

## 2019-08-14 DIAGNOSIS — J0141 Acute recurrent pansinusitis: Secondary | ICD-10-CM | POA: Diagnosis not present

## 2019-08-14 MED ORDER — DOXYCYCLINE HYCLATE 100 MG PO TABS: 100.0000 mg | ORAL_TABLET | Freq: Two times a day (BID) | ORAL | 0 refills | Status: AC

## 2019-08-14 NOTE — Patient Instructions (Signed)
Go to hospital if symptoms worsen.

## 2019-08-14 NOTE — Progress Notes (Signed)
Virtual Visit via Video Note  I connected with@ on 08/14/19 at 12:30 PM EDT by a video enabled telemedicine application and verified that I am speaking with the correct person using two identifiers.  Location: Patient:Home Provider: Office Participants: patient and provider  I discussed the limitations of evaluation and management by telemedicine and the availability of in person appointments. I also discussed with the patient that there may be a patient responsible charge related to this service. The patient expressed understanding and agreed to proceed.  CC:pt stated that she finished the Amoxicillin Thursday that was given 08/03/19//Friday, Saturday, Sun she felt runned downed and yesterday she said she had bodyaches, chills, hip hurt today shes feels sinus congestion is worse//had a fever of 99.8 forehead and 100.3 anxillary/  History of Present Illness: Alejandra Miller reports nasal congestion, headache, chills, fever, and non productive cough after completion of oral abx 3days prior. Symptoms had improved while taking amoxicillin 875mg . She denies any recent travel, she works from her home. No chest pain or SOB.  Observations/Objective: Physical Exam Constitutional:      General: She is not in acute distress.    Appearance: She is not toxic-appearing.  Pulmonary:     Effort: Pulmonary effort is normal.  Neurological:     Mental Status: She is alert and oriented to person, place, and time.    Assessment and Plan: Alejandra Miller was seen today for sinusitis.  Diagnoses and all orders for this visit:  Acute recurrent pansinusitis -     doxycycline (VIBRA-TABS) 100 MG tablet; Take 1 tablet (100 mg total) by mouth 2 (two) times daily for 7 days.   Follow Up Instructions: Start doxycycline Maintain adequate oral hydration Use saline nasal rinse prn   I discussed the assessment and treatment plan with the patient. The patient was provided an opportunity to ask questions and all were answered.  The patient agreed with the plan and demonstrated an understanding of the instructions.   The patient was advised to call back or seek an in-person evaluation if the symptoms worsen or if the condition fails to improve as anticipated.  Wilfred Lacy, NP

## 2019-10-12 ENCOUNTER — Other Ambulatory Visit: Payer: Self-pay | Admitting: Family Medicine

## 2019-10-12 NOTE — Telephone Encounter (Signed)
Please review and advise if appropriate to refill

## 2019-10-31 ENCOUNTER — Encounter: Payer: Self-pay | Admitting: Family Medicine

## 2019-10-31 DIAGNOSIS — F419 Anxiety disorder, unspecified: Secondary | ICD-10-CM

## 2019-10-31 DIAGNOSIS — J3089 Other allergic rhinitis: Secondary | ICD-10-CM

## 2019-10-31 DIAGNOSIS — F339 Major depressive disorder, recurrent, unspecified: Secondary | ICD-10-CM

## 2019-10-31 DIAGNOSIS — I10 Essential (primary) hypertension: Secondary | ICD-10-CM

## 2019-11-01 MED ORDER — MONTELUKAST SODIUM 10 MG PO TABS: 10.0000 mg | ORAL_TABLET | Freq: Every day | ORAL | 3 refills | Status: DC

## 2019-11-01 MED ORDER — LISINOPRIL-HYDROCHLOROTHIAZIDE 20-25 MG PO TABS: 1.0000 | ORAL_TABLET | Freq: Every day | ORAL | 3 refills | Status: DC

## 2019-11-01 MED ORDER — CYCLOBENZAPRINE HCL 10 MG PO TABS: 10.0000 mg | ORAL_TABLET | Freq: Every day | ORAL | 3 refills | Status: DC | PRN

## 2019-11-01 MED ORDER — BUSPIRONE HCL 7.5 MG PO TABS: 7.5000 mg | ORAL_TABLET | Freq: Two times a day (BID) | ORAL | 3 refills | Status: DC

## 2019-11-01 MED ORDER — VIIBRYD 40 MG PO TABS: 40.0000 mg | ORAL_TABLET | Freq: Every day | ORAL | 3 refills | Status: DC

## 2019-11-01 MED ORDER — IBUPROFEN 800 MG PO TABS: 800.0000 mg | ORAL_TABLET | Freq: Three times a day (TID) | ORAL | 0 refills | Status: DC

## 2019-12-18 NOTE — Progress Notes (Signed)
59 y.o. G0P0 Married Caucasian female here for annual exam.    Patient feels like she doesn't completely empty bladder. This occurs most every day. She'll empty and has to go back and only does a dribble. Denies any burning, dysuria or fever. Symptoms for months but increasing recently. Feels the urge to void again when she is washing her hands. 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  DF - every 2 - 3 hours. NF - once a night. Denies pain, discomfort with urination or blood in her urine.  No hx glaucoma.   Coffee in the am - 3 cups.  Water with Crystal Light or diet citrus green tea.   No vaginal bleeding.   Working mostly from home.  Stress due to decreased staff.  Vaccinated against Covid.  Plans on doing a flu vaccine.   Urine dip - trace blood.   PCP: Letta Median, DO   No LMP recorded. (Menstrual status: IUD).           Sexually active: No.  The current method of family planning is vasectomy/Mirena IUD 03-04-16.    Exercising: No.  The patient does not participate in regular exercise at present. Smoker:  no  Health Maintenance: Pap: 12-03-16 Neg:Neg HR HPV, 10-12-13 Neg:neg HR HPV.  Pap 07/20/12 - normal.  No HR HPV done.  History of abnormal Pap:  no MMG: 03-05-19 3D/neg/density B/BiRads1 Colonoscopy:  2015 normal;next 2025 BMD:  07-31-19  Result :Normal, Hx osteoporosis--no significant change. Repeat BMD in 2 years--[facility]: 2014 Gardasil:   no HIV: 2006 Neg Hep C: 05-19-15 Neg Screening Labs:  PCP.    reports that she quit smoking about 30 years ago. Her smoking use included cigarettes. She has a 3.00 pack-year smoking history. She has never used smokeless tobacco. She reports current alcohol use. She reports that she does not use drugs.  Past Medical History:  Diagnosis Date   Anxiety    NO MEDS   Arthritis    Back pain    Cervical cancer screening 07/20/2012   Menarche at 13, regular til recently No h/o abnormal paps, last pap 2-3 years ago G0P0  MGM today, no  h/o abnormal MGMs Spotting now monthly and scant H/o uterine polyps   Condyloma acuminata 2020   Depression    counseling in 2010 for 2 months on medication   Fibrocystic breast 1988   Fibroids 2014   Fracture of right fibula 05/19/2015   October 2016   GERD (gastroesophageal reflux disease) 2005   treated with omeprazole   High cholesterol 2002   History of sinusitis    once/twice per year   Hyperglycemia 05/19/2015   Hypertension 2002   Joint pain    MRSA (methicillin resistant staph aureus) culture positive 2007   Muscular pain    Obesity    OSA (obstructive sleep apnea)    +sleep study 08/2010., DOES NOT USE CPAP, USES MOSES MOUTH PIECE   Osteoarthritis    KNEES, HANDS, HIPS   Osteoporosis    Plantar fasciitis of left foot 03/2014   Seasonal allergies    affected in the spring   Sleep apnea    mouthpiece "[facility]"   Uterine polyp    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   DILATATION & CURRETTAGE/HYSTEROSCOPY WITH RESECTOCOPE N/A 11/24/2012   Procedure: DILATATION & CURETTAGE/HYSTEROSCOPY WITH RESECTOCOPE;  Surgeon: Arloa Koh, MD;  Location: [facility];  Service: Gynecology;  Laterality: N/A;   ESSURE TUBAL LIGATION Left    TONSILLECTOMY  1969   WISDOM TOOTH EXTRACTION  1979   WRIST FRACTURE SURGERY Right 07/2012    Current Outpatient Medications  Medication Sig Dispense Refill   busPIRone (BUSPAR) 7.5 MG tablet Take 1 tablet (7.5 mg total) by mouth 2 (two) times daily. 180 tablet 3   CALCIUM PO Take 1 tablet by mouth 2 (two) times daily.      Coenzyme Q10  (CO Q 10 PO) Take by mouth daily.     cyclobenzaprine (FLEXERIL) 10 MG tablet Take 1 tablet (10 mg total) by mouth daily as needed. 90 tablet 3   guaiFENesin (MUCINEX) 600 MG 12 hr tablet Take 1 tablet (600 mg total) by mouth 2 (two) times daily as needed for cough or to loosen phlegm. 14 tablet 0   ibuprofen (ADVIL) 800 MG tablet Take 1 tablet (800 mg total) by mouth 3 (three) times daily. 60 tablet 0   levonorgestrel (MIRENA) 20 MCG/24HR IUD 1 each by Intrauterine route once.     lisinopril-hydrochlorothiazide (ZESTORETIC) 20-25 MG tablet Take 1 tablet by mouth daily. 90 tablet 3   MAGNESIUM PO Take 500 mg by mouth.     Melatonin 3 MG TABS Take 1 tablet by mouth at bedtime.     montelukast (SINGULAIR) 10 MG tablet Take 1 tablet (10 mg total) by mouth at bedtime. 90 tablet 3   Multiple Vitamin (MULTIVITAMIN) tablet Take 1 tablet by mouth daily.       mupirocin cream (BACTROBAN) 2 % Apply 1 application topically as needed. 15 g 1   Omega-3 Fatty Acids (FISH OIL) 1000 MG CAPS Take 1,000 mg by mouth daily.     omeprazole (PRILOSEC) 20 MG capsule Take 20 mg by mouth daily as needed.     POTASSIUM PO Take 99 mg by mouth.     Probiotic Product (PROBIOTIC PO) Take by mouth.     sodium chloride (OCEAN) 0.65 % SOLN nasal spray Place 1 spray into both nostrils as needed for congestion. 15 mL 0   triamcinolone (NASACORT ALLERGY 24HR) 55 MCG/ACT AERO nasal inhaler Place 2 sprays into the nose daily.     TURMERIC PO Take by mouth.     Vilazodone HCl (VIIBRYD) 40 MG TABS Take 1 tablet (40 mg total) by mouth daily. 90 tablet 3   Vitamin D, Ergocalciferol, (DRISDOL) 1.25 MG (50000 UT) CAPS capsule Take 1 capsule (50,000 Units total) by mouth every 7 (seven) days. 12 capsule 3   oxybutynin (DITROPAN XL) 10 MG 24 hr tablet Take 1 tablet (10 mg total) by mouth at bedtime. 30 tablet 1   No current facility-administered medications for this visit.    Family History  Problem Relation Age of  Onset   Osteoarthritis Mother    Hyperlipidemia Mother    Depression Mother    Alzheimer's disease Mother    Hypertension Mother    Sleep apnea Mother    Obesity Mother    Liver cancer Maternal Grandmother        mets to breast   Breast cancer Maternal Grandmother        liver mets  to breast   Cancer Maternal Grandmother        with mets   Hyperlipidemia Father    Coronary artery disease Father    Hypertension Father    Stroke Father        multiple   Diabetes Father    Obesity Father    Sleep apnea Father    Colon cancer Neg Hx     Review of Systems  Genitourinary:       Not completely emptying bladder.  All other systems reviewed and are negative.   Exam:   BP 132/88 (Cuff Size: Large)    Pulse 67    Ht 5' 3.5" (1.613 m)    Wt 248 lb (112.5 kg)    SpO2 99%    BMI 43.24 kg/m     General appearance: alert, cooperative and appears stated age Head: normocephalic, without obvious abnormality, atraumatic Neck: no adenopathy, supple, symmetrical, trachea midline and thyroid normal to inspection and palpation Lungs: clear to auscultation bilaterally Breasts: normal appearance, no masses or tenderness, No nipple retraction or dimpling, No nipple discharge or bleeding, No axillary adenopathy Heart: regular rate and rhythm Abdomen: soft, non-tender; no masses, no organomegaly Extremities: extremities normal, atraumatic, no cyanosis or edema Skin: skin color, texture, turgor normal. No rashes or lesions Lymph nodes: cervical, supraclavicular, and axillary nodes normal. Neurologic: grossly normal  Pelvic: External genitalia:  no lesions              No abnormal inguinal nodes palpated.              Urethra:  normal appearing urethra with no masses, tenderness or lesions              Bartholins and Skenes: normal                 Vagina: normal appearing vagina with normal color and discharge, no lesions              Cervix: no lesions.  IUD strings noted.                Pap taken: No. Bimanual Exam:  Uterus:  normal size, contour, position, consistency, mobility, non-tender              Adnexa: no mass, fullness, tenderness              Rectal exam: Yes.  .  Confirms.              Anus:  normal sphincter tone, no lesions  Bladder catheterization.  Verbal consent for check of PVR.  Sterile prep with betadine.  7 cc PVR.  No complications.  Minimal EBL.   Chaperone was present for exam.  Assessment:   Well woman visit with normal exam. Mirena IUD.Perimenopausal female.   Has unilateral Essure. Overactive bladder.  HTN.  Anxiety.  Osteoporosis. OffForteo. Status post Reclast. Followed by osteoporosis clinic at [facility]. BMI 43. Hx MRSA.  Plan: Mammogram screening discussed. Self breast awareness reviewed. 7 year use of Mirena IUD reviewed.   Pap and HR HPV 2023. Guidelines for Calcium, Vitamin D, regular exercise program including cardiovascular and weight bearing exercise. We discussed overactive bladder.  Bladder irritants reviewed.  Start Ditropan XL 10 mg daily.  Will do urine dip.  Follow up in 6 weeks and annually.

## 2019-12-19 ENCOUNTER — Other Ambulatory Visit: Payer: Self-pay

## 2019-12-19 ENCOUNTER — Encounter: Payer: Self-pay | Admitting: Obstetrics and Gynecology

## 2019-12-19 ENCOUNTER — Ambulatory Visit (INDEPENDENT_AMBULATORY_CARE_PROVIDER_SITE_OTHER): Payer: 59 | Admitting: Obstetrics and Gynecology

## 2019-12-19 VITALS — BP 132/88 | HR 67 | Ht 63.5 in | Wt 248.0 lb

## 2019-12-19 DIAGNOSIS — N3281 Overactive bladder: Secondary | ICD-10-CM

## 2019-12-19 DIAGNOSIS — Z01419 Encounter for gynecological examination (general) (routine) without abnormal findings: Secondary | ICD-10-CM

## 2019-12-19 LAB — POCT URINALYSIS DIPSTICK
Bilirubin, UA: NEGATIVE
Glucose, UA: NEGATIVE
Ketones, UA: NEGATIVE
Leukocytes, UA: NEGATIVE
Nitrite, UA: NEGATIVE
Protein, UA: NEGATIVE
Urobilinogen, UA: 0.2 E.U./dL
pH, UA: 5 (ref 5.0–8.0)

## 2019-12-19 MED ORDER — OXYBUTYNIN CHLORIDE ER 10 MG PO TB24: 10.0000 mg | ORAL_TABLET | Freq: Every day | ORAL | 1 refills | Status: DC

## 2019-12-19 NOTE — Patient Instructions (Addendum)
EXERCISE AND DIET:  We recommended that you start or continue a regular exercise program for good health. Regular exercise means any activity that makes your heart beat faster and makes you sweat.  We recommend exercising at least 30 minutes per day at least 3 days a week, preferably 4 or 5.  We also recommend a diet low in fat and sugar.  Inactivity, poor dietary choices and obesity can cause diabetes, heart attack, stroke, and kidney damage, among others.    ALCOHOL AND SMOKING:  Women should limit their alcohol intake to no more than 7 drinks/beers/glasses of wine (combined, not each!) per week. Moderation of alcohol intake to this level decreases your risk of breast cancer and liver damage. And of course, no recreational drugs are part of a healthy lifestyle.  And absolutely no smoking or even second hand smoke. Most people know smoking can cause heart and lung diseases, but did you know it also contributes to weakening of your bones? Aging of your skin?  Yellowing of your teeth and nails?  CALCIUM AND VITAMIN D:  Adequate intake of calcium and Vitamin D are recommended.  The recommendations for exact amounts of these supplements seem to change often, but generally speaking 600 mg of calcium (either carbonate or citrate) and 800 units of Vitamin D per day seems prudent. Certain women may benefit from higher intake of Vitamin D.  If you are among these women, your doctor will have told you during your visit.    PAP SMEARS:  Pap smears, to check for cervical cancer or precancers,  have traditionally been done yearly, although recent scientific advances have shown that most women can have pap smears less often.  However, every woman still should have a physical exam from her gynecologist every year. It will include a breast check, inspection of the vulva and vagina to check for abnormal growths or skin changes, a visual exam of the cervix, and then an exam to evaluate the size and shape of the uterus and  ovaries.  And after 58 years of age, a rectal exam is indicated to check for rectal cancers. We will also provide age appropriate advice regarding health maintenance, like when you should have certain vaccines, screening for sexually transmitted diseases, bone density testing, colonoscopy, mammograms, etc.   MAMMOGRAMS:  All women over 40 years old should have a yearly mammogram. Many facilities now offer a "3D" mammogram, which may cost around $50 extra out of pocket. If possible,  we recommend you accept the option to have the 3D mammogram performed.  It both reduces the number of women who will be called back for extra views which then turn out to be normal, and it is better than the routine mammogram at detecting truly abnormal areas.    COLONOSCOPY:  Colonoscopy to screen for colon cancer is recommended for all women at age 50.  We know, you hate the idea of the prep.  We agree, BUT, having colon cancer and not knowing it is worse!!  Colon cancer so often starts as a polyp that can be seen and removed at colonscopy, which can quite literally save your life!  And if your first colonoscopy is normal and you have no family history of colon cancer, most women don't have to have it again for 10 years.  Once every ten years, you can do something that may end up saving your life, right?  We will be happy to help you get it scheduled when you are ready.    Be sure to check your insurance coverage so you understand how much it will cost.  It may be covered as a preventative service at no cost, but you should check your particular policy.     Overactive Bladder, Adult  Overactive bladder refers to a condition in which a person has a sudden need to pass urine. The person may leak urine if he or she cannot get to the bathroom fast enough (urinary incontinence). A person with this condition may also wake up several times in the night to go to the bathroom. Overactive bladder is associated with poor nerve signals  between your bladder and your brain. Your bladder may get the signal to empty before it is full. You may also have very sensitive muscles that make your bladder squeeze too soon. These symptoms might interfere with daily work or social activities. What are the causes? This condition may be associated with or caused by:  Urinary tract infection.  Infection of nearby tissues, such as the prostate.  Prostate enlargement.  Surgery on the uterus or urethra.  Bladder stones, inflammation, or tumors.  Drinking too much caffeine or alcohol.  Certain medicines, especially medicines that get rid of extra fluid in the body (diuretics).  Muscle or nerve weakness, especially from: ? A spinal cord injury. ? Stroke. ? Multiple sclerosis. ? Parkinson's disease.  Diabetes.  Constipation. What increases the risk? You may be at greater risk for overactive bladder if you:  Are an older adult.  Smoke.  Are going through menopause.  Have prostate problems.  Have a neurological disease, such as stroke, dementia, Parkinson's disease, or multiple sclerosis (MS).  Eat or drink things that irritate the bladder. These include alcohol, spicy food, and caffeine.  Are overweight or obese. What are the signs or symptoms? Symptoms of this condition include:  Sudden, strong urge to urinate.  Leaking urine.  Urinating 8 or more times a day.  Waking up to urinate 2 or more times a night. How is this diagnosed? Your health care provider may suspect overactive bladder based on your symptoms. He or she will diagnose this condition by:  A physical exam and medical history.  Blood or urine tests. You might need bladder or urine tests to help determine what is causing your overactive bladder. You might also need to see a health care provider who specializes in urinary tract problems (urologist). How is this treated? Treatment for overactive bladder depends on the cause of your condition and  whether it is mild or severe. You can also make lifestyle changes at home. Options include:  Bladder training. This may include: ? Learning to control the urge to urinate by following a schedule that directs you to urinate at regular intervals (timed voiding). ? Doing Kegel exercises to strengthen your pelvic floor muscles, which support your bladder. Toning these muscles can help you control urination, even if your bladder muscles are overactive.  Special devices. This may include: ? Biofeedback, which uses sensors to help you become aware of your body's signals. ? Electrical stimulation, which uses electrodes placed inside the body (implanted) or outside the body. These electrodes send gentle pulses of electricity to strengthen the nerves or muscles that control the bladder. ? Women may use a plastic device that fits into the vagina and supports the bladder (pessary).  Medicines. ? Antibiotics to treat bladder infection. ? Antispasmodics to stop the bladder from releasing urine at the wrong time. ? Tricyclic antidepressants to relax bladder muscles. ? Injections of  botulinum toxin type A directly into the bladder tissue to relax bladder muscles.  Lifestyle changes. This may include: ? Weight loss. Talk to your health care provider about weight loss methods that would work best for you. ? Diet changes. This may include reducing how much alcohol and caffeine you consume, or drinking fluids at different times of the day. ? Not smoking. Do not use any products that contain nicotine or tobacco, such as cigarettes and e-cigarettes. If you need help quitting, ask your health care provider.  Surgery. ? A device may be implanted to help manage the nerve signals that control urination. ? An electrode may be implanted to stimulate electrical signals in the bladder. ? A procedure may be done to change the shape of the bladder. This is done only in very severe cases. Follow these instructions at  home: Lifestyle  Make any diet or lifestyle changes that are recommended by your health care provider. These may include: ? Drinking less fluid or drinking fluids at different times of the day. ? Cutting down on caffeine or alcohol. ? Doing Kegel exercises. ? Losing weight if needed. ? Eating a healthy and balanced diet to prevent constipation. This may include:  Eating foods that are high in fiber, such as fresh fruits and vegetables, whole grains, and beans.  Limiting foods that are high in fat and processed sugars, such as fried and sweet foods. General instructions  Take over-the-counter and prescription medicines only as told by your health care provider.  If you were prescribed an antibiotic medicine, take it as told by your health care provider. Do not stop taking the antibiotic even if you start to feel better.  Use any implants or pessary as told by your health care provider.  If needed, wear pads to absorb urine leakage.  Keep a journal or log to track how much and when you drink and when you feel the need to urinate. This will help your health care provider monitor your condition.  Keep all follow-up visits as told by your health care provider. This is important. Contact a health care provider if:  You have a fever.  Your symptoms do not get better with treatment.  Your pain and discomfort get worse.  You have more frequent urges to urinate. Get help right away if:  You are not able to control your bladder. Summary  Overactive bladder refers to a condition in which a person has a sudden need to pass urine.  Several conditions may lead to an overactive bladder.  Treatment for overactive bladder depends on the cause and severity of your condition.  Follow your health care provider's instructions about lifestyle changes, doing Kegel exercises, keeping a journal, and taking medicines. This information is not intended to replace advice given to you by your health  care provider. Make sure you discuss any questions you have with your health care provider. Document Revised: 06/08/2018 Document Reviewed: 03/03/2017 Elsevier Patient Education  [place].    Oxybutynin extended-release tablets What is this medicine? OXYBUTYNIN (ox i BYOO ti nin) is used to treat overactive bladder. This medicine reduces the amount of bathroom visits. It may also help to control wetting accidents. This medicine may be used for other purposes; ask your health care provider or pharmacist if you have questions. COMMON BRAND NAME(S): Ditropan XL What should I tell my health care provider before I take this medicine? They need to know if you have any of these conditions:  autonomic neuropathy  dementia  difficulty passing urine  glaucoma  intestinal obstruction  kidney disease  liver disease  myasthenia gravis  Parkinson's disease  an unusual or allergic reaction to oxybutynin, other medicines, foods, dyes, or preservatives  pregnant or trying to get pregnant  breast-feeding How should I use this medicine? Take this medicine by mouth with a glass of water. Swallow whole, do not crush, cut, or chew. Follow the directions on the prescription label. You can take this medicine with or without food. Take your doses at regular intervals. Do not take your medicine more often than directed. Talk to your pediatrician regarding the use of this medicine in children. Special care may be needed. While this drug may be prescribed for children as young as 6 years for selected conditions, precautions do apply. Overdosage: If you think you have taken too much of this medicine contact a poison control center or emergency room at once. NOTE: This medicine is only for you. Do not share this medicine with others. What if I miss a dose? If you miss a dose, take it as soon as you can. If it is almost time for your next dose, take only that dose. Do not take double or extra  doses. What may interact with this medicine?  antihistamines for allergy, cough and cold  atropine  certain medicines for bladder problems like oxybutynin, tolterodine  certain medicines for Parkinson's disease like benztropine, trihexyphenidyl  certain medicines for stomach problems like dicyclomine, hyoscyamine  certain medicines for travel sickness like scopolamine  clarithromycin  erythromycin  ipratropium  medicines for fungal infections, like fluconazole, itraconazole, ketoconazole or voriconazole This list may not describe all possible interactions. Give your health care provider a list of all the medicines, herbs, non-prescription drugs, or dietary supplements you use. Also tell them if you smoke, drink alcohol, or use illegal drugs. Some items may interact with your medicine. What should I watch for while using this medicine? It may take a few weeks to notice the full benefit from this medicine. You may need to limit your intake tea, coffee, caffeinated sodas, and alcohol. These drinks may make your symptoms worse. You may get drowsy or dizzy. Do not drive, use machinery, or do anything that needs mental alertness until you know how this medicine affects you. Do not stand or sit up quickly, especially if you are an older patient. This reduces the risk of dizzy or fainting spells. Alcohol may interfere with the effect of this medicine. Avoid alcoholic drinks. Your mouth may get dry. Chewing sugarless gum or sucking hard candy, and drinking plenty of water may help. Contact your doctor if the problem does not go away or is severe. This medicine may cause dry eyes and blurred vision. If you wear contact lenses, you may feel some discomfort. Lubricating drops may help. See your eyecare professional if the problem does not go away or is severe. You may notice the shells of the tablets in your stool from time to time. This is normal. Avoid extreme heat. This medicine can cause you to  sweat less than normal. Your body temperature could increase to dangerous levels, which may lead to heat stroke. What side effects may I notice from receiving this medicine? Side effects that you should report to your doctor or health care professional as soon as possible:  allergic reactions like skin rash, itching or hives, swelling of the face, lips, or tongue  agitation  breathing problems  confusion  fever  flushing (reddening of the  skin)  hallucinations  memory loss  pain or difficulty passing urine  palpitations  unusually weak or tired Side effects that usually do not require medical attention (report to your doctor or health care professional if they continue or are bothersome):  constipation  headache  sexual difficulties (impotence) This list may not describe all possible side effects. Call your doctor for medical advice about side effects. You may report side effects to FDA at 1-800-FDA-1088. Where should I keep my medicine? Keep out of the reach of children. Store at room temperature between 15 and 30 degrees C (59 and 86 degrees F). Protect from moisture and humidity. Throw away any unused medicine after the expiration date. NOTE: This sheet is a summary. It may not cover all possible information. If you have questions about this medicine, talk to your doctor, pharmacist, or health care provider.  2020 Elsevier/Gold Standard (2013-05-03 10:57:06)

## 2019-12-19 NOTE — Addendum Note (Signed)
Addended by: Lowella Fairy on: 12/19/2019 02:01 PM   Modules accepted: Orders

## 2019-12-20 ENCOUNTER — Telehealth: Payer: Self-pay | Admitting: *Deleted

## 2019-12-20 ENCOUNTER — Encounter: Payer: Self-pay | Admitting: Obstetrics and Gynecology

## 2019-12-20 NOTE — Telephone Encounter (Signed)
[facility] Hi, I just wanted to double check on the instructions. I'm sure you said to take this medication for overactive bladder in the mornings.  On the prescription bottle label it says take it at bedtime, so I just want to confirm or verify that I do remember correctly and that you said to take it in the morning.  thank you have a great day  Alejandra Miller with patient. Advised to take oxybutynin (Ditropan) in the morning. Encouraged to drink water.  Reviewed potential side effects with patient per UpToDate. Questions answered. Patient is aware to call if any concerns.    Routing to provider for final review. Patient is agreeable to disposition. Will close encounter.

## 2019-12-20 NOTE — Telephone Encounter (Signed)
See telephone encounter dated 12/20/19.   Encounter closed.

## 2019-12-21 LAB — URINE CULTURE: Organism ID, Bacteria: NO GROWTH

## 2019-12-24 ENCOUNTER — Ambulatory Visit: Payer: 59 | Admitting: Obstetrics and Gynecology

## 2020-01-03 ENCOUNTER — Encounter: Payer: Self-pay | Admitting: Family Medicine

## 2020-01-03 DIAGNOSIS — J3089 Other allergic rhinitis: Secondary | ICD-10-CM

## 2020-01-03 MED ORDER — BENZONATATE 100 MG PO CAPS: 100.0000 mg | ORAL_CAPSULE | Freq: Two times a day (BID) | ORAL | 1 refills | Status: DC | PRN

## 2020-01-04 ENCOUNTER — Encounter: Payer: Self-pay | Admitting: Family Medicine

## 2020-01-04 MED ORDER — IBUPROFEN 800 MG PO TABS: 800.0000 mg | ORAL_TABLET | Freq: Three times a day (TID) | ORAL | 1 refills | Status: DC

## 2020-02-08 ENCOUNTER — Ambulatory Visit: Payer: 59 | Admitting: Obstetrics and Gynecology

## 2020-02-15 ENCOUNTER — Encounter: Payer: Self-pay | Admitting: Obstetrics and Gynecology

## 2020-02-15 ENCOUNTER — Other Ambulatory Visit: Payer: Self-pay

## 2020-02-15 ENCOUNTER — Ambulatory Visit (INDEPENDENT_AMBULATORY_CARE_PROVIDER_SITE_OTHER): Payer: 59 | Admitting: Obstetrics and Gynecology

## 2020-02-15 VITALS — BP 118/80 | HR 76 | Ht 63.5 in | Wt 248.0 lb

## 2020-02-15 DIAGNOSIS — N3281 Overactive bladder: Secondary | ICD-10-CM | POA: Diagnosis not present

## 2020-02-15 MED ORDER — OXYBUTYNIN CHLORIDE ER 15 MG PO TB24: ORAL_TABLET | ORAL | 2 refills | Status: DC

## 2020-02-15 NOTE — Progress Notes (Signed)
GYNECOLOGY  VISIT   HPI: 59 y.o.   Married  Caucasian  female   G0P0 with No LMP recorded. (Menstrual status: IUD).   here for follow up after starting Oxybutynin. Per patient has noticed a change some.     The need to double void with washing her hands or flushing the toilet has decreased some but not gone away completely.  Can get out of the car and feels an urgency to get in the house to empty her bladder.  It is happening less often but it still occurs.   DF still every 2- 3 hours.  NF just once in the early am.   No increased dry mouth and no constipation.   Lately is having 2 cups of coffee.  Has fewer diet citrus green tea but more Crystal Light.   GYNECOLOGIC HISTORY: No LMP recorded. (Menstrual status: IUD). Contraception:  Vasectomy and Mirena IUD  Menopausal hormone therapy:  none Last mammogram:  03/05/19 BIRADS 1 negative/density b Last pap smear:   12/03/16 Neg:Neg HR HPV        OB History    Gravida  0   Para      Term      Preterm      AB      Living        SAB      IAB      Ectopic      Multiple      Live Births                 Patient Active Problem List   Diagnosis Date Noted  . Insulin resistance 04/10/2018  . Class 3 severe obesity with serious comorbidity and body mass index (BMI) of 40.0 to 44.9 in adult ([place]) 04/10/2018  . Osteoarthritis   . Fracture of right fibula 05/19/2015  . h/o Hyperglycemia 05/19/2015  . Bursitis of shoulder 11/05/2013  . Cervical cancer screening 07/20/2012  . BMI 40.0-44.9, adult ([place]) 02/12/2012  . Leg swelling 12/08/2011  . Annual physical exam 07/28/2011  . Menstrual irregularity 12/15/2010  . Vitamin D deficiency 12/15/2010  . h/o OSA (obstructive sleep apnea) 08/11/2010  . Arthralgia 07/28/2010  . Hyperlipidemia 07/28/2010  . Hypertension 07/28/2010  . Depression, recurrent ([place]) 07/28/2010    Past Medical History:  Diagnosis Date  . Anxiety    NO MEDS  . Arthritis   . Back pain   .  Cervical cancer screening 07/20/2012   Menarche at 13, regular til recently No h/o abnormal paps, last pap 2-3 years ago G0P0  MGM today, no h/o abnormal MGMs Spotting now monthly and scant H/o uterine polyps  . Condyloma acuminata 2020  . Depression    counseling in 2010 for 2 months on medication  . Fibrocystic breast 1988  . Fibroids 2014  . Fracture of right fibula 05/19/2015   October 2016  . GERD (gastroesophageal reflux disease) 2005   treated with omeprazole  . High cholesterol 2002  . History of sinusitis    once/twice per year  . Hyperglycemia 05/19/2015  . Hypertension 2002  . Joint pain   . MRSA (methicillin resistant staph aureus) culture positive 2007  . Muscular pain   . Obesity   . OSA (obstructive sleep apnea)    +sleep study 08/2010., DOES NOT USE CPAP, USES MOSES MOUTH PIECE  . Osteoarthritis    KNEES, HANDS, HIPS  . Osteoporosis   . Plantar fasciitis of left foot 03/2014  . Seasonal allergies  affected in the spring  . Sleep apnea    mouthpiece "[place]"  . Uterine polyp   . 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 Vitamin D deficiency   . Wrist fracture 07/2012   right    Past Surgical History:  Procedure Laterality Date  . DILATATION & CURETTAGE/HYSTEROSCOPY WITH MYOSURE N/A 07/30/2014   Procedure: DILATATION & CURETTAGE/HYSTEROSCOPY WITH MYOSURE;  Surgeon: Nunzio Cobbs, MD;  Location: [facility];  Service: Gynecology;  Laterality: N/A;  extra time for BMI 49.  Marland Kitchen DILATATION & CURRETTAGE/HYSTEROSCOPY WITH RESECTOCOPE N/A 11/24/2012   Procedure: DILATATION & CURETTAGE/HYSTEROSCOPY WITH RESECTOCOPE;  Surgeon: Arloa Koh, MD;  Location: [facility];  Service: Gynecology;  Laterality: N/A;  . ESSURE TUBAL LIGATION Left   . TONSILLECTOMY  1969  . WISDOM TOOTH EXTRACTION  1979  . WRIST FRACTURE SURGERY Right 07/2012    Current Outpatient Medications  Medication Sig Dispense Refill  . benzonatate (TESSALON) 100 MG capsule Take 1  capsule (100 mg total) by mouth 2 (two) times daily as needed for cough. 60 capsule 1  . busPIRone (BUSPAR) 7.5 MG tablet Take 1 tablet (7.5 mg total) by mouth 2 (two) times daily. 180 tablet 3  . CALCIUM PO Take 1 tablet by mouth 2 (two) times daily.     . Coenzyme Q10 (CO Q 10 PO) Take by mouth daily.    . cyclobenzaprine (FLEXERIL) 10 MG tablet Take 1 tablet (10 mg total) by mouth daily as needed. 90 tablet 3  . fluticasone (FLONASE) 50 MCG/ACT nasal spray Place into both nostrils daily.    Marland Kitchen guaiFENesin (MUCINEX) 600 MG 12 hr tablet Take 1 tablet (600 mg total) by mouth 2 (two) times daily as needed for cough or to loosen phlegm. 14 tablet 0  . ibuprofen (ADVIL) 800 MG tablet Take 1 tablet (800 mg total) by mouth 3 (three) times daily. 60 tablet 1  . levonorgestrel (MIRENA) 20 MCG/24HR IUD 1 each by Intrauterine route once.    Marland Kitchen lisinopril-hydrochlorothiazide (ZESTORETIC) 20-25 MG tablet Take 1 tablet by mouth daily. 90 tablet 3  . MAGNESIUM PO Take 500 mg by mouth.    . Melatonin 3 MG TABS Take 1 tablet by mouth at bedtime.    . montelukast (SINGULAIR) 10 MG tablet Take 1 tablet (10 mg total) by mouth at bedtime. 90 tablet 3  . Multiple Vitamin (MULTIVITAMIN) tablet Take 1 tablet by mouth daily.    . mupirocin cream (BACTROBAN) 2 % Apply 1 application topically as needed. 15 g 1  . Omega-3 Fatty Acids (FISH OIL) 1000 MG CAPS Take 1,000 mg by mouth daily.    Marland Kitchen omeprazole (PRILOSEC) 20 MG capsule Take 20 mg by mouth daily as needed.    Marland Kitchen oxybutynin (DITROPAN XL) 10 MG 24 hr tablet Take 1 tablet (10 mg total) by mouth at bedtime. 30 tablet 1  . POTASSIUM PO Take 99 mg by mouth.    . Probiotic Product (PROBIOTIC PO) Take by mouth.    . sodium chloride (OCEAN) 0.65 % SOLN nasal spray Place 1 spray into both nostrils as needed for congestion. 15 mL 0  . TURMERIC PO Take by mouth.    . Vilazodone HCl (VIIBRYD) 40 MG TABS Take 1 tablet (40 mg total) by mouth daily. 90 tablet 3  . Vitamin D,  Ergocalciferol, (DRISDOL) 1.25 MG (50000 UT) CAPS capsule Take 1 capsule (50,000 Units total) by mouth every 7 (seven) days. 12  capsule 3  . triamcinolone (NASACORT) 55 MCG/ACT AERO nasal inhaler Place 2 sprays into the nose daily. (Patient not taking: Reported on 02/15/2020)     No current facility-administered medications for this visit.     ALLERGIES: Bupropion  Family History  Problem Relation Age of Onset  . Osteoarthritis Mother   . Hyperlipidemia Mother   . Depression Mother   . Alzheimer's disease Mother   . Hypertension Mother   . Sleep apnea Mother   . Obesity Mother   . Liver cancer Maternal Grandmother        mets to breast  . Breast cancer Maternal Grandmother        liver mets to breast  . Cancer Maternal Grandmother        with mets  . Hyperlipidemia Father   . Coronary artery disease Father   . Hypertension Father   . Stroke Father        multiple  . Diabetes Father   . Obesity Father   . Sleep apnea Father   . Colon cancer Neg Hx     Social History   Socioeconomic History  . Marital status: Married    Spouse name: Lexany Belknap  . Number of children: Not on file  . Years of education: Not on file  . Highest education level: Not on file  Occupational History  . Not on file  Tobacco Use  . Smoking status: Former Smoker    Packs/day: 0.50    Years: 6.00    Pack years: 3.00    Types: Cigarettes    Quit date: 03/01/1989    Years since quitting: 30.9  . Smokeless tobacco: Never Used  . Tobacco comment: Quit in 1991- started in 1983 1/2ppd  Vaping Use  . Vaping Use: Never used  Substance and Sexual Activity  . Alcohol use: Yes    Alcohol/week: 0.0 standard drinks    Comment: 2 drinks/month  . Drug use: No  . Sexual activity: Not Currently    Partners: Male    Birth control/protection: Other-see comments, I.U.D.    Comment: vasectomy/Mirena IUD inserted 03-04-16  Other Topics Concern  . Not on file  Social History Narrative  . Not on file    Social Determinants of Health   Financial Resource Strain: Not on file  Food Insecurity: Not on file  Transportation Needs: Not on file  Physical Activity: Not on file  Stress: Not on file  Social Connections: Not on file  Intimate Partner Violence: Not on file    Review of Systems  Constitutional: Negative.   HENT: Negative.   Eyes: Negative.   Respiratory: Negative.   Cardiovascular: Negative.   Gastrointestinal: Negative.   Endocrine: Negative.   Genitourinary: Negative.   Musculoskeletal: Negative.   Skin: Negative.   Allergic/Immunologic: Negative.   Neurological: Negative.   Hematological: Negative.   Psychiatric/Behavioral: Negative.     PHYSICAL EXAMINATION:    BP 118/80 (BP Location: Right Arm, Patient Position: Sitting, Cuff Size: Large)   Pulse 76   Ht 5' 3.5" (1.613 m)   Wt 248 lb (112.5 kg)   BMI 43.24 kg/m     General appearance: alert, cooperative and appears stated age  ASSESSMENT  Overactive bladder improved somewhat with Ditropan Xl 10 mg.   PLAN  Increase Ditropan XL to 15 mg daily. #90, RF 2.  Start pelvic floor exercises.  Up to Date information printed for patient.  Reduce dietary irritants.  She will let me know if the  new dosage of her Ditropan is not working and she will also contact me if she wishes to do formal pelvic floor therapy. Fu for annual exam and prn.   24 min  total time was spent for this patient encounter, including preparation, face-to-face counseling with the patient, coordination of care, and documentation of the encounter.

## 2020-02-15 NOTE — Patient Instructions (Signed)

## 2020-02-21 ENCOUNTER — Encounter: Payer: Self-pay | Admitting: Family Medicine

## 2020-02-21 DIAGNOSIS — J3089 Other allergic rhinitis: Secondary | ICD-10-CM

## 2020-02-21 MED ORDER — BENZONATATE 100 MG PO CAPS: 100.0000 mg | ORAL_CAPSULE | Freq: Two times a day (BID) | ORAL | 1 refills | Status: DC | PRN

## 2020-02-27 ENCOUNTER — Other Ambulatory Visit: Payer: Self-pay

## 2020-02-27 ENCOUNTER — Encounter: Payer: Self-pay | Admitting: Family

## 2020-02-27 ENCOUNTER — Ambulatory Visit: Payer: 59 | Admitting: Family

## 2020-02-27 VITALS — BP 136/80 | HR 102 | Temp 97.7°F | Ht 63.0 in | Wt 251.2 lb

## 2020-02-27 DIAGNOSIS — Z8614 Personal history of Methicillin resistant Staphylococcus aureus infection: Secondary | ICD-10-CM | POA: Diagnosis not present

## 2020-02-27 DIAGNOSIS — L739 Follicular disorder, unspecified: Secondary | ICD-10-CM | POA: Diagnosis not present

## 2020-02-27 MED ORDER — SULFAMETHOXAZOLE-TRIMETHOPRIM 800-160 MG PO TABS: 1.0000 | ORAL_TABLET | Freq: Two times a day (BID) | ORAL | 0 refills | Status: DC

## 2020-02-27 NOTE — Patient Instructions (Signed)
MRSA Infection, Diagnosis, Adult Methicillin-resistant Staphylococcus aureus (MRSA) infection is caused by bacteria called Staphylococcus aureus, or staph, that no longer respond to common antibiotic medicines (drug-resistant bacteria). MRSA infection can be hard to treat. Most of the time, MRSA can be on the skin or in the nose without causing problems (colonized). However, if MRSA enters the body through a cut, a sore, or an invasive medical device, it can cause a serious infection. What are the causes? This condition is caused by staph bacteria. Illness may develop after exposure to the bacteria through:  Skin-to-skin contact with someone who is infected with MRSA.  Touching surfaces that have the bacteria on them.  Having a procedure or using equipment that allows MRSA to enter the body.  Having MRSA that lives on your skin and then enters your body through: ? A cut or scratch. ? A surgery or procedure. ? The use of a medical device. Contact with the bacteria may occur:  During a stay in a hospital, rehabilitation facility, nursing home, or other health care facility (health care-associated MRSA).  In daily activities where there is close contact with others, such as sports, child care centers, or at home (community-associated MRSA). What increases the risk? You are more likely to develop this condition if you:  Have a surgery or procedure.  Have an IV or a thin tube (catheter) placed in your body.  Are elderly.  Are on kidney dialysis.  Have recently taken an antibiotic medicine.  Live in a long-term care facility.  Have a chronic wound or skin ulcer.  Have a weak body defense system (immune system).  Play sports that involve skin-to-skin contact.  Live in a crowded place, like a dormitory or [employer]  Share towels, razors, or sports equipment with other people.  Have a history of MRSA infection or colonization. What are the signs or symptoms? Symptoms  of this condition depend on the area that is affected. Symptoms may include:  A pus-filled pimple or boil.  Pus that drains from your skin.  A sore (abscess) under your skin or somewhere in your body.  Fever with or without chills.  Difficulty breathing.  Coughing up blood.  Redness, warmth, swelling, or pain in the affected area. How is this diagnosed? This condition may be diagnosed based on:  A physical exam.  Your medical history.  Taking a sample from the infected area and growing it in a lab (culture). You may also have other tests, including:  Imaging tests, such as X-rays, a CT scan, or an MRI.  Lab tests, such as blood, urine, or phlegm (sputum) tests. You skin or nose may be swabbed when you are admitted to a health care facility for a procedure. This is to screen for MRSA. How is this treated? Treatment depends on the type of MRSA infection you have and how severe, deep, or extensive it is. Treatment may include:  Antibiotic medicines.  Surgery to drain pus from the infected area. Severe infections may require a hospital stay. Follow these instructions at home: Medicines  Take over-the-counter and prescription medicines only as told by your health care provider.  If you were prescribed an antibiotic medicine, use it as told by your health care provider. Do not stop using the antibiotic even if you start to feel better. Prevention Follow these instructions to avoid spreading the infection to others:  Wash your hands frequently with soap and water. If soap and water are not available, use an alcohol-based hand sanitizer.  Avoid close contact with those around you as much as possible. Do not use towels, razors, toothbrushes, bedding, or other items that will be used by others.  Wash towels, bedding, and clothes in the washing machine with detergent and hot water. Dry them in a hot dryer.  Clean surfaces regularly to remove germs (disinfection). Use products  or solutions that contain bleach. Make sure you disinfect bathroom surfaces, food preparation areas, exercise equipment, and doorknobs.  General instructions  If you have a wound, follow instructions from your health care provider about how to take care of your wound. ? Do not pick at scabs. ? Do not try to drain any infection sites or pimples.  Tell all your health care providers that you have MRSA, or if you have ever had a MRSA infection.  Keep all follow-up visits as told by your health care provider. This is important. Contact a health care provider if you:  Do not get better.  Have symptoms that get worse.  Have new symptoms. Get help right away if you have:  Nausea or vomiting, or if you cannot take medicine without vomiting.  Trouble breathing.  Chest pain. These symptoms may represent a serious problem that is an emergency. Do not wait to see if the symptoms will go away. Get medical help right away. Call your local emergency services (911 in the [place] Do not drive yourself to the hospital. Summary  MRSA infection is caused by bacteria called Staphylococcus aureus, or staph, that no longer respond to common antibiotic medicines.  Treatment for this condition depends on the type of MRSA infection you have and how severe, deep, and extensive it is.  If you were prescribed an antibiotic medicine, use it as told by your health care provider. Do not stop using the antibiotic even if you start to feel better.  Follow instructions from your health care provider to avoid spreading the infection to others. This information is not intended to replace advice given to you by your health care provider. Make sure you discuss any questions you have with your health care provider. Document Revised: 05/04/2018 Document Reviewed: 05/05/2018 [employer]vier Patient Education  2020 [employer]  

## 2020-02-27 NOTE — Progress Notes (Signed)
Acute Office Visit  Subjective:    Patient ID: Alejandra Miller, female    DOB: April 01, 1960, 59 y.o.   MRN: [id number]  Chief Complaint  Patient presents with  . Rash    Rash on stomach x 5 days little itchy. History of MERSA not sure if this is a flare up.     HPI Patient is in today with c/o a red, tender, rash on her stomach x 5 days. She has been applying Bactroban twice a day that has not helped much. She has a history of MRSA.   Past Medical History:  Diagnosis Date  . Anxiety    NO MEDS  . Arthritis   . Back pain   . Cervical cancer screening 07/20/2012   Menarche at 13, regular til recently No h/o abnormal paps, last pap 2-3 years ago G0P0  MGM today, no h/o abnormal MGMs Spotting now monthly and scant H/o uterine polyps  . Condyloma acuminata 2020  . Depression    counseling in 2010 for 2 months on medication  . Fibrocystic breast 1988  . Fibroids 2014  . Fracture of right fibula 05/19/2015   October 2016  . GERD (gastroesophageal reflux disease) 2005   treated with omeprazole  . High cholesterol 2002  . History of sinusitis    once/twice per year  . Hyperglycemia 05/19/2015  . Hypertension 2002  . Joint pain   . MRSA (methicillin resistant staph aureus) culture positive 2007  . Muscular pain   . Obesity   . OSA (obstructive sleep apnea)    +sleep study 08/2010., DOES NOT USE CPAP, USES MOSES MOUTH PIECE  . Osteoarthritis    KNEES, HANDS, HIPS  . Osteoporosis   . Plantar fasciitis of left foot 03/2014  . Seasonal allergies    affected in the spring  . Sleep apnea    mouthpiece "[facility]"  . Uterine polyp   . 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 Vitamin D deficiency   . Wrist fracture 07/2012   right    Past Surgical History:  Procedure Laterality Date  . DILATATION & CURETTAGE/HYSTEROSCOPY WITH MYOSURE N/A 07/30/2014   Procedure: DILATATION & CURETTAGE/HYSTEROSCOPY WITH MYOSURE;  Surgeon: Nunzio Cobbs, MD;  Location:  [facility];  Service: Gynecology;  Laterality: N/A;  extra time for BMI 49.  Marland Kitchen DILATATION & CURRETTAGE/HYSTEROSCOPY WITH RESECTOCOPE N/A 11/24/2012   Procedure: DILATATION & CURETTAGE/HYSTEROSCOPY WITH RESECTOCOPE;  Surgeon: Arloa Koh, MD;  Location: [facility];  Service: Gynecology;  Laterality: N/A;  . ESSURE TUBAL LIGATION Left   . TONSILLECTOMY  1969  . WISDOM TOOTH EXTRACTION  1979  . WRIST FRACTURE SURGERY Right 07/2012    Family History  Problem Relation Age of Onset  . Osteoarthritis Mother   . Hyperlipidemia Mother   . Depression Mother   . Alzheimer's disease Mother   . Hypertension Mother   . Sleep apnea Mother   . Obesity Mother   . Liver cancer Maternal Grandmother        mets to breast  . Breast cancer Maternal Grandmother        liver mets to breast  . Cancer Maternal Grandmother        with mets  . Hyperlipidemia Father   . Coronary artery disease Father   . Hypertension Father   . Stroke Father        multiple  . Diabetes Father   . Obesity Father   .  Sleep apnea Father   . Colon cancer Neg Hx     Social History   Socioeconomic History  . Marital status: Married    Spouse name: Jalyne Brodzinski  . Number of children: Not on file  . Years of education: Not on file  . Highest education level: Not on file  Occupational History  . Not on file  Tobacco Use  . Smoking status: Former Smoker    Packs/day: 0.50    Years: 6.00    Pack years: 3.00    Types: Cigarettes    Quit date: 03/01/1989    Years since quitting: 31.0  . Smokeless tobacco: Never Used  . Tobacco comment: Quit in 1991- started in 1983 1/2ppd  Vaping Use  . Vaping Use: Never used  Substance and Sexual Activity  . Alcohol use: Yes    Alcohol/week: 0.0 standard drinks    Comment: 2 drinks/month  . Drug use: No  . Sexual activity: Not Currently    Partners: Male    Birth control/protection: Other-see comments, I.U.D.    Comment: vasectomy/Mirena IUD inserted 03-04-16  Other Topics Concern  . Not  on file  Social History Narrative  . Not on file   Social Determinants of Health   Financial Resource Strain: Not on file  Food Insecurity: Not on file  Transportation Needs: Not on file  Physical Activity: Not on file  Stress: Not on file  Social Connections: Not on file  Intimate Partner Violence: Not on file    Outpatient Medications Prior to Visit  Medication Sig Dispense Refill  . benzonatate (TESSALON) 100 MG capsule Take 1 capsule (100 mg total) by mouth 2 (two) times daily as needed for cough. 60 capsule 1  . busPIRone (BUSPAR) 7.5 MG tablet Take 1 tablet (7.5 mg total) by mouth 2 (two) times daily. 180 tablet 3  . CALCIUM PO Take 1 tablet by mouth 2 (two) times daily.     . Coenzyme Q10 (CO Q 10 PO) Take by mouth daily.    . cyclobenzaprine (FLEXERIL) 10 MG tablet Take 1 tablet (10 mg total) by mouth daily as needed. 90 tablet 3  . fluticasone (FLONASE) 50 MCG/ACT nasal spray Place into both nostrils daily.    Marland Kitchen guaiFENesin (MUCINEX) 600 MG 12 hr tablet Take 1 tablet (600 mg total) by mouth 2 (two) times daily as needed for cough or to loosen phlegm. 14 tablet 0  . ibuprofen (ADVIL) 800 MG tablet Take 1 tablet (800 mg total) by mouth 3 (three) times daily. 60 tablet 1  . levonorgestrel (MIRENA) 20 MCG/24HR IUD 1 each by Intrauterine route once.    Marland Kitchen lisinopril-hydrochlorothiazide (ZESTORETIC) 20-25 MG tablet Take 1 tablet by mouth daily. 90 tablet 3  . MAGNESIUM PO Take 500 mg by mouth.    . Melatonin 3 MG TABS Take 1 tablet by mouth at bedtime.    . montelukast (SINGULAIR) 10 MG tablet Take 1 tablet (10 mg total) by mouth at bedtime. 90 tablet 3  . Multiple Vitamin (MULTIVITAMIN) tablet Take 1 tablet by mouth daily.    . mupirocin cream (BACTROBAN) 2 % Apply 1 application topically as needed. 15 g 1  . Omega-3 Fatty Acids (FISH OIL) 1000 MG CAPS Take 1,000 mg by mouth daily.    Marland Kitchen omeprazole (PRILOSEC) 20 MG capsule Take 20 mg by mouth daily as needed.    Marland Kitchen oxybutynin  (DITROPAN XL) 15 MG 24 hr tablet Take one tablet (15 mg) by mouth daily. 90  tablet 2  . POTASSIUM PO Take 99 mg by mouth.    . Probiotic Product (PROBIOTIC PO) Take by mouth.    . sodium chloride (OCEAN) 0.65 % SOLN nasal spray Place 1 spray into both nostrils as needed for congestion. 15 mL 0  . triamcinolone (NASACORT) 55 MCG/ACT AERO nasal inhaler Place 2 sprays into the nose daily.    . TURMERIC PO Take by mouth.    . Vilazodone HCl (VIIBRYD) 40 MG TABS Take 1 tablet (40 mg total) by mouth daily. 90 tablet 3  . Vitamin D, Ergocalciferol, (DRISDOL) 1.25 MG (50000 UT) CAPS capsule Take 1 capsule (50,000 Units total) by mouth every 7 (seven) days. 12 capsule 3   No facility-administered medications prior to visit.    Allergies  Allergen Reactions  . Bupropion Other (See Comments)    Makes her feel devoid of emotions    Review of Systems  Constitutional: Negative.   Respiratory: Negative.   Cardiovascular: Negative.   Endocrine: Negative.   Musculoskeletal: Negative.   Skin: Positive for rash.  Psychiatric/Behavioral: Negative.        Objective:    Physical Exam Constitutional:      Appearance: Normal appearance.  Cardiovascular:     Rate and Rhythm: Normal rate and regular rhythm.  Pulmonary:     Effort: Pulmonary effort is normal.     Breath sounds: Normal breath sounds.  Musculoskeletal:        General: Normal range of motion.     Cervical back: Normal range of motion and neck supple.  Skin:    General: Skin is warm and dry.     Findings: Erythema and rash present.     Comments: Tender papular lesion noted to the stomach.    Neurological:     General: No focal deficit present.     Mental Status: She is alert and oriented to person, place, and time.  Psychiatric:        Mood and Affect: Mood normal.        Behavior: Behavior normal.     BP 136/80   Pulse (!) 102   Temp 97.7 F (36.5 C) (Temporal)   Ht 5\' 3"  (1.6 m)   Wt 251 lb 3.2 oz (113.9 kg)   SpO2  96%   BMI 44.50 kg/m  Wt Readings from Last 3 Encounters:  02/27/20 251 lb 3.2 oz (113.9 kg)  02/15/20 248 lb (112.5 kg)  12/19/19 248 lb (112.5 kg)    Health Maintenance Due  Topic Date Due  . PAP SMEAR-Modifier  12/04/2019    There are no preventive care reminders to display for this patient.   Lab Results  Component Value Date   TSH 0.910 03/13/2018   Lab Results  Component Value Date   WBC 8.3 03/13/2018   HGB 15.3 03/13/2018   HCT 45.8 03/13/2018   MCV 85 03/13/2018   PLT 372 06/24/2016   Lab Results  Component Value Date   NA 139 06/15/2019   K 4.4 06/15/2019   CO2 31 06/15/2019   GLUCOSE 103 (H) 06/15/2019   BUN 15 06/15/2019   CREATININE 0.79 06/15/2019   BILITOT 0.4 04/02/2019   ALKPHOS 70 04/02/2019   AST 23 06/15/2019   ALT 27 06/15/2019   PROT 7.0 04/02/2019   ALBUMIN 4.7 04/02/2019   CALCIUM 9.5 06/15/2019   ANIONGAP 7 07/26/2014   GFR 74.68 06/15/2019   Lab Results  Component Value Date   CHOL 248 (H) 06/15/2019  Lab Results  Component Value Date   HDL 40.20 06/15/2019   Lab Results  Component Value Date   LDLCALC 181 (H) 06/15/2019   Lab Results  Component Value Date   TRIG 134.0 06/15/2019   Lab Results  Component Value Date   CHOLHDL 6 06/15/2019   Lab Results  Component Value Date   HGBA1C 5.2 04/02/2019       Assessment & Plan:   Problem List Items Addressed This Visit   None   Visit Diagnoses    Folliculitis    -  Primary   History of MRSA infection       Relevant Medications   sulfamethoxazole-trimethoprim (BACTRIM DS) 800-160 MG tablet       Meds ordered this encounter  Medications  . sulfamethoxazole-trimethoprim (BACTRIM DS) 800-160 MG tablet    Sig: Take 1 tablet by mouth 2 (two) times daily.    Dispense:  14 tablet    Refill:  0   Call the office with any questions or concerns.    Kennyth Arnold, FNP

## 2020-03-03 ENCOUNTER — Other Ambulatory Visit: Payer: Self-pay | Admitting: Family Medicine

## 2020-03-05 NOTE — Telephone Encounter (Signed)
Last OV 02/27/20 Last fill 01/04/20 #60/1

## 2020-03-28 ENCOUNTER — Telehealth (INDEPENDENT_AMBULATORY_CARE_PROVIDER_SITE_OTHER): Payer: 59 | Admitting: Family Medicine

## 2020-03-28 ENCOUNTER — Encounter: Payer: Self-pay | Admitting: Family Medicine

## 2020-03-28 VITALS — Wt 240.0 lb

## 2020-03-28 DIAGNOSIS — F419 Anxiety disorder, unspecified: Secondary | ICD-10-CM | POA: Diagnosis not present

## 2020-03-28 DIAGNOSIS — L509 Urticaria, unspecified: Secondary | ICD-10-CM

## 2020-03-28 MED ORDER — LORAZEPAM 1 MG PO TABS: ORAL_TABLET | ORAL | 1 refills | Status: DC

## 2020-03-28 NOTE — Progress Notes (Signed)
Virtual Visit via Video Note  I connected with Alejandra Miller on 03/28/20 at  9:30 AM EST by a video enabled telemedicine application and verified that I am speaking with the correct person using two identifiers. Location patient: home Location provider: work or home office Persons participating in the virtual visit: patient, provider  I discussed the limitations of evaluation and management by telemedicine and the availability of in person appointments. The patient expressed understanding and agreed to proceed.  Chief Complaint  Patient presents with  . Acute Visit    C/o having hives to something she took 2 days ago, has gotten better after having a tele-doc visit on weds evening.   She wants to get anxiety medication.       HPI: Alejandra Miller is a 60 y.o. female seen today for 2 issues/concerns: 1. She was experiencing hives but much improved since starting prednisone taper (60mg  x 2 days, 40mg  x 2 days, 1 tab x 2 days). She is also using OTC cream, benadryl 50mg  qHS. The prednisone was Rx'd by telehealth provider. Unclear etiology. No previous h/o hives. 2. Pt notes a h/o anxiety, which has been well managed overall. She had previous Rx for lorazepam 1mg  tabs 1/2 to 1 tab daily PRN. Recently she notes increased anxiety/stress related to her job. She is changing jobs next month and is happy about that. She is requesting refill of PRN lorazepam.   Depression screen [facility] 2/9 03/28/2020 02/27/2020 06/15/2019  Decreased Interest 0 0 0  Down, Depressed, Hopeless 0 1 1  PHQ - 2 Score 0 1 1  Altered sleeping 0 - 3  Tired, decreased energy 0 - 1  Change in appetite 0 - 1  Feeling bad or failure about yourself  0 - 1  Trouble concentrating 0 - 0  Moving slowly or fidgety/restless 0 - 0  Suicidal thoughts 0 - 0  PHQ-9 Score 0 - 7  Difficult doing work/chores Not difficult at all - Not difficult at all   GAD 7 : Generalized Anxiety Score 06/15/2019 01/19/2019 10/26/2018  Nervous, Anxious, on  Edge 1 2 1   Control/stop worrying 1 2 0  Worry too much - different things 1 2 1   Trouble relaxing 1 2 1   Restless 0 0 0  Easily annoyed or irritable 2 3 3   Afraid - awful might happen 2 2 0  Total GAD 7 Score 8 13 6   Anxiety Difficulty Not difficult at all Somewhat difficult -       Past Medical History:  Diagnosis Date  . Anxiety    NO MEDS  . Arthritis   . Back pain   . Cervical cancer screening 07/20/2012   Menarche at 13, regular til recently No h/o abnormal paps, last pap 2-3 years ago G0P0  MGM today, no h/o abnormal MGMs Spotting now monthly and scant H/o uterine polyps  . Condyloma acuminata 2020  . Depression    counseling in 2010 for 2 months on medication  . Fibrocystic breast 1988  . Fibroids 2014  . Fracture of right fibula 05/19/2015   October 2016  . GERD (gastroesophageal reflux disease) 2005   treated with omeprazole  . High cholesterol 2002  . History of sinusitis    once/twice per year  . Hyperglycemia 05/19/2015  . Hypertension 2002  . Joint pain   . MRSA (methicillin resistant staph aureus) culture positive 2007  . Muscular pain   . Obesity   . OSA (obstructive sleep apnea)    +  sleep study 08/2010., DOES NOT USE CPAP, USES MOSES MOUTH PIECE  . Osteoarthritis    KNEES, HANDS, HIPS  . Osteoporosis   . Plantar fasciitis of left foot 03/2014  . Seasonal allergies    affected in the spring  . Sleep apnea    mouthpiece "[facility]"  . Uterine polyp   . 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 Vitamin D deficiency   . Wrist fracture 07/2012   right    Past Surgical History:  Procedure Laterality Date  . DILATATION & CURETTAGE/HYSTEROSCOPY WITH MYOSURE N/A 07/30/2014   Procedure: DILATATION & CURETTAGE/HYSTEROSCOPY WITH MYOSURE;  Surgeon: Nunzio Cobbs, MD;  Location: [facility];  Service: Gynecology;  Laterality: N/A;  extra time for BMI 49.  Marland Kitchen DILATATION & CURRETTAGE/HYSTEROSCOPY WITH RESECTOCOPE N/A 11/24/2012    Procedure: DILATATION & CURETTAGE/HYSTEROSCOPY WITH RESECTOCOPE;  Surgeon: Arloa Koh, MD;  Location: [facility];  Service: Gynecology;  Laterality: N/A;  . ESSURE TUBAL LIGATION Left   . TONSILLECTOMY  1969  . WISDOM TOOTH EXTRACTION  1979  . WRIST FRACTURE SURGERY Right 07/2012    Family History  Problem Relation Age of Onset  . Osteoarthritis Mother   . Hyperlipidemia Mother   . Depression Mother   . Alzheimer's disease Mother   . Hypertension Mother   . Sleep apnea Mother   . Obesity Mother   . Liver cancer Maternal Grandmother        mets to breast  . Breast cancer Maternal Grandmother        liver mets to breast  . Cancer Maternal Grandmother        with mets  . Hyperlipidemia Father   . Coronary artery disease Father   . Hypertension Father   . Stroke Father        multiple  . Diabetes Father   . Obesity Father   . Sleep apnea Father   . Colon cancer Neg Hx     Social History   Tobacco Use  . Smoking status: Former Smoker    Packs/day: 0.50    Years: 6.00    Pack years: 3.00    Types: Cigarettes    Quit date: 03/01/1989    Years since quitting: 31.0  . Smokeless tobacco: Never Used  . Tobacco comment: Quit in 1991- started in 1983 1/2ppd  Vaping Use  . Vaping Use: Never used  Substance Use Topics  . Alcohol use: Yes    Alcohol/week: 0.0 standard drinks    Comment: 2 drinks/month  . Drug use: No     Current Outpatient Medications:  .  benzonatate (TESSALON) 100 MG capsule, Take 1 capsule (100 mg total) by mouth 2 (two) times daily as needed for cough., Disp: 60 capsule, Rfl: 1 .  busPIRone (BUSPAR) 7.5 MG tablet, Take 1 tablet (7.5 mg total) by mouth 2 (two) times daily., Disp: 180 tablet, Rfl: 3 .  CALCIUM PO, Take 1 tablet by mouth 2 (two) times daily. , Disp: , Rfl:  .  Coenzyme Q10 (CO Q 10 PO), Take by mouth daily., Disp: , Rfl:  .  cyclobenzaprine (FLEXERIL) 10 MG tablet, Take 1 tablet (10 mg total) by mouth daily as needed., Disp: 90 tablet, Rfl:  3 .  fluticasone (FLONASE) 50 MCG/ACT nasal spray, Place into both nostrils daily., Disp: , Rfl:  .  ibuprofen (ADVIL) 800 MG tablet, TAKE 1 TABLET BY MOUTH THREE TIMES DAILY, Disp: 60 tablet, Rfl: 0 .  ibuprofen (ADVIL) 800 MG tablet, TAKE 1 TABLET BY MOUTH THREE TIMES DAILY, Disp: 60 tablet, Rfl: 0 .  levonorgestrel (MIRENA) 20 MCG/24HR IUD, 1 each by Intrauterine route once., Disp: , Rfl:  .  lisinopril-hydrochlorothiazide (ZESTORETIC) 20-25 MG tablet, Take 1 tablet by mouth daily., Disp: 90 tablet, Rfl: 3 .  MAGNESIUM PO, Take 500 mg by mouth., Disp: , Rfl:  .  Melatonin 3 MG TABS, Take 1 tablet by mouth at bedtime., Disp: , Rfl:  .  montelukast (SINGULAIR) 10 MG tablet, Take 1 tablet (10 mg total) by mouth at bedtime., Disp: 90 tablet, Rfl: 3 .  Multiple Vitamin (MULTIVITAMIN) tablet, Take 1 tablet by mouth daily., Disp: , Rfl:  .  mupirocin cream (BACTROBAN) 2 %, Apply 1 application topically as needed., Disp: 15 g, Rfl: 1 .  Omega-3 Fatty Acids (FISH OIL) 1000 MG CAPS, Take 1,000 mg by mouth daily., Disp: , Rfl:  .  omeprazole (PRILOSEC) 20 MG capsule, Take 20 mg by mouth daily as needed., Disp: , Rfl:  .  oxybutynin (DITROPAN XL) 15 MG 24 hr tablet, Take one tablet (15 mg) by mouth daily., Disp: 90 tablet, Rfl: 2 .  POTASSIUM PO, Take 99 mg by mouth., Disp: , Rfl:  .  predniSONE (DELTASONE) 20 MG tablet, Take 20 mg by mouth 2 (two) times daily., Disp: , Rfl:  .  Probiotic Product (PROBIOTIC PO), Take by mouth., Disp: , Rfl:  .  sodium chloride (OCEAN) 0.65 % SOLN nasal spray, Place 1 spray into both nostrils as needed for congestion., Disp: 15 mL, Rfl: 0 .  triamcinolone (NASACORT) 55 MCG/ACT AERO nasal inhaler, Place 2 sprays into the nose daily., Disp: , Rfl:  .  TURMERIC PO, Take by mouth., Disp: , Rfl:  .  Vilazodone HCl (VIIBRYD) 40 MG TABS, Take 1 tablet (40 mg total) by mouth daily., Disp: 90 tablet, Rfl: 3 .  Vitamin D, Ergocalciferol, (DRISDOL) 1.25 MG (50000 UT) CAPS capsule,  Take 1 capsule (50,000 Units total) by mouth every 7 (seven) days., Disp: 12 capsule, Rfl: 3 .  guaiFENesin (MUCINEX) 600 MG 12 hr tablet, Take 1 tablet (600 mg total) by mouth 2 (two) times daily as needed for cough or to loosen phlegm. (Patient not taking: Reported on 03/28/2020), Disp: 14 tablet, Rfl: 0 .  sulfamethoxazole-trimethoprim (BACTRIM DS) 800-160 MG tablet, Take 1 tablet by mouth 2 (two) times daily. (Patient not taking: Reported on 03/28/2020), Disp: 14 tablet, Rfl: 0  Allergies  Allergen Reactions  . Bupropion Other (See Comments)    Makes her feel devoid of emotions      ROS: See pertinent positives and negatives per HPI.   EXAM:  VITALS per patient if applicable: Wt 240 lb (108.9 kg) Comment: pt reported  BMI 42.51 kg/m    GENERAL: alert, oriented, appears well and in no acute distress  HEENT: atraumatic, conjunctiva clear, no obvious abnormalities on inspection of external nose and ears  NECK: normal movements of the head and neck  LUNGS: on inspection no signs of respiratory distress, breathing rate appears normal, no obvious gross SOB, gasping or wheezing, no conversational dyspnea  CV: no obvious cyanosis  PSYCH/NEURO: pleasant and cooperative, no obvious depression or anxiety, speech and thought processing grossly intact   ASSESSMENT AND PLAN:  1. Anxiety - overall controlled but recent excarnation mainly d/t work stress - database reviewed and appropriate Refill: - LORazepam (ATIVAN) 1 MG tablet; 1/2 to 1 tab po daily PRN anxiety  Dispense: 30 tablet;  Refill: 1 - f/u PRN  2. Urticaria - resolving - on prednisone taper Rx'd by telehealth provider on 03/26/20 - pt also taking benadryl 50mg  qHS - recommended adding zyrtec BID x 2 wks then decreased to daily then stopping - f/u PRN   I discussed the assessment and treatment plan with the patient. The patient was provided an opportunity to ask questions and all were answered. The patient agreed with  the plan and demonstrated an understanding of the instructions.   The patient was advised to call back or seek an in-person evaluation if the symptoms worsen or if the condition fails to improve as anticipated.   Letta Median, DO

## 2020-04-03 IMAGING — MG DIGITAL SCREENING BILATERAL MAMMOGRAM WITH TOMO AND CAD
8 series · 8 of 24 positions shown · non-contrast
Comparison: Previous exam(s).

CLINICAL DATA: Screening.

EXAM:
DIGITAL SCREENING BILATERAL MAMMOGRAM WITH TOMO AND CAD

[R MLO synth-2D]
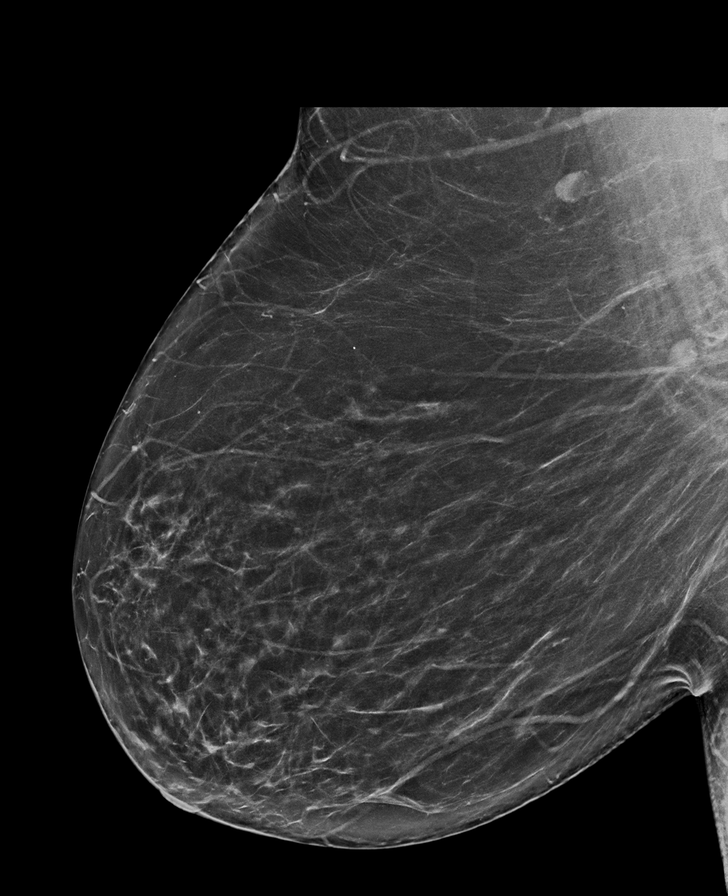

[L MLO synth-2D]
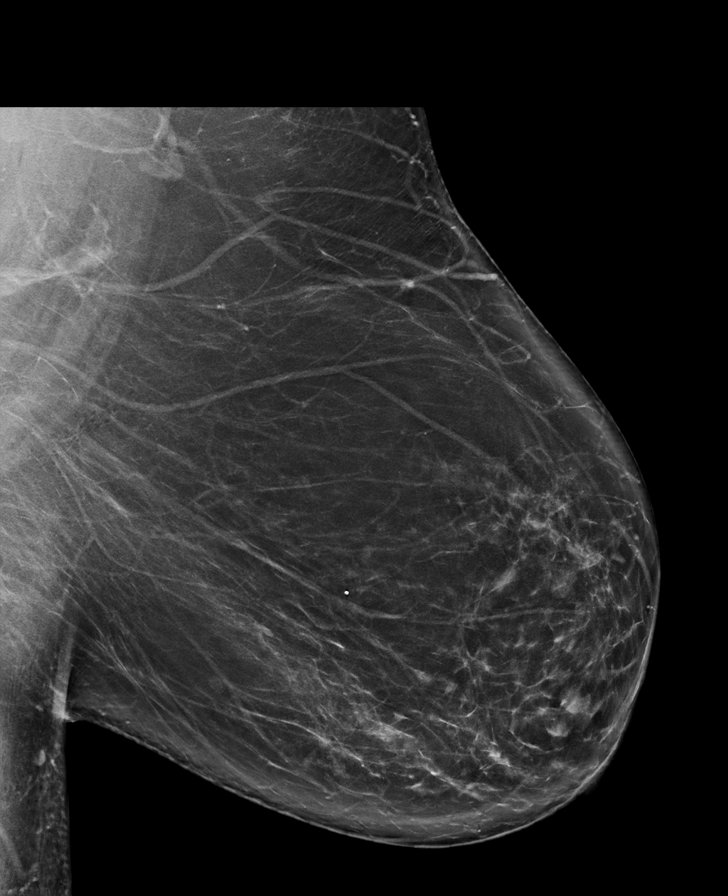

[L CC synth-2D]
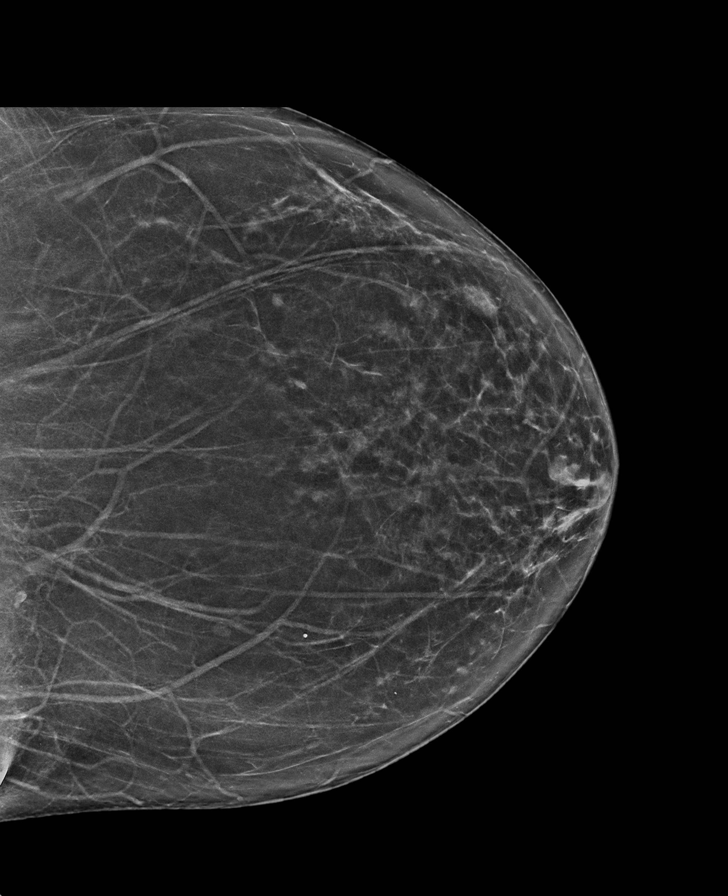

[R CC synth-2D]
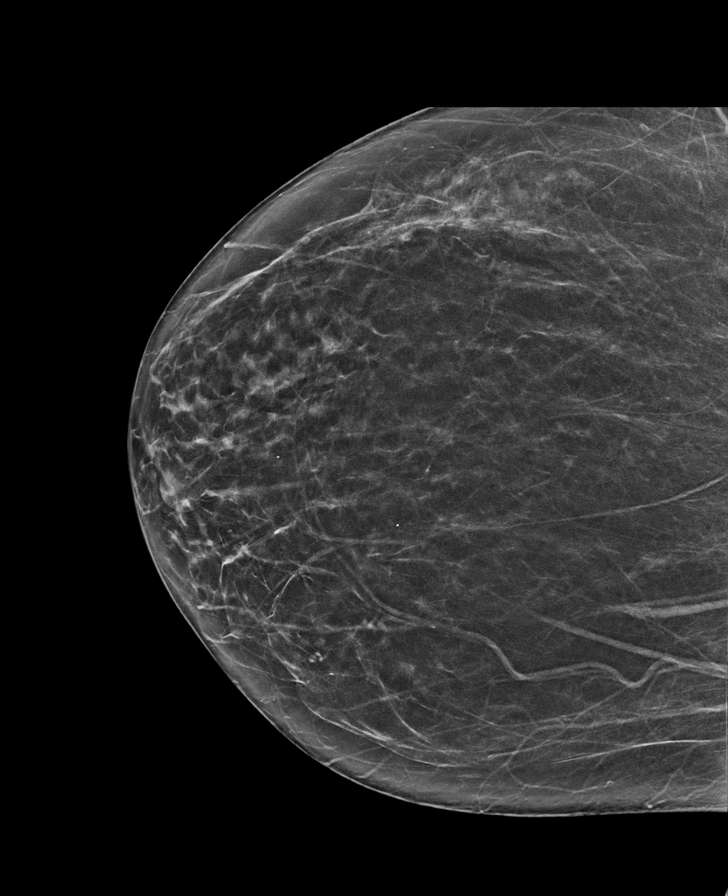

[L MLO tomo · tomo slice 47/92.0]
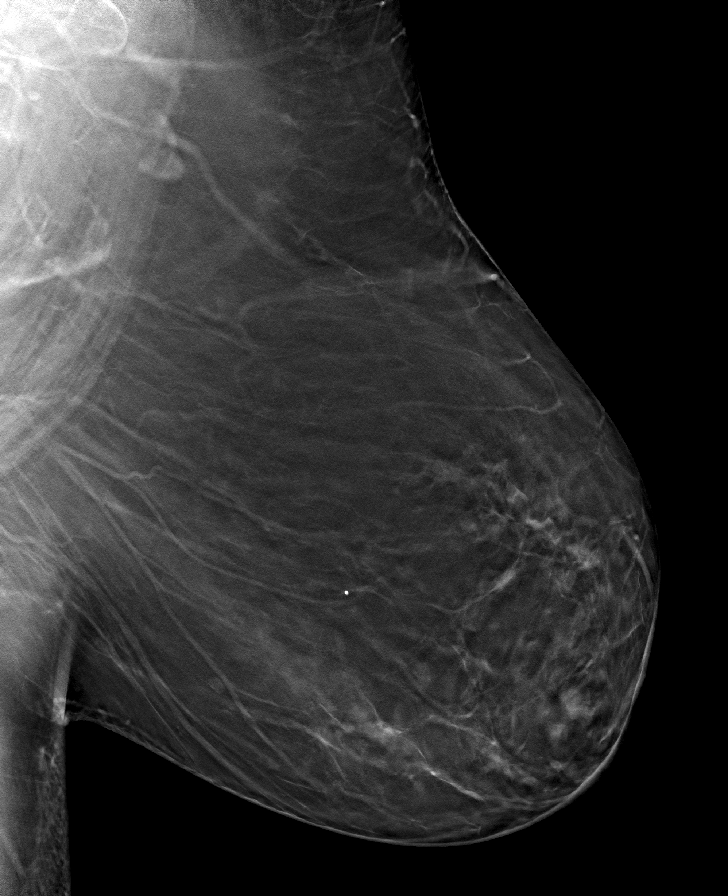

[R CC tomo · tomo slice 35/70.0]
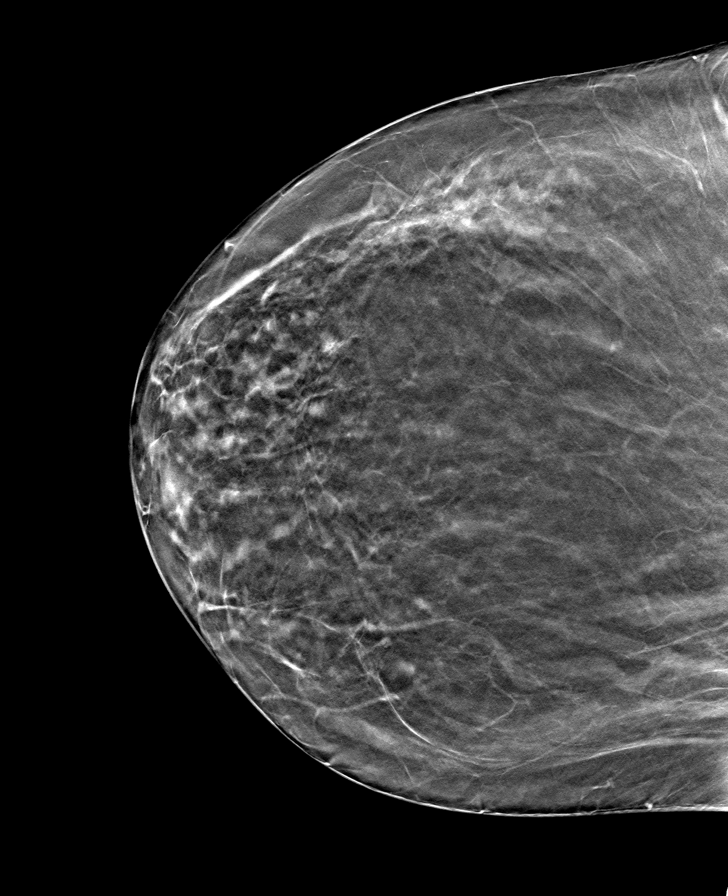

[R MLO tomo · tomo slice 43/85.0]
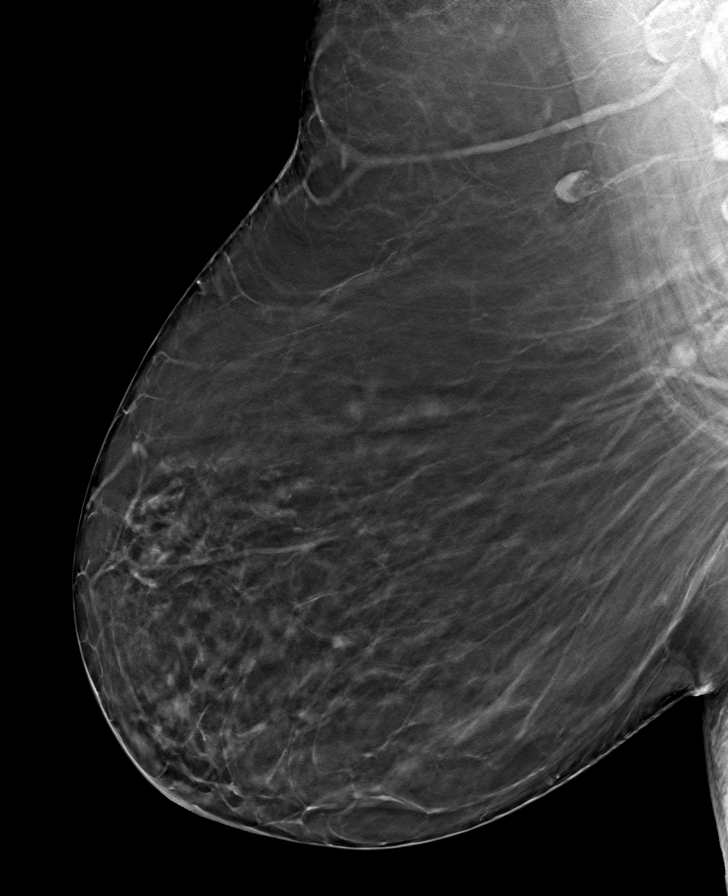

[L CC tomo · tomo slice 36/71.0]
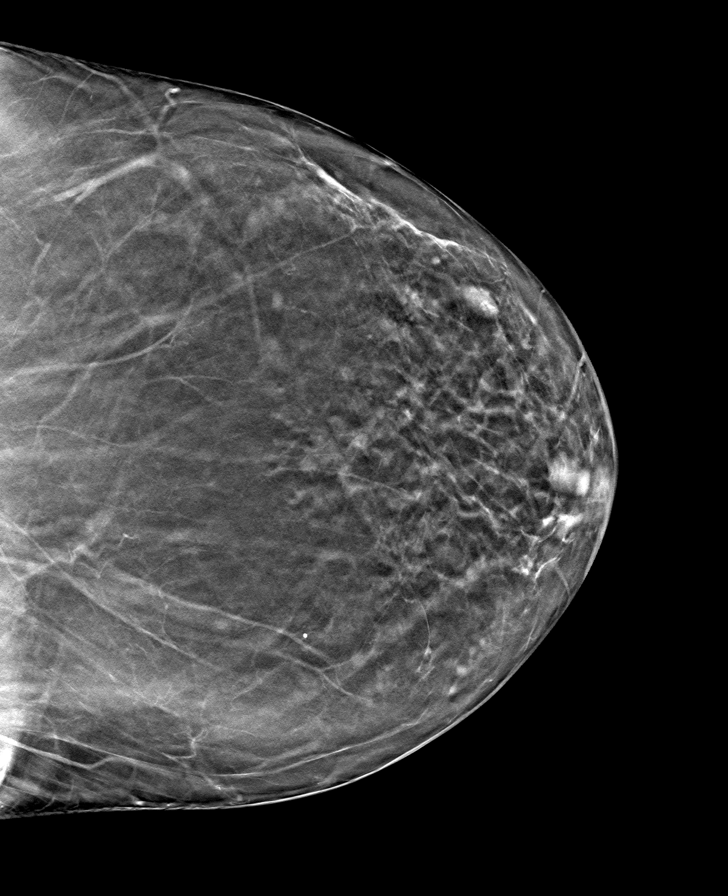

[8 of 24 positions shown; findings below may reference images not displayed]

ACR Breast Density Category b: There are scattered areas of
fibroglandular density.
FINDINGS: There are no findings suspicious for malignancy. Images were
processed with CAD.
IMPRESSION: No mammographic evidence of malignancy. A result letter of this
screening mammogram will be mailed directly to the patient.

RECOMMENDATION:
Screening mammogram in one year. (Code:[id number])

BI-RADS CATEGORY  1: Negative.

## 2020-04-25 ENCOUNTER — Telehealth (INDEPENDENT_AMBULATORY_CARE_PROVIDER_SITE_OTHER): Payer: 59 | Admitting: Family Medicine

## 2020-04-25 ENCOUNTER — Other Ambulatory Visit: Payer: Self-pay | Admitting: Family Medicine

## 2020-04-25 ENCOUNTER — Encounter: Payer: Self-pay | Admitting: Family Medicine

## 2020-04-25 VITALS — Ht 63.0 in | Wt 243.0 lb

## 2020-04-25 DIAGNOSIS — J019 Acute sinusitis, unspecified: Secondary | ICD-10-CM

## 2020-04-25 MED ORDER — HYDROCOD POLST-CPM POLST ER 10-8 MG/5ML PO SUER: 5.0000 mL | Freq: Two times a day (BID) | ORAL | 0 refills | Status: DC | PRN

## 2020-04-25 MED ORDER — PREDNISONE 20 MG PO TABS: 40.0000 mg | ORAL_TABLET | Freq: Every day | ORAL | 0 refills | Status: AC

## 2020-04-25 NOTE — Progress Notes (Signed)
Virtual Visit via Video Note  I connected with Alejandra Miller on 04/25/20 at 10:00 AM EST by a video enabled telemedicine application and verified that I am speaking with the correct person using two identifiers. Location patient: home Location provider: work or home office Persons participating in the virtual visit: patient, provider  I discussed the limitations of evaluation and management by telemedicine and the availability of in person appointments. The patient expressed understanding and agreed to proceed.  Interactive audio and video telecommunications were attempted between myself and the patient, however failed, due to the patient having technical difficulties. We continued and completed the visit with audio only.   Chief Complaint  Patient presents with  . Acute Visit    C/o having sinus congestion, cough with clear plegm, HA, body aches, loss of smell x 1 week.  She has been taking Coricidin HBP, Mucinex and throat lounges with little relief.     HPI: Alejandra Miller is a 60 y.o. female who complains of 4 day h/o body aches, headache, sinus and nasal congestion, Lt facial tenderness (yesterday and has resolved), decreased sense of smell. Cough is worse at night when pt is laying down. No fever, chills. No GI symptoms.  Pt is taking Mucinex and Coricidin HPB. She has started using nasacort for the past 2 days. She continue to take allegra daily. She has not been tested for covid  Past Medical History:  Diagnosis Date  . Anxiety    NO MEDS  . Arthritis   . Back pain   . Cervical cancer screening 07/20/2012   Menarche at 13, regular til recently No h/o abnormal paps, last pap 2-3 years ago G0P0  MGM today, no h/o abnormal MGMs Spotting now monthly and scant H/o uterine polyps  . Condyloma acuminata 2020  . Depression    counseling in 2010 for 2 months on medication  . Fibrocystic breast 1988  . Fibroids 2014  . Fracture of right fibula 05/19/2015   October 2016  . GERD  (gastroesophageal reflux disease) 2005   treated with omeprazole  . High cholesterol 2002  . History of sinusitis    once/twice per year  . Hyperglycemia 05/19/2015  . Hypertension 2002  . Joint pain   . MRSA (methicillin resistant staph aureus) culture positive 2007  . Muscular pain   . Obesity   . OSA (obstructive sleep apnea)    +sleep study 08/2010., DOES NOT USE CPAP, USES MOSES MOUTH PIECE  . Osteoarthritis    KNEES, HANDS, HIPS  . Osteoporosis   . Plantar fasciitis of left foot 03/2014  . Seasonal allergies    affected in the spring  . Sleep apnea    mouthpiece "[facility]"  . Uterine polyp   . 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 Vitamin D deficiency   . Wrist fracture 07/2012   right    Past Surgical History:  Procedure Laterality Date  . DILATATION & CURETTAGE/HYSTEROSCOPY WITH MYOSURE N/A 07/30/2014   Procedure: DILATATION & CURETTAGE/HYSTEROSCOPY WITH MYOSURE;  Surgeon: Nunzio Cobbs, MD;  Location: [facility];  Service: Gynecology;  Laterality: N/A;  extra time for BMI 49.  Marland Kitchen DILATATION & CURRETTAGE/HYSTEROSCOPY WITH RESECTOCOPE N/A 11/24/2012   Procedure: DILATATION & CURETTAGE/HYSTEROSCOPY WITH RESECTOCOPE;  Surgeon: Arloa Koh, MD;  Location: [facility];  Service: Gynecology;  Laterality: N/A;  . ESSURE TUBAL LIGATION Left   . TONSILLECTOMY  1969  . WISDOM TOOTH EXTRACTION  1979  .  WRIST FRACTURE SURGERY Right 07/2012    Family History  Problem Relation Age of Onset  . Osteoarthritis Mother   . Hyperlipidemia Mother   . Depression Mother   . Alzheimer's disease Mother   . Hypertension Mother   . Sleep apnea Mother   . Obesity Mother   . Liver cancer Maternal Grandmother        mets to breast  . Breast cancer Maternal Grandmother        liver mets to breast  . Cancer Maternal Grandmother        with mets  . Hyperlipidemia Father   . Coronary artery disease Father   . Hypertension Father   . Stroke Father         multiple  . Diabetes Father   . Obesity Father   . Sleep apnea Father   . Colon cancer Neg Hx     Social History   Tobacco Use  . Smoking status: Former Smoker    Packs/day: 0.50    Years: 6.00    Pack years: 3.00    Types: Cigarettes    Quit date: 03/01/1989    Years since quitting: 31.1  . Smokeless tobacco: Never Used  . Tobacco comment: Quit in 1991- started in 1983 1/2ppd  Vaping Use  . Vaping Use: Never used  Substance Use Topics  . Alcohol use: Yes    Alcohol/week: 0.0 standard drinks    Comment: 2 drinks/month  . Drug use: No     Current Outpatient Medications:  .  busPIRone (BUSPAR) 7.5 MG tablet, Take 1 tablet (7.5 mg total) by mouth 2 (two) times daily., Disp: 180 tablet, Rfl: 3 .  CALCIUM PO, Take 1 tablet by mouth 2 (two) times daily. , Disp: , Rfl:  .  Coenzyme Q10 (CO Q 10 PO), Take by mouth daily., Disp: , Rfl:  .  cyclobenzaprine (FLEXERIL) 10 MG tablet, Take 1 tablet (10 mg total) by mouth daily as needed., Disp: 90 tablet, Rfl: 3 .  ibuprofen (ADVIL) 800 MG tablet, TAKE 1 TABLET BY MOUTH THREE TIMES DAILY, Disp: 60 tablet, Rfl: 0 .  levonorgestrel (MIRENA) 20 MCG/24HR IUD, 1 each by Intrauterine route once., Disp: , Rfl:  .  lisinopril-hydrochlorothiazide (ZESTORETIC) 20-25 MG tablet, Take 1 tablet by mouth daily., Disp: 90 tablet, Rfl: 3 .  LORazepam (ATIVAN) 1 MG tablet, 1/2 to 1 tab po daily PRN anxiety, Disp: 30 tablet, Rfl: 1 .  MAGNESIUM PO, Take 500 mg by mouth., Disp: , Rfl:  .  Melatonin 3 MG TABS, Take 1 tablet by mouth at bedtime., Disp: , Rfl:  .  montelukast (SINGULAIR) 10 MG tablet, Take 1 tablet (10 mg total) by mouth at bedtime., Disp: 90 tablet, Rfl: 3 .  Multiple Vitamin (MULTIVITAMIN) tablet, Take 1 tablet by mouth daily., Disp: , Rfl:  .  mupirocin cream (BACTROBAN) 2 %, Apply 1 application topically as needed., Disp: 15 g, Rfl: 1 .  Omega-3 Fatty Acids (FISH OIL) 1000 MG CAPS, Take 1,000 mg by mouth daily., Disp: , Rfl:  .  omeprazole  (PRILOSEC) 20 MG capsule, Take 20 mg by mouth daily as needed., Disp: , Rfl:  .  oxybutynin (DITROPAN XL) 15 MG 24 hr tablet, Take one tablet (15 mg) by mouth daily., Disp: 90 tablet, Rfl: 2 .  POTASSIUM PO, Take 99 mg by mouth., Disp: , Rfl:  .  Probiotic Product (PROBIOTIC PO), Take by mouth., Disp: , Rfl:  .  sodium chloride (OCEAN) 0.65 %  SOLN nasal spray, Place 1 spray into both nostrils as needed for congestion., Disp: 15 mL, Rfl: 0 .  triamcinolone (NASACORT) 55 MCG/ACT AERO nasal inhaler, Place 2 sprays into the nose daily., Disp: , Rfl:  .  TURMERIC PO, Take by mouth., Disp: , Rfl:  .  Vilazodone HCl (VIIBRYD) 40 MG TABS, Take 1 tablet (40 mg total) by mouth daily., Disp: 90 tablet, Rfl: 3 .  Vitamin D, Ergocalciferol, (DRISDOL) 1.25 MG (50000 UT) CAPS capsule, Take 1 capsule (50,000 Units total) by mouth every 7 (seven) days., Disp: 12 capsule, Rfl: 3 .  benzonatate (TESSALON) 100 MG capsule, Take 1 capsule (100 mg total) by mouth 2 (two) times daily as needed for cough. (Patient not taking: Reported on 04/25/2020), Disp: 60 capsule, Rfl: 1 .  fluticasone (FLONASE) 50 MCG/ACT nasal spray, Place into both nostrils daily. (Patient not taking: Reported on 04/25/2020), Disp: , Rfl:  .  ibuprofen (ADVIL) 800 MG tablet, TAKE 1 TABLET BY MOUTH THREE TIMES DAILY (Patient not taking: Reported on 04/25/2020), Disp: 60 tablet, Rfl: 0 .  predniSONE (DELTASONE) 20 MG tablet, Take 20 mg by mouth 2 (two) times daily. (Patient not taking: Reported on 04/25/2020), Disp: , Rfl:   Allergies  Allergen Reactions  . Bupropion Other (See Comments)    Makes her feel devoid of emotions      ROS: See pertinent positives and negatives per HPI.   EXAM:  VITALS per patient if applicable: Ht 5\' 3"  (1.6 m)   Wt 243 lb (110.2 kg) Comment: pt reported  BMI 43.05 kg/m    GENERAL: alert, oriented, no acute distress, sounds congested  HEENT: atraumatic, conjunctiva clear, no obvious abnormalities on inspection  of external nose and ears  NECK: normal movements of the head and neck  LUNGS: no obvious SOB, gasping or wheezing, no conversational dyspnea  PSYCH/NEURO: pleasant and cooperative, speech and thought processing grossly intact   ASSESSMENT AND PLAN:   1. Acute non-recurrent sinusitis, unspecified location - symptoms x 4 days - add nasal saline spray 3x/day - cont with nasacort, allegra - 4-5 days of mucinex D 1 tab BID Rx: - chlorpheniramine-HYDROcodone (TUSSIONEX PENNKINETIC ER) 10-8 MG/5ML SUER; Take 5 mLs by mouth every 12 (twelve) hours as needed for cough.  Dispense: 140 mL; Refill: 0 - predniSONE (DELTASONE) 20 MG tablet; Take 2 tablets (40 mg total) by mouth daily with breakfast for 5 days.  Dispense: 10 tablet; Refill: 0 - f/u if symptoms worsen or do not improve in 5-7 days    I discussed the assessment and treatment plan with the patient. The patient was provided an opportunity to ask questions and all were answered. The patient agreed with the plan and demonstrated an understanding of the instructions.   The patient was advised to call back or seek an in-person evaluation if the symptoms worsen or if the condition fails to improve as anticipated.  I spent 14 min with the patient today.  Letta Median, DO

## 2020-04-25 NOTE — Patient Instructions (Addendum)
Nasal salines spray 3x/day Cont with nasacort and allegra Cont with mucinex or try mucinex D 1 tab twice per day x 5 days Rx cough syrup at bedtime and then as needed Prednisone 40mg  x 5 days

## 2020-06-05 ENCOUNTER — Other Ambulatory Visit: Payer: Self-pay

## 2020-06-05 ENCOUNTER — Telehealth: Payer: Self-pay | Admitting: Family Medicine

## 2020-06-05 NOTE — Telephone Encounter (Signed)
Caller Name: pt, Evangelina Call back phone #: [phone]  Reason for Call: Pt called in noting left shoulder blade discomfort/down back for 6 wks. She said in the past 2 days it has gotten worse and now radiating down left arm into left hand causing numbness/tingling. Transferred to [facility] with [facility].

## 2020-06-05 NOTE — Telephone Encounter (Signed)
If patient calls back, please schedule her for an office visit to evaluated shoulder.  Thank you

## 2020-06-05 NOTE — Telephone Encounter (Signed)
Pt is scheduled for tomorrow 4/8

## 2020-06-06 ENCOUNTER — Ambulatory Visit (INDEPENDENT_AMBULATORY_CARE_PROVIDER_SITE_OTHER): Payer: PRIVATE HEALTH INSURANCE

## 2020-06-06 ENCOUNTER — Ambulatory Visit (INDEPENDENT_AMBULATORY_CARE_PROVIDER_SITE_OTHER): Payer: PRIVATE HEALTH INSURANCE | Admitting: Family Medicine

## 2020-06-06 ENCOUNTER — Encounter: Payer: Self-pay | Admitting: Family Medicine

## 2020-06-06 VITALS — BP 140/90 | HR 89 | Temp 97.6°F | Ht 63.0 in | Wt 247.4 lb

## 2020-06-06 DIAGNOSIS — G8929 Other chronic pain: Secondary | ICD-10-CM | POA: Diagnosis not present

## 2020-06-06 DIAGNOSIS — M5410 Radiculopathy, site unspecified: Secondary | ICD-10-CM

## 2020-06-06 DIAGNOSIS — M25512 Pain in left shoulder: Secondary | ICD-10-CM

## 2020-06-06 MED ORDER — PREDNISONE 20 MG PO TABS: ORAL_TABLET | ORAL | 0 refills | Status: DC

## 2020-06-06 MED ORDER — IBUPROFEN 800 MG PO TABS: 800.0000 mg | ORAL_TABLET | Freq: Three times a day (TID) | ORAL | 0 refills | Status: DC | PRN

## 2020-06-06 NOTE — Progress Notes (Signed)
Alejandra Miller is a 60 y.o. female  Chief Complaint  Patient presents with  . Shoulder Pain    Pt c/o lt shoulder blade pain,starting lt side of her back and goes up to lt shoulder blade x 2 months. Pt explains that it tingles and goes down to pinky finger/ring finger.  Also sometime the pain is sharp and dull.  Pt has had 1 year 1/2 therapy for the pain.    HPI: Alejandra Miller is a 60 y.o. female who complains of 65mo h/o Lt shoulder and upper back pain/discomfort with intermittent radiation of pain into Lt arm and hand. In the past few weeks, symptoms have gotten worse. Symptoms will cause pt to have trouble falling asleep and at times wake her up from sleep. There are times with pt is asymptomatic, other times when just "mild ache", and other times with sharp pain or tingling down arm.  She has taken ibuprofen, muscle relaxant and used ice/heat No injury or trauma. No LUE weakness.  No imaging or PT. She had similar but more mild symptoms about 1-1.5 years ago and then improved with chiropractor, massage, acupuncture.   Past Medical History:  Diagnosis Date  . Anxiety    NO MEDS  . Arthritis   . Back pain   . Cervical cancer screening 07/20/2012   Menarche at 13, regular til recently No h/o abnormal paps, last pap 2-3 years ago G0P0  MGM today, no h/o abnormal MGMs Spotting now monthly and scant H/o uterine polyps  . Condyloma acuminata 2020  . Depression    counseling in 2010 for 2 months on medication  . Fibrocystic breast 1988  . Fibroids 2014  . Fracture of right fibula 05/19/2015   October 2016  . GERD (gastroesophageal reflux disease) 2005   treated with omeprazole  . High cholesterol 2002  . History of sinusitis    once/twice per year  . Hyperglycemia 05/19/2015  . Hypertension 2002  . Joint pain   . MRSA (methicillin resistant staph aureus) culture positive 2007  . Muscular pain   . Obesity   . OSA (obstructive sleep apnea)    +sleep study 08/2010., DOES NOT USE CPAP,  USES MOSES MOUTH PIECE  . Osteoarthritis    KNEES, HANDS, HIPS  . Osteoporosis   . Plantar fasciitis of left foot 03/2014  . Seasonal allergies    affected in the spring  . Sleep apnea    mouthpiece "[facility]"  . Uterine polyp   . 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 Vitamin D deficiency   . Wrist fracture 07/2012   right    Past Surgical History:  Procedure Laterality Date  . DILATATION & CURETTAGE/HYSTEROSCOPY WITH MYOSURE N/A 07/30/2014   Procedure: DILATATION & CURETTAGE/HYSTEROSCOPY WITH MYOSURE;  Surgeon: Nunzio Cobbs, MD;  Location: [facility];  Service: Gynecology;  Laterality: N/A;  extra time for BMI 49.  Marland Kitchen DILATATION & CURRETTAGE/HYSTEROSCOPY WITH RESECTOCOPE N/A 11/24/2012   Procedure: DILATATION & CURETTAGE/HYSTEROSCOPY WITH RESECTOCOPE;  Surgeon: Arloa Koh, MD;  Location: [facility];  Service: Gynecology;  Laterality: N/A;  . ESSURE TUBAL LIGATION Left   . TONSILLECTOMY  1969  . WISDOM TOOTH EXTRACTION  1979  . WRIST FRACTURE SURGERY Right 07/2012    Social History   Socioeconomic History  . Marital status: Married    Spouse name: Sharlett Lienemann  . Number of children: Not on file  . Years of education: Not  on file  . Highest education level: Not on file  Occupational History  . Not on file  Tobacco Use  . Smoking status: Former Smoker    Packs/day: 0.50    Years: 6.00    Pack years: 3.00    Types: Cigarettes    Quit date: 03/01/1989    Years since quitting: 31.2  . Smokeless tobacco: Never Used  . Tobacco comment: Quit in 1991- started in 1983 1/2ppd  Vaping Use  . Vaping Use: Never used  Substance and Sexual Activity  . Alcohol use: Yes    Alcohol/week: 0.0 standard drinks    Comment: 2 drinks/month  . Drug use: No  . Sexual activity: Not Currently    Partners: Male    Birth control/protection: Other-see comments, I.U.D.    Comment: vasectomy/Mirena IUD inserted 03-04-16  Other Topics Concern  . Not on file  Social  History Narrative  . Not on file   Social Determinants of Health   Financial Resource Strain: Not on file  Food Insecurity: Not on file  Transportation Needs: Not on file  Physical Activity: Not on file  Stress: Not on file  Social Connections: Not on file  Intimate Partner Violence: Not on file    Family History  Problem Relation Age of Onset  . Osteoarthritis Mother   . Hyperlipidemia Mother   . Depression Mother   . Alzheimer's disease Mother   . Hypertension Mother   . Sleep apnea Mother   . Obesity Mother   . Liver cancer Maternal Grandmother        mets to breast  . Breast cancer Maternal Grandmother        liver mets to breast  . Cancer Maternal Grandmother        with mets  . Hyperlipidemia Father   . Coronary artery disease Father   . Hypertension Father   . Stroke Father        multiple  . Diabetes Father   . Obesity Father   . Sleep apnea Father   . Colon cancer Neg Hx      Immunization History  Administered Date(s) Administered  . Influenza Split 12/06/2011  . Influenza Whole 01/02/2013  . Influenza,inj,Quad PF,6+ Mos 11/02/2013, 11/23/2016, 12/10/2017, 10/26/2018  . Influenza-Unspecified 12/04/2014, 03/03/2016, 11/23/2016, 12/10/2017  . PFIZER(Purple Top)SARS-COV-2 Vaccination 10/14/2019, 11/09/2019  . Pneumococcal Conjugate-13 01/22/2013  . Tdap 03/05/2002, 08/02/2012, 01/22/2013    Outpatient Encounter Medications as of 06/06/2020  Medication Sig Note  . busPIRone (BUSPAR) 7.5 MG tablet Take 1 tablet (7.5 mg total) by mouth 2 (two) times daily.   Marland Kitchen CALCIUM PO Take 1 tablet by mouth 2 (two) times daily.    . Coenzyme Q10 (CO Q 10 PO) Take by mouth daily.   . cyclobenzaprine (FLEXERIL) 10 MG tablet Take 1 tablet (10 mg total) by mouth daily as needed.   Marland Kitchen ibuprofen (ADVIL) 800 MG tablet TAKE 1 TABLET BY MOUTH THREE TIMES DAILY   . levonorgestrel (MIRENA) 20 MCG/24HR IUD 1 each by Intrauterine route once.   Marland Kitchen lisinopril-hydrochlorothiazide  (ZESTORETIC) 20-25 MG tablet Take 1 tablet by mouth daily.   Marland Kitchen LORazepam (ATIVAN) 1 MG tablet 1/2 to 1 tab po daily PRN anxiety   . MAGNESIUM PO Take 500 mg by mouth.   . Melatonin 3 MG TABS Take 1 tablet by mouth at bedtime.   . montelukast (SINGULAIR) 10 MG tablet Take 1 tablet (10 mg total) by mouth at bedtime.   . Multiple Vitamin (MULTIVITAMIN)  tablet Take 1 tablet by mouth daily.   . mupirocin cream (BACTROBAN) 2 % Apply 1 application topically as needed.   . Omega-3 Fatty Acids (FISH OIL) 1000 MG CAPS Take 1,000 mg by mouth daily.   Marland Kitchen omeprazole (PRILOSEC) 20 MG capsule Take 20 mg by mouth daily as needed.   Marland Kitchen oxybutynin (DITROPAN XL) 15 MG 24 hr tablet Take one tablet (15 mg) by mouth daily.   Marland Kitchen POTASSIUM PO Take 99 mg by mouth.   . Probiotic Product (PROBIOTIC PO) Take by mouth.   . sodium chloride (OCEAN) 0.65 % SOLN nasal spray Place 1 spray into both nostrils as needed for congestion.   . triamcinolone (NASACORT) 55 MCG/ACT AERO nasal inhaler Place 2 sprays into the nose daily. 02/27/2020: prn  . TURMERIC PO Take by mouth.   . Vilazodone HCl (VIIBRYD) 40 MG TABS Take 1 tablet (40 mg total) by mouth daily.   . Vitamin D, Ergocalciferol, (DRISDOL) 1.25 MG (50000 UT) CAPS capsule Take 1 capsule (50,000 Units total) by mouth every 7 (seven) days.   Marland Kitchen guaiFENesin-codeine (ROBITUSSIN AC) 100-10 MG/5ML syrup Take 5 mLs by mouth 3 (three) times daily as needed for cough. (Patient not taking: Reported on 06/06/2020)   . ibuprofen (ADVIL) 800 MG tablet TAKE 1 TABLET BY MOUTH THREE TIMES DAILY (Patient not taking: Reported on 04/25/2020)   . [DISCONTINUED] benzonatate (TESSALON) 100 MG capsule Take 1 capsule (100 mg total) by mouth 2 (two) times daily as needed for cough. (Patient not taking: Reported on 04/25/2020)   . [DISCONTINUED] fluticasone (FLONASE) 50 MCG/ACT nasal spray Place into both nostrils daily. (Patient not taking: Reported on 04/25/2020)    No facility-administered encounter  medications on file as of 06/06/2020.     ROS: Pertinent positives and negatives noted in HPI. Remainder of ROS non-contributory   Allergies  Allergen Reactions  . Bupropion Other (See Comments)    Makes her feel devoid of emotions    BP 140/90 (BP Location: Right Arm, Patient Position: Sitting, Cuff Size: Large)   Pulse 89   Temp 97.6 F (36.4 C) (Temporal)   Ht 5\' 3"  (1.6 m)   Wt 247 lb 6.4 oz (112.2 kg)   SpO2 98%   BMI 43.82 kg/m   Wt Readings from Last 3 Encounters:  06/06/20 247 lb 6.4 oz (112.2 kg)  04/25/20 243 lb (110.2 kg)  03/28/20 240 lb (108.9 kg)   Temp Readings from Last 3 Encounters:  06/06/20 97.6 F (36.4 C) (Temporal)  02/27/20 97.7 F (36.5 C) (Temporal)  06/15/19 (!) 97.3 F (36.3 C) (Temporal)   BP Readings from Last 3 Encounters:  06/06/20 140/90  02/27/20 136/80  02/15/20 118/80   Pulse Readings from Last 3 Encounters:  06/06/20 89  02/27/20 (!) 102  02/15/20 76      Physical Exam Constitutional:      General: She is not in acute distress.    Appearance: She is obese. She is not ill-appearing.  Musculoskeletal:     Left shoulder: No tenderness or crepitus. Decreased range of motion. Normal strength. Normal pulse.     Cervical back: Normal range of motion. No pain with movement, spinous process tenderness or muscular tenderness. Normal range of motion.  Neurological:     General: No focal deficit present.     Mental Status: She is alert and oriented to person, place, and time.     Sensory: No sensory deficit.     Motor: No weakness.  Psychiatric:  Mood and Affect: Mood normal.        Behavior: Behavior normal.      A/P:  1. Chronic left shoulder pain 2. Radiculopathy affecting upper extremity - symptoms x 2 mo, increasing - DG Cervical Spine Complete - DG Shoulder Left Rx: - predniSONE (DELTASONE) 20 MG tablet; 3 tabs po x 4 days, 2 tabs po x 4 days, then 1 tab daily x 4 days, 1/2 tab po daily x 4 days  Dispense: 24  tablet; Refill: 0 - if normal xray and minimal improvement with steroid taper, will refer to ortho for eval and possible additional imaging Discussed plan and reviewed medications with patient, including risks, benefits, and potential side effects. Pt expressed understand. All questions answered.   I spent 30 min with the patient today obtaining HPI, discussing symptoms and possible etiologies, evaluation, treatment options.    This visit occurred during the SARS-CoV-2 public health emergency.  Safety protocols were in place, including screening questions prior to the visit, additional usage of staff PPE, and extensive cleaning of exam room while observing appropriate contact time as indicated for disinfecting solutions.

## 2020-06-17 ENCOUNTER — Encounter: Payer: Self-pay | Admitting: Family Medicine

## 2020-06-17 DIAGNOSIS — M25512 Pain in left shoulder: Secondary | ICD-10-CM

## 2020-06-17 DIAGNOSIS — M5410 Radiculopathy, site unspecified: Secondary | ICD-10-CM

## 2020-06-17 DIAGNOSIS — G8929 Other chronic pain: Secondary | ICD-10-CM

## 2020-06-17 NOTE — Telephone Encounter (Signed)
Please see message and advise.  Thank you. ° °

## 2020-06-25 ENCOUNTER — Ambulatory Visit: Payer: PRIVATE HEALTH INSURANCE | Admitting: Orthopaedic Surgery

## 2020-07-09 ENCOUNTER — Other Ambulatory Visit: Payer: Self-pay

## 2020-07-09 ENCOUNTER — Encounter: Payer: Self-pay | Admitting: Orthopaedic Surgery

## 2020-07-09 ENCOUNTER — Ambulatory Visit (INDEPENDENT_AMBULATORY_CARE_PROVIDER_SITE_OTHER): Payer: PRIVATE HEALTH INSURANCE | Admitting: Orthopaedic Surgery

## 2020-07-09 DIAGNOSIS — M5412 Radiculopathy, cervical region: Secondary | ICD-10-CM | POA: Diagnosis not present

## 2020-07-09 DIAGNOSIS — M542 Cervicalgia: Secondary | ICD-10-CM

## 2020-07-09 MED ORDER — GABAPENTIN 300 MG PO CAPS: 300.0000 mg | ORAL_CAPSULE | Freq: Every day | ORAL | 1 refills | Status: DC

## 2020-07-09 MED ORDER — MELOXICAM 15 MG PO TABS: 15.0000 mg | ORAL_TABLET | Freq: Every day | ORAL | 1 refills | Status: DC

## 2020-07-09 NOTE — Progress Notes (Signed)
Office Visit Note   Patient: Alejandra Miller           Date of Birth: 1961-01-16           MRN: [id number] Visit Date: 07/09/2020              Requested by: Ronnald Nian, DO [place],  [place] [address] PCP: Ronnald Nian, DO   Assessment & Plan: Visit Diagnoses:  1. Cervicalgia   2. Left cervical radiculopathy     Plan: Given her significant left-sided radicular symptoms combined with her x-ray findings of severe degenerative disease at C5-C6, a MRI is warranted of her lumbar spine to assess for nerve compression.  I am going to put her on meloxicam as an anti-inflammatory insulin gabapentin to take as well.  Since she has tried failed all forms of conservative treatment at this point this MRI is certainly necessary to come up with a treatment plan to address the left upper extremity radicular symptoms.  She agrees with this treatment plan.  She will call us for follow-up appointment when she has a date for her MRI.  Follow-Up Instructions: No follow-ups on file.   Orders:  No orders of the defined types were placed in this encounter.  Meds ordered this encounter  Medications  . gabapentin (NEURONTIN) 300 MG capsule    Sig: Take 1 capsule (300 mg total) by mouth at bedtime.    Dispense:  60 capsule    Refill:  1  . meloxicam (MOBIC) 15 MG tablet    Sig: Take 1 tablet (15 mg total) by mouth daily.    Dispense:  30 tablet    Refill:  1      Procedures: No procedures performed   Clinical Data: No additional findings.   Subjective: Chief Complaint  Patient presents with  . Left Shoulder - Pain  The patient is a very pleasant 60 year old female who comes in for evaluation treatment of left upper extremity radicular symptoms with numbness and tingling going down into the ulnar nerve distribution as where she points to.  This is been going on for at least a year now.  She has been through chiropractic treatment and has had a steroid taper  which did help but only for short period time.  She is also taking over-the-counter anti-inflammatories.  She has tried and failed all forms conservative treatment at this standpoint.  She has x-rays on the canopy system of her cervical spine and her shoulder for me to evaluate.  She denies any shoulder discomfort or weakness in the shoulder itself.  She is not a diabetic.  HPI  Review of Systems There is currently listed no headache, chest pain, shortness of breath, fever, chills, nausea, vomiting  Objective: Vital Signs: There were no vitals taken for this visit.  Physical Exam She is alert and orient x3 and in no acute distress Ortho Exam Examination of her cervical spine shows full range of motion but definitely positive Spurling sign to the left side and not the right.  There is some weakness with her grip strength on the left side and some slight triceps weakness.  The right side is normal.  There is a negative Tinel's at the cubital tunnel on the left side. Specialty Comments:  No specialty comments available.  Imaging: No results found. X-rays of the cervical spine shows severe degenerative disc disease and narrowing between C5 and C6.  The remainder of her cervical spine appears normal  in terms of the disc levels and heights.  PMFS History: Patient Active Problem List   Diagnosis Date Noted  . Insulin resistance 04/10/2018  . Class 3 severe obesity with serious comorbidity and body mass index (BMI) of 40.0 to 44.9 in adult ([place]) 04/10/2018  . Osteoarthritis   . Fracture of right fibula 05/19/2015  . h/o Hyperglycemia 05/19/2015  . Bursitis of shoulder 11/05/2013  . Cervical cancer screening 07/20/2012  . BMI 40.0-44.9, adult ([place]) 02/12/2012  . Leg swelling 12/08/2011  . Annual physical exam 07/28/2011  . Menstrual irregularity 12/15/2010  . Vitamin D deficiency 12/15/2010  . h/o OSA (obstructive sleep apnea) 08/11/2010  . Arthralgia 07/28/2010  . Hyperlipidemia  07/28/2010  . Hypertension 07/28/2010  . Depression, recurrent ([place]) 07/28/2010   Past Medical History:  Diagnosis Date  . Anxiety    NO MEDS  . Arthritis   . Back pain   . Cervical cancer screening 07/20/2012   Menarche at 13, regular til recently No h/o abnormal paps, last pap 2-3 years ago G0P0  MGM today, no h/o abnormal MGMs Spotting now monthly and scant H/o uterine polyps  . Condyloma acuminata 2020  . Depression    counseling in 2010 for 2 months on medication  . Fibrocystic breast 1988  . Fibroids 2014  . Fracture of right fibula 05/19/2015   October 2016  . GERD (gastroesophageal reflux disease) 2005   treated with omeprazole  . High cholesterol 2002  . History of sinusitis    once/twice per year  . Hyperglycemia 05/19/2015  . Hypertension 2002  . Joint pain   . MRSA (methicillin resistant staph aureus) culture positive 2007  . Muscular pain   . Obesity   . OSA (obstructive sleep apnea)    +sleep study 08/2010., DOES NOT USE CPAP, USES MOSES MOUTH PIECE  . Osteoarthritis    KNEES, HANDS, HIPS  . Osteoporosis   . Plantar fasciitis of left foot 03/2014  . Seasonal allergies    affected in the spring  . Sleep apnea    mouthpiece "[facility]"  . Uterine polyp   . 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 Vitamin D deficiency   . Wrist fracture 07/2012   right    Family History  Problem Relation Age of Onset  . Osteoarthritis Mother   . Hyperlipidemia Mother   . Depression Mother   . Alzheimer's disease Mother   . Hypertension Mother   . Sleep apnea Mother   . Obesity Mother   . Liver cancer Maternal Grandmother        mets to breast  . Breast cancer Maternal Grandmother        liver mets to breast  . Cancer Maternal Grandmother        with mets  . Hyperlipidemia Father   . Coronary artery disease Father   . Hypertension Father   . Stroke Father        multiple  . Diabetes Father   . Obesity Father   . Sleep apnea Father   .  Colon cancer Neg Hx     Past Surgical History:  Procedure Laterality Date  . DILATATION & CURETTAGE/HYSTEROSCOPY WITH MYOSURE N/A 07/30/2014   Procedure: DILATATION & CURETTAGE/HYSTEROSCOPY WITH MYOSURE;  Surgeon: Nunzio Cobbs, MD;  Location: [facility];  Service: Gynecology;  Laterality: N/A;  extra time for BMI 49.  Marland Kitchen DILATATION & CURRETTAGE/HYSTEROSCOPY WITH RESECTOCOPE N/A 11/24/2012   Procedure: DILATATION &  CURETTAGE/HYSTEROSCOPY WITH RESECTOCOPE;  Surgeon: Arloa Koh, MD;  Location: [facility];  Service: Gynecology;  Laterality: N/A;  . ESSURE TUBAL LIGATION Left   . TONSILLECTOMY  1969  . WISDOM TOOTH EXTRACTION  1979  . WRIST FRACTURE SURGERY Right 07/2012   Social History   Occupational History  . Not on file  Tobacco Use  . Smoking status: Former Smoker    Packs/day: 0.50    Years: 6.00    Pack years: 3.00    Types: Cigarettes    Quit date: 03/01/1989    Years since quitting: 31.3  . Smokeless tobacco: Never Used  . Tobacco comment: Quit in 1991- started in 1983 1/2ppd  Vaping Use  . Vaping Use: Never used  Substance and Sexual Activity  . Alcohol use: Yes    Alcohol/week: 0.0 standard drinks    Comment: 2 drinks/month  . Drug use: No  . Sexual activity: Not Currently    Partners: Male    Birth control/protection: Other-see comments, I.U.D.    Comment: vasectomy/Mirena IUD inserted 03-04-16

## 2020-07-25 ENCOUNTER — Encounter: Payer: Self-pay | Admitting: Obstetrics and Gynecology

## 2020-07-25 ENCOUNTER — Encounter: Payer: Self-pay | Admitting: Family Medicine

## 2020-07-25 ENCOUNTER — Encounter: Payer: 59 | Admitting: Family Medicine

## 2020-07-25 MED ORDER — OXYBUTYNIN CHLORIDE ER 15 MG PO TB24: ORAL_TABLET | ORAL | 0 refills | Status: DC

## 2020-07-26 ENCOUNTER — Other Ambulatory Visit: Payer: Self-pay

## 2020-07-26 ENCOUNTER — Ambulatory Visit (HOSPITAL_BASED_OUTPATIENT_CLINIC_OR_DEPARTMENT_OTHER)
Admission: RE | Admit: 2020-07-26 | Discharge: 2020-07-26 | Disposition: A | Discharge status: Home / Self Care | Payer: PRIVATE HEALTH INSURANCE | Source: Ambulatory Visit | Attending: Orthopaedic Surgery | Admitting: Orthopaedic Surgery

## 2020-07-26 DIAGNOSIS — M5412 Radiculopathy, cervical region: Secondary | ICD-10-CM | POA: Diagnosis present

## 2020-07-26 DIAGNOSIS — M542 Cervicalgia: Secondary | ICD-10-CM | POA: Diagnosis present

## 2020-07-29 ENCOUNTER — Other Ambulatory Visit: Payer: Self-pay

## 2020-07-29 DIAGNOSIS — F419 Anxiety disorder, unspecified: Secondary | ICD-10-CM

## 2020-07-29 DIAGNOSIS — I10 Essential (primary) hypertension: Secondary | ICD-10-CM

## 2020-07-29 DIAGNOSIS — J3089 Other allergic rhinitis: Secondary | ICD-10-CM

## 2020-07-29 MED ORDER — MONTELUKAST SODIUM 10 MG PO TABS: 10.0000 mg | ORAL_TABLET | Freq: Every day | ORAL | 0 refills | Status: DC

## 2020-07-29 MED ORDER — BUSPIRONE HCL 7.5 MG PO TABS: 7.5000 mg | ORAL_TABLET | Freq: Two times a day (BID) | ORAL | 0 refills | Status: DC

## 2020-07-29 MED ORDER — LISINOPRIL-HYDROCHLOROTHIAZIDE 20-25 MG PO TABS: 1.0000 | ORAL_TABLET | Freq: Every day | ORAL | 0 refills | Status: DC

## 2020-08-04 ENCOUNTER — Other Ambulatory Visit: Payer: Self-pay

## 2020-08-04 ENCOUNTER — Ambulatory Visit (INDEPENDENT_AMBULATORY_CARE_PROVIDER_SITE_OTHER): Payer: PRIVATE HEALTH INSURANCE | Admitting: Orthopaedic Surgery

## 2020-08-04 ENCOUNTER — Encounter: Payer: Self-pay | Admitting: Orthopaedic Surgery

## 2020-08-04 VITALS — Ht 63.0 in | Wt 242.2 lb

## 2020-08-04 DIAGNOSIS — M542 Cervicalgia: Secondary | ICD-10-CM

## 2020-08-04 DIAGNOSIS — M5412 Radiculopathy, cervical region: Secondary | ICD-10-CM

## 2020-08-04 NOTE — Progress Notes (Signed)
The patient comes in today to go over an MRI of her cervical spine.  She has been having radicular symptoms from her neck going down into the lateral aspect of her left hand.  She is now having some numbness and tingling on the right side.  I did start Neurontin 300 mg to take at bedtime and that is working well for her.  I did go over the MRI findings with her.  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  There is moderate C7 foraminal narrowing on the left.  The radiologist also dictates that there is severe bilateral C6 narrowing of the foramina slightly worse on the right.  I went over these MRI findings with the patient.  She would like to try a short course of physical therapy on her cervical spine and I recommended this as well.  I also recommended she go up on her 300 mg of Neurontin to twice daily.  All questions and concerns were answered addressed.  We will reevaluate her in 4 weeks.

## 2020-08-04 NOTE — Addendum Note (Signed)
Addended by: Meyer Cory on: 08/04/2020 12:03 PM   Modules accepted: Orders

## 2020-08-14 ENCOUNTER — Other Ambulatory Visit: Payer: Self-pay

## 2020-08-14 ENCOUNTER — Ambulatory Visit: Payer: No Typology Code available for payment source | Attending: Orthopaedic Surgery | Admitting: Physical Therapy

## 2020-08-14 ENCOUNTER — Encounter: Payer: Self-pay | Admitting: Physical Therapy

## 2020-08-14 DIAGNOSIS — R293 Abnormal posture: Secondary | ICD-10-CM | POA: Diagnosis present

## 2020-08-14 DIAGNOSIS — R202 Paresthesia of skin: Secondary | ICD-10-CM | POA: Diagnosis present

## 2020-08-14 DIAGNOSIS — M5412 Radiculopathy, cervical region: Secondary | ICD-10-CM | POA: Diagnosis present

## 2020-08-14 NOTE — Therapy (Signed)
[facility] [address]  [place], [place], [address] Phone: [phone]   Fax:  [phone]  Physical Therapy Evaluation  Patient Details  Name: Alejandra Miller MRN: [id number] Date of Birth: [birth year] Referring Provider (PT): Jean Rosenthal, MD   Encounter Date: 08/14/2020   PT End of Session - 08/14/20 0843     Visit Number 1    Number of Visits 7    Date for PT Re-Evaluation 09/25/20    Authorization Type [employer]    Authorization - Number of Visits 30    PT Start Time 0757    PT Stop Time 0837    PT Time Calculation (min) 40 min    Activity Tolerance Patient tolerated treatment well;Patient limited by pain    Behavior During Therapy [facility] for tasks assessed/performed             Past Medical History:  Diagnosis Date   Anxiety    NO MEDS   Arthritis    Back pain    Cervical cancer screening 07/20/2012   Menarche at 13, regular til recently No h/o abnormal paps, last pap 2-3 years ago G0P0  MGM today, no h/o abnormal MGMs Spotting now monthly and scant H/o uterine polyps   Condyloma acuminata 2020   Depression    counseling in 2010 for 2 months on medication   Fibrocystic breast 1988   Fibroids 2014   Fracture of right fibula 05/19/2015   October 2016   GERD (gastroesophageal reflux disease) 2005   treated with omeprazole   High cholesterol 2002   History of sinusitis    once/twice per year   Hyperglycemia 05/19/2015   Hypertension 2002   Joint pain    MRSA (methicillin resistant staph aureus) culture positive 2007   Muscular pain    Obesity    OSA (obstructive sleep apnea)    +sleep study 08/2010., DOES NOT USE CPAP, USES MOSES MOUTH PIECE   Osteoarthritis    KNEES, HANDS, HIPS   Osteoporosis    Plantar fasciitis of left foot 03/2014   Seasonal allergies    affected in the spring   Sleep apnea    mouthpiece "[facility]"   Uterine polyp    Uterine polyp 05/19/2015   Recurrent Managed by Dr  Rolly Pancake   Vitamin D deficiency    Wrist fracture 07/2012   right    Past Surgical History:  Procedure Laterality Date   DILATATION & CURETTAGE/HYSTEROSCOPY WITH MYOSURE N/A 07/30/2014   Procedure: DILATATION & CURETTAGE/HYSTEROSCOPY WITH MYOSURE;  Surgeon: Nunzio Cobbs, MD;  Location: [facility];  Service: Gynecology;  Laterality: N/A;  extra time for BMI 49.   DILATATION & CURRETTAGE/HYSTEROSCOPY WITH RESECTOCOPE N/A 11/24/2012   Procedure: DILATATION & CURETTAGE/HYSTEROSCOPY WITH RESECTOCOPE;  Surgeon: Arloa Koh, MD;  Location: [facility];  Service: Gynecology;  Laterality: N/A;   ESSURE TUBAL LIGATION Left    TONSILLECTOMY  1969   WISDOM TOOTH EXTRACTION  1979   WRIST FRACTURE SURGERY Right 07/2012    There were no vitals filed for this visit.    Subjective Assessment - 08/14/20 0759     Subjective Patient reports neck pain started sometime last year. Was treated with PT massage, acupuncture, and chiropractor which helped. Had to take a break from these treatments, and reports recurrence of pain in March 2022. Pain and stiffness is located in the posterior neck pain and also experiences pain in the L shoulder with radiation  down the L UE to the medial 3 fingers. Now reports 2 weeks of the same discomfort in the R UE. Would like to try conservative treatment first as she is trying to avoid surgery. Pain worse withstanding up from the couch, holding arms overhead, lifting. Better with meds, ice pack.    Pertinent History anxiety, back pain, depression, R fibular fx 2017, GERD, HLD, hyperglycemia, HTN, MRSA 2007, osteoporosis, L plantar fasciits, R wrist fx surgery 2014    Limitations House hold activities;Lifting    Diagnostic tests 07/26/20 cervical MRI: mild spinal stenosis at C5-6 and C6-7, moderate  right C3 and severe right C5 foraminal stenosis, with moderate left  C7 foraminal narrowing    Patient Stated Goals decrease pain    Currently in Pain? Yes    Pain Score 2      Pain Location Neck    Pain Orientation Right;Left;Posterior    Pain Descriptors / Indicators Constant   stiffness   Pain Type Chronic pain    Pain Radiating Towards B UEs to the hand                [facility] PT Assessment - 08/14/20 0807       Assessment   Medical Diagnosis Cervicalgia, L cervical radiculopathy    Referring Provider (PT) Jean Rosenthal, MD    Onset Date/Surgical Date 08/15/19    Hand Dominance Right    Next MD Visit 09/02/20    Prior Therapy yes      Precautions   Precautions --   osteoporosis     Balance Screen   Has the patient fallen in the past 6 months No    Has the patient had a decrease in activity level because of a fear of falling?  No    Is the patient reluctant to leave their home because of a fear of falling?  No      Home Social worker Private residence    Living Arrangements Spouse/significant other    Available Help at Discharge Family    Type of [place]      Prior Function   Level of Independence Independent    Vocation Full time employment    Vocation Requirements work from home in addition to home visits; walking, standing, driving    Leisure none      Cognition   Overall Cognitive Status Within Functional Limits for tasks assessed      Sensation   Light Touch Appears Intact      Coordination   Gross Motor Movements are Fluid and Coordinated Yes      Posture/Postural Control   Posture/Postural Control Postural limitations    Postural Limitations Rounded Shoulders;Forward head    Posture Comments mild/moderate Dowager's hump evident      ROM / Strength   AROM / PROM / Strength AROM;Strength      AROM   AROM Assessment Site Cervical    Cervical Flexion 45    Cervical Extension 51   c/o increased L UE N/T   Cervical - Right Side Bend 30    Cervical - Left Side Bend 25   c/o slightly increased L UE N/T   Cervical - Right Rotation 50    Cervical - Left Rotation 58   L UE N/T     Strength    Strength Assessment Site Shoulder;Elbow;Wrist;Hand    Right/Left Shoulder Right;Left    Right Shoulder Flexion 4+/5    Right Shoulder ABduction 4/5  Right Shoulder Internal Rotation 4/5    Right Shoulder External Rotation 4/5    Left Shoulder Flexion 4+/5    Left Shoulder ABduction 4/5    Left Shoulder Internal Rotation 4/5   neck pain   Left Shoulder External Rotation 4/5   neck pain   Right/Left Elbow Right;Left    Right Elbow Flexion 4+/5    Right Elbow Extension 4+/5    Left Elbow Flexion 4+/5    Left Elbow Extension 4+/5    Right/Left Wrist Right;Left    Right Wrist Flexion 4+/5    Right Wrist Extension 4+/5    Left Wrist Flexion 4+/5    Left Wrist Extension 4+/5    Right/Left hand Right;Left    Right Hand Grip (lbs) 25.33   26, 26, 24   Left Hand Grip (lbs) 27.33   30, 26, 26     Palpation   Palpation comment TTP in L rhomboids, B pec, L posterior delt; considerable tone throughout neck and shoulders B      Special Tests   Other special tests R/L spurling's positive, distraction positive                        Objective measurements completed on examination: See above findings.               PT Education - 08/14/20 0843     Education Details prognosis, POC, HEP    Person(s) Educated Patient    Methods Explanation;Demonstration;Tactile cues;Verbal cues;Handout    Comprehension Verbalized understanding;Returned demonstration              PT Short Term Goals - 08/14/20 0945       PT SHORT TERM GOAL #1   Title Patient to be independent with initial HEP.    Time 3    Period Weeks    Status New    Target Date 09/04/20               PT Long Term Goals - 08/14/20 0945       PT LONG TERM GOAL #1   Title Patient to be independent with advanced HEP.    Time 6    Period Weeks    Status New    Target Date 09/25/20      PT LONG TERM GOAL #2   Title Patient to demonstrate B shoulder strength >/=4+/5.    Time 6    Period  Weeks    Status New    Target Date 09/25/20      PT LONG TERM GOAL #3   Title Patient to demonstrate cervical AROM WFL and without pain limiting.    Time 6    Period Weeks    Status New    Target Date 09/25/20      PT LONG TERM GOAL #4   Title Patient to report 75% improvement in tolerance for household lifitng activities.    Time 8    Period Weeks    Status New    Target Date 09/25/20      PT LONG TERM GOAL #5   Title Patient to report centralization of pain to the cervical spine.    Time 6    Period Weeks    Status New    Target Date 09/25/20                    Plan - 08/14/20 0934     Clinical Impression Statement Patient  is a 60 y/o F presenting to OPPT with c/o chronic neck pain for the past 3 months. Pain is located over the posterior neck with radiation of pain and N/T to the L UE and digits 3-5. Also recently noting similar discomfort in the R UE. Worse with standing up from the couch, holding arms overhead, lifting. Patient today presenting with rounded shoulders and forward head posture, B shoulder weakness, painful and limited cervical AROM, TTP in L rhomboids, B pec, L posterior delt, considerable tone throughout B neck and shoulders, and positive R/L Spurling's and Distraction. Patient was educated on gentle postural correction HEP and advised to avoid pushing into pain or N/T- patient reported understanding. Would benefit from skilled PT services 1x/week for 6 weeks to address aforementioned impairments.    Personal Factors and Comorbidities Age;Comorbidity 3+;Past/Current Experience;Profession;Time since onset of injury/illness/exacerbation    Comorbidities anxiety, back pain, depression, R fibular fx 2017, GERD, HLD, hyperglycemia, HTN, MRSA 2007, osteoporosis, L plantar fasciits, R wrist fx surgery 2014    Examination-Activity Limitations Bend;Squat;Sit;Carry;Stand;Transfers;Dressing;Hygiene/Grooming;Lift;Reach Overhead    Examination-Participation  Restrictions Cleaning;Shop;Community Activity;Driving;Laundry;Meal Prep;Occupation    Stability/Clinical Decision Making Evolving/Moderate complexity    Clinical Decision Making Moderate    Rehab Potential Good    PT Frequency 1x / week    PT Duration 6 weeks    PT Treatment/Interventions ADLs/Self Care Home Management;Cryotherapy;Electrical Stimulation;Moist Heat;Traction;Therapeutic exercise;Therapeutic activities;Functional mobility training;Ultrasound;Neuromuscular re-education;Patient/family education;Manual techniques;Taping;Energy conservation;Dry needling;Passive range of motion    PT Next Visit Plan cervical FOTO; reassess HEP; progress postural strengthening; cervical stretching to tolerance    Consulted and Agree with Plan of Care Patient             Patient will benefit from skilled therapeutic intervention in order to improve the following deficits and impairments:  Hypomobility, Decreased activity tolerance, Decreased strength, Increased fascial restricitons, Impaired UE functional use, Pain, Increased muscle spasms, Improper body mechanics, Decreased range of motion, Postural dysfunction, Impaired flexibility  Visit Diagnosis: Radiculopathy, cervical region  Paresthesia of skin  Abnormal posture     Problem List Patient Active Problem List   Diagnosis Date Noted   Insulin resistance 04/10/2018   Class 3 severe obesity with serious comorbidity and body mass index (BMI) of 40.0 to 44.9 in adult [facility] 04/10/2018   Osteoarthritis    Fracture of right fibula 05/19/2015   h/o Hyperglycemia 05/19/2015   Bursitis of shoulder 11/05/2013   Cervical cancer screening 07/20/2012   BMI 40.0-44.9, adult ([place]) 02/12/2012   Leg swelling 12/08/2011   Annual physical exam 07/28/2011   Menstrual irregularity 12/15/2010   Vitamin D deficiency 12/15/2010   h/o OSA (obstructive sleep apnea) 08/11/2010   Arthralgia 07/28/2010   Hyperlipidemia 07/28/2010   Hypertension 07/28/2010    Depression, recurrent ([place]) 07/28/2010     Janene Harvey, PT, DPT 08/14/20 9:49 AM    [facility] [address]  [place], [place], [address] Phone: [phone]   Fax:  [phone]  Name: Alejandra Miller MRN: [id number] Date of Birth: 08/29/1960

## 2020-08-21 ENCOUNTER — Other Ambulatory Visit: Payer: Self-pay

## 2020-08-21 ENCOUNTER — Ambulatory Visit: Payer: No Typology Code available for payment source

## 2020-08-21 DIAGNOSIS — M5412 Radiculopathy, cervical region: Secondary | ICD-10-CM

## 2020-08-21 DIAGNOSIS — R202 Paresthesia of skin: Secondary | ICD-10-CM

## 2020-08-21 DIAGNOSIS — R293 Abnormal posture: Secondary | ICD-10-CM

## 2020-08-21 NOTE — Therapy (Signed)
[facility] [address]  [place], [place], [address] Phone: [phone]   Fax:  [phone]  Physical Therapy Treatment  Patient Details  Name: Alejandra Miller MRN: [id number] Date of Birth: 1960/09/30 Referring Provider (PT): Jean Rosenthal, MD   Encounter Date: 08/21/2020   PT End of Session - 08/21/20 0844     Visit Number 2    Number of Visits 7    Date for PT Re-Evaluation 09/25/20    Authorization Type [employer]    Authorization - Number of Visits 30    PT Start Time 0759    PT Stop Time 0841    PT Time Calculation (min) 42 min    Activity Tolerance Patient tolerated treatment well    Behavior During Therapy [facility] for tasks assessed/performed             Past Medical History:  Diagnosis Date   Anxiety    NO MEDS   Arthritis    Back pain    Cervical cancer screening 07/20/2012   Menarche at 13, regular til recently No h/o abnormal paps, last pap 2-3 years ago G0P0  MGM today, no h/o abnormal MGMs Spotting now monthly and scant H/o uterine polyps   Condyloma acuminata 2020   Depression    counseling in 2010 for 2 months on medication   Fibrocystic breast 1988   Fibroids 2014   Fracture of right fibula 05/19/2015   October 2016   GERD (gastroesophageal reflux disease) 2005   treated with omeprazole   High cholesterol 2002   History of sinusitis    once/twice per year   Hyperglycemia 05/19/2015   Hypertension 2002   Joint pain    MRSA (methicillin resistant staph aureus) culture positive 2007   Muscular pain    Obesity    OSA (obstructive sleep apnea)    +sleep study 08/2010., DOES NOT USE CPAP, USES MOSES MOUTH PIECE   Osteoarthritis    KNEES, HANDS, HIPS   Osteoporosis    Plantar fasciitis of left foot 03/2014   Seasonal allergies    affected in the spring   Sleep apnea    mouthpiece "[facility]"   Uterine polyp    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   DILATATION & CURRETTAGE/HYSTEROSCOPY WITH RESECTOCOPE N/A 11/24/2012   Procedure: DILATATION & CURETTAGE/HYSTEROSCOPY WITH RESECTOCOPE;  Surgeon: Arloa Koh, MD;  Location: [facility];  Service: Gynecology;  Laterality: N/A;   ESSURE TUBAL LIGATION Left    TONSILLECTOMY  1969   WISDOM TOOTH EXTRACTION  1979   WRIST FRACTURE SURGERY Right 07/2012    There were no vitals filed for this visit.   Subjective Assessment - 08/21/20 0803     Subjective Has had N/T still down the back of the arm, down to the ring and pinky fingers.    Pertinent History anxiety, back pain, depression, R fibular fx 2017, GERD, HLD, hyperglycemia, HTN, MRSA 2007, osteoporosis, L plantar fasciits, R wrist fx surgery 2014    Diagnostic tests 07/26/20 cervical MRI: mild spinal stenosis at C5-6  and C6-7, moderate  right C3 and severe right C5 foraminal stenosis, with moderate left  C7 foraminal narrowing    Patient Stated Goals decrease pain    Currently in Pain? No/denies                [facility] PT Assessment - 08/21/20 0001       Observation/Other Assessments   Focus on Therapeutic Outcomes (FOTO)  Neck: 71                           [facility] Adult PT Treatment/Exercise - 08/21/20 0001       Exercises   Exercises Neck;Shoulder      Neck Exercises: Machines for Strengthening   UBE (Upper Arm Bike) L1 37min fwd/67min back      Neck Exercises: Seated   Neck Retraction 10 reps;3 secs    Neck Retraction Limitations cues to depress shoulders    Cervical Rotation Both;10 reps    Cervical Rotation Limitations 10x3"      Shoulder Exercises: Seated    Retraction Strengthening;Both;10 reps      Shoulder Exercises: Standing   External Rotation Strengthening;Left;10 reps;Theraband    Theraband Level (Shoulder External Rotation) Level 2 (Red)    Extension Strengthening;Both;10 reps;Theraband    Theraband Level (Shoulder Extension) Level 2 (Red)    Row Strengthening;Both;20 reps;Theraband    Theraband Level (Shoulder Row) Level 2 (Red)    Row Limitations TC and VC to retr    Other Standing Exercises B ER with red TB 10 reps      Shoulder Exercises: Stretch   Corner Stretch 2 reps;30 seconds    Corner Stretch Limitations in corner of wall      Neck Exercises: Stretches   Upper Trapezius Stretch Right;Left;2 reps;30 seconds    Upper Trapezius Stretch Limitations seated                      PT Short Term Goals - 08/21/20 0845       PT SHORT TERM GOAL #1   Title Patient to be independent with initial HEP.    Time 3    Period Weeks    Status On-going    Target Date 09/04/20               PT Long Term Goals - 08/21/20 0845       PT LONG TERM GOAL #1   Title Patient to be independent with advanced HEP.    Time 6    Period Weeks    Status On-going      PT LONG TERM GOAL #2   Title Patient to demonstrate B shoulder strength >/=4+/5.    Time 6    Period Weeks    Status On-going      PT LONG TERM GOAL #3   Title Patient to demonstrate cervical AROM WFL and without pain limiting.    Time 6    Period Weeks    Status On-going      PT LONG TERM GOAL #4   Title Patient to report 75% improvement in tolerance for household lifitng activities.    Time 8    Period Weeks    Status On-going      PT LONG TERM GOAL #5   Title Patient to report centralization of pain to the cervical spine.    Time 6    Period Weeks    Status On-going  Plan - 08/21/20 0846     Clinical Impression Statement Pt has continued to have paresthesia in her L posterior arm down to her ring and pinky  fingers but intermittent. Reviewed HEP with her and cleared up her concerns for pec corner stretches. Instructed her to keep her shoulders depressed during exercises and TC required to emphasize capular retraction during exercises. Educated her on trying to keep better scap depression and retraction at rest everyday to promote better shoulder mechnics. She noted that her HEP was giving her a good challenge so deferred updates this week. Pt responded well.    Personal Factors and Comorbidities Age;Comorbidity 3+;Past/Current Experience;Profession;Time since onset of injury/illness/exacerbation    Comorbidities anxiety, back pain, depression, R fibular fx 2017, GERD, HLD, hyperglycemia, HTN, MRSA 2007, osteoporosis, L plantar fasciits, R wrist fx surgery 2014    PT Frequency 1x / week    PT Duration 6 weeks    PT Treatment/Interventions ADLs/Self Care Home Management;Cryotherapy;Electrical Stimulation;Moist Heat;Traction;Therapeutic exercise;Therapeutic activities;Functional mobility training;Ultrasound;Neuromuscular re-education;Patient/family education;Manual techniques;Taping;Energy conservation;Dry needling;Passive range of motion    PT Next Visit Plan progress postural strengthening; cervical stretching to tolerance    Consulted and Agree with Plan of Care Patient             Patient will benefit from skilled therapeutic intervention in order to improve the following deficits and impairments:  Hypomobility, Decreased activity tolerance, Decreased strength, Increased fascial restricitons, Impaired UE functional use, Pain, Increased muscle spasms, Improper body mechanics, Decreased range of motion, Postural dysfunction, Impaired flexibility  Visit Diagnosis: Radiculopathy, cervical region  Paresthesia of skin  Abnormal posture     Problem List Patient Active Problem List   Diagnosis Date Noted   Insulin resistance 04/10/2018   Class 3 severe obesity with serious comorbidity and body  mass index (BMI) of 40.0 to 44.9 in adult [facility]) 04/10/2018   Osteoarthritis    Fracture of right fibula 05/19/2015   h/o Hyperglycemia 05/19/2015   Bursitis of shoulder 11/05/2013   Cervical cancer screening 07/20/2012   BMI 40.0-44.9, adult ([place]) 02/12/2012   Leg swelling 12/08/2011   Annual physical exam 07/28/2011   Menstrual irregularity 12/15/2010   Vitamin D deficiency 12/15/2010   h/o OSA (obstructive sleep apnea) 08/11/2010   Arthralgia 07/28/2010   Hyperlipidemia 07/28/2010   Hypertension 07/28/2010   Depression, recurrent ([place]) 07/28/2010    Artist Pais, PTA 08/21/2020, 8:54 AM  [facility] [address]  [place], [place], [address] Phone: [phone]   Fax:  [phone]  Name: Alejandra Miller MRN: [id number] Date of Birth: [birth year]

## 2020-08-24 ENCOUNTER — Other Ambulatory Visit: Payer: Self-pay | Admitting: Family Medicine

## 2020-08-24 DIAGNOSIS — F339 Major depressive disorder, recurrent, unspecified: Secondary | ICD-10-CM

## 2020-08-25 NOTE — Telephone Encounter (Signed)
Last OV 06/06/20 Next appt 09/12/20 Chart supports Rx.

## 2020-08-28 ENCOUNTER — Ambulatory Visit: Payer: No Typology Code available for payment source | Admitting: Physical Therapy

## 2020-08-28 ENCOUNTER — Other Ambulatory Visit: Payer: Self-pay

## 2020-08-28 DIAGNOSIS — M5412 Radiculopathy, cervical region: Secondary | ICD-10-CM | POA: Diagnosis not present

## 2020-08-28 DIAGNOSIS — R293 Abnormal posture: Secondary | ICD-10-CM

## 2020-08-28 DIAGNOSIS — R202 Paresthesia of skin: Secondary | ICD-10-CM

## 2020-08-28 NOTE — Therapy (Signed)
[facility] [address]  [place], [place], [address] Phone: [phone]   Fax:  [phone]  Physical Therapy Treatment  Patient Details  Name: Alejandra Miller MRN: [id number] Date of Birth: [birth year] Referring Provider (PT): Jean Rosenthal, MD   Encounter Date: 08/28/2020   PT End of Session - 08/28/20 1741     Visit Number 3    Number of Visits 7    Date for PT Re-Evaluation 09/25/20    Authorization Type [employer]    Authorization - Number of Visits 30    PT Start Time 1703    PT Stop Time 1741    PT Time Calculation (min) 38 min    Activity Tolerance Patient tolerated treatment well;Patient limited by pain    Behavior During Therapy [facility] for tasks assessed/performed             Past Medical History:  Diagnosis Date   Anxiety    NO MEDS   Arthritis    Back pain    Cervical cancer screening 07/20/2012   Menarche at 13, regular til recently No h/o abnormal paps, last pap 2-3 years ago G0P0  MGM today, no h/o abnormal MGMs Spotting now monthly and scant H/o uterine polyps   Condyloma acuminata 2020   Depression    counseling in 2010 for 2 months on medication   Fibrocystic breast 1988   Fibroids 2014   Fracture of right fibula 05/19/2015   October 2016   GERD (gastroesophageal reflux disease) 2005   treated with omeprazole   High cholesterol 2002   History of sinusitis    once/twice per year   Hyperglycemia 05/19/2015   Hypertension 2002   Joint pain    MRSA (methicillin resistant staph aureus) culture positive 2007   Muscular pain    Obesity    OSA (obstructive sleep apnea)    +sleep study 08/2010., DOES NOT USE CPAP, USES MOSES MOUTH PIECE   Osteoarthritis    KNEES, HANDS, HIPS   Osteoporosis    Plantar fasciitis of left foot 03/2014   Seasonal allergies    affected in the spring   Sleep apnea    mouthpiece "[facility]"   Uterine polyp    Uterine polyp 05/19/2015   Recurrent Managed by Dr  Rolly Pancake   Vitamin D deficiency    Wrist fracture 07/2012   right    Past Surgical History:  Procedure Laterality Date   DILATATION & CURETTAGE/HYSTEROSCOPY WITH MYOSURE N/A 07/30/2014   Procedure: DILATATION & CURETTAGE/HYSTEROSCOPY WITH MYOSURE;  Surgeon: Nunzio Cobbs, MD;  Location: [facility];  Service: Gynecology;  Laterality: N/A;  extra time for BMI 49.   DILATATION & CURRETTAGE/HYSTEROSCOPY WITH RESECTOCOPE N/A 11/24/2012   Procedure: DILATATION & CURETTAGE/HYSTEROSCOPY WITH RESECTOCOPE;  Surgeon: Arloa Koh, MD;  Location: [facility];  Service: Gynecology;  Laterality: N/A;   ESSURE TUBAL LIGATION Left    TONSILLECTOMY  1969   WISDOM TOOTH EXTRACTION  1979   WRIST FRACTURE SURGERY Right 07/2012    There were no vitals filed for this visit.   Subjective Assessment - 08/28/20 1705     Subjective Notes that she is a little better. Feeling the N/T less frequently. Still noticing it during the end of the day.    Pertinent History anxiety, back pain, depression, R fibular fx 2017, GERD, HLD, hyperglycemia, HTN, MRSA 2007, osteoporosis, L plantar fasciits, R wrist fx surgery 2014    Diagnostic tests 07/26/20  cervical MRI: mild spinal stenosis at C5-6 and C6-7, moderate  right C3 and severe right C5 foraminal stenosis, with moderate left  C7 foraminal narrowing    Patient Stated Goals decrease pain    Currently in Pain? Yes    Pain Score 3     Pain Location Shoulder    Pain Orientation Right;Left    Pain Descriptors / Indicators Aching    Pain Type Chronic pain                               [facility] Adult PT Treatment/Exercise - 08/28/20 0001       Neck Exercises: Machines for Strengthening   UBE (Upper Arm Bike) L1.5 47min fwd/62min back      Neck Exercises: Seated   Neck Retraction 10 reps;3 secs    Neck Retraction Limitations cues to decrease amplitude and avoid over retraction      Neck Exercises: Supine   Neck Retraction 10 reps;3 secs     Neck Retraction Limitations towel roll      Shoulder Exercises: Seated   Row Strengthening;Both;10 reps;Theraband    Theraband Level (Shoulder Row) Level 3 (Green)    Row Limitations cues for neutral c-spine    External Rotation Strengthening;Both;10 reps    Theraband Level (Shoulder External Rotation) Level 3 (Green)    External Rotation Limitations good form      Manual Therapy   Manual Therapy Soft tissue mobilization;Myofascial release;Manual Traction    Manual therapy comments supine    Soft tissue mobilization STM to B UT, LS, cervical paraspinals, suboccipitals    Myofascial Release manual TPR to R UT, LS, suboccipital release    Manual Traction with towel assist   better tolerance in slight flexion                   PT Education - 08/28/20 1741     Education Details adinistered green TB for rows and B ER    Person(s) Educated Patient    Methods Explanation;Demonstration;Tactile cues;Verbal cues    Comprehension Verbalized understanding;Returned demonstration              PT Short Term Goals - 08/28/20 1745       PT SHORT TERM GOAL #1   Title Patient to be independent with initial HEP.    Time 3    Period Weeks    Status Achieved    Target Date 09/04/20               PT Long Term Goals - 08/21/20 0845       PT LONG TERM GOAL #1   Title Patient to be independent with advanced HEP.    Time 6    Period Weeks    Status On-going      PT LONG TERM GOAL #2   Title Patient to demonstrate B shoulder strength >/=4+/5.    Time 6    Period Weeks    Status On-going      PT LONG TERM GOAL #3   Title Patient to demonstrate cervical AROM WFL and without pain limiting.    Time 6    Period Weeks    Status On-going      PT LONG TERM GOAL #4   Title Patient to report 75% improvement in tolerance for household lifitng activities.    Time 8    Period Weeks    Status On-going  PT LONG TERM GOAL #5   Title Patient to report centralization of  pain to the cervical spine.    Time 6    Period Weeks    Status On-going                   Plan - 08/28/20 1742     Clinical Impression Statement Patient arrived to session with report of slight improvement in frequency of N/T since starting therapy. Still notes paresthesias towards the end of day. Reviewed cervical retraction with cueing to decrease amplitude of movement to avoid discomfort. Introduced this exercise in supine, however still with c/o paresthesias down the L UE- also improved with decreased amplitude. Patient tolerated manual traction well, with decrease in paresthesias with c-spine in slight flexion today. Demonstrate soft tissue restriction in R UT, LS, and suboccipitals. Educated patient on use of ball on wall/floor for suboccipital release at home. Increased resistance with periscapular strengthening today, with cueing to maintain neutral alignment. Patient reported understanding of all edu provided today and without complaints at end of session.    Personal Factors and Comorbidities Age;Comorbidity 3+;Past/Current Experience;Profession;Time since onset of injury/illness/exacerbation    Comorbidities anxiety, back pain, depression, R fibular fx 2017, GERD, HLD, hyperglycemia, HTN, MRSA 2007, osteoporosis, L plantar fasciits, R wrist fx surgery 2014    PT Frequency 1x / week    PT Duration 6 weeks    PT Treatment/Interventions ADLs/Self Care Home Management;Cryotherapy;Electrical Stimulation;Moist Heat;Traction;Therapeutic exercise;Therapeutic activities;Functional mobility training;Ultrasound;Neuromuscular re-education;Patient/family education;Manual techniques;Taping;Energy conservation;Dry needling;Passive range of motion    PT Next Visit Plan progress postural strengthening; cervical stretching to tolerance    Consulted and Agree with Plan of Care Patient             Patient will benefit from skilled therapeutic intervention in order to improve the following  deficits and impairments:  Hypomobility, Decreased activity tolerance, Decreased strength, Increased fascial restricitons, Impaired UE functional use, Pain, Increased muscle spasms, Improper body mechanics, Decreased range of motion, Postural dysfunction, Impaired flexibility  Visit Diagnosis: Radiculopathy, cervical region  Paresthesia of skin  Abnormal posture     Problem List Patient Active Problem List   Diagnosis Date Noted   Insulin resistance 04/10/2018   Class 3 severe obesity with serious comorbidity and body mass index (BMI) of 40.0 to 44.9 in adult [facility]) 04/10/2018   Osteoarthritis    Fracture of right fibula 05/19/2015   h/o Hyperglycemia 05/19/2015   Bursitis of shoulder 11/05/2013   Cervical cancer screening 07/20/2012   BMI 40.0-44.9, adult ([place]) 02/12/2012   Leg swelling 12/08/2011   Annual physical exam 07/28/2011   Menstrual irregularity 12/15/2010   Vitamin D deficiency 12/15/2010   h/o OSA (obstructive sleep apnea) 08/11/2010   Arthralgia 07/28/2010   Hyperlipidemia 07/28/2010   Hypertension 07/28/2010   Depression, recurrent ([place]) 07/28/2010     Janene Harvey, PT, DPT 08/28/20 5:47 PM    [facility] [address]  [place], [place], [address] Phone: [phone]   Fax:  [phone]  Name: MISHAWN DIDION MRN: [id number] Date of Birth: June 01, 1960

## 2020-08-29 ENCOUNTER — Encounter: Payer: Self-pay | Admitting: Family Medicine

## 2020-09-02 ENCOUNTER — Ambulatory Visit (INDEPENDENT_AMBULATORY_CARE_PROVIDER_SITE_OTHER): Payer: No Typology Code available for payment source | Admitting: Orthopaedic Surgery

## 2020-09-02 ENCOUNTER — Encounter: Payer: Self-pay | Admitting: Physical Therapy

## 2020-09-02 ENCOUNTER — Ambulatory Visit: Payer: No Typology Code available for payment source | Attending: Orthopaedic Surgery | Admitting: Physical Therapy

## 2020-09-02 ENCOUNTER — Other Ambulatory Visit: Payer: Self-pay

## 2020-09-02 ENCOUNTER — Encounter: Payer: Self-pay | Admitting: Orthopaedic Surgery

## 2020-09-02 DIAGNOSIS — M542 Cervicalgia: Secondary | ICD-10-CM | POA: Diagnosis not present

## 2020-09-02 DIAGNOSIS — R293 Abnormal posture: Secondary | ICD-10-CM | POA: Diagnosis present

## 2020-09-02 DIAGNOSIS — R202 Paresthesia of skin: Secondary | ICD-10-CM | POA: Diagnosis present

## 2020-09-02 DIAGNOSIS — M5412 Radiculopathy, cervical region: Secondary | ICD-10-CM

## 2020-09-02 NOTE — Progress Notes (Signed)
The patient comes in for continued follow-up as it relates to neck pain and radicular symptoms going down her left arm with mainly numbness and tingling in the ulnar aspect of her left hand.  She has been through 4 physical therapy sessions and reports that the Neurontin is helping her more than the therapy and she has not seen a huge improvement yet.  She cannot take the Neurontin during the day because it makes her too sleepy.  She has already had a MRI of the cervical spine.  That MRI of the cervical spine showed multilevel cervical spondylosis with stenosis at C5-C6 and C6-C7 but the most notable findings were severe C5 foraminal stenosis and moderate C7 foraminal narrowing.  Her symptoms to be related to the C7 left foraminal narrowing.  She still has subjective numbness in her left hand at the ulnar aspect.  Her intrinsics are not true weak nor does her triceps function.  She has negative Tinel's sign at the cubital tunnel.  She prefers to continue physical therapy.  One other option would be having Dr. Ernestina Patches consider a left-sided C7 epidural steroid injection.  I described this to her as well and she will let us know if she would like that to be set up.  The other option would be a surgical referral.  I will see her back in 4 weeks.  If she decides to have an injection she will let us know so we can set that up.

## 2020-09-02 NOTE — Therapy (Signed)
[facility] [address]  [place], [place], [address] Phone: [phone]   Fax:  [phone]  Physical Therapy Treatment  Patient Details  Name: Alejandra Miller MRN: [id number] Date of Birth: 1960/10/02 Referring Provider (PT): Jean Rosenthal, MD   Encounter Date: 09/02/2020   PT End of Session - 09/02/20 0842     Visit Number 4    Number of Visits 7    Date for PT Re-Evaluation 09/25/20    Authorization Type [employer]    Authorization - Number of Visits 30    PT Start Time 0759    PT Stop Time 0841    PT Time Calculation (min) 42 min    Activity Tolerance Patient tolerated treatment well;Patient limited by pain    Behavior During Therapy [facility] for tasks assessed/performed             Past Medical History:  Diagnosis Date   Anxiety    NO MEDS   Arthritis    Back pain    Cervical cancer screening 07/20/2012   Menarche at 13, regular til recently No h/o abnormal paps, last pap 2-3 years ago G0P0  MGM today, no h/o abnormal MGMs Spotting now monthly and scant H/o uterine polyps   Condyloma acuminata 2020   Depression    counseling in 2010 for 2 months on medication   Fibrocystic breast 1988   Fibroids 2014   Fracture of right fibula 05/19/2015   October 2016   GERD (gastroesophageal reflux disease) 2005   treated with omeprazole   High cholesterol 2002   History of sinusitis    once/twice per year   Hyperglycemia 05/19/2015   Hypertension 2002   Joint pain    MRSA (methicillin resistant staph aureus) culture positive 2007   Muscular pain    Obesity    OSA (obstructive sleep apnea)    +sleep study 08/2010., DOES NOT USE CPAP, USES MOSES MOUTH PIECE   Osteoarthritis    KNEES, HANDS, HIPS   Osteoporosis    Plantar fasciitis of left foot 03/2014   Seasonal allergies    affected in the spring   Sleep apnea    mouthpiece "[facility]"   Uterine polyp    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   DILATATION & CURRETTAGE/HYSTEROSCOPY WITH RESECTOCOPE N/A 11/24/2012   Procedure: DILATATION & CURETTAGE/HYSTEROSCOPY WITH RESECTOCOPE;  Surgeon: Arloa Koh, MD;  Location: [facility];  Service: Gynecology;  Laterality: N/A;   ESSURE TUBAL LIGATION Left    TONSILLECTOMY  1969   WISDOM TOOTH EXTRACTION  1979   WRIST FRACTURE SURGERY Right 07/2012    There were no vitals filed for this visit.   Subjective Assessment - 09/02/20 0801     Subjective Reports muscle soreness after last session. Denies flare in pain. Has not attempted using her at home traction unit.    Pertinent History anxiety, back pain, depression, R fibular fx 2017, GERD, HLD, hyperglycemia, HTN, MRSA 2007, osteoporosis, L plantar fasciits, R wrist fx surgery 2014    Diagnostic tests 07/26/20 cervical MRI:  mild spinal stenosis at C5-6 and C6-7, moderate  right C3 and severe right C5 foraminal stenosis, with moderate left  C7 foraminal narrowing    Patient Stated Goals decrease pain    Currently in Pain? Yes    Pain Score 4     Pain Location Shoulder    Pain Orientation Left    Pain Descriptors / Indicators Aching    Pain Type Chronic pain                               [facility] Adult PT Treatment/Exercise - 09/02/20 0001       Neck Exercises: Machines for Strengthening   UBE (Upper Arm Bike) L2.0 32min fwd/63min back      Neck Exercises: Supine   Neck Retraction 10 reps;3 secs    Neck Retraction Limitations larger towel roll   c/o N/T in the L UE   Cervical Rotation Right;Left;10 reps    Cervical Rotation Limitations SNAGs to  tolerance   cues to avoid pushing inot pain and trunk rotation   Other Supine Exercise cervical extension SNAG to tolerance 10x   cues to avoid pushing into pain   Other Supine Exercise supine DNF lift offs 10x5"      Shoulder Exercises: Seated   Horizontal ABduction Strengthening;Both;10 reps;Theraband    Theraband Level (Shoulder Horizontal ABduction) Level 2 (Red)    Horizontal ABduction Limitations 2x10; standing at doorframe- moved to supine d/t L UE N/T      Shoulder Exercises: Stretch   Corner Stretch 2 reps;30 seconds    Corner Stretch Limitations in corner of wall   better tolerance with 60 deg angle     Manual Therapy   Manual Therapy Soft tissue mobilization;Myofascial release;Manual Traction    Manual therapy comments sitting    Soft tissue mobilization STM to L UT, LS, scalenes    Myofascial Release manual TPR to L UT, LS, scalenes   very tight with palpable trigger points evident                     PT Short Term Goals - 08/28/20 1745       PT SHORT TERM GOAL #1   Title Patient to be independent with initial HEP.    Time 3    Period Weeks    Status Achieved    Target Date 09/04/20               PT Long Term Goals - 08/21/20 0845       PT LONG TERM GOAL #1   Title Patient to be independent with advanced HEP.    Time 6    Period Weeks    Status On-going      PT LONG TERM GOAL #2   Title Patient to demonstrate B shoulder strength >/=4+/5.    Time 6    Period Weeks    Status On-going      PT LONG TERM GOAL #3   Title Patient to demonstrate cervical AROM WFL and without pain limiting.    Time 6    Period Weeks    Status On-going      PT LONG TERM GOAL #4   Title Patient to report 75% improvement in tolerance for household lifitng activities.    Time 8    Period Weeks    Status On-going      PT LONG TERM GOAL #5   Title  Patient to report centralization of pain to the cervical spine.    Time 6    Period Weeks    Status On-going                    Plan - 09/02/20 0843     Clinical Impression Statement Patient reporting increased muscle soreness after last session but denies increase in pain. Worked on continued practice with cervical retractions and initially DNF lift offs for increased challenge. Patient continues to report N/T in the L UE with cervical retractions. Patient performed cervical SNAGs to tolerance with fairly good ROM today. Patient reported discomfort in anterior shoulders and N/T in L UE with pec stretch; better tolerance with modification of UE positioning. Progressed periscapular strengthening in standing with same c/o L UE N/T- better tolerance in supine. Ended session with STM and TPR to L UT, LS, and scalenes to address pain and soft tissue restriction. Patient without complaints at end of session.    Personal Factors and Comorbidities Age;Comorbidity 3+;Past/Current Experience;Profession;Time since onset of injury/illness/exacerbation    Comorbidities anxiety, back pain, depression, R fibular fx 2017, GERD, HLD, hyperglycemia, HTN, MRSA 2007, osteoporosis, L plantar fasciits, R wrist fx surgery 2014    PT Frequency 1x / week    PT Duration 6 weeks    PT Treatment/Interventions ADLs/Self Care Home Management;Cryotherapy;Electrical Stimulation;Moist Heat;Traction;Therapeutic exercise;Therapeutic activities;Functional mobility training;Ultrasound;Neuromuscular re-education;Patient/family education;Manual techniques;Taping;Energy conservation;Dry needling;Passive range of motion    PT Next Visit Plan progress postural strengthening; cervical stretching to tolerance    Consulted and Agree with Plan of Care Patient             Patient will benefit from skilled therapeutic intervention in order to improve the following deficits and impairments:  Hypomobility, Decreased activity tolerance, Decreased strength, Increased fascial restricitons, Impaired UE functional use, Pain, Increased muscle spasms,  Improper body mechanics, Decreased range of motion, Postural dysfunction, Impaired flexibility  Visit Diagnosis: Radiculopathy, cervical region  Paresthesia of skin  Abnormal posture     Problem List Patient Active Problem List   Diagnosis Date Noted   Insulin resistance 04/10/2018   Class 3 severe obesity with serious comorbidity and body mass index (BMI) of 40.0 to 44.9 in adult [facility]) 04/10/2018   Osteoarthritis    Fracture of right fibula 05/19/2015   h/o Hyperglycemia 05/19/2015   Bursitis of shoulder 11/05/2013   Cervical cancer screening 07/20/2012   BMI 40.0-44.9, adult ([place]) 02/12/2012   Leg swelling 12/08/2011   Annual physical exam 07/28/2011   Menstrual irregularity 12/15/2010   Vitamin D deficiency 12/15/2010   h/o OSA (obstructive sleep apnea) 08/11/2010   Arthralgia 07/28/2010   Hyperlipidemia 07/28/2010   Hypertension 07/28/2010   Depression, recurrent ([place]) 07/28/2010     Janene Harvey, PT, DPT 09/02/20 8:49 AM    [facility] [address]  [place], [place], [address] Phone: [phone]   Fax:  [phone]  Name: Alejandra Miller MRN: [id number] Date of Birth: Jun 19, 1960

## 2020-09-03 ENCOUNTER — Other Ambulatory Visit: Payer: Self-pay | Admitting: Orthopaedic Surgery

## 2020-09-04 NOTE — Telephone Encounter (Signed)
ok 

## 2020-09-05 MED ORDER — CYCLOBENZAPRINE HCL 10 MG PO TABS: 10.0000 mg | ORAL_TABLET | Freq: Every day | ORAL | 1 refills | Status: DC | PRN

## 2020-09-09 ENCOUNTER — Ambulatory Visit: Payer: No Typology Code available for payment source

## 2020-09-09 ENCOUNTER — Other Ambulatory Visit: Payer: Self-pay

## 2020-09-09 DIAGNOSIS — M5412 Radiculopathy, cervical region: Secondary | ICD-10-CM | POA: Diagnosis not present

## 2020-09-09 DIAGNOSIS — R293 Abnormal posture: Secondary | ICD-10-CM

## 2020-09-09 DIAGNOSIS — R202 Paresthesia of skin: Secondary | ICD-10-CM

## 2020-09-09 NOTE — Therapy (Addendum)
[facility] [address]  [place], [place], [address] Phone: [phone]   Fax:  [phone]  Physical Therapy Treatment  Patient Details  Name: Alejandra Miller MRN: [id number] Date of Birth: 1960/04/16 Referring Provider (PT): Jean Rosenthal, MD   Encounter Date: 09/09/2020   PT End of Session - 09/09/20 1753     Visit Number 5    Number of Visits 7    Date for PT Re-Evaluation 09/25/20    Authorization Type [employer]    Authorization - Number of Visits 30    PT Start Time 1703    PT Stop Time 1743    PT Time Calculation (min) 40 min    Activity Tolerance Patient tolerated treatment well    Behavior During Therapy [facility] for tasks assessed/performed             Past Medical History:  Diagnosis Date   Anxiety    NO MEDS   Arthritis    Back pain    Cervical cancer screening 07/20/2012   Menarche at 13, regular til recently No h/o abnormal paps, last pap 2-3 years ago G0P0  MGM today, no h/o abnormal MGMs Spotting now monthly and scant H/o uterine polyps   Condyloma acuminata 2020   Depression    counseling in 2010 for 2 months on medication   Fibrocystic breast 1988   Fibroids 2014   Fracture of right fibula 05/19/2015   October 2016   GERD (gastroesophageal reflux disease) 2005   treated with omeprazole   High cholesterol 2002   History of sinusitis    once/twice per year   Hyperglycemia 05/19/2015   Hypertension 2002   Joint pain    MRSA (methicillin resistant staph aureus) culture positive 2007   Muscular pain    Obesity    OSA (obstructive sleep apnea)    +sleep study 08/2010., DOES NOT USE CPAP, USES MOSES MOUTH PIECE   Osteoarthritis    KNEES, HANDS, HIPS   Osteoporosis    Plantar fasciitis of left foot 03/2014   Seasonal allergies    affected in the spring   Sleep apnea    mouthpiece "[facility]"   Uterine polyp    Uterine polyp 05/19/2015   Recurrent Managed by Dr Rolly Pancake   Vitamin D  deficiency    Wrist fracture 07/2012   right    Past Surgical History:  Procedure Laterality Date   DILATATION & CURETTAGE/HYSTEROSCOPY WITH MYOSURE N/A 07/30/2014   Procedure: DILATATION & CURETTAGE/HYSTEROSCOPY WITH MYOSURE;  Surgeon: Nunzio Cobbs, MD;  Location: [facility];  Service: Gynecology;  Laterality: N/A;  extra time for BMI 49.   DILATATION & CURRETTAGE/HYSTEROSCOPY WITH RESECTOCOPE N/A 11/24/2012   Procedure: DILATATION & CURETTAGE/HYSTEROSCOPY WITH RESECTOCOPE;  Surgeon: Arloa Koh, MD;  Location: [facility];  Service: Gynecology;  Laterality: N/A;   ESSURE TUBAL LIGATION Left    TONSILLECTOMY  1969   WISDOM TOOTH EXTRACTION  1979   WRIST FRACTURE SURGERY Right 07/2012    There were no vitals filed for this visit.   Subjective Assessment - 09/09/20 1709     Subjective Last time had a lot of pain but much improvement today. N/T happens occasionally but not frequent.    Pertinent History anxiety, back pain, depression, R fibular fx 2017, GERD, HLD, hyperglycemia, HTN, MRSA 2007, osteoporosis, L plantar fasciits, R wrist fx surgery 2014    Diagnostic tests 07/26/20 cervical MRI: mild spinal stenosis at C5-6  and C6-7, moderate  right C3 and severe right C5 foraminal stenosis, with moderate left  C7 foraminal narrowing    Patient Stated Goals decrease pain    Currently in Pain? No/denies                               [facility] Adult PT Treatment/Exercise - 09/09/20 0001       Neck Exercises: Standing   Neck Retraction 10 reps;3 secs    Neck Retraction Limitations isometric with ball on wall    Wall Push Ups 10 reps    Wall Push Ups Limitations serratus push up    Upper Extremity D1 Flexion;10 reps;Weights    UE D1 Weights (lbs) 2    Upper Extremity D2 Flexion;10 reps;Weights    UE D2 Weights (lbs) 2      Shoulder Exercises: Standing   External Rotation Strengthening;Both;10 reps;Theraband    Theraband Level (Shoulder External Rotation) Level 2  (Red)    External Rotation Limitations 10x3"      Manual Therapy   Manual Therapy Soft tissue mobilization;Myofascial release;Manual Traction    Manual therapy comments sitting    Soft tissue mobilization STM to L UT, LS    Myofascial Release manual TPR to L UT, LS,                      PT Short Term Goals - 08/28/20 1745       PT SHORT TERM GOAL #1   Title Patient to be independent with initial HEP.    Time 3    Period Weeks    Status Achieved    Target Date 09/04/20               PT Long Term Goals - 09/09/20 1738       PT LONG TERM GOAL #1   Title Patient to be independent with advanced HEP.    Time 6    Period Weeks    Status On-going      PT LONG TERM GOAL #2   Title Patient to demonstrate B shoulder strength >/=4+/5.    Time 6    Period Weeks    Status On-going      PT LONG TERM GOAL #3   Title Patient to demonstrate cervical AROM WFL and without pain limiting.    Time 6    Period Weeks    Status On-going      PT LONG TERM GOAL #4   Title Patient to report 75% improvement in tolerance for household lifitng activities.    Time 8    Period Weeks    Status On-going      PT LONG TERM GOAL #5   Title Patient to report centralization of pain to the cervical spine.    Time 6    Period Weeks    Status On-going   intermittent N/T randomly                  Plan - 09/09/20 1758     Clinical Impression Statement Pt responded well to treatment. She still experiences N/T ocassionally with no known cause. We reviewed an ulnar nerve glide today and she had N/T but tolerated it fairly. Continued with postural exercises and shoulder stability exercises with no new complaints from her. She notes much improvement from [facility] but soreness 2-3 days post, which we discussed is normal. She demonstrated increased muscle tension  and TTP in the L UT, LS muscles. Pt responded well and would continue to benefit from PT to restore function.    Personal  Factors and Comorbidities Age;Comorbidity 3+;Past/Current Experience;Profession;Time since onset of injury/illness/exacerbation    Comorbidities anxiety, back pain, depression, R fibular fx 2017, GERD, HLD, hyperglycemia, HTN, MRSA 2007, osteoporosis, L plantar fasciits, R wrist fx surgery 2014    PT Frequency 1x / week    PT Duration 6 weeks    PT Treatment/Interventions ADLs/Self Care Home Management;Cryotherapy;Electrical Stimulation;Moist Heat;Traction;Therapeutic exercise;Therapeutic activities;Functional mobility training;Ultrasound;Neuromuscular re-education;Patient/family education;Manual techniques;Taping;Energy conservation;Dry needling;Passive range of motion    PT Next Visit Plan progress postural strengthening; cervical stretching to tolerance    Consulted and Agree with Plan of Care Patient             Patient will benefit from skilled therapeutic intervention in order to improve the following deficits and impairments:  Hypomobility, Decreased activity tolerance, Decreased strength, Increased fascial restricitons, Impaired UE functional use, Pain, Increased muscle spasms, Improper body mechanics, Decreased range of motion, Postural dysfunction, Impaired flexibility  Visit Diagnosis: Radiculopathy, cervical region  Paresthesia of skin  Abnormal posture     Problem List Patient Active Problem List   Diagnosis Date Noted   Insulin resistance 04/10/2018   Class 3 severe obesity with serious comorbidity and body mass index (BMI) of 40.0 to 44.9 in adult [facility]) 04/10/2018   Osteoarthritis    Fracture of right fibula 05/19/2015   h/o Hyperglycemia 05/19/2015   Bursitis of shoulder 11/05/2013   Cervical cancer screening 07/20/2012   BMI 40.0-44.9, adult ([place]) 02/12/2012   Leg swelling 12/08/2011   Annual physical exam 07/28/2011   Menstrual irregularity 12/15/2010   Vitamin D deficiency 12/15/2010   h/o OSA (obstructive sleep apnea) 08/11/2010   Arthralgia 07/28/2010    Hyperlipidemia 07/28/2010   Hypertension 07/28/2010   Depression, recurrent ([place]) 07/28/2010    Artist Pais, PTA 09/09/2020, 6:07 PM  [facility] [address]  [place], [place], [address] Phone: [phone]   Fax:  [phone]  Name: Alejandra Miller MRN: [id number] Date of Birth: 22-Nov-1960   PHYSICAL THERAPY DISCHARGE SUMMARY  Visits from Start of Care: 5  Current functional level related to goals / functional outcomes: Unable to assess; patient cancelled last appointment d/t illness   Remaining deficits: Unable to assess   Education / Equipment: HEP Plan: Patient agrees to discharge.  Patient goals were not met. Patient is being discharged due to not returning.         Janene Harvey, PT, DPT 10/15/20 4:28 PM

## 2020-09-10 ENCOUNTER — Other Ambulatory Visit: Payer: Self-pay | Admitting: Obstetrics and Gynecology

## 2020-09-10 DIAGNOSIS — Z1231 Encounter for screening mammogram for malignant neoplasm of breast: Secondary | ICD-10-CM

## 2020-09-11 ENCOUNTER — Other Ambulatory Visit: Payer: Self-pay

## 2020-09-12 ENCOUNTER — Encounter: Payer: Self-pay | Admitting: Family Medicine

## 2020-09-12 ENCOUNTER — Ambulatory Visit (INDEPENDENT_AMBULATORY_CARE_PROVIDER_SITE_OTHER): Payer: No Typology Code available for payment source | Admitting: Family Medicine

## 2020-09-12 DIAGNOSIS — F339 Major depressive disorder, recurrent, unspecified: Secondary | ICD-10-CM

## 2020-09-12 MED ORDER — VILAZODONE HCL 40 MG PO TABS: 40.0000 mg | ORAL_TABLET | Freq: Every day | ORAL | 1 refills | Status: DC

## 2020-09-12 NOTE — Progress Notes (Signed)
Alejandra Miller is a 60 y.o. female  Chief Complaint  Patient presents with   Medication Refill    Pt here for a medication follow up. Pt need refill on her Vibryd    HPI: Alejandra Miller is a 60 y.o. female patient who has been following with ortho Dr. Ninfa Linden for cervical radiculopathy and cervicalgia. She is doing PT as well. Her neck and shoulder have improved.  She is also seen today for refill of her viibryd which she takes for recurrent depression. She feels the med is effective. No side effects. Sleeping ok, appetite is good. Pt is doing Weight Watchers and is down 13lbs. PHQ-9 = 2 GAD-7 = 3  Past Medical History:  Diagnosis Date   Anxiety    NO MEDS   Arthritis    Back pain    Cervical cancer screening 07/20/2012   Menarche at 13, regular til recently No h/o abnormal paps, last pap 2-3 years ago G0P0  MGM today, no h/o abnormal MGMs Spotting now monthly and scant H/o uterine polyps   Condyloma acuminata 2020   Depression    counseling in 2010 for 2 months on medication   Fibrocystic breast 1988   Fibroids 2014   Fracture of right fibula 05/19/2015   October 2016   GERD (gastroesophageal reflux disease) 2005   treated with omeprazole   High cholesterol 2002   History of sinusitis    once/twice per year   Hyperglycemia 05/19/2015   Hypertension 2002   Joint pain    MRSA (methicillin resistant staph aureus) culture positive 2007   Muscular pain    Obesity    OSA (obstructive sleep apnea)    +sleep study 08/2010., DOES NOT USE CPAP, USES MOSES MOUTH PIECE   Osteoarthritis    KNEES, HANDS, HIPS   Osteoporosis    Plantar fasciitis of left foot 03/2014   Seasonal allergies    affected in the spring   Sleep apnea    mouthpiece "[facility]"   Uterine polyp    Uterine polyp 05/19/2015   Recurrent Managed by Dr Rolly Pancake   Vitamin D deficiency    Wrist fracture 07/2012   right    Past Surgical History:  Procedure Laterality Date   DILATATION & CURETTAGE/HYSTEROSCOPY  WITH MYOSURE N/A 07/30/2014   Procedure: DILATATION & CURETTAGE/HYSTEROSCOPY WITH MYOSURE;  Surgeon: Nunzio Cobbs, MD;  Location: [facility];  Service: Gynecology;  Laterality: N/A;  extra time for BMI 49.   DILATATION & CURRETTAGE/HYSTEROSCOPY WITH RESECTOCOPE N/A 11/24/2012   Procedure: DILATATION & CURETTAGE/HYSTEROSCOPY WITH RESECTOCOPE;  Surgeon: Arloa Koh, MD;  Location: [facility];  Service: Gynecology;  Laterality: N/A;   ESSURE TUBAL LIGATION Left    TONSILLECTOMY  1969   WISDOM TOOTH EXTRACTION  1979   WRIST FRACTURE SURGERY Right 07/2012    Social History   Socioeconomic History   Marital status: Married    Spouse name: Mikiyah Glasner   Number of children: Not on file   Years of education: Not on file   Highest education level: Not on file  Occupational History   Not on file  Tobacco Use   Smoking status: Former    Packs/day: 0.50    Years: 6.00    Pack years: 3.00    Types: Cigarettes    Quit date: 03/01/1989    Years since quitting: 31.5   Smokeless tobacco: Never   Tobacco comments:    Quit in 1991- started in 1983 1/2ppd  Vaping Use   Vaping Use: Never used  Substance and Sexual Activity   Alcohol use: Yes    Alcohol/week: 0.0 standard drinks    Comment: 2 drinks/month   Drug use: No   Sexual activity: Not Currently    Partners: Male    Birth control/protection: Other-see comments, I.U.D.    Comment: vasectomy/Mirena IUD inserted 03-04-16  Other Topics Concern   Not on file  Social History Narrative   Not on file   Social Determinants of Health   Financial Resource Strain: Not on file  Food Insecurity: Not on file  Transportation Needs: Not on file  Physical Activity: Not on file  Stress: Not on file  Social Connections: Not on file  Intimate Partner Violence: Not on file    Family History  Problem Relation Age of Onset   Osteoarthritis Mother    Hyperlipidemia Mother    Depression Mother    Alzheimer's disease Mother    Hypertension Mother     Sleep apnea Mother    Obesity Mother    Liver cancer Maternal Grandmother        mets to breast   Breast cancer Maternal Grandmother        liver mets to breast   Cancer Maternal Grandmother        with mets   Hyperlipidemia Father    Coronary artery disease Father    Hypertension Father    Stroke Father        multiple   Diabetes Father    Obesity Father    Sleep apnea Father    Colon cancer Neg Hx      Immunization History  Administered Date(s) Administered   Influenza Split 12/06/2011   Influenza Whole 01/02/2013   Influenza,inj,Quad PF,6+ Mos 11/02/2013, 11/23/2016, 12/10/2017, 10/26/2018   Influenza-Unspecified 12/04/2014, 03/03/2016, 11/23/2016, 12/10/2017   PFIZER(Purple Top)SARS-COV-2 Vaccination 10/14/2019, 11/09/2019   Pneumococcal Conjugate-13 01/22/2013   Tdap 03/05/2002, 08/02/2012, 01/22/2013    Outpatient Encounter Medications as of 09/12/2020  Medication Sig Note   busPIRone (BUSPAR) 7.5 MG tablet Take 1 tablet (7.5 mg total) by mouth 2 (two) times daily.    CALCIUM PO Take 1 tablet by mouth 2 (two) times daily.     Coenzyme Q10 (CO Q 10 PO) Take by mouth daily.    cyclobenzaprine (FLEXERIL) 10 MG tablet Take 1 tablet (10 mg total) by mouth daily as needed.    gabapentin (NEURONTIN) 300 MG capsule Take 1 capsule (300 mg total) by mouth at bedtime.    levonorgestrel (MIRENA) 20 MCG/24HR IUD 1 each by Intrauterine route once.    lisinopril-hydrochlorothiazide (ZESTORETIC) 20-25 MG tablet Take 1 tablet by mouth daily.    LORazepam (ATIVAN) 1 MG tablet 1/2 to 1 tab po daily PRN anxiety    MAGNESIUM PO Take 500 mg by mouth.    Melatonin 3 MG TABS Take 1 tablet by mouth at bedtime.    meloxicam (MOBIC) 15 MG tablet TAKE ONE TABLET BY MOUTH ONE TIME DAILY    montelukast (SINGULAIR) 10 MG tablet Take 1 tablet (10 mg total) by mouth at bedtime.    Multiple Vitamin (MULTIVITAMIN) tablet Take 1 tablet by mouth daily.    mupirocin cream (BACTROBAN) 2 % Apply 1  application topically as needed.    Omega-3 Fatty Acids (FISH OIL) 1000 MG CAPS Take 1,000 mg by mouth daily.    omeprazole (PRILOSEC) 20 MG capsule Take 20 mg by mouth daily as needed.    oxybutynin (DITROPAN XL) 15  MG 24 hr tablet Take one tablet (15 mg) by mouth daily.    POTASSIUM PO Take 99 mg by mouth.    Probiotic Product (PROBIOTIC PO) Take by mouth.    sodium chloride (OCEAN) 0.65 % SOLN nasal spray Place 1 spray into both nostrils as needed for congestion.    triamcinolone (NASACORT) 55 MCG/ACT AERO nasal inhaler Place 2 sprays into the nose daily. 02/27/2020: prn   TURMERIC PO Take by mouth.    Vilazodone HCl (VIIBRYD) 40 MG TABS Take 1 tablet (40 mg total) by mouth daily.    Vitamin D, Ergocalciferol, (DRISDOL) 1.25 MG (50000 UT) CAPS capsule Take 1 capsule (50,000 Units total) by mouth every 7 (seven) days.    [DISCONTINUED] VIIBRYD 40 MG TABS TAKE ONE TABLET BY MOUTH ONE TIME DAILY    guaiFENesin-codeine (ROBITUSSIN AC) 100-10 MG/5ML syrup Take 5 mLs by mouth 3 (three) times daily as needed for cough. (Patient not taking: No sig reported)    ibuprofen (ADVIL) 800 MG tablet Take 1 tablet (800 mg total) by mouth every 8 (eight) hours as needed. (Patient not taking: Reported on 09/12/2020)    [DISCONTINUED] predniSONE (DELTASONE) 20 MG tablet 3 tabs po x 4 days, 2 tabs po x 4 days, then 1 tab daily x 4 days, 1/2 tab po daily x 4 days (Patient not taking: Reported on 08/14/2020)    No facility-administered encounter medications on file as of 09/12/2020.     ROS: Pertinent positives and negatives noted in HPI. Remainder of ROS non-contributory    Allergies  Allergen Reactions   Bupropion Other (See Comments)    Makes her feel devoid of emotions    BP 110/88 (BP Location: Left Arm, Patient Position: Sitting, Cuff Size: Large)   Pulse 81   Temp (!) 97 F (36.1 C) (Temporal)   Ht 5\' 3"  (1.6 m)   Wt 236 lb 12.8 oz (107.4 kg)   SpO2 98%   BMI 41.95 kg/m  Wt Readings from Last  3 Encounters:  09/12/20 236 lb 12.8 oz (107.4 kg)  08/04/20 242 lb 3.2 oz (109.9 kg)  06/06/20 247 lb 6.4 oz (112.2 kg)   Temp Readings from Last 3 Encounters:  09/12/20 (!) 97 F (36.1 C) (Temporal)  06/06/20 97.6 F (36.4 C) (Temporal)  02/27/20 97.7 F (36.5 C) (Temporal)   BP Readings from Last 3 Encounters:  09/12/20 110/88  06/06/20 140/90  02/27/20 136/80   Pulse Readings from Last 3 Encounters:  09/12/20 81  06/06/20 89  02/27/20 (!) 102     Physical Exam Constitutional:      General: She is not in acute distress.    Appearance: Normal appearance. She is not ill-appearing.  Cardiovascular:     Rate and Rhythm: Normal rate and regular rhythm.     Pulses: Normal pulses.  Pulmonary:     Effort: No respiratory distress.  Neurological:     Mental Status: She is alert and oriented to person, place, and time.  Psychiatric:        Mood and Affect: Mood normal.        Behavior: Behavior normal.     A/P:  1. Depression, recurrent ([place]) - well-controlled - PHQ-9 = 2, GAD-7 = 3 Refill: - Vilazodone HCl (VIIBRYD) 40 MG TABS; Take 1 tablet (40 mg total) by mouth daily.  Dispense: 90 tablet; Refill: 1   This visit occurred during the SARS-CoV-2 public health emergency.  Safety protocols were in place, including screening questions prior  to the visit, additional usage of staff PPE, and extensive cleaning of exam room while observing appropriate contact time as indicated for disinfecting solutions.

## 2020-09-17 ENCOUNTER — Ambulatory Visit: Payer: No Typology Code available for payment source

## 2020-09-23 ENCOUNTER — Encounter: Payer: Self-pay | Admitting: Physical Therapy

## 2020-10-07 ENCOUNTER — Other Ambulatory Visit: Payer: Self-pay

## 2020-10-07 ENCOUNTER — Encounter: Payer: Self-pay | Admitting: Orthopaedic Surgery

## 2020-10-07 ENCOUNTER — Ambulatory Visit
Admission: RE | Admit: 2020-10-07 | Discharge: 2020-10-07 | Disposition: A | Discharge status: Home / Self Care | Payer: No Typology Code available for payment source | Source: Ambulatory Visit | Attending: Obstetrics and Gynecology | Admitting: Obstetrics and Gynecology

## 2020-10-07 ENCOUNTER — Ambulatory Visit (INDEPENDENT_AMBULATORY_CARE_PROVIDER_SITE_OTHER): Payer: No Typology Code available for payment source | Admitting: Orthopaedic Surgery

## 2020-10-07 DIAGNOSIS — M5412 Radiculopathy, cervical region: Secondary | ICD-10-CM | POA: Diagnosis not present

## 2020-10-07 DIAGNOSIS — Z1231 Encounter for screening mammogram for malignant neoplasm of breast: Secondary | ICD-10-CM

## 2020-10-07 DIAGNOSIS — M542 Cervicalgia: Secondary | ICD-10-CM

## 2020-10-07 MED ORDER — GABAPENTIN 300 MG PO CAPS: 300.0000 mg | ORAL_CAPSULE | Freq: Every day | ORAL | 6 refills | Status: DC

## 2020-10-07 MED ORDER — MELOXICAM 15 MG PO TABS: 15.0000 mg | ORAL_TABLET | Freq: Every day | ORAL | 6 refills | Status: DC | PRN

## 2020-10-07 NOTE — Progress Notes (Signed)
The patient is continue to follow-up as it relates to cervalgia with radicular symptoms down her left upper extremity.  MRI of her cervical spine showed multilevel cervical spondylosis with stenosis at C5-C6 and C6-C7 but the most notable findings were severe C5 foraminal stenosis and moderate C7 foraminal narrowing.  She has been through extensive physical therapy for her neck as well as traction.  She does have a home traction device as well that she uses once a week.  She reports significant improvement in her symptoms.  She also also been on Neurontin in the evening and meloxicam.  She does need refills of these.  She has good range of motion of her cervical spine and excellent strength of her left upper extremity with no radicular symptoms today.  As of now, she is transition to home exercise program and she is doing well overall.  She will continue the Neurontin and meloxicam intermittently and I will refill these.  Follow-up can now be as needed unless things worsen for her.  All questions and concerns were answered and addressed.

## 2020-10-09 ENCOUNTER — Encounter: Payer: Self-pay | Admitting: Nurse Practitioner

## 2020-11-28 ENCOUNTER — Other Ambulatory Visit: Payer: Self-pay

## 2020-11-28 DIAGNOSIS — I10 Essential (primary) hypertension: Secondary | ICD-10-CM

## 2020-11-28 DIAGNOSIS — J3089 Other allergic rhinitis: Secondary | ICD-10-CM

## 2020-12-01 MED ORDER — MONTELUKAST SODIUM 10 MG PO TABS: 10.0000 mg | ORAL_TABLET | Freq: Every day | ORAL | 0 refills | Status: DC

## 2020-12-01 MED ORDER — LISINOPRIL-HYDROCHLOROTHIAZIDE 20-25 MG PO TABS: 1.0000 | ORAL_TABLET | Freq: Every day | ORAL | 0 refills | Status: DC

## 2020-12-15 ENCOUNTER — Encounter: Payer: No Typology Code available for payment source | Admitting: Nurse Practitioner

## 2020-12-22 NOTE — Progress Notes (Signed)
60 y.o. G0P0 Married Caucasian female here for annual exam.    Had very light spotting one day in August 18th and one day in mid September. She was under stress at the time of the spotting episodes.   Lost 25 pounds since April.   Followed for overactive bladder.  Takes Ditropan XL 15 mg daily.  Wants to continue.   Having issues with C5 and C6, nerve compression.   PCP:  Wilfred Lacy, MD   No LMP recorded. (Menstrual status: IUD).           Sexually active: No.  The current method of family planning is vasectomy/Mirena IUD 03/04/16.    Exercising: No.  The patient does not participate in regular exercise at present. Smoker:  no  Health Maintenance: Pap: 12-03-16 Neg:Neg HR HPV, 10-12-13 Neg:neg HR HPV, Pap 07/20/12 - normal.  No HR HPV done.  History of abnormal Pap:  no MMG: 10-07-20  3D/Neg/Birads1 Colonoscopy:  2015 normal;next 2025 BMD: 08/11/20 - normal.   Repeat BMD in 2 years--[facility] TDaP: 2014 Gardasil:   no HIV:2006 NR Hep C: 05-19-15 Neg Screening Labs:  PCP.   reports that she quit smoking about 31 years ago. Her smoking use included cigarettes. She has a 3.00 pack-year smoking history. She has never used smokeless tobacco. She reports current alcohol use. She reports that she does not use drugs.  Past Medical History:  Diagnosis Date   Anxiety    NO MEDS   Arthritis    Back pain    Cervical cancer screening 07/20/2012   Menarche at 13, regular til recently No h/o abnormal paps, last pap 2-3 years ago G0P0  MGM today, no h/o abnormal MGMs Spotting now monthly and scant H/o uterine polyps   Condyloma acuminata 2020   Depression    counseling in 2010 for 2 months on medication   Fibrocystic breast 1988   Fibroids 2014   Fracture of right fibula 05/19/2015   October 2016   GERD (gastroesophageal reflux disease) 2005   treated with omeprazole   High cholesterol 2002   History of sinusitis    once/twice per year   Hyperglycemia 05/19/2015   Hypertension 2002    Joint pain    MRSA (methicillin resistant staph aureus) culture positive 2007   Muscular pain    Obesity    OSA (obstructive sleep apnea)    +sleep study 08/2010., DOES NOT USE CPAP, USES MOSES MOUTH PIECE   Osteoarthritis    KNEES, HANDS, HIPS   Osteoporosis    Plantar fasciitis of left foot 03/2014   Seasonal allergies    affected in the spring   Sleep apnea    mouthpiece "[facility]"   Uterine polyp    Uterine polyp 05/19/2015   Recurrent Managed by Dr Rolly Pancake   Vitamin D deficiency    Wrist fracture 07/2012   right    Past Surgical History:  Procedure Laterality Date   DILATATION & CURETTAGE/HYSTEROSCOPY WITH MYOSURE N/A 07/30/2014   Procedure: DILATATION & CURETTAGE/HYSTEROSCOPY WITH MYOSURE;  Surgeon: Nunzio Cobbs, MD;  Location: [facility];  Service: Gynecology;  Laterality: N/A;  extra time for BMI 49.   DILATATION & CURRETTAGE/HYSTEROSCOPY WITH RESECTOCOPE N/A 11/24/2012   Procedure: DILATATION & CURETTAGE/HYSTEROSCOPY WITH RESECTOCOPE;  Surgeon: Arloa Koh, MD;  Location: [facility];  Service: Gynecology;  Laterality: N/A;   ESSURE TUBAL LIGATION Left    TONSILLECTOMY  1969   WISDOM TOOTH EXTRACTION  1979   WRIST FRACTURE  SURGERY Right 07/2012    Current Outpatient Medications  Medication Sig Dispense Refill   busPIRone (BUSPAR) 7.5 MG tablet Take 1 tablet (7.5 mg total) by mouth 2 (two) times daily. 180 tablet 0   CALCIUM PO Take 1 tablet by mouth 2 (two) times daily.      Coenzyme Q10 (CO Q 10 PO) Take by mouth daily.     cyclobenzaprine (FLEXERIL) 10 MG tablet Take 1 tablet (10 mg total) by mouth daily as needed. 90 tablet 1   gabapentin (NEURONTIN) 300 MG capsule Take 1 capsule (300 mg total) by mouth at bedtime. 60 capsule 6   levonorgestrel (MIRENA) 20 MCG/24HR IUD 1 each by Intrauterine route once.     lisinopril-hydrochlorothiazide (ZESTORETIC) 20-25 MG tablet Take 1 tablet by mouth daily. 90 tablet 0   LORazepam (ATIVAN) 1 MG tablet 1/2 to 1 tab po daily  PRN anxiety 30 tablet 1   MAGNESIUM PO Take 500 mg by mouth.     Melatonin 3 MG TABS Take 1 tablet by mouth at bedtime.     meloxicam (MOBIC) 15 MG tablet Take 1 tablet (15 mg total) by mouth daily as needed for pain. 30 tablet 6   montelukast (SINGULAIR) 10 MG tablet Take 1 tablet (10 mg total) by mouth at bedtime. 90 tablet 0   Multiple Vitamin (MULTIVITAMIN) tablet Take 1 tablet by mouth daily.     mupirocin cream (BACTROBAN) 2 % Apply 1 application topically as needed. 15 g 1   Omega-3 Fatty Acids (FISH OIL) 1000 MG CAPS Take 1,000 mg by mouth daily.     omeprazole (PRILOSEC) 20 MG capsule Take 20 mg by mouth daily as needed.     oxybutynin (DITROPAN XL) 15 MG 24 hr tablet Take one tablet (15 mg) by mouth daily. 90 tablet 0   POTASSIUM PO Take 99 mg by mouth.     Probiotic Product (PROBIOTIC PO) Take by mouth.     sodium chloride (OCEAN) 0.65 % SOLN nasal spray Place 1 spray into both nostrils as needed for congestion. 15 mL 0   triamcinolone (NASACORT) 55 MCG/ACT AERO nasal inhaler Place 2 sprays into the nose daily.     TURMERIC PO Take by mouth.     Vilazodone HCl (VIIBRYD) 40 MG TABS Take 1 tablet (40 mg total) by mouth daily. 90 tablet 1   Vitamin D, Ergocalciferol, (DRISDOL) 1.25 MG (50000 UT) CAPS capsule Take 1 capsule (50,000 Units total) by mouth every 7 (seven) days. 12 capsule 3   No current facility-administered medications for this visit.    Family History  Problem Relation Age of Onset   Osteoarthritis Mother    Hyperlipidemia Mother    Depression Mother    Alzheimer's disease Mother    Hypertension Mother    Sleep apnea Mother    Obesity Mother    Liver cancer Maternal Grandmother        mets to breast   Breast cancer Maternal Grandmother        liver mets to breast   Cancer Maternal Grandmother        with mets   Hyperlipidemia Father    Coronary artery disease Father    Hypertension Father    Stroke Father        multiple   Diabetes Father    Obesity  Father    Sleep apnea Father    Colon cancer Neg Hx     Review of Systems  All other systems reviewed and  are negative.  Exam:   BP 124/86   Pulse 88   Ht 5' 3.25" (1.607 m)   Wt 226 lb (102.5 kg)   SpO2 98%   BMI 39.72 kg/m     General appearance: alert, cooperative and appears stated age Head: normocephalic, without obvious abnormality, atraumatic Neck: no adenopathy, supple, symmetrical, trachea midline and thyroid normal to inspection and palpation Lungs: clear to auscultation bilaterally Breasts: normal appearance, no masses or tenderness, No nipple retraction or dimpling, No nipple discharge or bleeding, No axillary adenopathy Heart: regular rate and rhythm Abdomen: soft, non-tender; no masses, no organomegaly Extremities: extremities normal, atraumatic, no cyanosis or edema Skin: skin color, texture, turgor normal. No rashes or lesions Lymph nodes: cervical, supraclavicular, and axillary nodes normal. Neurologic: grossly normal  Pelvic: External genitalia:  no lesions              No abnormal inguinal nodes palpated.              Urethra:  normal appearing urethra with no masses, tenderness or lesions              Bartholins and Skenes: normal                 Vagina: normal appearing vagina with normal color and discharge, no lesions              Cervix: no lesions.  IUD strings noted.               Pap taken: yes Bimanual Exam:  Uterus:  normal size, contour, position, consistency, mobility, non-tender              Adnexa: no mass, fullness, tenderness              Rectal exam: yes.  Confirms.              Anus:  normal sphincter tone, no lesions  Chaperone was present for exam:  Estill Bamberg, CMA.  Assessment:   Well woman visit with gynecologic exam. Mirena IUD.  Placed 03/04/16.  Ok until 03/04/24.   Perimenopausal bleeding Has unilateral Essure. Overactive bladder.  HTN.  Hx anxiety.  Osteoporosis dx due to fragility fractures. Off Forteo. Status post Reclast.   Follow[facility].  Taking 50,000 IU weekly.  Successful weight loss.   Plan: Mammogram screening discussed. Self breast awareness reviewed. Pap and HR HPV as above. Guidelines for Calcium, Vitamin D, regular exercise program including cardiovascular and weight bearing exercise. Check FSH and estradiol. Refill of Ditropan XL 15 mg daily.  Follow up annually and prn.   After visit summary provided.

## 2020-12-23 ENCOUNTER — Ambulatory Visit (INDEPENDENT_AMBULATORY_CARE_PROVIDER_SITE_OTHER): Payer: No Typology Code available for payment source | Admitting: Obstetrics and Gynecology

## 2020-12-23 ENCOUNTER — Other Ambulatory Visit: Payer: Self-pay

## 2020-12-23 ENCOUNTER — Other Ambulatory Visit (HOSPITAL_COMMUNITY)
Admission: RE | Admit: 2020-12-23 | Discharge: 2020-12-23 | Disposition: A | Discharge status: Home / Self Care | Payer: No Typology Code available for payment source | Source: Ambulatory Visit | Attending: Obstetrics and Gynecology | Admitting: Obstetrics and Gynecology

## 2020-12-23 ENCOUNTER — Encounter: Payer: Self-pay | Admitting: Obstetrics and Gynecology

## 2020-12-23 VITALS — BP 124/86 | HR 88 | Ht 63.25 in | Wt 226.0 lb

## 2020-12-23 DIAGNOSIS — Z01419 Encounter for gynecological examination (general) (routine) without abnormal findings: Secondary | ICD-10-CM | POA: Insufficient documentation

## 2020-12-23 DIAGNOSIS — N924 Excessive bleeding in the premenopausal period: Secondary | ICD-10-CM

## 2020-12-23 MED ORDER — OXYBUTYNIN CHLORIDE ER 15 MG PO TB24: ORAL_TABLET | ORAL | 3 refills | Status: DC

## 2020-12-23 NOTE — Patient Instructions (Signed)

## 2020-12-24 LAB — CYTOLOGY - PAP
Comment: NEGATIVE
Diagnosis: NEGATIVE
High risk HPV: NEGATIVE

## 2020-12-24 LAB — ESTRADIOL: Estradiol: 21 pg/mL

## 2020-12-24 LAB — FOLLICLE STIMULATING HORMONE: FSH: 57 m[IU]/mL

## 2020-12-31 ENCOUNTER — Other Ambulatory Visit: Payer: Self-pay

## 2020-12-31 DIAGNOSIS — N950 Postmenopausal bleeding: Secondary | ICD-10-CM

## 2021-01-05 ENCOUNTER — Encounter: Payer: Self-pay | Admitting: Nurse Practitioner

## 2021-01-05 ENCOUNTER — Other Ambulatory Visit: Payer: Self-pay

## 2021-01-05 ENCOUNTER — Ambulatory Visit (INDEPENDENT_AMBULATORY_CARE_PROVIDER_SITE_OTHER): Payer: No Typology Code available for payment source | Admitting: Nurse Practitioner

## 2021-01-05 VITALS — BP 136/84 | HR 76 | Temp 97.1°F | Ht 63.25 in | Wt 228.4 lb

## 2021-01-05 DIAGNOSIS — E7849 Other hyperlipidemia: Secondary | ICD-10-CM | POA: Diagnosis not present

## 2021-01-05 DIAGNOSIS — I10 Essential (primary) hypertension: Secondary | ICD-10-CM

## 2021-01-05 DIAGNOSIS — F3342 Major depressive disorder, recurrent, in full remission: Secondary | ICD-10-CM

## 2021-01-05 DIAGNOSIS — N939 Abnormal uterine and vaginal bleeding, unspecified: Secondary | ICD-10-CM | POA: Insufficient documentation

## 2021-01-05 DIAGNOSIS — J3089 Other allergic rhinitis: Secondary | ICD-10-CM

## 2021-01-05 DIAGNOSIS — Z23 Encounter for immunization: Secondary | ICD-10-CM

## 2021-01-05 DIAGNOSIS — F411 Generalized anxiety disorder: Secondary | ICD-10-CM | POA: Insufficient documentation

## 2021-01-05 DIAGNOSIS — R739 Hyperglycemia, unspecified: Secondary | ICD-10-CM

## 2021-01-05 DIAGNOSIS — E559 Vitamin D deficiency, unspecified: Secondary | ICD-10-CM

## 2021-01-05 MED ORDER — BUSPIRONE HCL 7.5 MG PO TABS: 7.5000 mg | ORAL_TABLET | Freq: Two times a day (BID) | ORAL | 3 refills | Status: DC

## 2021-01-05 MED ORDER — MONTELUKAST SODIUM 10 MG PO TABS: 10.0000 mg | ORAL_TABLET | Freq: Every day | ORAL | 3 refills | Status: DC

## 2021-01-05 MED ORDER — LISINOPRIL-HYDROCHLOROTHIAZIDE 20-25 MG PO TABS: 1.0000 | ORAL_TABLET | Freq: Every day | ORAL | 1 refills | Status: DC

## 2021-01-05 MED ORDER — VILAZODONE HCL 40 MG PO TABS: 40.0000 mg | ORAL_TABLET | Freq: Every day | ORAL | 3 refills | Status: DC

## 2021-01-05 NOTE — Assessment & Plan Note (Signed)
Followed by Dr. Judeth Horn Abnormal uterine bleeding, scheduled for vaginal [name] and possible biopsy IUD-Mirena in place at this time. 03/2016 Hx of uterine polyp.

## 2021-01-05 NOTE — Assessment & Plan Note (Signed)
Stable mood  use of lorazepam sparingly Last filled 03/28/20 and 04/24/2020 (#30 each)

## 2021-01-05 NOTE — Assessment & Plan Note (Addendum)
BP at goal with lisinopril/hctz  BP Readings from Last 3 Encounters:  01/05/21 136/84  12/23/20 124/86  09/12/20 110/88   Repeat CMP Refill sent

## 2021-01-05 NOTE — Progress Notes (Signed)
Subjective:  Patient ID: Alejandra Miller, female    DOB: 05/01/1960  Age: 60 y.o. MRN: [id number]  CC: Establish Care (TOC-Dr. Cirigliano/ 3 month f/u on depression. )  HPI Transfer care from Dr. Bryan Miller  h/o OSA (obstructive sleep apnea) Use of oral applianced via dentist Sleep study completed 1week ago by Alejandra Miller Request report  Depression Stable mood with viibryd and buspar Previous appts with therapist, no need at this time Refill sent  GAD (generalized anxiety disorder) Stable mood  use of lorazepam sparingly Last filled 03/28/20 and 04/24/2020 (#30 each)  Chronic neck pain Stable with use of gabapentin, meloxicam and flexeril Managed by ortho-spine: Dr. Ninfa Miller  Mixed hyperlipidemia Repeat lipid panel  Essential hypertension BP at goal with lisinopril/hctz  BP Readings from Last 3 Encounters:  01/05/21 136/84  12/23/20 124/86  09/12/20 110/88   Repeat CMP Refill sent  Abnormal uterine bleeding Followed by Dr. Judeth Miller Abnormal uterine bleeding, scheduled for vaginal [name] and possible biopsy IUD-Mirena in place at this time. 03/2016 Hx of uterine polyp.  Reviewed past Medical, Social and Family history today.  Outpatient Medications Prior to Visit  Medication Sig Dispense Refill   CALCIUM PO Take 1 tablet by mouth 2 (two) times daily.      Coenzyme Q10 (CO Q 10 PO) Take by mouth daily.     cyclobenzaprine (FLEXERIL) 10 MG tablet Take 1 tablet (10 mg total) by mouth daily as needed. 90 tablet 1   gabapentin (NEURONTIN) 300 MG capsule Take 1 capsule (300 mg total) by mouth at bedtime. 60 capsule 6   levonorgestrel (MIRENA) 20 MCG/24HR IUD 1 each by Intrauterine route once.     LORazepam (ATIVAN) 1 MG tablet 1/2 to 1 tab po daily PRN anxiety 30 tablet 1   MAGNESIUM PO Take 500 mg by mouth.     Melatonin 3 MG TABS Take 1 tablet by mouth at bedtime.     meloxicam (MOBIC) 15 MG tablet Take 1 tablet (15 mg total) by mouth daily as needed for pain. 30  tablet 6   Multiple Vitamin (MULTIVITAMIN) tablet Take 1 tablet by mouth daily.     mupirocin cream (BACTROBAN) 2 % Apply 1 application topically as needed. 15 g 1   Omega-3 Fatty Acids (FISH OIL) 1000 MG CAPS Take 1,000 mg by mouth daily.     omeprazole (PRILOSEC) 20 MG capsule Take 20 mg by mouth daily as needed.     oxybutynin (DITROPAN XL) 15 MG 24 hr tablet Take one tablet (15 mg) by mouth daily. 90 tablet 3   POTASSIUM PO Take 99 mg by mouth.     Probiotic Product (PROBIOTIC PO) Take by mouth.     sodium chloride (OCEAN) 0.65 % SOLN nasal spray Place 1 spray into both nostrils as needed for congestion. 15 mL 0   triamcinolone (NASACORT) 55 MCG/ACT AERO nasal inhaler Place 2 sprays into the nose daily.     TURMERIC PO Take by mouth.     Vitamin D, Ergocalciferol, (DRISDOL) 1.25 MG (50000 UT) CAPS capsule Take 1 capsule (50,000 Units total) by mouth every 7 (seven) days. 12 capsule 3   busPIRone (BUSPAR) 7.5 MG tablet Take 1 tablet (7.5 mg total) by mouth 2 (two) times daily. 180 tablet 0   lisinopril-hydrochlorothiazide (ZESTORETIC) 20-25 MG tablet Take 1 tablet by mouth daily. 90 tablet 0   montelukast (SINGULAIR) 10 MG tablet Take 1 tablet (10 mg total) by mouth at bedtime. 90 tablet 0  Vilazodone HCl (VIIBRYD) 40 MG TABS Take 1 tablet (40 mg total) by mouth daily. 90 tablet 1   No facility-administered medications prior to visit.    ROS See HPI  Objective:  BP 136/84 (BP Location: Left Arm, Patient Position: Sitting, Cuff Size: Large)   Pulse 76   Temp (!) 97.1 F (36.2 C) (Temporal)   Ht 5' 3.25" (1.607 m)   Wt 228 lb 6.4 oz (103.6 kg)   SpO2 98%   BMI 40.14 kg/m   Physical Exam Vitals reviewed.  Cardiovascular:     Rate and Rhythm: Normal rate and regular rhythm.     Pulses: Normal pulses.     Heart sounds: Normal heart sounds.  Pulmonary:     Effort: Pulmonary effort is normal.     Breath sounds: Normal breath sounds.  Musculoskeletal:     Right lower leg: No  edema.     Left lower leg: No edema.  Neurological:     Mental Status: She is alert and oriented to person, place, and time.   Assessment & Plan:  This visit occurred during the SARS-CoV-2 public health emergency.  Safety protocols were in place, including screening questions prior to the visit, additional usage of staff PPE, and extensive cleaning of exam room while observing appropriate contact time as indicated for disinfecting solutions.   Alejandra Miller was seen today for establish care.  Diagnoses and all orders for this visit:  Recurrent major depressive disorder, in full remission (HCC) -     Vilazodone HCl (VIIBRYD) 40 MG TABS; Take 1 tablet (40 mg total) by mouth daily.  Flu vaccine need -     Flu Vaccine QUAD 6+ mos PF IM (Fluarix Quad PF)  Need for shingles vaccine -     Varicella-zoster vaccine IM  Other hyperlipidemia -     Lipid panel; Future -     Cancel: Hepatic function panel; Future -     Comprehensive metabolic panel; Future  GAD (generalized anxiety disorder) -     busPIRone (BUSPAR) 7.5 MG tablet; Take 1 tablet (7.5 mg total) by mouth 2 (two) times daily.  Essential hypertension -     lisinopril-hydrochlorothiazide (ZESTORETIC) 20-25 MG tablet; Take 1 tablet by mouth daily. -     Cancel: Basic metabolic panel; Future -     Comprehensive metabolic panel; Future  Non-seasonal allergic rhinitis, unspecified trigger -     montelukast (SINGULAIR) 10 MG tablet; Take 1 tablet (10 mg total) by mouth at bedtime.  Hyperglycemia -     Cancel: Hemoglobin A1c; Future -     Hemoglobin A1c; Future  Vitamin D deficiency -     Vitamin D (25 hydroxy); Future  Schedule lab appt. Need to be fasting 8hrs prior to blood draw. Ok to drink water Maintain medication dose Sign medical release form to get records from [facility].  Problem List Items Addressed This Visit       Cardiovascular and Mediastinum   Essential hypertension    BP at goal with lisinopril/hctz  BP  Readings from Last 3 Encounters:  01/05/21 136/84  12/23/20 124/86  09/12/20 110/88   Repeat CMP Refill sent      Relevant Medications   lisinopril-hydrochlorothiazide (ZESTORETIC) 20-25 MG tablet   Other Relevant Orders   Comprehensive metabolic panel     Other   Depression - Primary    Stable mood with viibryd and buspar Previous appts with therapist, no need at this time Refill sent  Relevant Medications   busPIRone (BUSPAR) 7.5 MG tablet   Vilazodone HCl (VIIBRYD) 40 MG TABS   GAD (generalized anxiety disorder)    Stable mood  use of lorazepam sparingly Last filled 03/28/20 and 04/24/2020 (#30 each)      Relevant Medications   busPIRone (BUSPAR) 7.5 MG tablet   Vilazodone HCl (VIIBRYD) 40 MG TABS   Mixed hyperlipidemia    Repeat lipid panel      Relevant Medications   lisinopril-hydrochlorothiazide (ZESTORETIC) 20-25 MG tablet   Other Visit Diagnoses     Flu vaccine need       Relevant Orders   Flu Vaccine QUAD 6+ mos PF IM (Fluarix Quad PF) (Completed)   Need for shingles vaccine       Relevant Orders   Varicella-zoster vaccine IM (Completed)   Non-seasonal allergic rhinitis, unspecified trigger       Relevant Medications   montelukast (SINGULAIR) 10 MG tablet   Hyperglycemia       Relevant Orders   Hemoglobin A1c   Vitamin D deficiency       Relevant Orders   Vitamin D (25 hydroxy)       Follow-up: Return in about 6 months (around 07/05/2021) for CPE (fasting).  Wilfred Lacy, NP

## 2021-01-05 NOTE — Assessment & Plan Note (Signed)
Repeat lipid panel ?

## 2021-01-05 NOTE — Patient Instructions (Signed)
Schedule lab appt. Need to be fasting 8hrs prior to blood draw. Ok to drink water Maintain medication dose Sign medical release form to get records from [facility].

## 2021-01-05 NOTE — Assessment & Plan Note (Addendum)
Stable with use of gabapentin, meloxicam and flexeril Managed by ortho-spine: Dr. Ninfa Linden

## 2021-01-05 NOTE — Assessment & Plan Note (Signed)
Use of oral applianced via dentist Sleep study completed 1week ago by Kane Request report

## 2021-01-05 NOTE — Assessment & Plan Note (Signed)
Stable mood with viibryd and buspar Previous appts with therapist, no need at this time Refill sent

## 2021-01-07 ENCOUNTER — Other Ambulatory Visit: Payer: Self-pay

## 2021-01-07 ENCOUNTER — Other Ambulatory Visit (INDEPENDENT_AMBULATORY_CARE_PROVIDER_SITE_OTHER): Payer: No Typology Code available for payment source

## 2021-01-07 DIAGNOSIS — E7849 Other hyperlipidemia: Secondary | ICD-10-CM

## 2021-01-07 DIAGNOSIS — R739 Hyperglycemia, unspecified: Secondary | ICD-10-CM

## 2021-01-07 DIAGNOSIS — I10 Essential (primary) hypertension: Secondary | ICD-10-CM

## 2021-01-07 DIAGNOSIS — E559 Vitamin D deficiency, unspecified: Secondary | ICD-10-CM | POA: Diagnosis not present

## 2021-01-07 LAB — COMPREHENSIVE METABOLIC PANEL
ALT: 21 U/L (ref 0–35)
AST: 18 U/L (ref 0–37)
Albumin: 4.3 g/dL (ref 3.5–5.2)
Alkaline Phosphatase: 58 U/L (ref 39–117)
BUN: 14 mg/dL (ref 6–23)
CO2: 32 mEq/L (ref 19–32)
Calcium: 9.6 mg/dL (ref 8.4–10.5)
Chloride: 102 mEq/L (ref 96–112)
Creatinine, Ser: 0.85 mg/dL (ref 0.40–1.20)
GFR: 74.77 mL/min (ref >60.00)
Glucose, Bld: 92 mg/dL (ref 70–99)
Potassium: 4 mEq/L (ref 3.5–5.1)
Sodium: 140 mEq/L (ref 135–145)
Total Bilirubin: 0.8 mg/dL (ref 0.2–1.2)
Total Protein: 6.5 g/dL (ref 6.0–8.3)

## 2021-01-07 LAB — LIPID PANEL
Cholesterol: 250 mg/dL — ABNORMAL HIGH (ref 0–200)
HDL: 38.2 mg/dL — ABNORMAL LOW (ref >39.00)
LDL Cholesterol: 189 mg/dL — ABNORMAL HIGH (ref 0–99)
NonHDL: 212.07
Total CHOL/HDL Ratio: 7
Triglycerides: 115 mg/dL (ref 0.0–149.0)
VLDL: 23 mg/dL (ref 0.0–40.0)

## 2021-01-07 LAB — VITAMIN D 25 HYDROXY (VIT D DEFICIENCY, FRACTURES): VITD: 34.16 ng/mL (ref 30.00–100.00)

## 2021-01-07 LAB — HEMOGLOBIN A1C: Hgb A1c MFr Bld: 5.5 % (ref 4.6–6.5)

## 2021-01-07 NOTE — Progress Notes (Signed)
Per the orders of Alejandra Ash NP patient is here for labs. Patient tolerated draw well.

## 2021-01-13 ENCOUNTER — Encounter: Payer: Self-pay | Admitting: Obstetrics and Gynecology

## 2021-01-13 ENCOUNTER — Other Ambulatory Visit: Payer: Self-pay

## 2021-01-13 ENCOUNTER — Ambulatory Visit (INDEPENDENT_AMBULATORY_CARE_PROVIDER_SITE_OTHER): Payer: No Typology Code available for payment source | Admitting: Obstetrics and Gynecology

## 2021-01-13 ENCOUNTER — Other Ambulatory Visit (HOSPITAL_COMMUNITY)
Admission: RE | Admit: 2021-01-13 | Discharge: 2021-01-13 | Disposition: A | Discharge status: Home / Self Care | Payer: No Typology Code available for payment source | Source: Ambulatory Visit | Attending: Obstetrics and Gynecology | Admitting: Obstetrics and Gynecology

## 2021-01-13 ENCOUNTER — Ambulatory Visit (INDEPENDENT_AMBULATORY_CARE_PROVIDER_SITE_OTHER): Payer: No Typology Code available for payment source

## 2021-01-13 DIAGNOSIS — N950 Postmenopausal bleeding: Secondary | ICD-10-CM

## 2021-01-13 NOTE — Progress Notes (Signed)
GYNECOLOGY  VISIT   HPI: 60 y.o.   Married  Caucasian  female   G0P0 with No LMP recorded. (Menstrual status: IUD).   here for pelvic ultrasound and EMB for postmenopausal bleeding.   She has a Mirena IUD due to abnormal perimenopausal bleeding. She has a history of recurrent endometrial polyps.   Worried about needing another dilation and curettage.  GYNECOLOGIC HISTORY: No LMP recorded. (Menstrual status: IUD). Contraception:  Vasectomy/Mirena IUD 03/04/16 Menopausal hormone therapy:  none Last mammogram:  10-07-20  3D/Neg/Birads1 Last pap smear:  12-23-20 Neg:Neg HR HPV, 12-03-16 Neg:Neg HR HPV, 10-12-13 Neg:neg HR HPV        OB History     Gravida  0   Para      Term      Preterm      AB      Living         SAB      IAB      Ectopic      Multiple      Live Births                 Patient Active Problem List   Diagnosis Date Noted   GAD (generalized anxiety disorder) 01/05/2021   Abnormal uterine bleeding 01/05/2021   Osteoarthritis    Chronic neck pain 11/23/2016   Fracture of right fibula 05/19/2015   Senile osteoporosis 04/02/2015   Gastroesophageal reflux disease 03/25/2015   Type I or II open fracture of distal end of right fibula 12/02/2014   BMI 40.0-44.9, adult ([place]) 02/12/2012   Vitamin D insufficiency 12/15/2010   h/o OSA (obstructive sleep apnea) 08/11/2010   Mixed hyperlipidemia 07/28/2010   Essential hypertension 07/28/2010   Depression 07/28/2010    Past Medical History:  Diagnosis Date   Anxiety    NO MEDS   Arthritis    Back pain    Cervical cancer screening 07/20/2012   Menarche at 13, regular til recently No h/o abnormal paps, last pap 2-3 years ago G0P0  MGM today, no h/o abnormal MGMs Spotting now monthly and scant H/o uterine polyps   Condyloma acuminata 2020   Depression    counseling in 2010 for 2 months on medication   Fibrocystic breast 1988   Fibroids 2014   Fracture of right fibula 05/19/2015   October 2016   GERD  (gastroesophageal reflux disease) 2005   treated with omeprazole   High cholesterol 2002   History of sinusitis    once/twice per year   Hyperglycemia 05/19/2015   Hypertension 2002   Joint pain    MRSA (methicillin resistant staph aureus) culture positive 2007   Muscular pain    Obesity    OSA (obstructive sleep apnea)    +sleep study 08/2010., DOES NOT USE CPAP, USES MOSES MOUTH PIECE   Osteoarthritis    KNEES, HANDS, HIPS   Osteoporosis    Plantar fasciitis of left foot 03/2014   Seasonal allergies    affected in the spring   Sleep apnea    mouthpiece "[place]"   Uterine polyp    Uterine polyp 05/19/2015   Recurrent Managed by Dr Rolly Pancake   Vitamin D deficiency    Wrist fracture 07/2012   right    Past Surgical History:  Procedure Laterality Date   DILATATION & CURETTAGE/HYSTEROSCOPY WITH MYOSURE N/A 07/30/2014   Procedure: DILATATION & CURETTAGE/HYSTEROSCOPY WITH MYOSURE;  Surgeon: Nunzio Cobbs, MD;  Location: [facility];  Service: Gynecology;  Laterality: N/A;  extra time for BMI 49.   DILATATION & CURRETTAGE/HYSTEROSCOPY WITH RESECTOCOPE N/A 11/24/2012   Procedure: DILATATION & CURETTAGE/HYSTEROSCOPY WITH RESECTOCOPE;  Surgeon: Arloa Koh, MD;  Location: [facility];  Service: Gynecology;  Laterality: N/A;   ESSURE TUBAL LIGATION Left    TONSILLECTOMY  1969   WISDOM TOOTH EXTRACTION  1979   WRIST FRACTURE SURGERY Right 07/2012    Current Outpatient Medications  Medication Sig Dispense Refill   busPIRone (BUSPAR) 7.5 MG tablet Take 1 tablet (7.5 mg total) by mouth 2 (two) times daily. 180 tablet 3   CALCIUM PO Take 1 tablet by mouth 2 (two) times daily.      Coenzyme Q10 (CO Q 10 PO) Take by mouth daily.     cyclobenzaprine (FLEXERIL) 10 MG tablet Take 1 tablet (10 mg total) by mouth daily as needed. 90 tablet 1   gabapentin (NEURONTIN) 300 MG capsule Take 1 capsule (300 mg total) by mouth at bedtime. 60 capsule 6   levonorgestrel (MIRENA) 20 MCG/24HR IUD 1 each  by Intrauterine route once.     lisinopril-hydrochlorothiazide (ZESTORETIC) 20-25 MG tablet Take 1 tablet by mouth daily. 90 tablet 1   LORazepam (ATIVAN) 1 MG tablet 1/2 to 1 tab po daily PRN anxiety 30 tablet 1   MAGNESIUM PO Take 500 mg by mouth.     Melatonin 3 MG TABS Take 1 tablet by mouth at bedtime.     meloxicam (MOBIC) 15 MG tablet Take 1 tablet (15 mg total) by mouth daily as needed for pain. 30 tablet 6   montelukast (SINGULAIR) 10 MG tablet Take 1 tablet (10 mg total) by mouth at bedtime. 90 tablet 3   Multiple Vitamin (MULTIVITAMIN) tablet Take 1 tablet by mouth daily.     mupirocin cream (BACTROBAN) 2 % Apply 1 application topically as needed. 15 g 1   Omega-3 Fatty Acids (FISH OIL) 1000 MG CAPS Take 1,000 mg by mouth daily.     omeprazole (PRILOSEC) 20 MG capsule Take 20 mg by mouth daily as needed.     oxybutynin (DITROPAN XL) 15 MG 24 hr tablet Take one tablet (15 mg) by mouth daily. 90 tablet 3   POTASSIUM PO Take 99 mg by mouth.     Probiotic Product (PROBIOTIC PO) Take by mouth.     sodium chloride (OCEAN) 0.65 % SOLN nasal spray Place 1 spray into both nostrils as needed for congestion. 15 mL 0   triamcinolone (NASACORT) 55 MCG/ACT AERO nasal inhaler Place 2 sprays into the nose daily.     TURMERIC PO Take by mouth.     Vilazodone HCl (VIIBRYD) 40 MG TABS Take 1 tablet (40 mg total) by mouth daily. 90 tablet 3   Vitamin D, Ergocalciferol, (DRISDOL) 1.25 MG (50000 UT) CAPS capsule Take 1 capsule (50,000 Units total) by mouth every 7 (seven) days. 12 capsule 3   No current facility-administered medications for this visit.     ALLERGIES: Bupropion  Family History  Problem Relation Age of Onset   Osteoarthritis Mother    Hyperlipidemia Mother    Depression Mother    Alzheimer's disease Mother    Hypertension Mother    Sleep apnea Mother    Obesity Mother    Liver cancer Maternal Grandmother        mets to breast   Breast cancer Maternal Grandmother        liver  mets to breast   Cancer Maternal Grandmother  with mets   Hyperlipidemia Father    Coronary artery disease Father    Hypertension Father    Stroke Father        multiple   Diabetes Father    Obesity Father    Sleep apnea Father    Colon cancer Neg Hx     Social History   Socioeconomic History   Marital status: Married    Spouse name: Raynelle Fujikawa   Number of children: Not on file   Years of education: Not on file   Highest education level: Not on file  Occupational History   Not on file  Tobacco Use   Smoking status: Former    Packs/day: 0.50    Years: 6.00    Pack years: 3.00    Types: Cigarettes    Quit date: 03/01/1989    Years since quitting: 31.8   Smokeless tobacco: Never   Tobacco comments:    Quit in 1991- started in 1983 1/2ppd  Vaping Use   Vaping Use: Never used  Substance and Sexual Activity   Alcohol use: Yes    Alcohol/week: 0.0 standard drinks    Comment: 2 drinks/month   Drug use: No   Sexual activity: Not Currently    Partners: Male    Birth control/protection: Other-see comments, I.U.D.    Comment: vasectomy/Mirena IUD inserted 03-04-16  Other Topics Concern   Not on file  Social History Narrative   Not on file   Social Determinants of Health   Financial Resource Strain: Not on file  Food Insecurity: Not on file  Transportation Needs: Not on file  Physical Activity: Not on file  Stress: Not on file  Social Connections: Not on file  Intimate Partner Violence: Not on file    Review of Systems  All other systems reviewed and are negative.  PHYSICAL EXAMINATION:    BP 118/66   Pulse 60   Ht 5' 3.25" (1.607 m)   Wt 226 lb (102.5 kg)   SpO2 100%   BMI 39.72 kg/m     General appearance: alert, cooperative and appears stated age                Pelvic US Uterus 7.19 x 4.44 x 3.24 cm.  2 fibroids:  subserous inferior/posterior 1.3 x 1.7 cm and subserous superior/posterior 1.6 x 1.6 cm.  EMS 1.99 mm,. IUD just below the highest  point of the fundus in endometrial canal.  Two hyperechoic ares on anterior wall of endometrium, 0.5 x 0.4 cm and 0.3 x 0.2 cm.  Left ovary atrophic.   Right ovary with right ovarian cyst 19.1 x 18.7 mm.   Small than on previous scan on 10/27/17.  No free fluid.   EMB Sterile prep with Hibiclens.  IUD strings noted.  Paracervical block with 5 cc 1% lidocaine, lot [id number], exp 4.2024.  Pipelle to 7 cm x 2.  Tissue to pathology.  Minimal EBL.  No complications.  IUD maintained.   Chaperone was present for exam: Glorianne Manchester, RN  ASSESSMENT  Mirena IUD slightly low. Postmenopausal bleeding.  Echogenic areas of endometrium.  Hx endometrial polyps.  PLAN  Pelvic US images and report reviewed.  Causes of postmenopausal bleeding reviewed:  atrophy, mechanical bleeding from IUD rubbing along endometrial walls, polyps, rare risk of abnormal cells.  FU EMB.  Final plan to follow.    An After Visit Summary was printed and given to the patient.  20 min  total time was spent for this  patient encounter, including preparation, face-to-face counseling with the patient, coordination of care, and documentation of the encounter.

## 2021-01-13 NOTE — Patient Instructions (Signed)
Endometrial Biopsy ?An endometrial biopsy is a procedure to remove tissue samples from the endometrium, which is the lining of the uterus. The tissue that is removed can then be checked under a microscope for disease. ?This procedure is used to diagnose conditions such as endometrial cancer, endometrial tuberculosis, polyps, or other inflammatory conditions. This procedure may also be used to investigate uterine bleeding to determine where you are in your menstrual cycle or how your hormone levels are affecting the lining of the uterus. ?Tell a health care provider about: ?Any allergies you have. ?All medicines you are taking, including vitamins, herbs, eye drops, creams, and over-the-counter medicines. ?Any problems you or family members have had with anesthetic medicines. ?Any blood disorders you have. ?Any surgeries you have had. ?Any medical conditions you have. ?Whether you are pregnant or may be pregnant. ?What are the risks? ?Generally, this is a safe procedure. However, problems may occur, including: ?Bleeding. ?Pelvic infection. ?Puncture of the wall of the uterus with the biopsy device (rare). ?Allergic reactions to medicines. ?What happens before the procedure? ?Keep a record of your menstrual cycles as told by your health care provider. You may need to schedule your procedure for a specific time in your cycle. ?You may want to bring a sanitary pad to wear after the procedure. ?Plan to have someone take you home from the hospital or clinic. ?Ask your health care provider about: ?Changing or stopping your regular medicines. This is especially important if you are taking diabetes medicines, arthritis medicines, or blood thinners. ?Taking medicines such as aspirin and ibuprofen. These medicines can thin your blood. Do not take these medicines unless your health care provider tells you to take them. ?Taking over-the-counter medicines, vitamins, herbs, and supplements. ?What happens during the procedure? ?You  will lie on an exam table with your feet and legs supported as in a pelvic exam. ?Your health care provider will insert an instrument (speculum) into your vagina to see your cervix. ?Your cervix will be cleansed with an antiseptic solution. ?A medicine (local anesthetic) will be used to numb the cervix. ?A forceps instrument (tenaculum) will be used to hold your cervix steady for the biopsy. ?A thin, rod-like instrument (uterine sound) will be inserted through your cervix to determine the length of your uterus and the location where the biopsy sample will be removed. ?A thin, flexible tube (catheter) will be inserted through your cervix and into the uterus. The catheter will be used to collect the biopsy sample from your endometrial tissue. ?The catheter and speculum will then be removed, and the tissue sample will be sent to a lab for examination. ?The procedure may vary among health care providers and hospitals. ?What can I expect after procedure? ?You will rest in a recovery area until you are ready to go home. ?You may have mild cramping and a small amount of vaginal bleeding. This is normal. ?You may have a small amount of vaginal bleeding for a few days. This is normal. ?It is up to you to get the results of your procedure. Ask your health care provider, or the department that is doing the procedure, when your results will be ready. ?Follow these instructions at home: ?Take over-the-counter and prescription medicines only as told by your health care provider. ?Do not douche, use tampons, or have sexual intercourse until your health care provider approves. ?Return to your normal activities as told by your health care provider. Ask your health care provider what activities are safe for you. ?Follow instructions   from your health care provider about any activity restrictions, such as restrictions on strenuous exercise or heavy lifting. ?Keep all follow-up visits. This is important. ?Contact a health care  provider: ?You have heavy bleeding, or bleed for longer than 2 days after the procedure. ?You have bad smelling discharge from your vagina. ?You have a fever or chills. ?You have a burning sensation when urinating or you have difficulty urinating. ?You have severe pain in your lower abdomen. ?Get help right away if you: ?You have severe cramps in your stomach or back. ?You pass large blood clots. ?Your bleeding increases. ?You become weak or light-headed, or you faint or lose consciousness. ?Summary ?An endometrial biopsy is a procedure to remove tissue samples is taken from the endometrium, which is the lining of the uterus. ?The tissue sample that is removed will be checked under a microscope for disease. ?This procedure is used to diagnose conditions such as endometrial cancer, endometrial tuberculosis, polyps, or other inflammatory conditions. ?After the procedure, it is common to have mild cramping and a small amount of vaginal bleeding for a few days. ?Do not douche, use tampons, or have sexual intercourse until your health care provider approves. Ask your health care provider which activities are safe for you. ?This information is not intended to replace advice given to you by your health care provider. Make sure you discuss any questions you have with your health care provider. ?Document Revised: 10/29/2020 Document Reviewed: 09/10/2019 ?Elsevier Patient Education ? 2022 [employer] ? ?

## 2021-01-15 LAB — SURGICAL PATHOLOGY

## 2021-01-19 ENCOUNTER — Telehealth: Payer: Self-pay

## 2021-01-19 NOTE — Telephone Encounter (Signed)
Spoke with patient and advised

## 2021-01-19 NOTE — Telephone Encounter (Signed)
-----   Message from Nunzio Cobbs, MD sent at 01/16/2021  2:23 PM EST ----- Please let patient know that I reviewed her endometrial biopsy which showed inactive endometrium and progesterone effect.  No polyp or abnormal cells were seen.   She does not need a dilation and curettage surgery.   She may continue with the Mirena IUD.

## 2021-01-19 NOTE — Telephone Encounter (Signed)
Ok if she is still spotting.  I expect that it will gradually decrease. Please let me know if she has pain or fever, then I would want to see her for an office visit.

## 2021-01-19 NOTE — Telephone Encounter (Signed)
Patient informed of result note. Alejandra Miller wanted me to check with Dr. Quincy Simmonds because Alejandra Miller is still spotting from the endo biopsy performed last Tuesday 01/13/21. Alejandra Miller said Alejandra Miller did not expect if to last this long.

## 2021-02-26 ENCOUNTER — Other Ambulatory Visit: Payer: Self-pay | Admitting: Orthopaedic Surgery

## 2021-02-26 ENCOUNTER — Other Ambulatory Visit: Payer: Self-pay | Admitting: Family

## 2021-02-26 ENCOUNTER — Encounter: Payer: Self-pay | Admitting: Nurse Practitioner

## 2021-02-26 MED ORDER — CYCLOBENZAPRINE HCL 10 MG PO TABS: ORAL_TABLET | ORAL | 1 refills | Status: DC

## 2021-02-26 NOTE — Telephone Encounter (Signed)
Refill request for:  Cyclobenzaprine 10 mg LR 09/05/20, 1 rf LOV 01/05/21 FOV  07/10/21   Please review and advise.  Thanks. Dm/cma

## 2021-04-15 ENCOUNTER — Telehealth: Payer: Self-pay

## 2021-04-15 ENCOUNTER — Other Ambulatory Visit: Payer: Self-pay | Admitting: Orthopaedic Surgery

## 2021-04-15 MED ORDER — MELOXICAM 15 MG PO TABS: 15.0000 mg | ORAL_TABLET | Freq: Every day | ORAL | 6 refills | Status: DC | PRN

## 2021-04-15 MED ORDER — GABAPENTIN 300 MG PO CAPS: 300.0000 mg | ORAL_CAPSULE | Freq: Every day | ORAL | 1 refills | Status: DC

## 2021-04-15 NOTE — Telephone Encounter (Signed)
[facility] sent request for meloxicam, cyclobenzaprine, and gabapentin

## 2021-04-16 NOTE — Telephone Encounter (Signed)
LMOM for patient of the below message from [place]  

## 2021-04-20 ENCOUNTER — Encounter: Payer: Self-pay | Admitting: Obstetrics and Gynecology

## 2021-04-20 ENCOUNTER — Encounter: Payer: Self-pay | Admitting: Nurse Practitioner

## 2021-04-20 ENCOUNTER — Other Ambulatory Visit: Payer: Self-pay | Admitting: Orthopaedic Surgery

## 2021-04-20 ENCOUNTER — Telehealth: Payer: Self-pay

## 2021-04-20 ENCOUNTER — Other Ambulatory Visit: Payer: Self-pay

## 2021-04-20 DIAGNOSIS — F3342 Major depressive disorder, recurrent, in full remission: Secondary | ICD-10-CM

## 2021-04-20 DIAGNOSIS — F411 Generalized anxiety disorder: Secondary | ICD-10-CM

## 2021-04-20 DIAGNOSIS — I10 Essential (primary) hypertension: Secondary | ICD-10-CM

## 2021-04-20 DIAGNOSIS — J3089 Other allergic rhinitis: Secondary | ICD-10-CM

## 2021-04-20 MED ORDER — CYCLOBENZAPRINE HCL 10 MG PO TABS: ORAL_TABLET | ORAL | 1 refills | Status: DC

## 2021-04-20 MED ORDER — MONTELUKAST SODIUM 10 MG PO TABS: 10.0000 mg | ORAL_TABLET | Freq: Every day | ORAL | 3 refills | Status: DC

## 2021-04-20 MED ORDER — BUSPIRONE HCL 7.5 MG PO TABS: 7.5000 mg | ORAL_TABLET | Freq: Two times a day (BID) | ORAL | 1 refills | Status: DC

## 2021-04-20 MED ORDER — VILAZODONE HCL 40 MG PO TABS: 40.0000 mg | ORAL_TABLET | Freq: Every day | ORAL | 3 refills | Status: DC

## 2021-04-20 MED ORDER — LISINOPRIL-HYDROCHLOROTHIAZIDE 20-25 MG PO TABS: 1.0000 | ORAL_TABLET | Freq: Every day | ORAL | 1 refills | Status: DC

## 2021-04-20 MED ORDER — OXYBUTYNIN CHLORIDE ER 15 MG PO TB24: ORAL_TABLET | ORAL | 2 refills | Status: DC

## 2021-04-20 NOTE — Telephone Encounter (Signed)
Requests refill of cyclobenzaprine [facility]

## 2021-04-20 NOTE — Telephone Encounter (Signed)
Pt contacted our office to inform us of pharmacy change to [facility] due to insurance and would like medication refills sent.  Chart supports Rx and medications sent.

## 2021-04-20 NOTE — Telephone Encounter (Signed)
Dr.Silva, I haven't received a call from [facility] or seen a fax from [facility] regarding this. Just FYI.   Annual exam was done on 12/23/21

## 2021-04-30 ENCOUNTER — Telehealth (INDEPENDENT_AMBULATORY_CARE_PROVIDER_SITE_OTHER): Payer: No Typology Code available for payment source | Admitting: Nurse Practitioner

## 2021-04-30 ENCOUNTER — Encounter: Payer: Self-pay | Admitting: Nurse Practitioner

## 2021-04-30 VITALS — BP 142/88 | Temp 98.8°F | Ht 63.25 in | Wt 228.0 lb

## 2021-04-30 DIAGNOSIS — J0140 Acute pansinusitis, unspecified: Secondary | ICD-10-CM

## 2021-04-30 DIAGNOSIS — J209 Acute bronchitis, unspecified: Secondary | ICD-10-CM

## 2021-04-30 MED ORDER — AZITHROMYCIN 250 MG PO TABS: 250.0000 mg | ORAL_TABLET | Freq: Every day | ORAL | 0 refills | Status: DC

## 2021-04-30 MED ORDER — BENZONATATE 200 MG PO CAPS: 200.0000 mg | ORAL_CAPSULE | Freq: Three times a day (TID) | ORAL | 0 refills | Status: DC | PRN

## 2021-04-30 NOTE — Patient Instructions (Signed)
Repeat BP this afternoon and send reading via mychart. ?Avoid decongestant ?Ok to use saline sinus rinse as needed ?Continue mucinex or mucinex DM as needed ?Maintain adequate oral hydration ?

## 2021-04-30 NOTE — Progress Notes (Signed)
Virtual Visit via Video Note ? ?I connected withNAME@ on 04/30/21 at 11:30 AM EST by a video enabled telemedicine application and verified that I am speaking with the correct person using two identifiers. ? ?Location: ?Patient:Home ?Provider: Office ?Participants: patient and provider ? ?I discussed the limitations of evaluation and management by telemedicine and the availability of in person appointments. ?I also discussed with the patient that there may be a patient responsible charge related to this service. The patient expressed understanding and agreed to proceed. ? ?CC:Pt c/o headaches, sinus congestion, productive cough with yellow mucus, and nasal drainage. Pt has been taking otc medications (mucinex) with no relief.  ? ?History of Present Illness: ? Sinusitis ?This is a new problem. The current episode started 1 to 4 weeks ago. The problem has been gradually worsening since onset. There has been no fever. Associated symptoms include congestion, coughing, headaches and sinus pressure. Pertinent negatives include no chills, ear pain, hoarse voice, neck pain, shortness of breath, sneezing, sore throat or swollen glands. Past treatments include acetaminophen (coricidin and mucinex). The treatment provided no relief.   ?BP Readings from Last 3 Encounters:  ?04/30/21 (!) 142/88  ?01/13/21 118/66  ?01/05/21 136/84  ?  ?Observations/Objective: ?Physical Exam ?Constitutional:   ?   General: She is not in acute distress. ?Cardiovascular:  ?   Rate and Rhythm: Normal rate.  ?   Pulses: Normal pulses.  ?Pulmonary:  ?   Effort: Pulmonary effort is normal.  ?Neurological:  ?   Mental Status: She is alert and oriented to person, place, and time.  ?  ?Assessment and Plan: ?Maely was seen today for acute home visit. ? ?Diagnoses and all orders for this visit: ? ?Acute bronchitis, unspecified organism ?-     benzonatate (TESSALON) 200 MG capsule; Take 1 capsule (200 mg total) by mouth 3 (three) times daily as needed. ? ?Acute  non-recurrent pansinusitis ?-     azithromycin (ZITHROMAX Z-PAK) 250 MG tablet; Take 1 tablet (250 mg total) by mouth daily. Take 2tabs on first day, then 1tab once a day till complete ? ? ?Follow Up Instructions: ?Repeat BP this afternoon and send reading via mychart. ?Avoid decongestant ?Ok to use saline sinus rinse as needed ?Continue mucinex or mucinex DM as needed ?Maintain adequate oral hydration ?  ?I discussed the assessment and treatment plan with the patient. The patient was provided an opportunity to ask questions and all were answered. The patient agreed with the plan and demonstrated an understanding of the instructions. ?  ?The patient was advised to call back or seek an in-person evaluation if the symptoms worsen or if the condition fails to improve as anticipated. ? ?Wilfred Lacy, NP  ?

## 2021-05-07 ENCOUNTER — Encounter: Payer: Self-pay | Admitting: Nurse Practitioner

## 2021-05-07 DIAGNOSIS — J209 Acute bronchitis, unspecified: Secondary | ICD-10-CM

## 2021-05-11 MED ORDER — BENZONATATE 200 MG PO CAPS: 200.0000 mg | ORAL_CAPSULE | Freq: Three times a day (TID) | ORAL | 0 refills | Status: DC | PRN

## 2021-05-11 NOTE — Telephone Encounter (Signed)
Pt aware.

## 2021-06-17 ENCOUNTER — Ambulatory Visit (INDEPENDENT_AMBULATORY_CARE_PROVIDER_SITE_OTHER): Payer: PRIVATE HEALTH INSURANCE

## 2021-06-17 DIAGNOSIS — Z23 Encounter for immunization: Secondary | ICD-10-CM

## 2021-06-17 NOTE — Progress Notes (Signed)
Per orders of [facility], pt is here for Shingles immunization (dose 2). pt received Shingles immunization (Dose 2) in Left Deltoid at 2:10 am. Given by [place] L. CMA/CPT. Pt tolerated Shingles Immunization (Dose 2) well. Shingles Immunization series completed. ? ?

## 2021-07-10 ENCOUNTER — Ambulatory Visit: Payer: No Typology Code available for payment source | Admitting: Nurse Practitioner

## 2021-08-07 ENCOUNTER — Encounter: Payer: Self-pay | Admitting: Nurse Practitioner

## 2021-08-07 ENCOUNTER — Ambulatory Visit (INDEPENDENT_AMBULATORY_CARE_PROVIDER_SITE_OTHER): Payer: PRIVATE HEALTH INSURANCE | Admitting: Nurse Practitioner

## 2021-08-07 ENCOUNTER — Ambulatory Visit (INDEPENDENT_AMBULATORY_CARE_PROVIDER_SITE_OTHER): Payer: PRIVATE HEALTH INSURANCE

## 2021-08-07 VITALS — BP 130/80 | HR 75 | Temp 96.2°F | Ht 63.5 in | Wt 232.4 lb

## 2021-08-07 DIAGNOSIS — M25532 Pain in left wrist: Secondary | ICD-10-CM | POA: Diagnosis not present

## 2021-08-07 DIAGNOSIS — M810 Age-related osteoporosis without current pathological fracture: Secondary | ICD-10-CM

## 2021-08-07 DIAGNOSIS — R350 Frequency of micturition: Secondary | ICD-10-CM

## 2021-08-07 DIAGNOSIS — E782 Mixed hyperlipidemia: Secondary | ICD-10-CM | POA: Diagnosis not present

## 2021-08-07 DIAGNOSIS — W19XXXA Unspecified fall, initial encounter: Secondary | ICD-10-CM

## 2021-08-07 DIAGNOSIS — F3342 Major depressive disorder, recurrent, in full remission: Secondary | ICD-10-CM

## 2021-08-07 DIAGNOSIS — I10 Essential (primary) hypertension: Secondary | ICD-10-CM

## 2021-08-07 DIAGNOSIS — Z6841 Body Mass Index (BMI) 40.0 and over, adult: Secondary | ICD-10-CM

## 2021-08-07 DIAGNOSIS — Z0001 Encounter for general adult medical examination with abnormal findings: Secondary | ICD-10-CM | POA: Diagnosis not present

## 2021-08-07 LAB — POCT URINALYSIS DIPSTICK
Bilirubin, UA: NEGATIVE
Blood, UA: NEGATIVE
Glucose, UA: NEGATIVE
Ketones, UA: NEGATIVE
Leukocytes, UA: NEGATIVE
Nitrite, UA: NEGATIVE
Protein, UA: NEGATIVE
Spec Grav, UA: <=1.005 — AB (ref 1.010–1.025)
Urobilinogen, UA: 0.2 E.U./dL
pH, UA: 7 (ref 5.0–8.0)

## 2021-08-07 LAB — BASIC METABOLIC PANEL
BUN: 9 mg/dL (ref 6–23)
CO2: 30 mEq/L (ref 19–32)
Calcium: 9.8 mg/dL (ref 8.4–10.5)
Chloride: 102 mEq/L (ref 96–112)
Creatinine, Ser: 0.78 mg/dL (ref 0.40–1.20)
GFR: 82.55 mL/min (ref >60.00)
Glucose, Bld: 95 mg/dL (ref 70–99)
Potassium: 3.5 mEq/L (ref 3.5–5.1)
Sodium: 140 mEq/L (ref 135–145)

## 2021-08-07 LAB — LIPID PANEL
Cholesterol: 250 mg/dL — ABNORMAL HIGH (ref 0–200)
HDL: 37.4 mg/dL — ABNORMAL LOW (ref >39.00)
LDL Cholesterol: 186 mg/dL — ABNORMAL HIGH (ref 0–99)
NonHDL: 212.42
Total CHOL/HDL Ratio: 7
Triglycerides: 130 mg/dL (ref 0.0–149.0)
VLDL: 26 mg/dL (ref 0.0–40.0)

## 2021-08-07 NOTE — Assessment & Plan Note (Addendum)
Repeat lipid panel ?

## 2021-08-07 NOTE — Assessment & Plan Note (Signed)
Reports poor diet and lack of exercise due to increased stress at home. She switched jobs 69monthago and reports  Decreased stress. She plans to start daily exercise (home exercise videos) and has started low fat/low sugar/low carb diet. BP Readings from Last 3 Encounters:  08/07/21 130/80  04/30/21 (!) 142/88  01/13/21 118/66

## 2021-08-07 NOTE — Assessment & Plan Note (Signed)
Stable mood with daily buspar and viibryd and use of vistaril prn. Maintain med dose

## 2021-08-07 NOTE — Progress Notes (Unsigned)
Complete physical exam  Patient: Alejandra Miller   DOB: Aug 06, 1960   61 y.o. Female  MRN: [id number] Visit Date: 08/07/2021  Subjective:    Chief Complaint  Patient presents with   Annual Exam    CPE Pt fasting  Concerns of urine frequency meds, have not been working recently. No other concerns   Alejandra Miller is a 61 y.o. female who presents today for a complete physical exam. She reports consuming a general diet.  None, plans to start home video exercise.  She generally feels well. She reports sleeping well. She does not have additional problems to discuss today.  Vision:Within the last year Dental:Within Last 6 months STD Screen:No  Most recent fall risk assessment:    08/07/2021    9:17 AM  [place] in the past year? 1  Number falls in past yr: 0  Injury with Fall? 1      08/07/2021    9:33 AM 01/05/2021   10:36 AM 09/12/2020    9:20 AM  Depression screen PHQ 2/9  Decreased Interest 0 1 0  Down, Depressed, Hopeless 1 1 0  PHQ - 2 Score 1 2 0  Altered sleeping 0 0 0  Tired, decreased energy '1 1 1  '$ Change in appetite 0 0 0  Feeling bad or failure about yourself  0 0 0  Trouble concentrating 0 0 1  Moving slowly or fidgety/restless 0 0 0  Suicidal thoughts 0 0 0  PHQ-9 Score '2 3 2  '$ Difficult doing work/chores Not difficult at all Not difficult at all        08/07/2021    9:33 AM 01/05/2021   10:36 AM 09/12/2020    9:20 AM 06/15/2019   10:13 AM  GAD 7 : Generalized Anxiety Score  Nervous, Anxious, on Edge 0 '1 1 1  '$ Control/stop worrying 0 1 0 1  Worry too much - different things '1 1 1 1  '$ Trouble relaxing 0 1 0 1  Restless 0 0 0 0  Easily annoyed or irritable '1 1 1 2  '$ Afraid - awful might happen 1 0 0 2  Total GAD 7 Score '3 5 3 8  '$ Anxiety Difficulty Not difficult at all Not difficult at all Not difficult at all Not difficult at all   Fall The accident occurred 12 to 24 hours ago. The fall occurred while walking. She fell from an unknown height. She landed on  Concrete. There was no blood loss. The point of impact was the right knee and left wrist. The pain is present in the left wrist. The pain is moderate. The symptoms are aggravated by pressure on injury, movement and extension. Pertinent negatives include no fever, headaches, hematuria, loss of consciousness, nausea, numbness, tingling, visual change or vomiting. She has tried ice for the symptoms. The treatment provided significant relief.  Urinary Frequency  This is a chronic problem. The current episode started more than 1 year ago. The problem occurs every urination. The problem has been gradually worsening. The pain is at a severity of 0/10. The patient is experiencing no pain. There has been no fever. She is Not sexually active. There is No history of pyelonephritis. Associated symptoms include frequency and urgency. Pertinent negatives include no chills, discharge, flank pain, hematuria, hesitancy, nausea, possible pregnancy, sweats or vomiting. Associated symptoms comments: And incontinence. Treatments tried: ditropan. The treatment provided mild relief. There is no history of urinary stasis or a urological procedure.  Senile osteoporosis Hx of fracture of wrist and ankle Took forteo injection x 70yr Last dexa scan 2017: normal Current use of calcium and vit. D  Repeat dexa scan  BMI 40.0-44.9, adult ([facility] Reports poor diet and lack of exercise due to increased stress at home. She switched jobs 151monthgo and reports  Decreased stress. She plans to start daily exercise (home exercise videos) and has started low fat/low sugar/low carb diet. BP Readings from Last 3 Encounters:  08/07/21 130/80  04/30/21 (!) 142/88  01/13/21 118/66    Mixed hyperlipidemia Repeat lipid panel  Recurrent major depressive disorder, in full remission ([place] with daily buspar and viibryd and use of vistaril prn. Maintain med dose  Essential hypertension BP at goal with lisinopril/hctz BP  Readings from Last 3 Encounters:  08/07/21 130/80  04/30/21 (!) 142/88  01/13/21 118/66   Repeat BMP Maintain med dose  Past Medical History:  Diagnosis Date   Anxiety    NO MEDS   Arthritis    Back pain    Cervical cancer screening 07/20/2012   Menarche at 13, regular til recently No h/o abnormal paps, last pap 2-3 years ago G0P0  MGM today, no h/o abnormal MGMs Spotting now monthly and scant H/o uterine polyps   Condyloma acuminata 2020   Depression    counseling in 2010 for 2 months on medication   Fibrocystic breast 1988   Fibroids 2014   Fracture of right fibula 05/19/2015   October 2016   GERD (gastroesophageal reflux disease) 2005   treated with omeprazole   High cholesterol 2002   History of sinusitis    once/twice per year   Hyperglycemia 05/19/2015   Hypertension 2002   Joint pain    MRSA (methicillin resistant staph aureus) culture positive 2007   Muscular pain    Obesity    OSA (obstructive sleep apnea)    +sleep study 08/2010., DOES NOT USE CPAP, USES MOSES MOUTH PIECE   Osteoarthritis    KNEES, HANDS, HIPS   Osteoporosis    Plantar fasciitis of left foot 03/2014   Seasonal allergies    affected in the spring   Sleep apnea    mouthpiece "[place]"   Uterine polyp    Uterine polyp 05/19/2015   Recurrent Managed by Dr BrRolly Pancake Vitamin D deficiency    Wrist fracture 07/2012   right   Past Surgical History:  Procedure Laterality Date   DILATATION & CURETTAGE/HYSTEROSCOPY WITH MYOSURE N/A 07/30/2014   Procedure: DILATATION & CURETTAGE/HYSTEROSCOPY WITH MYOSURE;  Surgeon: BrNunzio CobbsMD;  [facility];  Service: Gynecology;  Laterality: N/A;  extra time for BMI 49.   DILATATION & CURRETTAGE/HYSTEROSCOPY WITH RESECTOCOPE N/A 11/24/2012   Procedure: DILATATION & CURETTAGE/HYSTEROSCOPY WITH RESECTOCOPE;  Surgeon: BrArloa KohMD;  [facility];  Service: Gynecology;  Laterality: N/A;   ESSURE TUBAL LIGATION Left    TONSILLECTOMY  1969    WISDOM TOOTH EXTRACTION  1979   WRIST FRACTURE SURGERY Right 07/2012   Social History   Socioeconomic History   Marital status: Married    Spouse name: JiJake Goodson Number of children: Not on file   Years of education: Not on file   Highest education level: Not on file  Occupational History   Not on file  Tobacco Use   Smoking status: Former    Packs/day: 0.50    Years: 6.00    Total pack years: 3.00  Types: Cigarettes    Quit date: 03/01/1989    Years since quitting: 32.4   Smokeless tobacco: Never   Tobacco comments:    Quit in 1991- started in 1983 1/2ppd  Vaping Use   Vaping Use: Never used  Substance and Sexual Activity   Alcohol use: Yes    Alcohol/week: 0.0 standard drinks of alcohol    Comment: 2 drinks/month   Drug use: No   Sexual activity: Not Currently    Partners: Male    Birth control/protection: Other-see comments, I.U.D.    Comment: vasectomy/Mirena IUD inserted 03-04-16  Other Topics Concern   Not on file  Social History Narrative   Not on file   Social Determinants of Health   Financial Resource Strain: Not on file  Food Insecurity: Not on file  Transportation Needs: Not on file  Physical Activity: Not on file  Stress: Not on file  Social Connections: Not on file  Intimate Partner Violence: Not on file   Family Status  Relation Name Status   Mother  Deceased       32   Father  Deceased at age 26   Sister 05/09/64 Alive   Sister M1/2 41 Alive       1/2   Sister M 1/2 43 Alive   Sister P1/2 Deceased       70   Brother M1/2 Alive   Brother P1/2 Deceased   MGM  Deceased   MGF  Deceased   PGM  Deceased   PGF  Deceased   Neg Hx  (Not Specified)   Family History  Problem Relation Age of Onset   Osteoarthritis Mother    Hyperlipidemia Mother    Depression Mother    Alzheimer's disease Mother 34   Hypertension Mother    Sleep apnea Mother    Obesity Mother    Hyperlipidemia Father    Coronary artery disease Father    Hypertension  Father    Stroke Father        multiple   Diabetes Father    Obesity Father    Sleep apnea Father    Liver cancer Maternal Grandmother        mets to breast   Breast cancer Maternal Grandmother        liver mets to breast   Cancer Maternal Grandmother        with mets   Colon cancer Neg Hx    Allergies  Allergen Reactions   Bupropion Other (See Comments)    Makes her feel devoid of emotions    Patient Care Team: Demetria Lightsey, Charlene Brooke, NP as PCP - General (Internal Medicine) Nunzio Cobbs, MD as Consulting Physician (Obstetrics and Gynecology) Roseanne Kaufman, MD as Consulting Physician (Orthopedic Surgery) Kathi Simpers, MD as Referring Physician (Orthopedic Surgery) Gatha Mayer, MD as Consulting Physician (Gastroenterology) Southern, Betsy Whicker, PA-C (Pain Medicine)   Medications: Outpatient Medications Prior to Visit  Medication Sig   benzonatate (TESSALON) 200 MG capsule Take 1 capsule (200 mg total) by mouth 3 (three) times daily as needed.   busPIRone (BUSPAR) 7.5 MG tablet Take 1 tablet (7.5 mg total) by mouth 2 (two) times daily.   CALCIUM PO Take 1 tablet by mouth 2 (two) times daily.    Coenzyme Q10 (CO Q 10 PO) Take by mouth daily.   cyclobenzaprine (FLEXERIL) 10 MG tablet TAKE ONE TABLET BY MOUTH ONE TIME DAILY AS NEEDED   gabapentin (NEURONTIN) 300 MG capsule Take 1 capsule (  300 mg total) by mouth at bedtime.   levonorgestrel (MIRENA) 20 MCG/24HR IUD 1 each by Intrauterine route once.   lisinopril-hydrochlorothiazide (ZESTORETIC) 20-25 MG tablet Take 1 tablet by mouth daily.   LORazepam (ATIVAN) 1 MG tablet 1/2 to 1 tab po daily PRN anxiety   MAGNESIUM PO Take 500 mg by mouth.   Melatonin 3 MG TABS Take 1 tablet by mouth at bedtime.   meloxicam (MOBIC) 15 MG tablet Take 1 tablet (15 mg total) by mouth daily as needed for pain.   montelukast (SINGULAIR) 10 MG tablet Take 1 tablet (10 mg total) by mouth at bedtime.   Multiple Vitamin  (MULTIVITAMIN) tablet Take 1 tablet by mouth daily.   mupirocin cream (BACTROBAN) 2 % Apply 1 application topically as needed.   Omega-3 Fatty Acids (FISH OIL) 1000 MG CAPS Take 1,000 mg by mouth daily.   omeprazole (PRILOSEC) 20 MG capsule Take 20 mg by mouth daily as needed.   oxybutynin (DITROPAN XL) 15 MG 24 hr tablet Take one tablet (15 mg) by mouth daily.   POTASSIUM PO Take 99 mg by mouth.   Probiotic Product (PROBIOTIC PO) Take by mouth.   sodium chloride (OCEAN) 0.65 % SOLN nasal spray Place 1 spray into both nostrils as needed for congestion.   triamcinolone (NASACORT) 55 MCG/ACT AERO nasal inhaler Place 2 sprays into the nose daily.   TURMERIC PO Take by mouth.   Vilazodone HCl (VIIBRYD) 40 MG TABS Take 1 tablet (40 mg total) by mouth daily.   Vitamin D, Ergocalciferol, (DRISDOL) 1.25 MG (50000 UT) CAPS capsule Take 1 capsule (50,000 Units total) by mouth every 7 (seven) days.   [DISCONTINUED] azithromycin (ZITHROMAX Z-PAK) 250 MG tablet Take 1 tablet (250 mg total) by mouth daily. Take 2tabs on first day, then 1tab once a day till complete   No facility-administered medications prior to visit.    Review of Systems  Constitutional:  Negative for chills and fever.  HENT:  Negative for congestion and sore throat.   Eyes:        Negative for visual changes  Respiratory:  Negative for cough and shortness of breath.   Cardiovascular:  Negative for chest pain, palpitations and leg swelling.  Gastrointestinal:  Negative for blood in stool, constipation, diarrhea, nausea and vomiting.  Genitourinary:  Positive for frequency and urgency. Negative for dysuria, flank pain, hematuria and hesitancy.  Musculoskeletal:  Negative for myalgias.  Skin:  Negative for rash.  Neurological:  Negative for dizziness, tingling, loss of consciousness, numbness and headaches.  Hematological:  Does not bruise/bleed easily.  Psychiatric/Behavioral:  Negative for suicidal ideas. The patient is not  nervous/anxious.     {Show previous labs (optional):23779}    Objective:  BP 130/80 (BP Location: Left Arm, Patient Position: Sitting, Cuff Size: Normal)   Pulse 75   Temp (!) 96.2 F (35.7 C) (Temporal)   Ht 5' 3.5" (1.613 m)   Wt 232 lb 6.4 oz (105.4 kg)   SpO2 97%   BMI 40.52 kg/m     {Show previous vitals signs (optional):23777}  Physical Exam Vitals reviewed.  Constitutional:      Appearance: She is obese.  HENT:     Right Ear: Tympanic membrane, ear canal and external ear normal.     Left Ear: Tympanic membrane, ear canal and external ear normal.     Nose: Nose normal.  Eyes:     Extraocular Movements: Extraocular movements intact.     Conjunctiva/sclera: Conjunctivae normal.  Cardiovascular:  Rate and Rhythm: Normal rate and regular rhythm.     Pulses: Normal pulses.     Heart sounds: Normal heart sounds.  Pulmonary:     Effort: Pulmonary effort is normal.     Breath sounds: Normal breath sounds.  Abdominal:     General: Bowel sounds are normal.     Palpations: Abdomen is soft.  Musculoskeletal:        General: Swelling, tenderness and signs of injury present. No deformity.     Right forearm: Normal.     Left forearm: Normal.     Right wrist: Normal.     Left wrist: Swelling, tenderness, bony tenderness and snuff box tenderness present. No deformity, effusion, lacerations or crepitus. Decreased range of motion. Normal pulse.     Right knee: Swelling present. No effusion or crepitus. Normal range of motion. Tenderness present over the medial joint line. No patellar tendon tenderness. Normal pulse.     Left knee: Normal.     Right lower leg: No edema.     Left lower leg: No edema.     Comments: Abrasion and bruise on right knee  Skin:    General: Skin is warm and dry.     Findings: Bruising present.  Neurological:     Mental Status: She is alert and oriented to person, place, and time.  Psychiatric:        Mood and Affect: Mood normal.         Behavior: Behavior normal.        Thought Content: Thought content normal.      No results found for any visits on 08/07/21.    Assessment & Plan:    Routine Health Maintenance and Physical Exam  Immunization History  Administered Date(s) Administered   Influenza Split 12/06/2011   Influenza Whole 01/02/2013   Influenza,inj,Quad PF,6+ Mos 11/02/2013, 11/23/2016, 12/10/2017, 10/26/2018, 01/05/2021   Influenza-Unspecified 12/04/2014, 03/03/2016, 11/23/2016, 12/10/2017, 12/19/2019   PFIZER(Purple Top)SARS-COV-2 Vaccination 10/14/2019, 11/09/2019   Pneumococcal Conjugate-13 01/22/2013   Tdap 03/05/2002, 08/02/2012, 01/22/2013   Zoster Recombinat (Shingrix) 01/05/2021, 06/17/2021    Health Maintenance  Topic Date Due   COVID-19 Vaccine (3 - Pfizer series) 08/23/2021 (Originally 01/04/2020)   INFLUENZA VACCINE  09/29/2021   MAMMOGRAM  10/08/2022   TETANUS/TDAP  01/23/2023   COLONOSCOPY (Pts 45-43yr Insurance coverage will need to be confirmed)  10/23/2023   PAP SMEAR-Modifier  12/24/2023   Hepatitis C Screening  Completed   HIV Screening  Completed   Zoster Vaccines- Shingrix  Completed   HPV VACCINES  Aged Out    Discussed health benefits of physical activity, and encouraged her to engage in regular exercise appropriate for her age and condition.  Problem List Items Addressed This Visit       Cardiovascular and Mediastinum   Essential hypertension    BP at goal with lisinopril/hctz BP Readings from Last 3 Encounters:  08/07/21 130/80  04/30/21 (!) 142/88  01/13/21 118/66   Repeat BMP Maintain med dose        Musculoskeletal and Integument   Senile osteoporosis    Hx of fracture of wrist and ankle Took forteo injection x 298yrLast dexa scan 2017: normal Current use of calcium and vit. D  Repeat dexa scan      Relevant Orders   DG Bone Density     Other   BMI 40.0-44.9, adult (HCC) (Chronic)    Reports poor diet and lack of exercise due to increased  stress at home. She  switched jobs 12monthago and reports  Decreased stress. She plans to start daily exercise (home exercise videos) and has started low fat/low sugar/low carb diet. BP Readings from Last 3 Encounters:  08/07/21 130/80  04/30/21 (!) 142/88  01/13/21 118/66        Mixed hyperlipidemia    Repeat lipid panel      Relevant Orders   Lipid panel   Recurrent major depressive disorder, in full remission ([facility]    Stable mood with daily buspar and viibryd and use of vistaril prn. Maintain med dose      Other Visit Diagnoses     Encounter for preventative adult health care exam with abnormal findings    -  Primary   Relevant Orders   Basic metabolic panel   Fall, initial encounter       Relevant Orders   DG Wrist Complete Left   Wrist pain, acute, left       Relevant Orders   DG Wrist Complete Left   Urinary frequency       Relevant Orders   Urine Culture   POCT urinalysis dipstick      Return in about 6 months (around 02/06/2022) for HTN and GAD.     CWilfred Lacy NP

## 2021-08-07 NOTE — Assessment & Plan Note (Signed)
Hx of fracture of wrist and ankle Took forteo injection x 48yr Last dexa scan 2017: normal Current use of calcium and vit. D  Repeat dexa scan

## 2021-08-07 NOTE — Patient Instructions (Signed)
Go to lab You will be contacted to schedule appt for dexa scan  Preventive Care 60-60 Years Old, Female Preventive care refers to lifestyle choices and visits with your health care provider that can promote health and wellness. Preventive care visits are also called wellness exams. What can I expect for my preventive care visit? Counseling Your health care provider may ask you questions about your: Medical history, including: Past medical problems. Family medical history. Pregnancy history. Current health, including: Menstrual cycle. Method of birth control. Emotional well-being. Home life and relationship well-being. Sexual activity and sexual health. Lifestyle, including: Alcohol, nicotine or tobacco, and drug use. Access to firearms. Diet, exercise, and sleep habits. Work and work Statistician. Sunscreen use. Safety issues such as seatbelt and bike helmet use. Physical exam Your health care provider will check your: Height and weight. These may be used to calculate your BMI (body mass index). BMI is a measurement that tells if you are at a healthy weight. Waist circumference. This measures the distance around your waistline. This measurement also tells if you are at a healthy weight and may help predict your risk of certain diseases, such as type 2 diabetes and high blood pressure. Heart rate and blood pressure. Body temperature. Skin for abnormal spots. What immunizations do I need?  Vaccines are usually given at various ages, according to a schedule. Your health care provider will recommend vaccines for you based on your age, medical history, and lifestyle or other factors, such as travel or where you work. What tests do I need? Screening Your health care provider may recommend screening tests for certain conditions. This may include: Lipid and cholesterol levels. Diabetes screening. This is done by checking your blood sugar (glucose) after you have not eaten for a while  (fasting). Pelvic exam and Pap test. Hepatitis B test. Hepatitis C test. HIV (human immunodeficiency virus) test. STI (sexually transmitted infection) testing, if you are at risk. Lung cancer screening. Colorectal cancer screening. Mammogram. Talk with your health care provider about when you should start having regular mammograms. This may depend on whether you have a family history of breast cancer. BRCA-related cancer screening. This may be done if you have a family history of breast, ovarian, tubal, or peritoneal cancers. Bone density scan. This is done to screen for osteoporosis. Talk with your health care provider about your test results, treatment options, and if necessary, the need for more tests. Follow these instructions at home: Eating and drinking  Eat a diet that includes fresh fruits and vegetables, whole grains, lean protein, and low-fat dairy products. Take vitamin and mineral supplements as recommended by your health care provider. Do not drink alcohol if: Your health care provider tells you not to drink. You are pregnant, may be pregnant, or are planning to become pregnant. If you drink alcohol: Limit how much you have to 0-1 drink a day. Know how much alcohol is in your drink. In the [place] one drink equals one 12 oz bottle of beer (355 mL), one 5 oz glass of wine (148 mL), or one 1 oz glass of hard liquor (44 mL). Lifestyle Brush your teeth every morning and night with fluoride toothpaste. Floss one time each day. Exercise for at least 30 minutes 5 or more days each week. Do not use any products that contain nicotine or tobacco. These products include cigarettes, chewing tobacco, and vaping devices, such as e-cigarettes. If you need help quitting, ask your health care provider. Do not use drugs. If you are  sexually active, practice safe sex. Use a condom or other form of protection to prevent STIs. If you do not wish to become pregnant, use a form of birth control. If  you plan to become pregnant, see your health care provider for a prepregnancy visit. Take aspirin only as told by your health care provider. Make sure that you understand how much to take and what form to take. Work with your health care provider to find out whether it is safe and beneficial for you to take aspirin daily. Find healthy ways to manage stress, such as: Meditation, yoga, or listening to music. Journaling. Talking to a trusted person. Spending time with friends and family. Minimize exposure to UV radiation to reduce your risk of skin cancer. Safety Always wear your seat belt while driving or riding in a vehicle. Do not drive: If you have been drinking alcohol. Do not ride with someone who has been drinking. When you are tired or distracted. While texting. If you have been using any mind-altering substances or drugs. Wear a helmet and other protective equipment during sports activities. If you have firearms in your house, make sure you follow all gun safety procedures. Seek help if you have been physically or sexually abused. What's next? Visit your health care provider once a year for an annual wellness visit. Ask your health care provider how often you should have your eyes and teeth checked. Stay up to date on all vaccines. This information is not intended to replace advice given to you by your health care provider. Make sure you discuss any questions you have with your health care provider. Document Revised: 08/13/2020 Document Reviewed: 08/13/2020 Elsevier Patient Education  [place].

## 2021-08-07 NOTE — Progress Notes (Unsigned)
Initial visit for LEFT wrist pain following a fall yesterday. Pt fell on outstretched  hand

## 2021-08-07 NOTE — Assessment & Plan Note (Signed)
BP at goal with lisinopril/hctz BP Readings from Last 3 Encounters:  08/07/21 130/80  04/30/21 (!) 142/88  01/13/21 118/66   Repeat BMP Maintain med dose

## 2021-08-08 ENCOUNTER — Encounter: Payer: Self-pay | Admitting: Nurse Practitioner

## 2021-08-09 LAB — URINE CULTURE
MICRO NUMBER:: 13506488
SPECIMEN QUALITY:: ADEQUATE

## 2021-08-10 ENCOUNTER — Telehealth: Payer: Self-pay | Admitting: Nurse Practitioner

## 2021-08-10 NOTE — Telephone Encounter (Signed)
Called & spoke w/ pt 

## 2021-08-10 NOTE — Telephone Encounter (Signed)
Pt returned your call from earlier.

## 2021-08-11 MED ORDER — AMOXICILLIN 500 MG PO CAPS: 500.0000 mg | ORAL_CAPSULE | Freq: Three times a day (TID) | ORAL | 0 refills | Status: AC

## 2021-09-11 ENCOUNTER — Other Ambulatory Visit: Payer: Self-pay | Admitting: Orthopaedic Surgery

## 2021-09-11 ENCOUNTER — Other Ambulatory Visit: Payer: Self-pay | Admitting: Nurse Practitioner

## 2021-09-11 ENCOUNTER — Ambulatory Visit (INDEPENDENT_AMBULATORY_CARE_PROVIDER_SITE_OTHER): Payer: No Typology Code available for payment source | Admitting: Nurse Practitioner

## 2021-09-11 ENCOUNTER — Encounter: Payer: Self-pay | Admitting: Nurse Practitioner

## 2021-09-11 VITALS — BP 164/102 | HR 78 | Temp 97.7°F | Ht 63.5 in | Wt 239.0 lb

## 2021-09-11 DIAGNOSIS — I10 Essential (primary) hypertension: Secondary | ICD-10-CM | POA: Diagnosis not present

## 2021-09-11 DIAGNOSIS — M25461 Effusion, right knee: Secondary | ICD-10-CM | POA: Diagnosis not present

## 2021-09-11 NOTE — Progress Notes (Signed)
Established Patient Visit  Patient: Alejandra Miller   DOB: 04/21/1960   61 y.o. Female  MRN: [id number] Visit Date: 09/11/2021  Subjective:    Chief Complaint  Patient presents with   Acute Visit    Had a fall on 08/06/21, still experiencing Rt knee pain & swelling.  No other concerns   HPI She presents with right knee swelling. Onset 2days ago. She had a fall about 12monthago. Impact on right knee and left wrist. Today she denies any pain, no redness, no fever, no change is gait. Swelling is worse with prolonged weight bearing activity.  Essential hypertension Elevated today because she did not take BP med this and and also anxious about dental procedure scheduled for today. Asymptomatic BP Readings from Last 3 Encounters:  09/11/21 (!) 164/102  08/07/21 130/80  04/30/21 (!) 142/88   Advised to monitor BP at home every AM and send reading via mychart on Monday.  BP Readings from Last 3 Encounters:  09/11/21 (!) 164/102  08/07/21 130/80  04/30/21 (!) 142/88    Reviewed medical, surgical, and social history today  Medications: Outpatient Medications Prior to Visit  Medication Sig   benzonatate (TESSALON) 200 MG capsule Take 1 capsule (200 mg total) by mouth 3 (three) times daily as needed.   busPIRone (BUSPAR) 7.5 MG tablet Take 1 tablet (7.5 mg total) by mouth 2 (two) times daily.   CALCIUM PO Take 1 tablet by mouth 2 (two) times daily.    Coenzyme Q10 (CO Q 10 PO) Take by mouth daily.   cyclobenzaprine (FLEXERIL) 10 MG tablet TAKE ONE TABLET BY MOUTH ONE TIME DAILY AS NEEDED   gabapentin (NEURONTIN) 300 MG capsule Take 1 capsule (300 mg total) by mouth at bedtime.   levonorgestrel (MIRENA) 20 MCG/24HR IUD 1 each by Intrauterine route once.   lisinopril-hydrochlorothiazide (ZESTORETIC) 20-25 MG tablet Take 1 tablet by mouth daily.   LORazepam (ATIVAN) 1 MG tablet 1/2 to 1 tab po daily PRN anxiety   MAGNESIUM PO Take 500 mg by mouth.   Melatonin 3 MG TABS Take 1  tablet by mouth at bedtime.   meloxicam (MOBIC) 15 MG tablet Take 1 tablet (15 mg total) by mouth daily as needed for pain.   montelukast (SINGULAIR) 10 MG tablet Take 1 tablet (10 mg total) by mouth at bedtime.   Multiple Vitamin (MULTIVITAMIN) tablet Take 1 tablet by mouth daily.   mupirocin cream (BACTROBAN) 2 % Apply 1 application topically as needed.   Omega-3 Fatty Acids (FISH OIL) 1000 MG CAPS Take 1,000 mg by mouth daily.   omeprazole (PRILOSEC) 20 MG capsule Take 20 mg by mouth daily as needed.   oxybutynin (DITROPAN XL) 15 MG 24 hr tablet Take one tablet (15 mg) by mouth daily.   POTASSIUM PO Take 99 mg by mouth.   Probiotic Product (PROBIOTIC PO) Take by mouth.   sodium chloride (OCEAN) 0.65 % SOLN nasal spray Place 1 spray into both nostrils as needed for congestion.   triamcinolone (NASACORT) 55 MCG/ACT AERO nasal inhaler Place 2 sprays into the nose daily.   TURMERIC PO Take by mouth.   Vilazodone HCl (VIIBRYD) 40 MG TABS Take 1 tablet (40 mg total) by mouth daily.   Vitamin D, Ergocalciferol, (DRISDOL) 1.25 MG (50000 UT) CAPS capsule Take 1 capsule (50,000 Units total) by mouth every 7 (seven) days.   No facility-administered medications prior to visit.  Reviewed past medical and social history.   ROS per HPI above      Objective:  BP (!) 164/102 (BP Location: Right Arm, Patient Position: Sitting, Cuff Size: Normal)   Pulse 78   Temp 97.7 F (36.5 C) (Temporal)   Ht 5' 3.5" (1.613 m)   Wt 239 lb (108.4 kg)   SpO2 97%   BMI 41.67 kg/m      Physical Exam Cardiovascular:     Rate and Rhythm: Normal rate.     Pulses: Normal pulses.  Pulmonary:     Effort: Pulmonary effort is normal.  Musculoskeletal:        General: Swelling present. No tenderness.     Right knee: Swelling, effusion and crepitus present. No deformity, erythema, ecchymosis, lacerations or bony tenderness. Normal range of motion. No tenderness. Normal alignment, normal meniscus and normal  patellar mobility.     Left knee: Normal.  Skin:    Findings: No erythema or rash.  Neurological:     Mental Status: She is alert and oriented to person, place, and time.     No results found for any visits on 09/11/21.    Assessment & Plan:    Problem List Items Addressed This Visit       Cardiovascular and Mediastinum   Essential hypertension    Elevated today because she did not take BP med this and and also anxious about dental procedure scheduled for today. Asymptomatic BP Readings from Last 3 Encounters:  09/11/21 (!) 164/102  08/07/21 130/80  04/30/21 (!) 142/88   Advised to monitor BP at home every AM and send reading via mychart on Monday.       Other Visit Diagnoses     Swelling of joint of right knee    -  Primary     She declined appt today with ortho due to scheduled dental appt. She is scheduled appt with [facility] on 09/17/2021 at 1:15pm Ok to use knee compression sleeve during the day and off at night.  Return if symptoms worsen or fail to improve.     Wilfred Lacy, NP

## 2021-09-11 NOTE — Patient Instructions (Signed)
You have an appt at [facility] on 09/17/2021 at 1:15pm Ok to use knee compression sleeve during the day and off at night

## 2021-09-11 NOTE — Telephone Encounter (Signed)
Chart supports Rx Last OV: 08/2021 Next OV: not yet scheduled

## 2021-09-11 NOTE — Assessment & Plan Note (Addendum)
Elevated today because she did not take BP med this AM and and also anxious about dental procedure scheduled for today. Asymptomatic BP Readings from Last 3 Encounters:  09/11/21 (!) 164/102  08/07/21 130/80  04/30/21 (!) 142/88   Advised to monitor BP at home every AM and send reading via mychart on Monday.

## 2021-09-14 ENCOUNTER — Encounter: Payer: Self-pay | Admitting: Nurse Practitioner

## 2021-09-17 ENCOUNTER — Ambulatory Visit: Payer: PRIVATE HEALTH INSURANCE | Admitting: Physician Assistant

## 2021-09-21 ENCOUNTER — Ambulatory Visit (INDEPENDENT_AMBULATORY_CARE_PROVIDER_SITE_OTHER): Payer: No Typology Code available for payment source | Admitting: Physician Assistant

## 2021-09-21 ENCOUNTER — Ambulatory Visit (INDEPENDENT_AMBULATORY_CARE_PROVIDER_SITE_OTHER): Payer: No Typology Code available for payment source

## 2021-09-21 ENCOUNTER — Encounter: Payer: Self-pay | Admitting: Physician Assistant

## 2021-09-21 DIAGNOSIS — M25561 Pain in right knee: Secondary | ICD-10-CM

## 2021-09-21 MED ORDER — LIDOCAINE HCL 1 % IJ SOLN
4.0000 mL | INTRAMUSCULAR | Status: AC | PRN
  Administered 2021-09-21: 4 mL

## 2021-09-21 MED ORDER — METHYLPREDNISOLONE ACETATE 40 MG/ML IJ SUSP
40.0000 mg | INTRAMUSCULAR | Status: AC | PRN
  Administered 2021-09-21: 40 mg via INTRA_ARTICULAR

## 2021-09-21 NOTE — Progress Notes (Signed)
Office Visit Note   Patient: Alejandra Miller           Date of Birth: June 03, 1960           MRN: [id number] Visit Date: 09/21/2021              Requested by: Flossie Buffy, NP [place],   [id number] PCP: Flossie Buffy, NP   Assessment & Plan: Visit Diagnoses:  1. Right knee pain, unspecified chronicity     Plan: She is shown quad strengthening exercises.  She will continue her Mobic and Tylenol for knee pain.  We will see her back in 2 weeks to see how she is doing overall.  Questions were encouraged and answered at length.  Follow-Up Instructions: Return in about 2 weeks (around 10/05/2021).   Orders:  Orders Placed This Encounter  Procedures   Large Joint Inj   XR KNEE 3 VIEW RIGHT   No orders of the defined types were placed in this encounter.     Procedures: Large Joint Inj: R knee on 09/21/2021 11:38 AM Indications: pain Details: 22 G 1.5 in needle, anterolateral approach  Arthrogram: No  Medications: 4 mL lidocaine 1 %; 40 mg methylPREDNISolone acetate 40 MG/ML Aspirate: 5 mL bloody Outcome: tolerated well, no immediate complications Procedure, treatment alternatives, risks and benefits explained, specific risks discussed. Consent was given by the patient. Immediately prior to procedure a time out was called to verify the correct patient, procedure, equipment, support staff and site/side marked as required. Patient was prepped and draped in the usual sterile fashion.       Clinical Data: No additional findings.   Subjective: Chief Complaint  Patient presents with   Right Knee - Pain    HPI Patient 61 year old female comes in today with right knee pain.  Pain began July 9.  She states that she fell while walking across a parking lot was stepping up on a curb and lost her footing.  Saw her PCP on July 13 was told that she needed to have the blood fluid aspirated from her knee.  She denies any mechanical symptoms in the knee.   She states the pain in the knee is less than 1 approximately 0.5 on a scale 1-10.  Describes a achy pain.  She is taking Mobic Neurontin and Tylenol.  Review of Systems  See HPI otherwise negative Objective: Vital Signs: There were no vitals taken for this visit.  Physical Exam Constitutional:      Appearance: She is not ill-appearing or diaphoretic.  Pulmonary:     Effort: Pulmonary effort is normal.  Neurological:     Mental Status: She is alert and oriented to person, place, and time.  Psychiatric:        Mood and Affect: Mood normal.     Ortho Exam Bilateral knees good range of motion of both knees.  Significant patellofemoral crepitus both knees with passive range of motion.  No instability with valgus varus stressing of either knee.  McMurray's is negative.  Left knee nontender.  Right knee tenderness along the lateral joint line.  No abnormal warmth erythema of either knee.  Right knee with slight edema at plus minus effusion.  Specialty Comments:  No specialty comments available.  Imaging: XR KNEE 3 VIEW RIGHT  Result Date: 09/21/2021 Right knee 3 views: No acute fractures or acute findings.  All 3 compartments overall well-preserved.  Patella is well located.  Knee overall well located.  PMFS History: Patient Active Problem List   Diagnosis Date Noted   Recurrent major depressive disorder, in full remission ([place]) 08/07/2021   GAD (generalized anxiety disorder) 01/05/2021   Abnormal uterine bleeding 01/05/2021   Osteoarthritis    Chronic neck pain 11/23/2016   Senile osteoporosis 04/02/2015   Gastroesophageal reflux disease 03/25/2015   BMI 40.0-44.9, adult ([place]) 02/12/2012   Vitamin D insufficiency 12/15/2010   h/o OSA (obstructive sleep apnea) 08/11/2010   Mixed hyperlipidemia 07/28/2010   Essential hypertension 07/28/2010   Past Medical History:  Diagnosis Date   Anxiety    NO MEDS   Arthritis    Back pain    Cervical cancer screening 07/20/2012    Menarche at 13, regular til recently No h/o abnormal paps, last pap 2-3 years ago G0P0  MGM today, no h/o abnormal MGMs Spotting now monthly and scant H/o uterine polyps   Condyloma acuminata 2020   Depression    counseling in 2010 for 2 months on medication   Fibrocystic breast 1988   Fibroids 2014   Fracture of right fibula 05/19/2015   October 2016   GERD (gastroesophageal reflux disease) 2005   treated with omeprazole   High cholesterol 2002   History of sinusitis    once/twice per year   Hyperglycemia 05/19/2015   Hypertension 2002   Joint pain    MRSA (methicillin resistant staph aureus) culture positive 2007   Muscular pain    Obesity    OSA (obstructive sleep apnea)    +sleep study 08/2010., DOES NOT USE CPAP, USES MOSES MOUTH PIECE   Osteoarthritis    KNEES, HANDS, HIPS   Osteoporosis    Plantar fasciitis of left foot 03/2014   Seasonal allergies    affected in the spring   Sleep apnea    mouthpiece "[place]"   Uterine polyp    Uterine polyp 05/19/2015   Recurrent Managed by Dr Rolly Pancake   Vitamin D deficiency    Wrist fracture 07/2012   right    Family History  Problem Relation Age of Onset   Osteoarthritis Mother    Hyperlipidemia Mother    Depression Mother    Alzheimer's disease Mother 82   Hypertension Mother    Sleep apnea Mother    Obesity Mother    Hyperlipidemia Father    Coronary artery disease Father    Hypertension Father    Stroke Father        multiple   Diabetes Father    Obesity Father    Sleep apnea Father    Liver cancer Maternal Grandmother        mets to breast   Breast cancer Maternal Grandmother        liver mets to breast   Cancer Maternal Grandmother        with mets   Colon cancer Neg Hx     Past Surgical History:  Procedure Laterality Date   DILATATION & CURETTAGE/HYSTEROSCOPY WITH MYOSURE N/A 07/30/2014   Procedure: DILATATION & CURETTAGE/HYSTEROSCOPY WITH MYOSURE;  Surgeon: Nunzio Cobbs, MD;  Location: [facility];   Service: Gynecology;  Laterality: N/A;  extra time for BMI 49.   DILATATION & CURRETTAGE/HYSTEROSCOPY WITH RESECTOCOPE N/A 11/24/2012   Procedure: DILATATION & CURETTAGE/HYSTEROSCOPY WITH RESECTOCOPE;  Surgeon: Arloa Koh, MD;  Location: [facility];  Service: Gynecology;  Laterality: N/A;   ESSURE TUBAL LIGATION Left    TONSILLECTOMY  1969   WISDOM TOOTH EXTRACTION  1979   WRIST FRACTURE SURGERY  Right 07/2012   Social History   Occupational History   Not on file  Tobacco Use   Smoking status: Former    Packs/day: 0.50    Years: 6.00    Total pack years: 3.00    Types: Cigarettes    Quit date: 03/01/1989    Years since quitting: 32.5   Smokeless tobacco: Never   Tobacco comments:    Quit in 1991- started in 1983 1/2ppd  Vaping Use   Vaping Use: Never used  Substance and Sexual Activity   Alcohol use: Yes    Alcohol/week: 0.0 standard drinks of alcohol    Comment: 2 drinks/month   Drug use: No   Sexual activity: Not Currently    Partners: Male    Birth control/protection: Other-see comments, I.U.D.    Comment: vasectomy/Mirena IUD inserted 03-04-16

## 2021-10-05 ENCOUNTER — Encounter: Payer: Self-pay | Admitting: Physician Assistant

## 2021-10-05 ENCOUNTER — Ambulatory Visit: Payer: No Typology Code available for payment source | Admitting: Physician Assistant

## 2021-10-05 DIAGNOSIS — M25561 Pain in right knee: Secondary | ICD-10-CM

## 2021-10-05 NOTE — Progress Notes (Signed)
HPI: Mrs. Aho returns today for follow-up of her right knee.  Injection 09/21/2021.  She states she is about 80% better.  She does continue to do the home exercises for quad strengthening.  She is taking meloxicam gabapentin and Tylenol feels this is somewhat beneficial.  She has had 1 occasion of giving way of the knee but otherwise no mechanical symptoms.  She has had no swelling in the knee.  Review of systems: See HPI otherwise negative.  Physical exam: General well-developed well-nourished female no acute distress mood affect appropriate.  Bilateral knees: No abnormal warmth erythema or effusion.  No instability valgus varus stressing.  Patellofemoral crepitus right knee with passive range of motion.  Overall good range of motion of both knees.  Impression: Right knee pain  Plan: Discussed with her quad strengthening exercises and knee friendly exercises.  Recommend she continue to work on weight loss.  Continue meloxicam and Tylenol for pain.  She will follow-up with [name] as needed.  Questions were encouraged and answered at length.  She may benefit from supplemental injections in the future if she has short relief with the cortisone injections.

## 2021-10-07 ENCOUNTER — Encounter (INDEPENDENT_AMBULATORY_CARE_PROVIDER_SITE_OTHER): Payer: Self-pay

## 2021-11-30 ENCOUNTER — Other Ambulatory Visit: Payer: Self-pay | Admitting: Obstetrics and Gynecology

## 2021-11-30 ENCOUNTER — Ambulatory Visit: Payer: No Typology Code available for payment source

## 2021-11-30 DIAGNOSIS — Z1231 Encounter for screening mammogram for malignant neoplasm of breast: Secondary | ICD-10-CM

## 2021-12-02 ENCOUNTER — Ambulatory Visit: Payer: No Typology Code available for payment source

## 2021-12-03 ENCOUNTER — Encounter: Payer: Self-pay | Admitting: Nurse Practitioner

## 2021-12-03 DIAGNOSIS — G8929 Other chronic pain: Secondary | ICD-10-CM

## 2021-12-03 MED ORDER — CYCLOBENZAPRINE HCL 10 MG PO TABS: ORAL_TABLET | ORAL | 0 refills | Status: DC

## 2021-12-13 ENCOUNTER — Encounter: Payer: Self-pay | Admitting: Nurse Practitioner

## 2021-12-13 DIAGNOSIS — J209 Acute bronchitis, unspecified: Secondary | ICD-10-CM

## 2021-12-14 MED ORDER — BENZONATATE 200 MG PO CAPS: 200.0000 mg | ORAL_CAPSULE | Freq: Three times a day (TID) | ORAL | 0 refills | Status: DC | PRN

## 2021-12-24 ENCOUNTER — Ambulatory Visit: Payer: No Typology Code available for payment source | Admitting: Obstetrics and Gynecology

## 2021-12-25 ENCOUNTER — Ambulatory Visit: Payer: No Typology Code available for payment source

## 2022-01-01 ENCOUNTER — Ambulatory Visit
Admission: RE | Admit: 2022-01-01 | Discharge: 2022-01-01 | Disposition: A | Discharge status: Home / Self Care | Payer: PRIVATE HEALTH INSURANCE | Source: Ambulatory Visit | Attending: Obstetrics and Gynecology | Admitting: Obstetrics and Gynecology

## 2022-01-01 DIAGNOSIS — Z1231 Encounter for screening mammogram for malignant neoplasm of breast: Secondary | ICD-10-CM

## 2022-01-14 ENCOUNTER — Encounter: Payer: Self-pay | Admitting: Obstetrics and Gynecology

## 2022-01-14 ENCOUNTER — Ambulatory Visit (INDEPENDENT_AMBULATORY_CARE_PROVIDER_SITE_OTHER): Payer: No Typology Code available for payment source | Admitting: Obstetrics and Gynecology

## 2022-01-14 VITALS — BP 118/70 | HR 62 | Ht 63.0 in | Wt 244.0 lb

## 2022-01-14 DIAGNOSIS — Z01419 Encounter for gynecological examination (general) (routine) without abnormal findings: Secondary | ICD-10-CM | POA: Diagnosis not present

## 2022-01-14 DIAGNOSIS — N3281 Overactive bladder: Secondary | ICD-10-CM | POA: Diagnosis not present

## 2022-01-14 DIAGNOSIS — L089 Local infection of the skin and subcutaneous tissue, unspecified: Secondary | ICD-10-CM

## 2022-01-14 DIAGNOSIS — R350 Frequency of micturition: Secondary | ICD-10-CM | POA: Diagnosis not present

## 2022-01-14 LAB — URINALYSIS, COMPLETE W/RFL CULTURE
Bacteria, UA: NONE SEEN /HPF
Bilirubin Urine: NEGATIVE
Glucose, UA: NEGATIVE
Hgb urine dipstick: NEGATIVE
Hyaline Cast: NONE SEEN /LPF
Ketones, ur: NEGATIVE
Leukocyte Esterase: NEGATIVE
Nitrites, Initial: NEGATIVE
Protein, ur: NEGATIVE
RBC / HPF: NONE SEEN /HPF (ref 0–2)
Specific Gravity, Urine: 1.01 (ref 1.001–1.035)
WBC, UA: NONE SEEN /HPF (ref 0–5)
pH: 7 (ref 5.0–8.0)

## 2022-01-14 LAB — NO CULTURE INDICATED

## 2022-01-14 MED ORDER — SULFAMETHOXAZOLE-TRIMETHOPRIM 800-160 MG PO TABS: 1.0000 | ORAL_TABLET | Freq: Two times a day (BID) | ORAL | 0 refills | Status: DC

## 2022-01-14 MED ORDER — GEMTESA 75 MG PO TABS: 75.0000 mg | ORAL_TABLET | Freq: Every day | ORAL | 1 refills | Status: DC

## 2022-01-14 NOTE — Progress Notes (Signed)
61 y.o. 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  She is using Mupirocin cream.  Not going away.   Used Bactrim DS in the past.   She is followed for overactive bladder.  She takes Ditropan XL 15 mg daily.  Seen by her PCP a few months ago and had a UTI.  She gave a urine specimen today due to not feeling like she is emptying fully.   Has Mirena IUD.  No pain or bleeding.   PCP:   Wilfred Lacy, NP  No LMP recorded (lmp unknown). (Menstrual status: IUD).           Sexually active: No.  The current method of family planning is Vasectomy/Mirena IUD 03/04/16  Exercising: No.     Smoker:  no  Health Maintenance: Pap:  12-23-20 Neg:Neg HR HPV, 12-03-16 Neg:Neg HR HPV, 10-12-13 Neg:neg HR HPV  History of abnormal Pap:  no MMG:   01/01/22 - BI-RADS1,  10-07-20  3D/Neg/Birads1   Colonoscopy:  10/22/2013  - due in 2025, [facility]:   07/27/2019 TDaP:  01/22/2013 Gardasil:   no HIV:  2006 - NR Hep C:  2017 - neg Screening Labs:  PCP Flu vaccine:  today. Shingrix:  completed.  RSV and Covid vaccines discussed.    reports that she quit smoking about 32 years ago. Her smoking use included cigarettes. She has a 3.00 pack-year smoking history. She has never used smokeless tobacco. She reports current alcohol use. She reports that she does not use drugs.  Past Medical History:  Diagnosis Date   Anxiety    NO MEDS   Arthritis    Back pain    Cervical cancer screening 07/20/2012   Menarche at 13, regular til recently No h/o abnormal paps, last pap 2-3 years ago G0P0  MGM today, no h/o abnormal MGMs Spotting now monthly and scant H/o uterine polyps   Condyloma acuminata 2020   Depression    counseling in 2010 for 2 months on medication   Fibrocystic breast 1988   Fibroids 2014   Fracture of right fibula 05/19/2015   October 2016   GERD (gastroesophageal reflux disease) 2005   treated with omeprazole   High cholesterol 2002   History of  sinusitis    once/twice per year   Hyperglycemia 05/19/2015   Hypertension 2002   Joint pain    MRSA (methicillin resistant staph aureus) culture positive 2007   Muscular pain    Obesity    OSA (obstructive sleep apnea)    +sleep study 08/2010., DOES NOT USE CPAP, USES MOSES MOUTH PIECE   Osteoarthritis    KNEES, HANDS, HIPS   Osteoporosis    Plantar fasciitis of left foot 03/2014   Seasonal allergies    affected in the spring   Sleep apnea    mouthpiece "[facility]"   Uterine polyp    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   DILATATION & CURRETTAGE/HYSTEROSCOPY WITH RESECTOCOPE N/A 11/24/2012   Procedure: DILATATION & CURETTAGE/HYSTEROSCOPY WITH RESECTOCOPE;  Surgeon: Arloa Koh, MD;  Location: [facility];  Service: Gynecology;  Laterality: N/A;   ESSURE TUBAL LIGATION Left    TONSILLECTOMY  1969   WISDOM TOOTH EXTRACTION  1979   WRIST FRACTURE SURGERY Right 07/2012    Current Outpatient Medications  Medication Sig Dispense Refill   benzonatate (TESSALON) 200 MG capsule Take 1 capsule (200 mg total) by mouth 3 (three) times daily as needed. 30 capsule 0   busPIRone (BUSPAR) 7.5 MG tablet Take 1 tablet (7.5 mg total) by mouth 2 (two) times daily. 180 tablet 1   CALCIUM PO Take 1 tablet by mouth 2 (two) times daily.      Coenzyme Q10 (CO Q 10 PO) Take by mouth daily.     cyclobenzaprine (FLEXERIL) 10 MG tablet TAKE ONE TABLET BY MOUTH ONE TIME DAILY AS NEEDED 90 tablet 0   gabapentin (NEURONTIN) 300 MG capsule TAKE 1 CAPSULE BY MOUTH AT  BEDTIME 90 capsule 3   levonorgestrel (MIRENA) 20 MCG/24HR IUD 1  each by Intrauterine route once.     lisinopril-hydrochlorothiazide (ZESTORETIC) 20-25 MG tablet TAKE 1 TABLET BY MOUTH DAILY 90 tablet 3   LORazepam (ATIVAN) 1 MG tablet 1/2 to 1 tab po daily PRN anxiety 30 tablet 1   MAGNESIUM PO Take 500 mg by mouth.     Melatonin 3 MG TABS Take 1 tablet by mouth at bedtime.     meloxicam (MOBIC) 15 MG tablet Take 1 tablet (15 mg total) by mouth daily as needed for pain. 90 tablet 6   montelukast (SINGULAIR) 10 MG tablet Take 1 tablet (10 mg total) by mouth at bedtime. 90 tablet 3   Multiple Vitamin (MULTIVITAMIN) tablet Take 1 tablet by mouth daily.     mupirocin cream (BACTROBAN) 2 % Apply 1 application topically as needed. 15 g 1   Omega-3 Fatty Acids (FISH OIL) 1000 MG CAPS Take 1,000 mg by mouth daily.     omeprazole (PRILOSEC) 20 MG capsule Take 20 mg by mouth daily as needed.     POTASSIUM PO Take 99 mg by mouth.     Probiotic Product (PROBIOTIC PO) Take by mouth.     sodium chloride (OCEAN) 0.65 % SOLN nasal spray Place 1 spray into both nostrils as needed for congestion. 15 mL 0   sulfamethoxazole-trimethoprim (BACTRIM DS) 800-160 MG tablet Take 1 tablet by mouth 2 (two) times daily. One PO BID x 3 days 14 tablet 0   triamcinolone (NASACORT) 55 MCG/ACT AERO nasal inhaler Place 2 sprays into the nose daily.     TURMERIC PO Take by mouth.     Vibegron (GEMTESA) 75 MG TABS Take 75 mg by mouth daily. 30 tablet 1   Vilazodone HCl (VIIBRYD) 40 MG TABS Take 1 tablet (40 mg total) by mouth daily. 90 tablet 3   Vitamin D, Ergocalciferol, (DRISDOL) 1.25 MG (50000 UT) CAPS capsule Take 1 capsule (50,000 Units total) by mouth every 7 (seven) days. 12 capsule 3   No current facility-administered medications for this visit.    Family History  Problem Relation Age of Onset   Osteoarthritis Mother    Hyperlipidemia Mother    Depression Mother    Alzheimer's disease Mother 71   Hypertension Mother    Sleep apnea Mother    Obesity Mother    Hyperlipidemia  Father    Coronary artery disease Father    Hypertension Father    Stroke Father        multiple   Diabetes Father    Obesity Father  Sleep apnea Father    Liver cancer Maternal Grandmother        mets to breast   Breast cancer Maternal Grandmother        liver mets to breast   Cancer Maternal Grandmother        with mets   Colon cancer Neg Hx     Review of Systems  All other systems reviewed and are negative.   Exam:   BP 118/70 (BP Location: Right Arm, Patient Position: Sitting, Cuff Size: Large)   Pulse 62   Ht '5\' 3"'$  (1.6 m)   Wt 244 lb (110.7 kg)   LMP  (LMP Unknown)   BMI 43.22 kg/m     General appearance: alert, cooperative and appears stated age Head: normocephalic, without obvious abnormality, atraumatic Neck: no adenopathy, supple, symmetrical, trachea midline and thyroid normal to inspection and palpation Lungs: clear to auscultation bilaterally Breasts: normal appearance, no masses or tenderness, No nipple retraction or dimpling, No nipple discharge or bleeding, No axillary adenopathy Heart: regular rate and rhythm Abdomen: soft, non-tender; no masses, no organomegaly Extremities: extremities normal, atraumatic, no cyanosis or edema Skin: skin color, texture, turgor normal. Right abdominal wall with 4 cm area of erythema and 1 cm raised area in the middle.  No drainage.  Lymph nodes: cervical, supraclavicular, and axillary nodes normal. Neurologic: grossly normal  Pelvic: External genitalia:  no lesions              No abnormal inguinal nodes palpated.              Urethra:  normal appearing urethra with no masses, tenderness or lesions              Bartholins and Skenes: normal                 Vagina: normal appearing vagina with normal color and discharge, no lesions              Cervix: no lesions.  IUD strings noted.               Pap taken: no Bimanual Exam:  Uterus:  normal size, contour, position, consistency, mobility, non-tender               Adnexa: no mass, fullness, tenderness              Rectal exam: yes.  Confirms.              Anus:  normal sphincter tone, no lesions  Chaperone was present for exam:  Kimlexis.  Assessment:   Well woman visit with gynecologic exam. Mirena IUD.  Placed 03/04/16.  Ok until 03/04/24.   IUD is low based on ultrasound findings.  Perimenopausal bleeding Has unilateral Essure. Overactive bladder.  HTN.  Hx anxiety.  Osteoporosis dx due to fragility fractures. Off Forteo. Status post Reclast.  Follow[facility] by [facility] clinic at [facility].  Taking 50,000 IU weekly.   Plan: Mammogram screening discussed. Self breast awareness reviewed. Pap and HR HPV not indicated. Guidelines for Calcium, Vitamin D, regular exercise program including cardiovascular and weight bearing exercise.   Follow up annually and prn.   In addition to annual exam, patient was evaluated and treated for urinary frequency, overactive bladder new medication, and for skin infection presumed to be MRSA based on appearance and prior hx.  Urinalysis negative.   No UC sent.  Stop Ditropan XL.  Rx for Gemtesa 75 mg daily. Side effects discussed.  Rx for  Bactrim DS po bid x 7 days.  Fu for recheck in 7 - 8 weeks.  She will return also if her skin infection is not improved.   After visit summary provided.

## 2022-01-14 NOTE — Addendum Note (Signed)
Addended by: Lorine Bears on: 01/14/2022 02:06 PM   Modules accepted: Orders

## 2022-01-14 NOTE — Patient Instructions (Addendum)
EXERCISE AND DIET:  We recommended that you start or continue a regular exercise program for good health. Regular exercise means any activity that makes your heart beat faster and makes you sweat.  We recommend exercising at least 30 minutes per day at least 3 days a week, preferably 4 or 5.  We also recommend a diet low in fat and sugar.  Inactivity, poor dietary choices and obesity can cause diabetes, heart attack, stroke, and kidney damage, among others.    ALCOHOL AND SMOKING:  Women should limit their alcohol intake to no more than 7 drinks/beers/glasses of wine (combined, not each!) per week. Moderation of alcohol intake to this level decreases your risk of breast cancer and liver damage. And of course, no recreational drugs are part of a healthy lifestyle.  And absolutely no smoking or even second hand smoke. Most people know smoking can cause heart and lung diseases, but did you know it also contributes to weakening of your bones? Aging of your skin?  Yellowing of your teeth and nails?  CALCIUM AND VITAMIN D:  Adequate intake of calcium and Vitamin D are recommended.  The recommendations for exact amounts of these supplements seem to change often, but generally speaking 600 mg of calcium (either carbonate or citrate) and 800 units of Vitamin D per day seems prudent. Certain women may benefit from higher intake of Vitamin D.  If you are among these women, your doctor will have told you during your visit.    PAP SMEARS:  Pap smears, to check for cervical cancer or precancers,  have traditionally been done yearly, although recent scientific advances have shown that most women can have pap smears less often.  However, every woman still should have a physical exam from her gynecologist every year. It will include a breast check, inspection of the vulva and vagina to check for abnormal growths or skin changes, a visual exam of the cervix, and then an exam to evaluate the size and shape of the uterus and  ovaries.  And after 60 years of age, a rectal exam is indicated to check for rectal cancers. We will also provide age appropriate advice regarding health maintenance, like when you should have certain vaccines, screening for sexually transmitted diseases, bone density testing, colonoscopy, mammograms, etc.   MAMMOGRAMS:  All women over 40 years old should have a yearly mammogram. Many facilities now offer a "3D" mammogram, which may cost around $50 extra out of pocket. If possible,  we recommend you accept the option to have the 3D mammogram performed.  It both reduces the number of women who will be called back for extra views which then turn out to be normal, and it is better than the routine mammogram at detecting truly abnormal areas.    COLONOSCOPY:  Colonoscopy to screen for colon cancer is recommended for all women at age 50.  We know, you hate the idea of the prep.  We agree, BUT, having colon cancer and not knowing it is worse!!  Colon cancer so often starts as a polyp that can be seen and removed at colonscopy, which can quite literally save your life!  And if your first colonoscopy is normal and you have no family history of colon cancer, most women don't have to have it again for 10 years.  Once every ten years, you can do something that may end up saving your life, right?  We will be happy to help you get it scheduled when you are ready.    Be sure to check your insurance coverage so you understand how much it will cost.  It may be covered as a preventative service at no cost, but you should check your particular policy.    Vibegron Tablets What is this medication? VIBEGRON (vye BEG ron) treats symptoms of an overactive bladder, such as loss of bladder control or frequent need to urinate. It works by relaxing muscles in the bladder. It belongs to a group of medications called antispasmodics. This medicine may be used for other purposes; ask your health care provider or pharmacist if you have  questions. COMMON BRAND NAME(S): GEMTESA What should I tell my care team before I take this medication? They need to know if you have any of these conditions: Kidney disease Liver disease Prostate disease Trouble passing urine An unusual or allergic reaction to vibegron, other medications, foods, dyes, or preservatives Pregnant or trying to get pregnant Breast-feeding How should I use this medication? Take this medication by mouth with water. Take it as directed on the prescription label at the same time every day. Swallow the tablets whole. You may crush the tablet and mix with 1 tablespoon (15 mL) of applesauce. Swallow the medication and applesauce right away. Keep taking it unless your care team tells you to stop. You can take it with or without food. If it upsets your stomach, take it with food. Talk to your care team about the use of this medication in children. Special care may be needed. Overdosage: If you think you have taken too much of this medicine contact a poison control center or emergency room at once. NOTE: This medicine is only for you. Do not share this medicine with others. What if I miss a dose? If you miss a dose, take it as soon as you can. If it is almost time for your next dose, take only that dose. Do not take double or extra doses. What may interact with this medication? This medication may interact with the following: Digoxin This list may not describe all possible interactions. Give your health care provider a list of all the medicines, herbs, non-prescription drugs, or dietary supplements you use. Also tell them if you smoke, drink alcohol, or use illegal drugs. Some items may interact with your medicine. What should I watch for while using this medication? Visit your care team for regular checks on your progress. Tell your care team if your symptoms do not start to get better or if they get worse. You may need to limit your intake of tea, coffee, caffeinated  drinks, or alcohol. These drinks may make your symptoms worse. What side effects may I notice from receiving this medication? Side effects that you should report to your care team as soon as possible: Allergic reactions--skin rash, itching, hives, swelling of the face, lips, tongue, or throat Trouble passing urine Side effects that usually do not require medical attention (report to your care team if they continue or are bothersome): Diarrhea Headache Nausea Runny or stuffy nose Sore throat This list may not describe all possible side effects. Call your doctor for medical advice about side effects. You may report side effects to FDA at 1-800-FDA-1088. Where should I keep my medication? Keep out of the reach of children and pets. Store at room temperature between 20 and 25 degrees C (68 and 77 degrees F). Get rid of any unused medication after the expiration date. To get rid of medications that are no longer needed or have expired: Take the medication  to a medication take-back program. Check with your pharmacy or law enforcement to find a location. If you cannot return the medication, check the label or package insert to see if the medication should be thrown out in the garbage or flushed down the toilet. If you are not sure, ask your care team. If it is safe to put it in the trash, empty the medication out of the container. Mix the medication with cat litter, dirt, coffee grounds, or other unwanted substance. Seal the mixture in a bag or container. Put it in the trash. NOTE: This sheet is a summary. It may not cover all possible information. If you have questions about this medicine, talk to your doctor, pharmacist, or health care provider.  2023 Elsevier/Gold Standard (2021-03-09 00:00:00)

## 2022-01-26 ENCOUNTER — Encounter: Payer: Self-pay | Admitting: Obstetrics and Gynecology

## 2022-01-26 NOTE — Telephone Encounter (Signed)
PA done via cover my meds, pending response from insurance.

## 2022-01-27 NOTE — Telephone Encounter (Signed)
PA Case: [id number], Status: Approved, Coverage Starts on: 01/26/2022 12:00:00 AM, Coverage Ends on: 01/26/2023 12:00:00 AM.

## 2022-02-07 ENCOUNTER — Other Ambulatory Visit: Payer: Self-pay | Admitting: Nurse Practitioner

## 2022-02-07 DIAGNOSIS — J209 Acute bronchitis, unspecified: Secondary | ICD-10-CM

## 2022-02-07 DIAGNOSIS — G8929 Other chronic pain: Secondary | ICD-10-CM

## 2022-02-08 ENCOUNTER — Encounter: Payer: Self-pay | Admitting: Nurse Practitioner

## 2022-02-08 ENCOUNTER — Other Ambulatory Visit: Payer: Self-pay

## 2022-02-08 DIAGNOSIS — F3342 Major depressive disorder, recurrent, in full remission: Secondary | ICD-10-CM

## 2022-02-08 DIAGNOSIS — I10 Essential (primary) hypertension: Secondary | ICD-10-CM

## 2022-02-08 MED ORDER — MELOXICAM 15 MG PO TABS: 15.0000 mg | ORAL_TABLET | Freq: Every day | ORAL | 6 refills | Status: AC | PRN

## 2022-02-08 MED ORDER — LISINOPRIL-HYDROCHLOROTHIAZIDE 20-25 MG PO TABS: 1.0000 | ORAL_TABLET | Freq: Every day | ORAL | 3 refills | Status: DC

## 2022-02-08 MED ORDER — VILAZODONE HCL 40 MG PO TABS: 40.0000 mg | ORAL_TABLET | Freq: Every day | ORAL | 3 refills | Status: AC

## 2022-02-08 MED ORDER — BENZONATATE 200 MG PO CAPS: 200.0000 mg | ORAL_CAPSULE | Freq: Every day | ORAL | 0 refills | Status: DC | PRN

## 2022-02-14 ENCOUNTER — Other Ambulatory Visit: Payer: Self-pay | Admitting: Nurse Practitioner

## 2022-02-14 DIAGNOSIS — G8929 Other chronic pain: Secondary | ICD-10-CM

## 2022-02-16 ENCOUNTER — Encounter: Payer: Self-pay | Admitting: Nurse Practitioner

## 2022-02-17 ENCOUNTER — Other Ambulatory Visit: Payer: Self-pay | Admitting: Nurse Practitioner

## 2022-02-18 ENCOUNTER — Telehealth: Payer: Self-pay | Admitting: Orthopaedic Surgery

## 2022-02-18 NOTE — Telephone Encounter (Signed)
Patient nees a new rx for gabapentin. Please advise..[phone]

## 2022-02-21 ENCOUNTER — Other Ambulatory Visit: Payer: Self-pay | Admitting: Obstetrics and Gynecology

## 2022-03-02 NOTE — Progress Notes (Deleted)
GYNECOLOGY  VISIT   HPI: 62 y.o.   Married  Caucasian  female   G0P0 with No LMP recorded. (Menstrual status: IUD).   here for   8 week f/u  GYNECOLOGIC HISTORY: No LMP recorded. (Menstrual status: IUD). Contraception: vasectomy/ IUD--Mirena 03/04/16 Menopausal hormone therapy:  n/a Last mammogram:  01/01/22 Breast Density Category B, BI-RADS CATEGORY 1 Negative Last pap smear:   12/23/20 negative: HR HPV negative, 12/03/16 negative: HR HPV negative        OB History     Gravida  0   Para      Term      Preterm      AB      Living         SAB      IAB      Ectopic      Multiple      Live Births                 Patient Active Problem List   Diagnosis Date Noted   Recurrent major depressive disorder, in full remission ([place]) 08/07/2021   GAD (generalized anxiety disorder) 01/05/2021   Abnormal uterine bleeding 01/05/2021   Osteoarthritis    Chronic neck pain 11/23/2016   Senile osteoporosis 04/02/2015   Gastroesophageal reflux disease 03/25/2015   BMI 40.0-44.9, adult ([place]) 02/12/2012   Vitamin D insufficiency 12/15/2010   h/o OSA (obstructive sleep apnea) 08/11/2010   Mixed hyperlipidemia 07/28/2010   Essential hypertension 07/28/2010    Past Medical History:  Diagnosis Date   Anxiety    NO MEDS   Arthritis    Back pain    Cervical cancer screening 07/20/2012   Menarche at 13, regular til recently No h/o abnormal paps, last pap 2-3 years ago G0P0  MGM today, no h/o abnormal MGMs Spotting now monthly and scant H/o uterine polyps   Condyloma acuminata 2020   Depression    counseling in 2010 for 2 months on medication   Fibrocystic breast 1988   Fibroids 2014   Fracture of right fibula 05/19/2015   October 2016   GERD (gastroesophageal reflux disease) 2005   treated with omeprazole   High cholesterol 2002   History of sinusitis    once/twice per year   Hyperglycemia 05/19/2015   Hypertension 2002   Joint pain    MRSA (methicillin resistant staph  aureus) culture positive 2007   Muscular pain    Obesity    OSA (obstructive sleep apnea)    +sleep study 08/2010., DOES NOT USE CPAP, USES MOSES MOUTH PIECE   Osteoarthritis    KNEES, HANDS, HIPS   Osteoporosis    Plantar fasciitis of left foot 03/2014   Seasonal allergies    affected in the spring   Sleep apnea    mouthpiece "[place]"   Uterine polyp    Uterine polyp 05/19/2015   Recurrent Managed by Dr Rolly Pancake   Vitamin D deficiency    Wrist fracture 07/2012   right    Past Surgical History:  Procedure Laterality Date   DILATATION & CURETTAGE/HYSTEROSCOPY WITH MYOSURE N/A 07/30/2014   Procedure: DILATATION & CURETTAGE/HYSTEROSCOPY WITH MYOSURE;  Surgeon: Nunzio Cobbs, MD;  Location: [facility];  Service: Gynecology;  Laterality: N/A;  extra time for BMI 49.   DILATATION & CURRETTAGE/HYSTEROSCOPY WITH RESECTOCOPE N/A 11/24/2012   Procedure: DILATATION & CURETTAGE/HYSTEROSCOPY WITH RESECTOCOPE;  Surgeon: Arloa Koh, MD;  Location: [facility];  Service: Gynecology;  Laterality: N/A;  ESSURE TUBAL LIGATION Left    TONSILLECTOMY  1969   WISDOM TOOTH EXTRACTION  1979   WRIST FRACTURE SURGERY Right 07/2012    Current Outpatient Medications  Medication Sig Dispense Refill   benzonatate (TESSALON) 200 MG capsule Take 1 capsule (200 mg total) by mouth daily as needed. 30 capsule 0   busPIRone (BUSPAR) 7.5 MG tablet Take 1 tablet (7.5 mg total) by mouth 2 (two) times daily. 180 tablet 1   CALCIUM PO Take 1 tablet by mouth 2 (two) times daily.      Coenzyme Q10 (CO Q 10 PO) Take by mouth daily.     cyclobenzaprine (FLEXERIL) 10 MG tablet TAKE ONE TABLET BY MOUTH ONE TIME DAILY AS NEEDED 90 tablet 0   gabapentin (NEURONTIN) 300 MG capsule TAKE 1 CAPSULE BY MOUTH AT  BEDTIME 90 capsule 3   levonorgestrel (MIRENA) 20 MCG/24HR IUD 1 each by Intrauterine route once.     lisinopril-hydrochlorothiazide (ZESTORETIC) 20-25 MG tablet Take 1 tablet by mouth daily. 90 tablet 3   LORazepam  (ATIVAN) 1 MG tablet 1/2 to 1 tab po daily PRN anxiety 30 tablet 1   MAGNESIUM PO Take 500 mg by mouth.     Melatonin 3 MG TABS Take 1 tablet by mouth at bedtime.     meloxicam (MOBIC) 15 MG tablet Take 1 tablet (15 mg total) by mouth daily as needed for pain. 90 tablet 6   montelukast (SINGULAIR) 10 MG tablet Take 1 tablet (10 mg total) by mouth at bedtime. 90 tablet 3   Multiple Vitamin (MULTIVITAMIN) tablet Take 1 tablet by mouth daily.     mupirocin cream (BACTROBAN) 2 % Apply 1 application topically as needed. 15 g 1   Omega-3 Fatty Acids (FISH OIL) 1000 MG CAPS Take 1,000 mg by mouth daily.     omeprazole (PRILOSEC) 20 MG capsule Take 20 mg by mouth daily as needed.     POTASSIUM PO Take 99 mg by mouth.     Probiotic Product (PROBIOTIC PO) Take by mouth.     sodium chloride (OCEAN) 0.65 % SOLN nasal spray Place 1 spray into both nostrils as needed for congestion. 15 mL 0   sulfamethoxazole-trimethoprim (BACTRIM DS) 800-160 MG tablet Take 1 tablet by mouth 2 (two) times daily. One PO BID x 3 days 14 tablet 0   triamcinolone (NASACORT) 55 MCG/ACT AERO nasal inhaler Place 2 sprays into the nose daily.     TURMERIC PO Take by mouth.     Vibegron (GEMTESA) 75 MG TABS Take 75 mg by mouth daily. 30 tablet 1   Vilazodone HCl (VIIBRYD) 40 MG TABS Take 1 tablet (40 mg total) by mouth daily. 90 tablet 3   Vitamin D, Ergocalciferol, (DRISDOL) 1.25 MG (50000 UT) CAPS capsule Take 1 capsule (50,000 Units total) by mouth every 7 (seven) days. 12 capsule 3   No current facility-administered medications for this visit.     ALLERGIES: Bupropion  Family History  Problem Relation Age of Onset   Osteoarthritis Mother    Hyperlipidemia Mother    Depression Mother    Alzheimer's disease Mother 27   Hypertension Mother    Sleep apnea Mother    Obesity Mother    Hyperlipidemia Father    Coronary artery disease Father    Hypertension Father    Stroke Father        multiple   Diabetes Father     Obesity Father    Sleep apnea Father  Liver cancer Maternal Grandmother        mets to breast   Breast cancer Maternal Grandmother        liver mets to breast   Cancer Maternal Grandmother        with mets   Colon cancer Neg Hx     Social History   Socioeconomic History   Marital status: Married    Spouse name: Kylinn Dilullo   Number of children: Not on file   Years of education: Not on file   Highest education level: Not on file  Occupational History   Not on file  Tobacco Use   Smoking status: Former    Packs/day: 0.50    Years: 6.00    Total pack years: 3.00    Types: Cigarettes    Quit date: 03/01/1989    Years since quitting: 33.0   Smokeless tobacco: Never   Tobacco comments:    Quit in 1991- started in 1983 1/2ppd  Vaping Use   Vaping Use: Never used  Substance and Sexual Activity   Alcohol use: Yes    Alcohol/week: 0.0 standard drinks of alcohol    Comment: 2 drinks/month   Drug use: No   Sexual activity: Not Currently    Partners: Male    Birth control/protection: Other-see comments, I.U.D.    Comment: vasectomy/Mirena IUD inserted 03-04-16  Other Topics Concern   Not on file  Social History Narrative   Not on file   Social Determinants of Health   Financial Resource Strain: Not on file  Food Insecurity: Not on file  Transportation Needs: Not on file  Physical Activity: Not on file  Stress: Not on file  Social Connections: Not on file  Intimate Partner Violence: Not on file    Review of Systems  PHYSICAL EXAMINATION:    There were no vitals taken for this visit.    General appearance: alert, cooperative and appears stated age Head: Normocephalic, without obvious abnormality, atraumatic Neck: no adenopathy, supple, symmetrical, trachea midline and thyroid normal to inspection and palpation Lungs: clear to auscultation bilaterally Breasts: normal appearance, no masses or tenderness, No nipple retraction or dimpling, No nipple discharge or bleeding,  No axillary or supraclavicular adenopathy Heart: regular rate and rhythm Abdomen: soft, non-tender, no masses,  no organomegaly Extremities: extremities normal, atraumatic, no cyanosis or edema Skin: Skin color, texture, turgor normal. No rashes or lesions Lymph nodes: Cervical, supraclavicular, and axillary nodes normal. No abnormal inguinal nodes palpated Neurologic: Grossly normal  Pelvic: External genitalia:  no lesions              Urethra:  normal appearing urethra with no masses, tenderness or lesions              Bartholins and Skenes: normal                 Vagina: normal appearing vagina with normal color and discharge, no lesions              Cervix: no lesions                Bimanual Exam:  Uterus:  normal size, contour, position, consistency, mobility, non-tender              Adnexa: no mass, fullness, tenderness              Rectal exam: {yes no:[id number]}.  Confirms.              Anus:  normal sphincter tone,  no lesions  Chaperone was present for exam:  ***  ASSESSMENT     PLAN     An After Visit Summary was printed and given to the patient.  ______ minutes face to face time of which over 50% was spent in counseling.

## 2022-03-04 ENCOUNTER — Encounter: Payer: Self-pay | Admitting: Nurse Practitioner

## 2022-03-04 ENCOUNTER — Ambulatory Visit: Payer: No Typology Code available for payment source | Admitting: Nurse Practitioner

## 2022-03-04 VITALS — BP 130/92 | HR 90 | Temp 97.2°F | Ht 63.0 in | Wt 256.0 lb

## 2022-03-04 DIAGNOSIS — J3089 Other allergic rhinitis: Secondary | ICD-10-CM

## 2022-03-04 DIAGNOSIS — M542 Cervicalgia: Secondary | ICD-10-CM

## 2022-03-04 DIAGNOSIS — I10 Essential (primary) hypertension: Secondary | ICD-10-CM

## 2022-03-04 DIAGNOSIS — F3342 Major depressive disorder, recurrent, in full remission: Secondary | ICD-10-CM

## 2022-03-04 DIAGNOSIS — F411 Generalized anxiety disorder: Secondary | ICD-10-CM

## 2022-03-04 DIAGNOSIS — Z6841 Body Mass Index (BMI) 40.0 and over, adult: Secondary | ICD-10-CM

## 2022-03-04 DIAGNOSIS — G8929 Other chronic pain: Secondary | ICD-10-CM

## 2022-03-04 LAB — BASIC METABOLIC PANEL
BUN: 13 mg/dL (ref 6–23)
CO2: 33 mEq/L — ABNORMAL HIGH (ref 19–32)
Calcium: 9.9 mg/dL (ref 8.4–10.5)
Chloride: 100 mEq/L (ref 96–112)
Creatinine, Ser: 0.88 mg/dL (ref 0.40–1.20)
GFR: 71.14 mL/min (ref >60.00)
Glucose, Bld: 87 mg/dL (ref 70–99)
Potassium: 4.2 mEq/L (ref 3.5–5.1)
Sodium: 139 mEq/L (ref 135–145)

## 2022-03-04 MED ORDER — LORAZEPAM 1 MG PO TABS: ORAL_TABLET | ORAL | 0 refills | Status: AC

## 2022-03-04 MED ORDER — CYCLOBENZAPRINE HCL 10 MG PO TABS: 10.0000 mg | ORAL_TABLET | Freq: Two times a day (BID) | ORAL | 1 refills | Status: AC

## 2022-03-04 MED ORDER — MONTELUKAST SODIUM 10 MG PO TABS: 10.0000 mg | ORAL_TABLET | Freq: Every day | ORAL | 3 refills | Status: AC

## 2022-03-04 NOTE — Assessment & Plan Note (Signed)
Use of flexeril BID for neck muscle spasm. Med refill sent

## 2022-03-04 NOTE — Assessment & Plan Note (Addendum)
Waxing and waning mood, no SI/HI or hallucination, denies need for psychology referral. Previous use of wellbutrin (flat affect), prozac, and lexapro, paxil, zoloft, vistaril. These were ineffective.  Maintain current dose of viibryd and buspar F/up in 72month

## 2022-03-04 NOTE — Assessment & Plan Note (Signed)
Refilled montelukast

## 2022-03-04 NOTE — Progress Notes (Signed)
Established Patient Visit  Patient: Alejandra Miller   DOB: August 17, 1960   62 y.o. Female  MRN: [id number] Visit Date: 03/04/2022  Subjective:    Chief Complaint  Patient presents with   Office Visit    F/up and Med refill: montelukast, flexeril and lorazepam   HPI Recurrent major depressive disorder, in full remission ([place]) Waxing and waning mood, no SI/HI or hallucination, denies need for psychology referral. Previous use of wellbutrin (flat affect), prozac, and lexapro, paxil, zoloft, vistaril. These were ineffective.  Maintain current dose of viibryd and buspar F/up in 49month    GAD (generalized anxiety disorder) Use of lorazepam 0.5-'1mg'$  daily as needed for social anxiety- social gathering with multiple people. Last filled 2022 by previous pcp.  Med refill sent, informed about reduced med quantity.  Non-seasonal allergic rhinitis Refilled montelukast  Essential hypertension Waxing and waning BP Home BP reading 130s/80s BP Readings from Last 3 Encounters:  03/04/22 (!) 130/92  01/14/22 118/70  09/11/21 (!) 164/102    Advised about importance of DASH diet and daily exercise, about possible adverse effects of uncontrolled BP. F/up in 1-334month Chronic neck pain Use of flexeril BID for neck muscle spasm. Med refill sent   Reviewed medical, surgical, and social history today  Medications: Outpatient Medications Prior to Visit  Medication Sig   benzonatate (TESSALON) 200 MG capsule Take 1 capsule (200 mg total) by mouth daily as needed.   busPIRone (BUSPAR) 7.5 MG tablet Take 1 tablet (7.5 mg total) by mouth 2 (two) times daily.   CALCIUM PO Take 1 tablet by mouth 2 (two) times daily.    Coenzyme Q10 (CO Q 10 PO) Take by mouth daily.   gabapentin (NEURONTIN) 300 MG capsule TAKE 1 CAPSULE BY MOUTH AT  BEDTIME   levonorgestrel (MIRENA) 20 MCG/24HR IUD 1 each by Intrauterine route once.   lisinopril-hydrochlorothiazide (ZESTORETIC) 20-25 MG tablet Take 1  tablet by mouth daily.   MAGNESIUM PO Take 500 mg by mouth.   Melatonin 3 MG TABS Take 1 tablet by mouth at bedtime.   meloxicam (MOBIC) 15 MG tablet Take 1 tablet (15 mg total) by mouth daily as needed for pain.   Multiple Vitamin (MULTIVITAMIN) tablet Take 1 tablet by mouth daily.   mupirocin cream (BACTROBAN) 2 % Apply 1 application topically as needed.   Omega-3 Fatty Acids (FISH OIL) 1000 MG CAPS Take 1,000 mg by mouth daily.   omeprazole (PRILOSEC) 20 MG capsule Take 20 mg by mouth daily as needed.   POTASSIUM PO Take 99 mg by mouth.   Probiotic Product (PROBIOTIC PO) Take by mouth.   sodium chloride (OCEAN) 0.65 % SOLN nasal spray Place 1 spray into both nostrils as needed for congestion.   triamcinolone (NASACORT) 55 MCG/ACT AERO nasal inhaler Place 2 sprays into the nose daily.   TURMERIC PO Take by mouth.   Vibegron (GEMTESA) 75 MG TABS Take 75 mg by mouth daily.   Vilazodone HCl (VIIBRYD) 40 MG TABS Take 1 tablet (40 mg total) by mouth daily.   Vitamin D, Ergocalciferol, (DRISDOL) 1.25 MG (50000 UT) CAPS capsule Take 1 capsule (50,000 Units total) by mouth every 7 (seven) days.   [DISCONTINUED] cyclobenzaprine (FLEXERIL) 10 MG tablet TAKE ONE TABLET BY MOUTH ONE TIME DAILY AS NEEDED   [DISCONTINUED] LORazepam (ATIVAN) 1 MG tablet 1/2 to 1 tab po daily PRN anxiety   sulfamethoxazole-trimethoprim (BACTRIM DS) 800-160 MG tablet  Take 1 tablet by mouth 2 (two) times daily. One PO BID x 3 days (Patient not taking: Reported on 03/04/2022)   [DISCONTINUED] montelukast (SINGULAIR) 10 MG tablet Take 1 tablet (10 mg total) by mouth at bedtime. (Patient not taking: Reported on 03/04/2022)   No facility-administered medications prior to visit.   Reviewed past medical and social history.   ROS per HPI above      Objective:  BP (!) 130/92   Pulse 90   Temp (!) 97.2 F (36.2 C) (Temporal)   Ht '5\' 3"'$  (1.6 m)   Wt 256 lb (116.1 kg)   SpO2 96%   BMI 45.35 kg/m      Physical Exam  No  results found for any visits on 03/04/22.    Assessment & Plan:    Problem List Items Addressed This Visit       Cardiovascular and Mediastinum   Essential hypertension - Primary    Waxing and waning BP Home BP reading 130s/80s BP Readings from Last 3 Encounters:  03/04/22 (!) 130/92  01/14/22 118/70  09/11/21 (!) 164/102    Advised about importance of DASH diet and daily exercise, about possible adverse effects of uncontrolled BP. F/up in 1-53month      Relevant Orders   Basic metabolic panel     Respiratory   Non-seasonal allergic rhinitis    Refilled montelukast      Relevant Medications   montelukast (SINGULAIR) 10 MG tablet     Other   BMI 40.0-44.9, adult ([facility]) (Chronic)   Chronic neck pain    Use of flexeril BID for neck muscle spasm. Med refill sent      Relevant Medications   cyclobenzaprine (FLEXERIL) 10 MG tablet   GAD (generalized anxiety disorder)    Use of lorazepam 0.5-'1mg'$  daily as needed for social anxiety- social gathering with multiple people. Last filled 2022 by previous pcp.  Med refill sent, informed about reduced med quantity.      Relevant Medications   LORazepam (ATIVAN) 1 MG tablet   Rec[place] major depressive disorder, [place] full [place]    Waxing and waning mood, no SI/HI or hallucination, denies need for psychology referral. Previous use of wellbutrin (flat affect), prozac, and lexapro, paxil, zoloft, vistaril. These were ineffective.  Maintain current dose of viibryd and buspar F/up in 364month       Relevant Medications   LORazepam (ATIVAN) 1 MG tablet   Return in about 3 months (around 06/03/2022) for HTN, Weight management, hyperlipidemia (fasting).     ChWilfred LacyNP

## 2022-03-04 NOTE — Assessment & Plan Note (Signed)
Use of lorazepam 0.5-'1mg'$  daily as needed for social anxiety- social gathering with multiple people. Last filled 2022 by previous pcp.  Med refill sent, informed about reduced med quantity.

## 2022-03-04 NOTE — Assessment & Plan Note (Signed)
Waxing and waning BP Home BP reading 130s/80s BP Readings from Last 3 Encounters:  03/04/22 (!) 130/92  01/14/22 118/70  09/11/21 (!) 164/102    Advised about importance of DASH diet and daily exercise, about possible adverse effects of uncontrolled BP. F/up in 1-61month

## 2022-03-04 NOTE — Patient Instructions (Signed)
Go to lab Monitor BP at home 2-3x/week in AM Bring BP readings to next appt. Maintain DASH diet and start daily exercise (low impact: walking, strength training)

## 2022-03-05 ENCOUNTER — Other Ambulatory Visit: Payer: Self-pay

## 2022-03-05 DIAGNOSIS — R7303 Prediabetes: Secondary | ICD-10-CM

## 2022-03-10 ENCOUNTER — Encounter: Payer: Self-pay | Admitting: Obstetrics and Gynecology

## 2022-03-10 ENCOUNTER — Other Ambulatory Visit: Payer: Self-pay | Admitting: Obstetrics and Gynecology

## 2022-03-10 MED ORDER — GEMTESA 75 MG PO TABS: 75.0000 mg | ORAL_TABLET | Freq: Every day | ORAL | 9 refills | Status: DC

## 2022-03-10 NOTE — Telephone Encounter (Signed)
Please cancel patient appointment for 03/15/22.  It is no longer needed. See My Chart message from today, 03/10/22.

## 2022-03-10 NOTE — Telephone Encounter (Signed)
Message sent to appt desk to cancel appt.

## 2022-03-10 NOTE — Progress Notes (Signed)
Refill of Gemtesa.

## 2022-03-11 ENCOUNTER — Ambulatory Visit: Payer: No Typology Code available for payment source | Admitting: Obstetrics and Gynecology

## 2022-03-12 ENCOUNTER — Ambulatory Visit: Payer: No Typology Code available for payment source | Admitting: Nurse Practitioner

## 2022-03-15 ENCOUNTER — Ambulatory Visit: Payer: No Typology Code available for payment source | Admitting: Obstetrics and Gynecology

## 2022-03-17 ENCOUNTER — Encounter: Payer: Self-pay | Admitting: Orthopaedic Surgery

## 2022-03-17 ENCOUNTER — Ambulatory Visit (INDEPENDENT_AMBULATORY_CARE_PROVIDER_SITE_OTHER): Payer: No Typology Code available for payment source | Admitting: Orthopaedic Surgery

## 2022-03-17 DIAGNOSIS — M5412 Radiculopathy, cervical region: Secondary | ICD-10-CM

## 2022-03-17 MED ORDER — GABAPENTIN 300 MG PO CAPS: 300.0000 mg | ORAL_CAPSULE | Freq: Every day | ORAL | 3 refills | Status: DC

## 2022-03-17 NOTE — Progress Notes (Signed)
The patient is a 62 year old female that we have seen before.  She is someone who has been on gabapentin in the past taking it at night mainly for numbness and tingling in her upper extremities.  She is right-hand dominant.  She has numbness worse on the right side than the left side.  In May 2022 we did obtain an MRI of her lumbar spine that showed multifactorial degenerative changes with moderate right and severe right foraminal stenosis with moderate being at C3 and severe at C5.  There is also moderate left C6-7 foraminal narrowing.  She says things have not worsened at all but she still gets numbness and tingling.  On exam there is no weakness of her upper extremities fortunately.  She still does have some of the numbness and tingling.  Her neck exam shows good motion.  We will refill the gabapentin.  I would like to see her back in 3 months to see how she is doing overall.  Occasionally she can take this during the daytime but usually she will just take it at bedtime since there is been no change in her status.

## 2022-03-27 ENCOUNTER — Other Ambulatory Visit: Payer: Self-pay | Admitting: Nurse Practitioner

## 2022-03-27 DIAGNOSIS — J209 Acute bronchitis, unspecified: Secondary | ICD-10-CM

## 2022-04-04 ENCOUNTER — Encounter: Payer: Self-pay | Admitting: Nurse Practitioner

## 2022-04-04 ENCOUNTER — Other Ambulatory Visit: Payer: Self-pay | Admitting: Nurse Practitioner

## 2022-04-04 DIAGNOSIS — F411 Generalized anxiety disorder: Secondary | ICD-10-CM

## 2022-04-04 DIAGNOSIS — J209 Acute bronchitis, unspecified: Secondary | ICD-10-CM

## 2022-04-05 ENCOUNTER — Encounter: Payer: Self-pay | Admitting: Nurse Practitioner

## 2022-04-05 MED ORDER — BUSPIRONE HCL 7.5 MG PO TABS: 7.5000 mg | ORAL_TABLET | Freq: Two times a day (BID) | ORAL | 3 refills | Status: DC

## 2022-04-05 MED ORDER — BENZONATATE 200 MG PO CAPS: 200.0000 mg | ORAL_CAPSULE | Freq: Every day | ORAL | 0 refills | Status: DC | PRN

## 2022-05-06 ENCOUNTER — Encounter: Payer: Self-pay | Admitting: Radiology

## 2022-06-03 ENCOUNTER — Ambulatory Visit: Payer: No Typology Code available for payment source | Admitting: Nurse Practitioner

## 2022-06-16 ENCOUNTER — Ambulatory Visit: Payer: No Typology Code available for payment source | Admitting: Orthopaedic Surgery

## 2022-06-16 ENCOUNTER — Encounter: Payer: Self-pay | Admitting: Orthopaedic Surgery

## 2022-06-16 DIAGNOSIS — M5412 Radiculopathy, cervical region: Secondary | ICD-10-CM | POA: Diagnosis not present

## 2022-06-16 MED ORDER — GABAPENTIN 300 MG PO CAPS: 300.0000 mg | ORAL_CAPSULE | Freq: Every day | ORAL | 3 refills | Status: DC

## 2022-06-16 NOTE — Progress Notes (Signed)
The patient is someone I saw last 3 months ago as a relates to neck pain with numbness and tingling going down her left arm.  She says the gabapentin has still been very helpful and useful for her.  She says physical therapy did not really help much and she does take anti-inflammatories on occasion.  She does want to have the gabapentin refilled.  She has not had any worsening in her symptoms but she still gets the same radicular symptoms with numbness and tingling and pain going down her left arm.  She denies any significant weakness.  On exam she has excellent strength in her bilateral upper extremities.  I do feel that there is some slight weakness in the triceps and grip on the left side when comparing the left and right side but she is right-hand dominant.  She has pretty good range of motion of her cervical spine as well and I could not elicit any nerve compression from her neck today when assessing the Spurling sign.  The MRI of her cervical spine from May 2022 showed multifactorial degenerative changes.  She did have moderate to severe right foraminal stenosis at C5 and some left narrowing at C7.  She still seems to be asymptomatic on the right side.  At this point I will refill her gabapentin but I would like to have her see my partner Dr. Christell Constant for his opinion of potentially other treatment options and she would like to at least see what a spine specialist has this a from that standpoint.  We will make that referral.

## 2022-06-25 ENCOUNTER — Ambulatory Visit: Payer: No Typology Code available for payment source | Admitting: Orthopedic Surgery

## 2022-06-28 ENCOUNTER — Ambulatory Visit: Payer: No Typology Code available for payment source | Admitting: Orthopedic Surgery

## 2022-07-08 ENCOUNTER — Ambulatory Visit: Payer: No Typology Code available for payment source | Admitting: Orthopedic Surgery

## 2022-07-09 ENCOUNTER — Other Ambulatory Visit (INDEPENDENT_AMBULATORY_CARE_PROVIDER_SITE_OTHER): Payer: No Typology Code available for payment source

## 2022-07-09 ENCOUNTER — Ambulatory Visit: Payer: No Typology Code available for payment source | Admitting: Orthopedic Surgery

## 2022-07-09 VITALS — BP 140/91 | HR 79 | Ht 63.0 in | Wt 256.0 lb

## 2022-07-09 DIAGNOSIS — M5412 Radiculopathy, cervical region: Secondary | ICD-10-CM

## 2022-07-09 DIAGNOSIS — M542 Cervicalgia: Secondary | ICD-10-CM | POA: Diagnosis not present

## 2022-07-09 NOTE — Progress Notes (Signed)
Orthopedic Spine Surgery Office Note  Assessment: Patient is a 62 y.o. 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  Recommended holding off on an injection and using it if pain were to become more significant -Would need a new MRI if ever considering surgery -Patient should return to office on an as-needed basis   Patient expressed understanding of the plan and all questions were answered to the patient's satisfaction.   ___________________________________________________________________________   History:  Patient is a 62 y.o. female who presents today for cervical spine.  Patient has had pain radiating down the lateral aspect of her arm for over a year now.  It starts on the left side of her neck and radiates into the lateral aspect of her arm, into the ulnar aspect of the forearm, and finally into the ulnar 2 digits on the left hand.  She gets paresthesias in the same distribution.  She describes the pain as achy in nature.  There is no trauma or injury that preceded the onset of pain.  She does not have any pain radiating down the right upper extremity.  Her pain is currently tolerable with a combination of meloxicam and gabapentin   Weakness: Denies Difficulty with fine motor skills (e.g., buttoning shirts, handwriting): Yes, chronic has had issues with clumsiness in her hands since her 32s.  No recent changes Symptoms of imbalance: Sometimes feels off balance but has had this for years.  No consistent unsteadiness or progression of the symptoms Paresthesias and numbness: Yes, along the lateral aspect of the left arm into the left ulnar forearm and into the ulnar 2 digits on the left hand.  No other numbness or paresthesias. Bowel or  bladder incontinence: Yes, has urge incontinence that is being treated.  No other bowel or bladder incontinence Saddle anesthesia: Denies  Treatments tried: PT, Tylenol, meloxicam, gabapentin  Review of systems: Denies fevers and chills, night sweats, unexplained weight loss, history of cancer.  Has had pain that wakes her at night  Past medical history: Osteoarthritis Hyperlipidemia Hypertension Depression/anxiety GERD Osteoporosis (treated with Forteo) OSA  Allergies: Bupropion  Past surgical history:  Tonsillectomy Right wrist fracture ORIF Right ankle fracture ORIF Uterine polypectomy Tubal ligation  Social history: Denies use of nicotine product (smoking, vaping, patches, smokeless) Alcohol use: Denies Denies recreational drug use  Physical Exam:  General: no acute distress, appears stated age Neurologic: alert, answering questions appropriately, following commands Respiratory: unlabored breathing on room air, symmetric chest rise Psychiatric: appropriate affect, normal cadence to speech   MSK (spine):  -Strength exam      Left  Right Grip strength               5/5  5/5 Interosseus   5/5   5/5 Wrist extension  5/5  5/5 Wrist flexion   5/5  5/5 Elbow flexion   5/5  5/5 Deltoid    5/5  5/5  EHL    5/5  5/5 TA    5/5  5/5 GSC    5/5  5/5 Knee extension  5/5  5/5 Hip flexion   5/5  5/5  -Sensory exam    Sensation intact to light touch in L3-S1 nerve distributions of bilateral lower extremities  Sensation intact to light touch in C5-T1 nerve distributions of bilateral upper extremities  -  Brachioradialis DTR: 2/4 on the left, 2/4 on the right -Biceps DTR: 2/4 on the left, 2/4 on the right -Achilles DTR: 2/4 on the left, 2/4 on the right -Patellar tendon DTR: 2/4 on the left, 2/4 on the right  -Spurling: Negative bilaterally -Hoffman sign: Negative bilaterally -Clonus: No beats bilaterally -Interosseous wasting: None seen -Grip and release test:  Negative -Romberg: Negative -Gait: Normal  Left shoulder exam: No pain through range of motion Right shoulder exam: No pain through range of motion  Tinel's at wrist: Negative bilaterally Phalen's at wrist: Negative bilaterally Durkan's: Negative bilaterally  Tinel's at elbow: Negative bilaterally  Imaging: XR of the cervical spine from 07/09/2022 was independently reviewed and interpreted, showing C5/6 disc height loss with anterior osteophyte formation.  No evidence of instability on flexion/extension views.  No fracture or dislocation seen.  MRI of the cervical spine from 07/26/2020 was independently reviewed and interpreted, showing right-sided foraminal stenosis at C3/4, bilateral foraminal stenosis at C5/6, left-sided foraminal stenosis at C6/7.  No central stenosis.  No T2 cord signal change.   Patient name: Alejandra Miller Patient MRN: [id number] Date of visit: 07/09/22

## 2022-11-11 ENCOUNTER — Other Ambulatory Visit: Payer: Self-pay | Admitting: Orthopaedic Surgery

## 2022-11-18 ENCOUNTER — Other Ambulatory Visit: Payer: Self-pay | Admitting: Obstetrics and Gynecology

## 2022-11-18 DIAGNOSIS — Z1231 Encounter for screening mammogram for malignant neoplasm of breast: Secondary | ICD-10-CM

## 2023-01-04 ENCOUNTER — Ambulatory Visit
Admission: RE | Admit: 2023-01-04 | Discharge: 2023-01-04 | Disposition: A | Discharge status: Home / Self Care | Payer: No Typology Code available for payment source | Source: Ambulatory Visit | Attending: Obstetrics and Gynecology | Admitting: Obstetrics and Gynecology

## 2023-01-04 DIAGNOSIS — Z1231 Encounter for screening mammogram for malignant neoplasm of breast: Secondary | ICD-10-CM

## 2023-01-06 NOTE — Progress Notes (Deleted)
62 y.o. G0P0 Married Caucasian female here for annual exam.    PCP: Nche, Bonna Gains, NP   No LMP recorded. (Menstrual status: IUD).           Sexually active: No.  The current method of family planning is vasectomy/IUD--Mirena 03/04/16.    Menopausal hormone therapy:  n/a Exercising: {yes no:314532}  {types:19826} Smoker:  no  OB History  Gravida Para Term Preterm AB Living  0            SAB IAB Ectopic Multiple Live Births                HEALTH MAINTENANCE:    Component Value Date/Time   DIAGPAP  12/23/2020 1500    - Negative for intraepithelial lesion or malignancy (NILM)   DIAGPAP  12/03/2016 0000    NEGATIVE FOR INTRAEPITHELIAL LESIONS OR MALIGNANCY.   HPVHIGH Negative 12/23/2020 1500   ADEQPAP  12/23/2020 1500    Satisfactory for evaluation. The presence or absence of an   ADEQPAP  12/23/2020 1500    endocervical/transformation zone component cannot be determined because   ADEQPAP of atrophy. 12/23/2020 1500    History of abnormal Pap or positive HPV:  no Mammogram: 01/04/23-- need result Colonoscopy:   Bone Density:  07/27/19  Result  ***   Immunization History  Administered Date(s) Administered   Influenza Split 12/06/2011   Influenza Whole 01/02/2013   Influenza,inj,Quad PF,6+ Mos 11/02/2013, 11/23/2016, 12/10/2017, 10/26/2018, 01/05/2021   Influenza-Unspecified 12/04/2014, 03/03/2016, 11/23/2016, 12/10/2017, 12/19/2019   PFIZER(Purple Top)SARS-COV-2 Vaccination 10/14/2019, 11/09/2019   Pneumococcal Conjugate-13 01/22/2013   Tdap 03/05/2002, 08/02/2012, 01/22/2013   Zoster Recombinant(Shingrix) 01/05/2021, 06/17/2021      reports that she quit smoking about 33 years ago. Her smoking use included cigarettes. She started smoking about 39 years ago. She has a 3 pack-year smoking history. She has never used smokeless tobacco. She reports current alcohol use. She reports that she does not use drugs.  Past Medical History:  Diagnosis Date   Anxiety    NO  MEDS   Arthritis    Back pain    Cervical cancer screening 07/20/2012   Menarche at 13, regular til recently No h/o abnormal paps, last pap 2-3 years ago G0P0  MGM today, no h/o abnormal MGMs Spotting now monthly and scant H/o uterine polyps   Condyloma acuminata 2020   Depression    counseling in 2010 for 2 months on medication   Fibrocystic breast 1988   Fibroids 2014   Fracture of right fibula 05/19/2015   October 2016   GERD (gastroesophageal reflux disease) 2005   treated with omeprazole   High cholesterol 2002   History of sinusitis    once/twice per year   Hyperglycemia 05/19/2015   Hypertension 2002   Joint pain    MRSA (methicillin resistant staph aureus) culture positive 2007   Muscular pain    Obesity    OSA (obstructive sleep apnea)    +sleep study 08/2010., DOES NOT USE CPAP, USES MOSES MOUTH PIECE   Osteoarthritis    KNEES, HANDS, HIPS   Osteoporosis    Plantar fasciitis of left foot 03/2014   Seasonal allergies    affected in the spring   Sleep apnea    mouthpiece "[facility]"   Uterine polyp    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   DILATATION & CURRETTAGE/HYSTEROSCOPY WITH RESECTOCOPE N/A 11/24/2012   Procedure: DILATATION & CURETTAGE/HYSTEROSCOPY WITH RESECTOCOPE;  Surgeon: Melony Overly, MD;  Location: [facility];  Service: Gynecology;  Laterality: N/A;   ESSURE TUBAL LIGATION Left    TONSILLECTOMY  1969   WISDOM TOOTH EXTRACTION  1979   WRIST FRACTURE SURGERY Right 07/2012    Current Outpatient Medications  Medication Sig Dispense Refill   benzonatate (TESSALON) 200 MG capsule  Take 1 capsule (200 mg total) by mouth daily as needed. 90 capsule 0   busPIRone (BUSPAR) 7.5 MG tablet Take 1 tablet (7.5 mg total) by mouth 2 (two) times daily. 180 tablet 3   CALCIUM PO Take 1 tablet by mouth 2 (two) times daily.      Coenzyme Q10 (CO Q 10 PO) Take by mouth daily.     cyclobenzaprine (FLEXERIL) 10 MG tablet Take 1 tablet (10 mg total) by mouth 2 (two) times daily. TAKE ONE TABLET BY MOUTH ONE TIME DAILY AS NEEDED 180 tablet 1   gabapentin (NEURONTIN) 300 MG capsule TAKE 1 CAPSULE BY MOUTH EVERY DAY AT BEDTIME 30 capsule 3   levonorgestrel (MIRENA) 20 MCG/24HR IUD 1 each by Intrauterine route once.     lisinopril-hydrochlorothiazide (ZESTORETIC) 20-25 MG tablet Take 1 tablet by mouth daily. 90 tablet 3   LORazepam (ATIVAN) 1 MG tablet 1/2 to 1 tab po daily PRN anxiety 15 tablet 0   MAGNESIUM PO Take 500 mg by mouth.     Melatonin 3 MG TABS Take 1 tablet by mouth at bedtime.     meloxicam (MOBIC) 15 MG tablet Take 1 tablet (15 mg total) by mouth daily as needed for pain. 90 tablet 6   montelukast (SINGULAIR) 10 MG tablet Take 1 tablet (10 mg total) by mouth at bedtime. 90 tablet 3   Multiple Vitamin (MULTIVITAMIN) tablet Take 1 tablet by mouth daily.     mupirocin cream (BACTROBAN) 2 % Apply 1 application topically as needed. 15 g 1   Omega-3 Fatty Acids (FISH OIL) 1000 MG CAPS Take 1,000 mg by mouth daily.     omeprazole (PRILOSEC) 20 MG capsule Take 20 mg by mouth daily as needed.     POTASSIUM PO Take 99 mg by mouth.     Probiotic Product (PROBIOTIC PO) Take by mouth.     sodium chloride (OCEAN) 0.65 % SOLN nasal spray Place 1 spray into both nostrils as needed for congestion. 15 mL 0   sulfamethoxazole-trimethoprim (BACTRIM DS) 800-160 MG tablet Take 1 tablet by mouth 2 (two) times daily. One PO BID x 3 days (Patient not taking: Reported on 03/04/2022) 14 tablet 0   triamcinolone (NASACORT) 55 MCG/ACT AERO nasal inhaler Place 2 sprays into the nose daily.     TURMERIC PO  Take by mouth.     Vibegron (GEMTESA) 75 MG TABS Take 75 mg by mouth daily. 30 tablet 9   Vilazodone HCl (VIIBRYD) 40 MG TABS Take 1 tablet (40 mg total) by mouth daily. 90 tablet 3   Vitamin D, Ergocalciferol, (DRISDOL) 1.25 MG (50000 UT) CAPS capsule Take 1 capsule (50,000 Units total) by mouth every 7 (seven) days. 12 capsule 3   No current facility-administered medications for this visit.  ALLERGIES: Bupropion  Family History  Problem Relation Age of Onset   Osteoarthritis Mother    Hyperlipidemia Mother    Depression Mother    Alzheimer's disease Mother 30   Hypertension Mother    Sleep apnea Mother    Obesity Mother    Hyperlipidemia Father    Coronary artery disease Father    Hypertension Father    Stroke Father        multiple   Diabetes Father    Obesity Father    Sleep apnea Father    Liver cancer Maternal Grandmother        mets to breast   Breast cancer Maternal Grandmother        liver mets to breast   Cancer Maternal Grandmother        with mets   Colon cancer Neg Hx     Review of Systems  PHYSICAL EXAM:  There were no vitals taken for this visit.    General appearance: alert, cooperative and appears stated age Head: normocephalic, without obvious abnormality, atraumatic Neck: no adenopathy, supple, symmetrical, trachea midline and thyroid normal to inspection and palpation Lungs: clear to auscultation bilaterally Breasts: normal appearance, no masses or tenderness, No nipple retraction or dimpling, No nipple discharge or bleeding, No axillary adenopathy Heart: regular rate and rhythm Abdomen: soft, non-tender; no masses, no organomegaly Extremities: extremities normal, atraumatic, no cyanosis or edema Skin: skin color, texture, turgor normal. No rashes or lesions Lymph nodes: cervical, supraclavicular, and axillary nodes normal. Neurologic: grossly normal  Pelvic: External genitalia:  no lesions              No abnormal inguinal nodes palpated.               Urethra:  normal appearing urethra with no masses, tenderness or lesions              Bartholins and Skenes: normal                 Vagina: normal appearing vagina with normal color and discharge, no lesions              Cervix: no lesions              Pap taken: {yes no:314532} Bimanual Exam:  Uterus:  normal size, contour, position, consistency, mobility, non-tender              Adnexa: no mass, fullness, tenderness              Rectal exam: {yes no:314532}.  Confirms.              Anus:  normal sphincter tone, no lesions  Chaperone was present for exam:  {BSCHAPERONE:31226::"Omarion Minnehan F, CMA"}  {LABS (Optional):23779}  ASSESSMENT: Well woman visit with gynecologic exam  ***  PLAN: Mammogram screening discussed. Self breast awareness reviewed. Pap and HRV collected:  {yes no:314532} Guidelines for Calcium, Vitamin D, regular exercise program including cardiovascular and weight bearing exercise. Medication refills:  *** Follow up:  ***    Additional counseling given.  {yes [id number]. ***  total time was spent for this patient encounter, including preparation, face-to-face counseling with the patient, coordination of care, and documentation of the encounter in addition to doing the well woman visit with gynecologic exam.   An After Visit Summary was provided to the patient.

## 2023-01-20 ENCOUNTER — Ambulatory Visit: Payer: No Typology Code available for payment source | Admitting: Obstetrics and Gynecology

## 2023-02-01 ENCOUNTER — Telehealth: Payer: Self-pay

## 2023-02-01 NOTE — Telephone Encounter (Signed)
Pt reports Claris Gower is no longer her PCP. Alysia Penna removed from chart as PCP.

## 2023-03-18 ENCOUNTER — Ambulatory Visit: Payer: No Typology Code available for payment source | Admitting: Family Medicine

## 2023-04-26 NOTE — Progress Notes (Signed)
 64 y.o. G0P0 Married Caucasian female here for annual exam.    Taking Gemtesa for overactive bladder.  It is working well.  Emptying well.  Leaks urine only with a hard cough.   No vaginal bleeding or pain.   Dx with pneumonia in December.   She and her husband took in a stray dog who passed away 2 months later.   PCP: Madaline Guthrie, MD   No LMP recorded. (Menstrual status: IUD).           Sexually active: No.  The current method of family planning is IUD--Mirena 03/04/16, vasectomy.    Menopausal hormone therapy:  n/a Exercising: Yes.     walking Smoker:  former  OB History  Gravida Para Term Preterm AB Living  0       SAB IAB Ectopic Multiple Live Births           HEALTH MAINTENANCE: Last 2 paps:  12-23-20 Neg:Neg HR HPV, 12-03-16 Neg:Neg HR HPV  History of abnormal Pap or positive HPV:  no.  Hx condyloma of vulva in 2020.  Mammogram:   01/04/23 Breast Density Cat B, BI-RADS CAT 1 neg Colonoscopy:  10/22/13 - has upcoming appointment in May.  Bone Density: 08/05/22 ([facility])  Result  normal   Immunization History  Administered Date(s) Administered   Influenza Split 12/06/2011   Influenza Whole 01/02/2013   Influenza,inj,Quad PF,6+ Mos 11/02/2013, 11/23/2016, 12/10/2017, 10/26/2018, 01/05/2021   Influenza-Unspecified 12/04/2014, 03/03/2016, 11/23/2016, 12/10/2017, 12/19/2019   PFIZER(Purple Top)SARS-COV-2 Vaccination 10/14/2019, 11/09/2019   Pneumococcal Conjugate-13 01/22/2013   Tdap 03/05/2002, 08/02/2012, 01/22/2013   Zoster Recombinant(Shingrix) 01/05/2021, 06/17/2021      reports that she quit smoking about 34 years ago. Her smoking use included cigarettes. She started smoking about 40 years ago. She has a 3 pack-year smoking history. She has never used smokeless tobacco. She reports current alcohol use. She reports that she does not use drugs.  Past Medical History:  Diagnosis Date   Anxiety    NO MEDS   Arthritis    Back pain    Cervical cancer  screening 07/20/2012   Menarche at 13, regular til recently No h/o abnormal paps, last pap 2-3 years ago G0P0  MGM today, no h/o abnormal MGMs Spotting now monthly and scant H/o uterine polyps   Condyloma acuminata 2020   Depression    counseling in 2010 for 2 months on medication   Fibrocystic breast 1988   Fibroids 2014   Fracture of right fibula 05/19/2015   October 2016   GERD (gastroesophageal reflux disease) 2005   treated with omeprazole   High cholesterol 2002   History of sinusitis    once/twice per year   Hyperglycemia 05/19/2015   Hypertension 2002   Joint pain    MRSA (methicillin resistant staph aureus) culture positive 2007   Muscular pain    Obesity    OSA (obstructive sleep apnea)    +sleep study 08/2010., DOES NOT USE CPAP, USES MOSES MOUTH PIECE   Osteoarthritis    KNEES, HANDS, HIPS   Osteoporosis    Plantar fasciitis of left foot 03/2014   Seasonal allergies    affected in the spring   Sleep apnea    mouthpiece "[facility]"   Uterine polyp    Uterine polyp 05/19/2015   Recurrent Managed by Dr Wyvonnia Lora   Vitamin D deficiency    Wrist fracture 07/2012   right    Past Surgical History:  Procedure Laterality Date   DILATATION &  CURETTAGE/HYSTEROSCOPY WITH MYOSURE N/A 07/30/2014   Procedure: DILATATION & CURETTAGE/HYSTEROSCOPY WITH MYOSURE;  Surgeon: Patton Salles, MD;  Location: [facility];  Service: Gynecology;  Laterality: N/A;  extra time for BMI 49.   DILATATION & CURRETTAGE/HYSTEROSCOPY WITH RESECTOCOPE N/A 11/24/2012   Procedure: DILATATION & CURETTAGE/HYSTEROSCOPY WITH RESECTOCOPE;  Surgeon: Melony Overly, MD;  Location: [facility];  Service: Gynecology;  Laterality: N/A;   ESSURE TUBAL LIGATION Left    TONSILLECTOMY  1969   WISDOM TOOTH EXTRACTION  1979   WRIST FRACTURE SURGERY Right 07/2012    Current Outpatient Medications  Medication Sig Dispense Refill   busPIRone (BUSPAR) 7.5 MG tablet Take 1 tablet (7.5 mg total) by mouth 2 (two) times daily.  180 tablet 3   CALCIUM PO Take 1 tablet by mouth 2 (two) times daily.      Coenzyme Q10 (CO Q 10 PO) Take by mouth daily.     cyclobenzaprine (FLEXERIL) 10 MG tablet Take 1 tablet (10 mg total) by mouth 2 (two) times daily. TAKE ONE TABLET BY MOUTH ONE TIME DAILY AS NEEDED 180 tablet 1   gabapentin (NEURONTIN) 300 MG capsule TAKE 1 CAPSULE BY MOUTH EVERY DAY AT BEDTIME 30 capsule 3   levonorgestrel (MIRENA) 20 MCG/24HR IUD 1 each by Intrauterine route once.     lisinopril-hydrochlorothiazide (ZESTORETIC) 20-25 MG tablet Take 1 tablet by mouth daily. 90 tablet 3   LORazepam (ATIVAN) 1 MG tablet 1/2 to 1 tab po daily PRN anxiety 15 tablet 0   MAGNESIUM PO Take 500 mg by mouth.     Melatonin 3 MG TABS Take 1 tablet by mouth at bedtime.     meloxicam (MOBIC) 15 MG tablet Take 1 tablet (15 mg total) by mouth daily as needed for pain. 90 tablet 6   montelukast (SINGULAIR) 10 MG tablet Take 1 tablet (10 mg total) by mouth at bedtime. 90 tablet 3   Multiple Vitamin (MULTIVITAMIN) tablet Take 1 tablet by mouth daily.     mupirocin cream (BACTROBAN) 2 % Apply 1 application topically as needed. 15 g 1   Omega-3 Fatty Acids (FISH OIL) 1000 MG CAPS Take 1,000 mg by mouth daily.     omeprazole (PRILOSEC) 20 MG capsule Take 20 mg by mouth daily as needed.     POTASSIUM PO Take 99 mg by mouth.     Probiotic Product (PROBIOTIC PO) Take by mouth.     sodium chloride (OCEAN) 0.65 % SOLN nasal spray Place 1 spray into both nostrils as needed for congestion. 15 mL 0   triamcinolone (NASACORT) 55 MCG/ACT AERO nasal inhaler Place 2 sprays into the nose daily.     Vibegron (GEMTESA) 75 MG TABS Take 75 mg by mouth daily. 30 tablet 9   Vilazodone HCl (VIIBRYD) 40 MG TABS Take 1 tablet (40 mg total) by mouth daily. 90 tablet 3   Vitamin D, Ergocalciferol, (DRISDOL) 1.25 MG (50000 UT) CAPS capsule Take 1 capsule (50,000 Units total) by mouth every 7 (seven) days. 12 capsule 3   WEGOVY 1 MG/0.5ML SOAJ Inject 1 mg into the  skin once a week.     No current facility-administered medications for this visit.    ALLERGIES: Bupropion  Family History  Problem Relation Age of Onset   Osteoarthritis Mother    Hyperlipidemia Mother    Depression Mother    Alzheimer's disease Mother 82   Hypertension Mother    Sleep apnea Mother    Obesity Mother  Hyperlipidemia Father    Coronary artery disease Father    Hypertension Father    Stroke Father        multiple   Diabetes Father    Obesity Father    Sleep apnea Father    Liver cancer Maternal Grandmother        mets to breast   Breast cancer Maternal Grandmother        liver mets to breast   Cancer Maternal Grandmother        with mets   Colon cancer Neg Hx     Review of Systems  All other systems reviewed and are negative.   PHYSICAL EXAM:  BP 136/82 (BP Location: Left Arm, Patient Position: Sitting, Cuff Size: Large)   Pulse 85   Ht 5' 3.5" (1.613 m)   Wt 226 lb (102.5 kg)   SpO2 97%   BMI 39.41 kg/m     General appearance: alert, cooperative and appears stated age Head: normocephalic, without obvious abnormality, atraumatic Neck: no adenopathy, supple, symmetrical, trachea midline and thyroid normal to inspection and palpation Lungs: clear to auscultation bilaterally Breasts: normal appearance, no masses or tenderness, No nipple retraction or dimpling, No nipple discharge or bleeding, No axillary adenopathy Heart: regular rate and rhythm Abdomen: soft, non-tender; no masses, no organomegaly Extremities: extremities normal, atraumatic, no cyanosis or edema Skin: skin color, texture, turgor normal. No rashes or lesions Lymph nodes: cervical, supraclavicular, and axillary nodes normal. Neurologic: grossly normal  Pelvic: External genitalia:  no lesions              No abnormal inguinal nodes palpated.              Urethra:  normal appearing urethra with no masses, tenderness or lesions              Bartholins and Skenes: normal                  Vagina: normal appearing vagina with normal color and discharge, no lesions              Cervix: no lesions.  IUD strings noted.               Pap taken: No. Bimanual Exam:  Uterus:  normal size, contour, position, consistency, mobility, non-tender              Adnexa: no mass, fullness, tenderness              Rectal exam: Yes.  .  Confirms.              Anus:  normal sphincter tone, no lesions  Chaperone was present for exam:  Warren Lacy, CMA  ASSESSMENT: Well woman visit with gynecologic exam. Mirena IUD.  Placed 03/04/16.  Ok until 03/04/24.   IUD is low based on ultrasound findings.  Has unilateral Essure. Overactive bladder.   Doing well on Gemtesa.  Hx vulvar condyloma.  HTN.  Hx anxiety.  Osteoporosis dx due to fragility fractures. Off Forteo. Status post Reclast.  Followed by osteoporosis clinic at [facility].  Taking 50,000 IU weekly.  PHQ-9: 0  PLAN: Mammogram screening discussed. Self breast awareness reviewed. Pap and HRV collected:  No.  Due in 2028. Guidelines for Calcium, Vitamin D, regular exercise program including cardiovascular and weight bearing exercise. Medication refills:  Gemtesa 75 mg daily.  Labs with PCP.  Follow up:  in one year for annual exam and IUD removal.  IUD removal procedure explained.

## 2023-05-10 ENCOUNTER — Encounter: Payer: Self-pay | Admitting: Obstetrics and Gynecology

## 2023-05-10 ENCOUNTER — Ambulatory Visit (INDEPENDENT_AMBULATORY_CARE_PROVIDER_SITE_OTHER): Payer: No Typology Code available for payment source | Admitting: Obstetrics and Gynecology

## 2023-05-10 VITALS — BP 136/82 | HR 85 | Ht 63.5 in | Wt 226.0 lb

## 2023-05-10 DIAGNOSIS — N3281 Overactive bladder: Secondary | ICD-10-CM | POA: Diagnosis not present

## 2023-05-10 DIAGNOSIS — Z30431 Encounter for routine checking of intrauterine contraceptive device: Secondary | ICD-10-CM

## 2023-05-10 DIAGNOSIS — Z1331 Encounter for screening for depression: Secondary | ICD-10-CM | POA: Diagnosis not present

## 2023-05-10 DIAGNOSIS — Z01419 Encounter for gynecological examination (general) (routine) without abnormal findings: Secondary | ICD-10-CM | POA: Diagnosis not present

## 2023-05-10 MED ORDER — GEMTESA 75 MG PO TABS: 75.0000 mg | ORAL_TABLET | Freq: Every day | ORAL | 3 refills | Status: AC

## 2023-05-10 NOTE — Patient Instructions (Signed)

## 2023-07-15 ENCOUNTER — Encounter: Payer: Self-pay | Admitting: Obstetrics and Gynecology

## 2023-07-18 ENCOUNTER — Other Ambulatory Visit: Payer: Self-pay

## 2023-07-18 DIAGNOSIS — N950 Postmenopausal bleeding: Secondary | ICD-10-CM

## 2023-07-18 NOTE — Telephone Encounter (Signed)
 Appt is 08-17-23 for u/s & consult with Dr. Colvin Dec

## 2023-07-18 NOTE — Telephone Encounter (Signed)
 U/s order place. Please call patient to schedule an u/s & consult with Dr. Colvin Dec

## 2023-07-30 ENCOUNTER — Other Ambulatory Visit: Payer: Self-pay | Admitting: Orthopedic Surgery

## 2023-08-17 ENCOUNTER — Other Ambulatory Visit

## 2023-08-17 ENCOUNTER — Other Ambulatory Visit: Admitting: Obstetrics and Gynecology

## 2023-08-23 ENCOUNTER — Other Ambulatory Visit: Admitting: Obstetrics and Gynecology

## 2023-08-23 ENCOUNTER — Other Ambulatory Visit

## 2023-08-29 NOTE — Progress Notes (Signed)
 [facility] - [facility] Return Patient Visit Alejandra Miller DOB: 02-21-1961  AGE: 63 y.o. SEX:  female MRN: [id number] Visit Date: 08/29/2023  Encounter Provider: Thersia Earnie Pizza, MD  Chief Complaint  Patient presents with  . Follow-up    Hypertension - She takes lisinopril -hydrochlorothiazide  20-125.5 mg Anxiety - She is taking Buspar  15 mg BID and Viibryd  40 mg. Right Shoulder  Pain - She continues having the pain. She stopped going to PT due to cost.    Subjective    History of Present Illness The patient is a 63 year old female who presents for follow-up of her anxiety and depression.   Depression and anxiety At her last visit, her BuSpar  was increased to 15 mg twice daily, and she continues to take Viibryd  40 mg. She reports that the increased dosage of BuSpar  has been beneficial in managing her anxiety. She has been attempting to find a suitable virtual psychologist on [employer] but has encountered difficulties due to availability issues in her area. She plans to revisit the site for another attempt. She has been struggling with family discord amongst her siblings. She is working on establishing healthy coping mechanisms. She has been assisting her older sister, who recently broke her leg, which has been a source of stress.  Obesity She has 2 refills left of Wegovy and wishes to maintain her current dosage of 1.7 mg. She missed her Wegovy dose for 3 consecutive weeks due to her husband's umbilical hernia surgery, which led to weight gain. She resumed taking it yesterday and plans to make healthier dietary choices. She feels mentally prepared to resume her medication regimen and weight loss journey.  Follow up Right shoulder pain She has been attending physical therapy for her shoulder and has scheduled weekly visits. However, she had to reschedule the first visit due to a work conflict. During her initial assessment, she was informed that her co-pay would be $174  per visit, which she found unaffordable. She was provided with some exercises and a resistance band, which she has been using independently. She reports that her mobility has improved, but she still experiences discomfort.  She recalls sleeping on her right side for about a month and a half due to pneumonia in her left lower lung, which she thinks may have contributed to her shoulder issues.   Health Maintenance She follows with gynecology for pap smear and IUD management. She is due for mammogram in November of this year.      Problem List[1] Medical History[2] Surgical History[3] Family History[4] Allergies[5]     Vitals:   08/29/23 0858  BP: 126/77  Pulse: 83  Resp: 16  Temp: 97.3 F (36.3 C)  SpO2: 96%  Weight: 102 kg (224 lb)  Height: 1.6 m (5' 2.99)    Physical Exam Vitals reviewed.  Constitutional:      Appearance: Normal appearance.  HENT:     Head: Normocephalic and atraumatic.     Right Ear: External ear normal.     Left Ear: External ear normal.     Mouth/Throat:     Mouth: Mucous membranes are moist.   Eyes:     Extraocular Movements: Extraocular movements intact.    Cardiovascular:     Rate and Rhythm: Normal rate.  Pulmonary:     Effort: Pulmonary effort is normal.   Musculoskeletal:        General: Normal range of motion.     Left shoulder: No deformity or tenderness. Normal range of motion.  Skin:    General: Skin is warm and dry.   Neurological:     General: No focal deficit present.     Mental Status: She is alert.   Psychiatric:        Mood and Affect: Mood normal. Affect is tearful.        Behavior: Behavior normal.      Review of systems  ROS negative unless otherwise stated in HPI above.  Results Imaging  - X-ray of the shoulder: normal     Assessment & Plan 1. Anxiety and depression. - Reports that the increased dose of BuSpar  15 mg twice daily has been helping with her anxiety. Continues to take Viibryd  40 mg  daily. - she is working on healthy coping mechanisms. She is tearful when discussing her family discord.  - Encouraged to continue using [employer] to find a counselor, as this will be beneficial in conjunction with her medication regimen. - Advised to inform us  when her BuSpar  supply is low so that a refill can be provided at the 15 mg dosage.  2. Shoulder pain. - Shoulder mobility appears to be improving. - X-ray results were normal, showing no signs of arthritis in the joint.  - Referral to a sports medicine specialist was discussed but deemed unnecessary at this time due to the improving symptoms. - Advised to continue with stretching exercises  3. Health maintenance. - Due for a Pap smear on 09/14/2023. - Fasting labs will be conducted prior to her next visit in September 2025  - Next physical exam scheduled for January 2026.  Follow-up - Follow up in 3 months.  No orders of the defined types were placed in this encounter.   Orders Placed This Encounter  Procedures  . Comprehensive Metabolic Panel    Standing Status:   Future    Expected Date:   11/29/2023    Expiration Date:   02/27/2024    Release to patient::   Immediate  . Lipid Panel    Standing Status:   Future    Expected Date:   11/29/2023    Expiration Date:   02/27/2024    Release to patient::   Immediate  . CBC with Differential    Standing Status:   Future    Expected Date:   11/29/2023    Expiration Date:   02/27/2024    Release to patient::   Immediate     Discussed the prescription(s) noted above, including potential side effects, risks/benefits, drug interactions, instructions for taking the medication(s), and the consequences of not taking as recommended.  Patient verbalized an understanding of this information and instructions.  They declined any further questions.  The patient understands to report or return to the clinic for any side effects or concerns, as needed.  All questions have been  answered to patient's satisfaction. After visit instructions were provided at the end of the encounter to enforce goals and plan of care.  Return in about 3 months (around 11/29/2023) for Recheck..  Otherwise, the patient will report or return to clinic for any new or worsening symptoms, as needed.  The patient agrees with the above plan, and verbalizes understanding.  Electronically signed: Thersia Earnie Pizza, MD 08/29/2023  7:42 PM      [1] Patient Active Problem List Diagnosis  . Type I or II open fracture of distal end of right fibula  . Open fracture dislocation of ankle joint  . Right leg pain  . Family history of osteoporosis  .  Gastroesophageal reflux disease  . Depression with anxiety  . Anxiety  . Surgical followup  . Senile osteoporosis  . Vitamin D  insufficiency  . Primary hypertension  . Chronic right shoulder pain  [2] Past Medical History: Diagnosis Date  . Arthritis   . HLD (hyperlipidemia)   . Hypertension   . Open fracture dislocation of ankle joint 12/03/2014  . OSA (obstructive sleep apnea)   [3] Past Surgical History: Procedure Laterality Date  . DEBRIDEMENT  FOOT Right 12/02/2014   Procedure: IRRIGATION & DEBRIDEMENT FOOT / ANKLE;  Surgeon: Ozell Debby Bade, MD;  Location: [facility] MAIN OR;  Service: Orthopedics;  Laterality: Right;  AVAIL AFTER 5P  . EXTERNAL FIXATION FOOT FRACTURE Right 12/02/2014   Procedure: EXTERNAL FIXATOR PLACEMENT ANKLE / FOOT;  Surgeon: Ozell Debby Bade, MD;  Location[facility] MAIN OR;  Service: Orthopedics;  Laterality: Right;  . EXTERNAL FIXATOR REMOVAL LOWER EXTREMITY Right 12/09/2014   Procedure: EXTERNAL FIXATOR REMOVAL LOWER EXTREMITY;  Surgeon: Lamar JONETTA Grain, MD;  Location: [facility] OUTPATIENT OR;  Service: Orthopedics;  Laterality: Right;  . ORIF ANKLE FRACTURE Right 12/09/2014   Procedure: OPEN TREATMENT ANKLE FRACTURE;  Surgeon: Lamar JONETTA Grain, MD;  Location: [facility] OUTPATIENT OR;  Service: Orthopedics;  Laterality:  Right;  . TONSILLECTOMY     Procedure: TONSILLECTOMY  . WRIST FRACTURE SURGERY     Procedure: WRIST FRACTURE SURGERY  [4] Family History Problem Relation Name Age of Onset  . Hypertension Mother    . Depression Mother    . Heart disease Father    . Stroke Father    . Diabetes Father    . Hypertension Father    . Cancer Sister    . Cancer Brother    . Heart disease Sister    . Anesthesia problems Neg Hx    . Arthritis Neg Hx    . Asthma Neg Hx    . Cerebral palsy Neg Hx    . Clotting disorder Neg Hx    . Club foot Neg Hx    . Collagen disease Neg Hx    . Deep vein thrombosis Neg Hx    . Gait disorder Neg Hx    . Gout Neg Hx    . Hip dysplasia Neg Hx    . Hip fracture Neg Hx    . Hypermobility Neg Hx    . Osteoporosis Neg Hx    . Pulmonary disease Neg Hx    . Scoliosis Neg Hx    . Rheumatologic disease Neg Hx    . Other Neg Hx    . Spina bifida Neg Hx    . Thyroid  disease Neg Hx    . Vasculitis Neg Hx    [5] Allergies Allergen Reactions  . Bupropion  Other (See Comments)    Makes her feel devoid of emotions

## 2023-09-14 ENCOUNTER — Other Ambulatory Visit: Admitting: Obstetrics and Gynecology

## 2023-09-14 ENCOUNTER — Other Ambulatory Visit

## 2023-10-25 ENCOUNTER — Other Ambulatory Visit: Admitting: Obstetrics and Gynecology

## 2023-10-25 ENCOUNTER — Other Ambulatory Visit

## 2023-12-01 ENCOUNTER — Other Ambulatory Visit: Payer: Self-pay | Admitting: Obstetrics and Gynecology

## 2023-12-01 DIAGNOSIS — Z0000 Encounter for general adult medical examination without abnormal findings: Secondary | ICD-10-CM

## 2023-12-07 ENCOUNTER — Other Ambulatory Visit: Payer: Self-pay | Admitting: Orthopedic Surgery

## 2023-12-26 ENCOUNTER — Encounter: Payer: Self-pay | Admitting: Obstetrics and Gynecology

## 2023-12-26 DIAGNOSIS — Z30432 Encounter for removal of intrauterine contraceptive device: Secondary | ICD-10-CM

## 2023-12-26 DIAGNOSIS — N950 Postmenopausal bleeding: Secondary | ICD-10-CM

## 2023-12-28 NOTE — Telephone Encounter (Signed)
 Spoke with patient. PUS and PAP, with possible EMB and IUD removal scheduled for 01/12/24 at 1100, consult to follow with Dr. Nikki.   Routing to provider for final review. Patient is agreeable to disposition. Will close encounter.

## 2023-12-28 NOTE — Telephone Encounter (Signed)
 Note make in appointment for no gel.

## 2024-01-02 ENCOUNTER — Encounter: Payer: Self-pay | Admitting: Radiology

## 2024-01-06 ENCOUNTER — Ambulatory Visit
Admission: RE | Admit: 2024-01-06 | Discharge: 2024-01-06 | Disposition: A | Discharge status: Home / Self Care | Source: Ambulatory Visit | Attending: Obstetrics and Gynecology | Admitting: Obstetrics and Gynecology

## 2024-01-06 DIAGNOSIS — Z0000 Encounter for general adult medical examination without abnormal findings: Secondary | ICD-10-CM

## 2024-01-10 ENCOUNTER — Ambulatory Visit: Payer: Self-pay | Admitting: Obstetrics and Gynecology

## 2024-01-12 ENCOUNTER — Ambulatory Visit

## 2024-01-12 ENCOUNTER — Other Ambulatory Visit (HOSPITAL_COMMUNITY)
Admission: RE | Admit: 2024-01-12 | Discharge: 2024-01-12 | Disposition: A | Source: Ambulatory Visit | Attending: Obstetrics and Gynecology | Admitting: Obstetrics and Gynecology

## 2024-01-12 ENCOUNTER — Ambulatory Visit (INDEPENDENT_AMBULATORY_CARE_PROVIDER_SITE_OTHER): Admitting: Obstetrics and Gynecology

## 2024-01-12 ENCOUNTER — Encounter: Payer: Self-pay | Admitting: Obstetrics and Gynecology

## 2024-01-12 ENCOUNTER — Other Ambulatory Visit (HOSPITAL_COMMUNITY)
Admission: RE | Admit: 2024-01-12 | Discharge: 2024-01-12 | Disposition: A | Discharge status: Home / Self Care | Source: Ambulatory Visit | Attending: Obstetrics and Gynecology | Admitting: Obstetrics and Gynecology

## 2024-01-12 VITALS — BP 124/84 | HR 70 | Ht 64.25 in | Wt 215.0 lb

## 2024-01-12 DIAGNOSIS — N950 Postmenopausal bleeding: Secondary | ICD-10-CM | POA: Insufficient documentation

## 2024-01-12 DIAGNOSIS — Z975 Presence of (intrauterine) contraceptive device: Secondary | ICD-10-CM | POA: Insufficient documentation

## 2024-01-12 DIAGNOSIS — D219 Benign neoplasm of connective and other soft tissue, unspecified: Secondary | ICD-10-CM | POA: Diagnosis not present

## 2024-01-12 DIAGNOSIS — Z124 Encounter for screening for malignant neoplasm of cervix: Secondary | ICD-10-CM | POA: Diagnosis not present

## 2024-01-12 DIAGNOSIS — N83209 Unspecified ovarian cyst, unspecified side: Secondary | ICD-10-CM

## 2024-01-12 NOTE — Progress Notes (Signed)
 GYNECOLOGY  VISIT   HPI: 63 y.o.   Married  Caucasian female   G0P0 with No LMP recorded. (Menstrual status: IUD).   here for: U/S Consult, pap, possible endometrial biopsy and IUD removal.       Patient contacted office in May with postmenopausal bleeding.  Recurrence of bleeding in October.    Patient had Mirena  IUD placed 03/04/16 for perimenopausal bleeding and prevention of formation of recurrent endometrial polyps.  Her dog died 3 weeks ago.  GYNECOLOGIC HISTORY: No LMP recorded. (Menstrual status: IUD). Contraception:  vasectomy/Mirena  IUD inserted 03-04-16.  Unilateral Essure.  Menopausal hormone therapy:  n/a Last 2 paps:  12/23/20 neg HR HPV neg, 12/03/16 neg HPV neg History of abnormal Pap or positive HPV:  no Mammogram:  01/06/24 Breast Density Cat B, BIRADS Cat 1 neg         OB History     Gravida  0   Para      Term      Preterm      AB      Living         SAB      IAB      Ectopic      Multiple      Live Births                 Patient Active Problem List   Diagnosis Date Noted   Non-seasonal allergic rhinitis 03/04/2022   Recurrent major depressive disorder, in full remission 08/07/2021   GAD (generalized anxiety disorder) 01/05/2021   Abnormal uterine bleeding 01/05/2021   Osteoarthritis    Chronic neck pain 11/23/2016   Senile osteoporosis 04/02/2015   Gastroesophageal reflux disease 03/25/2015   BMI 40.0-44.9, adult (HCC) 02/12/2012   Vitamin D  insufficiency 12/15/2010   h/o OSA (obstructive sleep apnea) 08/11/2010   Mixed hyperlipidemia 07/28/2010   Essential hypertension 07/28/2010    Past Medical History:  Diagnosis Date   Anxiety    NO MEDS   Arthritis    Back pain    Bursitis    Cervical cancer screening 07/20/2012   Menarche at 13, regular til recently No h/o abnormal paps, last pap 2-3 years ago G0P0  MGM today, no h/o abnormal MGMs Spotting now monthly and scant H/o uterine polyps   Condyloma acuminata 2020    Depression    counseling in 2010 for 2 months on medication   Fibrocystic breast 1988   Fibroids 2014   Fracture of right fibula 05/19/2015   October 2016   GERD (gastroesophageal reflux disease) 2005   treated with omeprazole    High cholesterol 2002   History of sinusitis    once/twice per year   Hyperglycemia 05/19/2015   Hypertension 2002   Joint pain    MRSA (methicillin resistant staph aureus) culture positive 2007   Muscular pain    Obesity    OSA (obstructive sleep apnea)    +sleep study 08/2010., DOES NOT USE CPAP, USES MOSES MOUTH PIECE   Osteoarthritis    KNEES, HANDS, HIPS   Osteoporosis    Plantar fasciitis of left foot 03/2014   Seasonal allergies    affected in the spring   Sleep apnea    mouthpiece Moses   Uterine polyp    Uterine polyp 05/19/2015   Recurrent Managed by Dr Lyle Cary   Vitamin D  deficiency    Wrist fracture 07/2012   right    Past Surgical History:  Procedure Laterality Date  DILATATION & CURETTAGE/HYSTEROSCOPY WITH MYOSURE N/A 07/30/2014   Procedure: DILATATION & CURETTAGE/HYSTEROSCOPY WITH MYOSURE;  Surgeon: Bobie FORBES Cathlyn JAYSON Nikki, MD;  Location: [facility] ORS;  Service: Gynecology;  Laterality: N/A;  extra time for BMI 49.   DILATATION & CURRETTAGE/HYSTEROSCOPY WITH RESECTOCOPE N/A 11/24/2012   Procedure: DILATATION & CURETTAGE/HYSTEROSCOPY WITH RESECTOCOPE;  Surgeon: Bobie DELENA Nikki, MD;  Location: [facility] ORS;  Service: Gynecology;  Laterality: N/A;   ESSURE TUBAL LIGATION Left    TONSILLECTOMY  1969   WISDOM TOOTH EXTRACTION  1979   WRIST FRACTURE SURGERY Right 07/2012    Current Outpatient Medications  Medication Sig Dispense Refill   busPIRone  (BUSPAR ) 7.5 MG tablet Take 1 tablet (7.5 mg total) by mouth 2 (two) times daily. 180 tablet 3   CALCIUM  PO Take 1 tablet by mouth 2 (two) times daily.      Coenzyme Q10 (CO Q 10 PO) Take by mouth daily.     cyclobenzaprine  (FLEXERIL ) 10 MG tablet Take 1 tablet (10 mg total) by mouth 2 (two) times  daily. TAKE ONE TABLET BY MOUTH ONE TIME DAILY AS NEEDED 180 tablet 1   gabapentin  (NEURONTIN ) 300 MG capsule TAKE 1 CAPSULE BY MOUTH EVERY DAY AT BEDTIME 30 capsule 3   levonorgestrel  (MIRENA ) 20 MCG/24HR IUD 1 each by Intrauterine route once.     lisinopril -hydrochlorothiazide  (ZESTORETIC ) 20-25 MG tablet Take 1 tablet by mouth daily. 90 tablet 3   LORazepam  (ATIVAN ) 1 MG tablet 1/2 to 1 tab po daily PRN anxiety 15 tablet 0   MAGNESIUM  PO Take 500 mg by mouth.     Melatonin 3 MG TABS Take 1 tablet by mouth at bedtime.     meloxicam  (MOBIC ) 15 MG tablet Take 1 tablet (15 mg total) by mouth daily as needed for pain. 90 tablet 6   montelukast  (SINGULAIR ) 10 MG tablet Take 1 tablet (10 mg total) by mouth at bedtime. 90 tablet 3   Multiple Vitamin (MULTIVITAMIN) tablet Take 1 tablet by mouth daily.     mupirocin  cream (BACTROBAN ) 2 % Apply 1 application topically as needed. 15 g 1   Omega-3 Fatty Acids (FISH OIL) 1000 MG CAPS Take 1,000 mg by mouth daily.     omeprazole  (PRILOSEC ) 20 MG capsule Take 20 mg by mouth daily as needed.     POTASSIUM PO Take 99 mg by mouth.     Probiotic Product (PROBIOTIC PO) Take by mouth.     sodium chloride  (OCEAN) 0.65 % SOLN nasal spray Place 1 spray into both nostrils as needed for congestion. 15 mL 0   triamcinolone (NASACORT) 55 MCG/ACT AERO nasal inhaler Place 2 sprays into the nose daily.     Vibegron  (GEMTESA ) 75 MG TABS Take 1 tablet (75 mg total) by mouth daily. 90 tablet 3   Vilazodone  HCl (VIIBRYD ) 40 MG TABS Take 1 tablet (40 mg total) by mouth daily. 90 tablet 3   Vitamin D , Ergocalciferol , (DRISDOL ) 1.25 MG (50000 UT) CAPS capsule Take 1 capsule (50,000 Units total) by mouth every 7 (seven) days. 12 capsule 3   WEGOVY 1.7 MG/0.75ML SOAJ SQ injection INJECT CONTENTS OF 3/4 (THREE-FOURTHS ) ML (1.7MG ) ONCE A WEEK     No current facility-administered medications for this visit.     ALLERGIES: Bupropion   Family History  Problem Relation Age of Onset    Osteoarthritis Mother    Hyperlipidemia Mother    Depression Mother    Alzheimer's disease Mother 100   Hypertension Mother    Sleep  apnea Mother    Obesity Mother    Hyperlipidemia Father    Coronary artery disease Father    Hypertension Father    Stroke Father        multiple   Diabetes Father    Obesity Father    Sleep apnea Father    Liver cancer Maternal Grandmother        mets to breast   Breast cancer Maternal Grandmother        liver mets to breast   Cancer Maternal Grandmother        with mets   Colon cancer Neg Hx     Social History   Socioeconomic History   Marital status: Married    Spouse name: Lorelie Biermann   Number of children: Not on file   Years of education: Not on file   Highest education level: Not on file  Occupational History   Not on file  Tobacco Use   Smoking status: Former    Current packs/day: 0.00    Average packs/day: 0.5 packs/day for 6.0 years (3.0 ttl pk-yrs)    Types: Cigarettes    Start date: 03/02/1983    Quit date: 03/01/1989    Years since quitting: 34.8   Smokeless tobacco: Never   Tobacco comments:    Quit in 1991- started in 1983 1/2ppd  Vaping Use   Vaping status: Never Used  Substance and Sexual Activity   Alcohol use: Yes    Alcohol/week: 0.0 standard drinks of alcohol    Comment: 2 drinks/month   Drug use: No   Sexual activity: Not Currently    Partners: Male    Birth control/protection: Other-see comments, I.U.D.    Comment: vasectomy/Mirena  IUD inserted 03-04-16  Other Topics Concern   Not on file  Social History Narrative   Not on file   Social Drivers of Health   Financial Resource Strain: Low Risk  (07/05/2022)   Received from [facility]   Overall Financial Resource Strain (CARDIA)    Difficulty of Paying Living Expenses: Not very hard  Food Insecurity: Low Risk  (03/23/2023)   Received from [facility]   Hunger Vital Sign    Within the past 12 months, you worried that your food would run out before you  got money to buy more: Never true    Within the past 12 months, the food you bought just didn't last and you didn't have money to get more. : Never true  Transportation Needs: No Transportation Needs (03/23/2023)   Received from [facility]    In the past 12 months, has lack of reliable transportation kept you from medical appointments, meetings, work or from getting things needed for daily living? : No  Physical Activity: Unknown (07/05/2022)   Received from [facility]   Exercise Vital Sign    On average, how many days per week do you engage in moderate to strenuous exercise (like a brisk walk)?: 0 days    Minutes of Exercise per Session: Not on file  Stress: Stress Concern Present (07/05/2022)   Received from [facility] - Occupational Stress Questionnaire    Feeling of Stress : To some extent  Social Connections: Somewhat Isolated (07/05/2022)   Received from [facility]   Social Network    How would you rate your social network (family, work, friends)?: Restricted participation with some degree of social isolation  Intimate Partner Violence: Not At Risk (01/29/2023)  Received from [facility]   HITS    Over the last 12 months how often did your partner physically hurt you?: Never    Over the last 12 months how often did your partner insult you or talk down to you?: Never    Over the last 12 months how often did your partner threaten you with physical harm?: Never    Over the last 12 months how often did your partner scream or curse at you?: Never    Review of Systems  All other systems reviewed and are negative.   PHYSICAL EXAMINATION:   BP 124/84 (BP Location: Left Arm, Patient Position: Sitting)   Pulse 70   Ht 5' 4.25 (1.632 m)   Wt 215 lb (97.5 kg)   SpO2 96%   BMI 36.62 kg/m     General appearance: alert, cooperative and appears stated age   Pelvic: External genitalia:  no lesions               Urethra:  normal appearing urethra with no masses, tenderness or lesions              Bartholins and Skenes: normal                 Vagina: normal appearing vagina with normal color and discharge, no lesions              Cervix: no lesions.   Pap and HR HPV collected.                 Bimanual Exam:  Uterus:  normal size, contour, position, consistency, mobility, non-tender              Adnexa: no mass, fullness, tenderness             Pelvic US : Uterus:  6.83 x 5.68 x 3.35 cm.  Fibroids:  intramural and subserosal, largest fibroid is abutting the endometrium:  2.85 cm, 1.53 cm, 1.04 cm, 1.17 cm, 1.89 cm.  EMS:  2.7 mm, 2 areas of echogenic endometrium 5.3 x 4 mm and 7.3 x 5.2 mm.  Possible polyps.   IUD located centrally in the endometrial canal.  Fluid in the endometrial canal.  Left ovary 1.38 x 0.89 x 1.02 cm.  Atrophic.  Right ovary 3.32 x 2.37 x 2.56 cm.  Follicle 11.9 mm.  Simple cyst 2.01 cm.  No adnexal masses.  No free fluid.   EMB: Consent for procedure. Time out done. Sterile prep with Hibiclens Paracervical block with 10 cc 1% lidocaine , lot [id number], exp Feb. 2028  Tenaculum to anterior cervical lip. Pipelle passed to          cm twice.   Tissue to pathology.  Minimal EBL. No complications.     Chaperone was present for exam:  Kari HERO, CMA  ASSESSMENT:  Postmenopausal bleeding.  Mirena  IUD, close to expiration date.  Hx recurrent endometrial polyp formation in past.  Uterine fibroids.  Simple right ovarian cyst.  Cervical cancer screening.   PLAN:  US  images and report reviewed.  Fibroids and right ovarian cyst discussed.  Pap and HR HPV collected.  It is possible the bleeding has resumed due to eminent Mirena  IUD expiration. Follow up endometrial biopsy result. Final plan to follow.   25 min  total time was spent for this patient encounter, including preparation, face-to-face counseling with the patient, coordination of care, and documentation of the  encounter in addition to doing the endometrial biopsy.

## 2024-01-12 NOTE — Patient Instructions (Signed)
 Endometrial Biopsy  An endometrial biopsy is a procedure where a tissue sample is removed from the lining of the uterus. This lining is called the endometrium. The tissue sample is then sent to a lab for testing. You may have this type of biopsy to check for: Cancer. Infection. Growths called polyps. Uterine bleeding that can't be explained. Tell a health care provider about: Any allergies you have. All medicines you're taking including vitamins, herbs, eye drops, creams, and over-the-counter medicines. Any problems you or family members have had with anesthesia. Any bleeding problems you have. Any surgeries you have had. Any medical problems you have. Whether you're pregnant or may be pregnant. What are the risks? Your health care provider will talk with you about risks. These may include: Bleeding. Infection. Allergic reactions to medicines. Damage to the wall of the uterus. This is rare. What happens before the procedure? Keep track of your period. You may need to have this biopsy when you're not having your period. Ask your provider about: Changing or stopping your regular medicines. These include any diabetes medicines or blood thinners you take. Taking medicines such as aspirin and ibuprofen. These medicines can thin your blood. Do not take them unless your provider tells you to. Taking over-the-counter medicines, vitamins, herbs, and supplements. Bring a pad with you. You may need to wear one after the biopsy. Plan to have a responsible adult take you home from the hospital or clinic. You won't be allowed to drive. What happens during the procedure? A tool will be put into your vagina to hold it open. This helps your provider see the cervix. The cervix is the lowest part of the uterus. Your cervix will be cleaned with a solution that kills germs. You will be given anesthesia. This keeps you from feeling pain. It will numb your cervix. A tool called forceps will be used to  hold your cervix steady. A thin tool called a uterine sound will be put through your cervix. It will be used to: Find the length of your uterus. Find where to take the sample from. A soft tube called a catheter will be put into your uterus. The catheter will remove a tissue sample. The tube and tools will be removed. The sample will be sent to a lab for testing. The procedure may vary among providers and hospitals. What happens after the procedure? Your blood pressure, heart rate, breathing rate, and blood oxygen level will be monitored until you leave the hospital or clinic. It's up to you to get the results of your procedure. Ask your provider, or the department that is doing the procedure, when your results will be ready. This information is not intended to replace advice given to you by your health care provider. Make sure you discuss any questions you have with your health care provider. Document Revised: 04/27/2022 Document Reviewed: 04/27/2022 Elsevier Patient Education  2024 [employer].

## 2024-01-17 ENCOUNTER — Ambulatory Visit: Payer: Self-pay | Admitting: Obstetrics and Gynecology

## 2024-01-17 LAB — SURGICAL PATHOLOGY

## 2024-01-18 LAB — CYTOLOGY - PAP
Comment: NEGATIVE
Diagnosis: NEGATIVE
Diagnosis: REACTIVE
High risk HPV: NEGATIVE

## 2024-01-25 ENCOUNTER — Encounter: Payer: Self-pay | Admitting: Obstetrics and Gynecology

## 2024-01-25 ENCOUNTER — Ambulatory Visit (INDEPENDENT_AMBULATORY_CARE_PROVIDER_SITE_OTHER): Admitting: Obstetrics and Gynecology

## 2024-01-25 VITALS — BP 124/80 | HR 81

## 2024-01-25 DIAGNOSIS — N840 Polyp of corpus uteri: Secondary | ICD-10-CM

## 2024-01-25 DIAGNOSIS — N950 Postmenopausal bleeding: Secondary | ICD-10-CM

## 2024-01-25 NOTE — Patient Instructions (Signed)
 Hysterectomy Information  A hysterectomy is a surgery to take out the uterus. The uterus is where a baby grows when a person is pregnant. Other organs may also be taken out. They are: The organs that make eggs (ovaries). The tubes that move the egg to the uterus (fallopian tubes). The lowest part of the uterus (cervix). After the surgery, you'll no longer have your period. You'll not be able to get pregnant. This surgery can affect the way you feel. Talk with your health care provider about the physical and emotional effects of this surgery. Why is a hysterectomy done? This surgery may be done if: You have growths in the uterus, such as fibroids. This is the most common reason. The lining of the uterus grows outside the uterus. Your uterus has moved down and bulges down into the vagina. You have abnormal or heavy bleeding during your period. You have pain in the pelvic area, and the pain lasts a long time. You have cancer of the uterus or cervix. What are the risks of hysterectomy? Your provider will talk with you about risks. These may include: Bleeding or blood clots in the legs or lung. Infection. Injury to areas close to the uterus. These include the nerves, bladder, or bowel. Reaction to the medicines used. Early symptoms of menopause, if both ovaries are taken out. These include hot flashes, vaginal dryness, night sweats, and lack of sleep. What can be taken out during a hysterectomy? This surgery can be done: To take out the top part of the uterus only. The cervix is not taken out. To take out the uterus and cervix. To take out the uterus, the cervix, and the tissue that holds the uterus. In some cases, your ovaries may also be taken out. The parts that will be taken out are based on the reason why you need this surgery. What are types of this surgery? The uterus can be taken out in many ways. Talk with your provider about the best surgery for you.  Here are the ones that are  done most often. There are benefits and risks for each. Abdominal hysterectomy One big cut is made in your belly. The uterus and other organs are taken out through this cut. This method is used: When your provider wants a better way to get to your uterus. If you have scars inside your belly that stick to other parts or organs. These are called adhesions. If you have this surgery: You may take longer to get well because of the big cut in your belly. There's a higher risk of injury to other tissues and organs. You may get adhesions. Vaginal hysterectomy Cuts are made in the top of the vagina. No cuts are made on your skin. The uterus is taken out through the vagina. If you have this surgery: You may have a shorter stay in the hospital. There's a lower risk of getting adhesions. You may have fewer problems after surgery. Laparoscopic-assisted vaginal hysterectomy (LAVH) Many small cuts are made in your belly and inside your vagina. LAVH is done with long, thin tools and cameras. Robots are also used. LAVH takes longer. There's also higher risk of injury to other organs. But there's less bleeding. If you have LAVH: There's less risk that germs will get into your body. You'll have a short stay in the hospital. You'll go back to your normal activities sooner. What happens after the surgery? You will be given pain medicine. You may need to stay in the hospital  for 1-2 days. You may need to have an adult stay with you for a few days after you go home. You will not be able to lift heavy objects. You will not be able to put anything in your vagina for the first 6 weeks or for the time told by your provider. This includes douching, having sex, and using tampons. If your ovaries were taken out, you may get hot flashes. You may also have night sweats, vaginal dryness, and trouble sleeping. Questions to ask your health care provider Is this surgery needed? What other options do I have? What organs  need to be taken out? How long will I need to stay in the hospital? How long will I need to get better at home? What symptoms can I expect after the surgery? This information is not intended to replace advice given to you by your health care provider. Make sure you discuss any questions you have with your health care provider. Document Revised: 11/18/2022 Document Reviewed: 06/04/2022 Elsevier Patient Education  2024 [employer]  Removing Tissue From the Uterus (Dilation and Curettage): What to Expect Dilation and curettage, also called a D&C, is a procedure where tissue is removed from the uterus. It involves opening the cervix (dilation) and then gently scraping the inside lining of the uterus. The tissue is then removed through the cervix and vagina. In some cases, the lining and tissue in the uterus are removed using gentle suction. This is called vacuum curettage. Curettage may be performed to either diagnose or treat a problem. It may be done if you have: Spotting between periods, long periods, or other abnormal bleeding. Bleeding after menopause. No period. A change in the size and shape of the uterus. This procedure may also be done: To remove an intrauterine device (IUD). To remove the remaining placenta after giving birth. To remove growths or tumors in the lining of the uterus. During an abortion or after a miscarriage. Tell a health care provider about: Any allergies you have. All medicines you take. These include vitamins, herbs, eye drops, and creams. Any surgeries you've had. Any medical problems you have. Any bleeding problems you have. Any problems you or family members have had with anesthesia. Whether you're pregnant or may be pregnant. Any recent vaginal infections, periods, or vaginal bleeding. What form of birth control you use. What are the risks? Your health care provider will talk with you about risks. These may include: Infection. Heavy vaginal  bleeding. Allergic reactions to medicines. Damage to the cervix or nearby structures or organs. Scar tissue forming inside the uterus. What happens before? When to stop eating and drinking Eat and drink only as you've been told. You may be told this: 8 hours before your procedure Stop eating most foods. Do not eat meat, fried foods, or fatty foods. Eat only light foods, such as toast or crackers. All liquids are OK except energy drinks and alcohol. 6 hours before your procedure Stop eating. Drink only clear liquids, such as water, clear fruit juice, black coffee, plain tea, and sports drinks. Do not drink energy drinks or alcohol. 2 hours before your procedure Stop drinking all liquids. You may be allowed to take medicines with small sips of water. If you don't eat and drink as told, your procedure may be delayed or canceled. Medicines Ask about changing or stopping: Any medicines you take. Any vitamins, herbs, or supplements you take. Do not take aspirin or ibuprofen  unless you're told to. You may be given medicine  to open the cervix. You may be given antibiotics. Tests You may have testing, such as: A pregnancy test. Blood tests. Pee tests. Ultrasound. General instructions For the time you're told before your procedure: Do not douche, use tampons, or have sex. Do not use medicines, creams, or suppositories in the vagina. Do not smoke, vape, or use nicotine or tobacco for at least 4 weeks before the procedure. Ask if you'll be staying overnight in the hospital. If you'll be going home right after the procedure, plan to have a responsible adult: Drive you home from the hospital or clinic. You won't be allowed to drive. Stay with you for the time you're told. What happens during the dilation and curettage?  An IV will be put into a vein in your hand or arm. You may be given: A sedative to help you relax. Anesthesia to keep you from feeling pain. You'll lie down on your  back, with your knees bent and your feet in footrests. A special tool covered with gel, called a speculum, will be inserted into your vagina. This will let your provider see your cervix. Thin rods will be put into your cervix to open it. A small, curved tool will be put through your cervix. Your provider will use this to remove tissue or cells. In some cases, gentle suction will also be used. The cells will be sent to a lab for testing. These steps may vary. Ask what you can expect. What happens after? You'll be watched closely until you leave. This includes checking your pain level, blood pressure, heart rate, and breathing rate. You may have mild cramping, a backache, pain, and light bleeding or spotting. You may pass small blood clots from your vagina. You may have to wear compression stockings. These stockings help to prevent blood clots and reduce swelling in your legs. This information is not intended to replace advice given to you by your health care provider. Make sure you discuss any questions you have with your health care provider. Document Revised: 12/03/2022 Document Reviewed: 12/03/2022 Elsevier Patient Education  2025 [employer].  Hysteroscopy Hysteroscopy is a procedure used to look inside a person's uterus. It should be done right after your period. You may have this procedure if your health care provider wants to: Look for tumors and other growths in the uterus. Know more about your bleeding, fibroids, tumors, polyps, scar tissue, or cancer in the uterus. Know why you aren't able to get pregnant, or why you lose your pregnancies. Find an intrauterine device (IUD) in your body. Put a birth control device in the fallopian tubes. Tell a health care provider about: Any allergies you have. All medicines you are taking. These include vitamins, herbs, eye drops, creams, and over-the-counter medicines. Any problems you or family members have had with anesthesia. Any bleeding  problems you have. Any medical conditions or surgeries you've had. Whether you're pregnant or may be pregnant. Whether you have or have had a sexually transmitted infection (STI), or you think you have an STI. What are the risks? Your provider will talk with you about risks. These may include: Bleeding that's worse. Infection. Damage to parts of the uterus or other organs. Allergies to medicines or fluids that are used in the procedure. What happens before the procedure? When to stop eating and drinking Follow instructions from your provider about what you may eat and drink. These may include: 8 hours before your procedure Stop eating most foods. Do not eat meat, fried foods, or  fatty foods. Eat only light foods, such as toast or crackers. All liquids are okay except energy drinks and alcohol. 6 hours before your procedure Stop eating. Drink only clear liquids, such as water, clear fruit juice, black coffee, plain tea, and sports drinks. Do not drink energy drinks or alcohol. 2 hours before your procedure Stop drinking all liquids. You may be allowed to take medicines with small sips of water. If you do not follow your provider's instructions, your procedure may be delayed or canceled. Medicines Ask your provider about: Changing or stopping your regular medicines. These include any diabetes medicines or blood thinners you take. Taking medicines such as aspirin and ibuprofen . These medicines can thin your blood. Do not take them unless your provider tells you to. Taking over-the-counter medicines, vitamins, herbs, and supplements. Medicine may be placed in your cervix the day before the procedure. This medicine causes the cervix to open (dilate). The larger opening makes it easier for the hysteroscope to be inserted into the uterus. General instructions Ask your provider what steps will be taken to help prevent infection. These steps may include: Washing skin with a soap that kills  germs. Taking antibiotic medicine. Do not smoke, vape, or use any products that have nicotine or tobacco for at least 4 weeks before the surgery. If you need help quitting, ask your provider. If you will be going home right after the procedure, plan to have an adult: Take you home from the hospital or clinic. You will not be allowed to drive. Care for you for the time you are told. Pee before the procedure begins. What happens during the procedure? You may be given: A sedative. This helps you relax. Anesthesia. This keeps you from feeling pain. It will make you fall asleep for surgery. A small tube with a light and camera called a hysteroscope will be put into your uterus. The camera sends images to a screen in the room so your provider can see the inside of your uterus. Air or fluid will be used to enlarge your uterus to allow your provider to see it better. In some cases, tissue may be gently scraped from inside the uterus and sent to a lab for testing (biopsy). The procedure may vary among providers and hospitals. What happens after the procedure? Your blood pressure, heart rate, breathing rate, and blood oxygen level will be monitored until you leave the hospital or clinic. You may have cramps. You may be given medicines for this. You may have bleeding. Bleeding may be light or heavy. This is normal. If you had a biopsy, it is up to you to get the results. Ask your provider, or the department that is doing the procedure, when your results will be ready. This information is not intended to replace advice given to you by your health care provider. Make sure you discuss any questions you have with your health care provider. Document Revised: 06/18/2022 Document Reviewed: 06/18/2022 Elsevier Patient Education  2024 [employer].

## 2024-01-25 NOTE — Progress Notes (Signed)
 GYNECOLOGY  VISIT   HPI: 63 y.o.   Married  Caucasian female   G0P0 with No LMP recorded. (Menstrual status: IUD).   here for: Discuss treatment options  Has Mirena  IUD, postmenopausal bleeding, ultrasound indicating possible polyps, and endometrial biopsy showing pseudocecidualized stroma and fragments suggestive of endometrial polyp.  No hyperplasia or cancer seen.  Her IUD is expiring in January, 2026.    Has a hx of endometrial polyp formation.   Status post hysteroscopy with dilation and curettage x 2, Sept 2014 and May 2016.  Mirena  IUD was placed to control abnormal perimenopausal bleeding.    Stress at home.  Dog died.    GYNECOLOGIC HISTORY: No LMP recorded. (Menstrual status: IUD). Contraception:  vasectomy, Mirena  03/04/16 Menopausal hormone therapy:  n/a Last 2 paps:  01/12/24 neg, HR HPV neg, 12/23/20 neg, HR HPV neg  History of abnormal Pap or positive HPV:  no Mammogram:  01/06/24 Breast Density Cat B, BIRADS Cat 1 neg         OB History     Gravida  0   Para      Term      Preterm      AB      Living         SAB      IAB      Ectopic      Multiple      Live Births                 Patient Active Problem List   Diagnosis Date Noted   Non-seasonal allergic rhinitis 03/04/2022   Recurrent major depressive disorder, in full remission 08/07/2021   GAD (generalized anxiety disorder) 01/05/2021   Abnormal uterine bleeding 01/05/2021   Osteoarthritis    Chronic neck pain 11/23/2016   Senile osteoporosis 04/02/2015   Gastroesophageal reflux disease 03/25/2015   BMI 40.0-44.9, adult (HCC) 02/12/2012   Vitamin D  insufficiency 12/15/2010   h/o OSA (obstructive sleep apnea) 08/11/2010   Mixed hyperlipidemia 07/28/2010   Essential hypertension 07/28/2010    Past Medical History:  Diagnosis Date   Anxiety    NO MEDS   Arthritis    Back pain    Bursitis    Cervical cancer screening 07/20/2012   Menarche at 13, regular til recently No  h/o abnormal paps, last pap 2-3 years ago G0P0  MGM today, no h/o abnormal MGMs Spotting now monthly and scant H/o uterine polyps   Condyloma acuminata 2020   Depression    counseling in 2010 for 2 months on medication   Fibrocystic breast 1988   Fibroids 2014   Fracture of right fibula 05/19/2015   October 2016   GERD (gastroesophageal reflux disease) 2005   treated with omeprazole    High cholesterol 2002   History of sinusitis    once/twice per year   Hyperglycemia 05/19/2015   Hypertension 2002   Joint pain    MRSA (methicillin resistant staph aureus) culture positive 2007   Muscular pain    Obesity    OSA (obstructive sleep apnea)    +sleep study 08/2010., DOES NOT USE CPAP, USES MOSES MOUTH PIECE   Osteoarthritis    KNEES, HANDS, HIPS   Osteoporosis    Plantar fasciitis of left foot 03/2014   Seasonal allergies    affected in the spring   Sleep apnea    mouthpiece Moses   Uterine polyp    Uterine polyp 05/19/2015   Recurrent Managed by Dr Lyle  Silva   Vitamin D  deficiency    Wrist fracture 07/2012   right    Past Surgical History:  Procedure Laterality Date   DILATATION & CURETTAGE/HYSTEROSCOPY WITH MYOSURE N/A 07/30/2014   Procedure: DILATATION & CURETTAGE/HYSTEROSCOPY WITH MYOSURE;  Surgeon: Bobie FORBES Cathlyn JAYSON Nikki, MD;  Location: [facility];  Service: Gynecology;  Laterality: N/A;  extra time for BMI 49.   DILATATION & CURRETTAGE/HYSTEROSCOPY WITH RESECTOCOPE N/A 11/24/2012   Procedure: DILATATION & CURETTAGE/HYSTEROSCOPY WITH RESECTOCOPE;  Surgeon: Bobie DELENA Nikki, MD;  Location: [facility];  Service: Gynecology;  Laterality: N/A;   ESSURE TUBAL LIGATION Left    TONSILLECTOMY  1969   WISDOM TOOTH EXTRACTION  1979   WRIST FRACTURE SURGERY Right 07/2012    Current Outpatient Medications  Medication Sig Dispense Refill   busPIRone  (BUSPAR ) 7.5 MG tablet Take 1 tablet (7.5 mg total) by mouth 2 (two) times daily. 180 tablet 3   CALCIUM  PO Take 1 tablet by mouth 2 (two)  times daily.      Coenzyme Q10 (CO Q 10 PO) Take by mouth daily.     cyclobenzaprine  (FLEXERIL ) 10 MG tablet Take 1 tablet (10 mg total) by mouth 2 (two) times daily. TAKE ONE TABLET BY MOUTH ONE TIME DAILY AS NEEDED 180 tablet 1   gabapentin  (NEURONTIN ) 300 MG capsule TAKE 1 CAPSULE BY MOUTH EVERY DAY AT BEDTIME 30 capsule 3   levonorgestrel  (MIRENA ) 20 MCG/24HR IUD 1 each by Intrauterine route once.     lisinopril -hydrochlorothiazide  (ZESTORETIC ) 20-25 MG tablet Take 1 tablet by mouth daily. 90 tablet 3   LORazepam  (ATIVAN ) 1 MG tablet 1/2 to 1 tab po daily PRN anxiety 15 tablet 0   MAGNESIUM  PO Take 500 mg by mouth.     Melatonin 3 MG TABS Take 1 tablet by mouth at bedtime.     meloxicam  (MOBIC ) 15 MG tablet Take 1 tablet (15 mg total) by mouth daily as needed for pain. 90 tablet 6   montelukast  (SINGULAIR ) 10 MG tablet Take 1 tablet (10 mg total) by mouth at bedtime. 90 tablet 3   Multiple Vitamin (MULTIVITAMIN) tablet Take 1 tablet by mouth daily.     mupirocin  cream (BACTROBAN ) 2 % Apply 1 application topically as needed. 15 g 1   Omega-3 Fatty Acids (FISH OIL) 1000 MG CAPS Take 1,000 mg by mouth daily.     omeprazole  (PRILOSEC ) 20 MG capsule Take 20 mg by mouth daily as needed.     POTASSIUM PO Take 99 mg by mouth.     Probiotic Product (PROBIOTIC PO) Take by mouth.     sodium chloride  (OCEAN) 0.65 % SOLN nasal spray Place 1 spray into both nostrils as needed for congestion. 15 mL 0   triamcinolone (NASACORT) 55 MCG/ACT AERO nasal inhaler Place 2 sprays into the nose daily.     Vibegron  (GEMTESA ) 75 MG TABS Take 1 tablet (75 mg total) by mouth daily. 90 tablet 3   Vilazodone  HCl (VIIBRYD ) 40 MG TABS Take 1 tablet (40 mg total) by mouth daily. 90 tablet 3   Vitamin D , Ergocalciferol , (DRISDOL ) 1.25 MG (50000 UT) CAPS capsule Take 1 capsule (50,000 Units total) by mouth every 7 (seven) days. 12 capsule 3   WEGOVY 1.7 MG/0.75ML SOAJ SQ injection INJECT CONTENTS OF 3/4 (THREE-FOURTHS ) ML  (1.7MG ) ONCE A WEEK     No current facility-administered medications for this visit.     ALLERGIES: Bupropion   Family History  Problem Relation Age of Onset  Osteoarthritis Mother    Hyperlipidemia Mother    Depression Mother    Alzheimer's disease Mother 50   Hypertension Mother    Sleep apnea Mother    Obesity Mother    Hyperlipidemia Father    Coronary artery disease Father    Hypertension Father    Stroke Father        multiple   Diabetes Father    Obesity Father    Sleep apnea Father    Liver cancer Maternal Grandmother        mets to breast   Breast cancer Maternal Grandmother        liver mets to breast   Cancer Maternal Grandmother        with mets   Colon cancer Neg Hx     Social History   Socioeconomic History   Marital status: Married    Spouse name: Denis Koppel   Number of children: Not on file   Years of education: Not on file   Highest education level: Not on file  Occupational History   Not on file  Tobacco Use   Smoking status: Former    Current packs/day: 0.00    Average packs/day: 0.5 packs/day for 6.0 years (3.0 ttl pk-yrs)    Types: Cigarettes    Start date: 03/02/1983    Quit date: 03/01/1989    Years since quitting: 34.9   Smokeless tobacco: Never   Tobacco comments:    Quit in 1991- started in 1983 1/2ppd  Vaping Use   Vaping status: Never Used  Substance and Sexual Activity   Alcohol use: Yes    Alcohol/week: 0.0 standard drinks of alcohol    Comment: 2 drinks/month   Drug use: No   Sexual activity: Not Currently    Partners: Male    Birth control/protection: Other-see comments, I.U.D.    Comment: vasectomy/Mirena  IUD inserted 03-04-16  Other Topics Concern   Not on file  Social History Narrative   Not on file   Social Drivers of Health   Financial Resource Strain: Low Risk  (07/05/2022)   Received from [facility]   Overall Financial Resource Strain (CARDIA)    Difficulty of Paying Living Expenses: Not very hard  Food  Insecurity: Low Risk  (03/23/2023)   Received from [facility]   Hunger Vital Sign    Within the past 12 months, you worried that your food would run out before you got money to buy more: Never true    Within the past 12 months, the food you bought just didn't last and you didn't have money to get more. : Never true  Transportation Needs: No Transportation Needs (03/23/2023)   Received from [facility]    In the past 12 months, has lack of reliable transportation kept you from medical appointments, meetings, work or from getting things needed for daily living? : No  Physical Activity: Unknown (07/05/2022)   Received from [facility]   Exercise Vital Sign    On average, how many days per week do you engage in moderate to strenuous exercise (like a brisk walk)?: 0 days    Minutes of Exercise per Session: Not on file  Stress: Stress Concern Present (07/05/2022)   Received from [facility] - Occupational Stress Questionnaire    Feeling of Stress : To some extent  Social Connections: Somewhat Isolated (07/05/2022)   Received from [facility]   Social Network    How  would you rate your social network (family, work, friends)?: Restricted participation with some degree of social isolation  Intimate Partner Violence: Not At Risk (01/29/2023)   Received from [facility]   HITS    Over the last 12 months how often did your partner physically hurt you?: Never    Over the last 12 months how often did your partner insult you or talk down to you?: Never    Over the last 12 months how often did your partner threaten you with physical harm?: Never    Over the last 12 months how often did your partner scream or curse at you?: Never    Review of Systems  All other systems reviewed and are negative.   PHYSICAL EXAMINATION:   BP 124/80 (BP Location: Left Arm, Patient Position: Sitting)   Pulse 81   SpO2 97%     General appearance:  alert, cooperative and appears stated age  ASSESSMENT:  Postmenopausal bleeding.  Mirena  IUD expiring.  Hx hysteroscopy with dilation and curettage x 2.  Has unilateral Essure in left tube.  PLAN:  We discussed her tendency to form polyps and treatment options of hysteroscopy with dilation and curettage versus hysterectomy with bilateral salpingo-oophorectomy.  Procedures and recovery explained.  Written information given.   Patient will consider her options.   Will make referral for surgical consultation.  Patient will need surgery done on a Friday.    35 min  total time was spent for this patient encounter, including preparation, face-to-face counseling with the patient, coordination of care, and documentation of the encounter.

## 2024-02-16 ENCOUNTER — Ambulatory Visit: Admitting: Obstetrics and Gynecology

## 2024-03-21 ENCOUNTER — Ambulatory Visit: Admitting: Obstetrics and Gynecology

## 2024-03-21 ENCOUNTER — Encounter: Payer: Self-pay | Admitting: Obstetrics and Gynecology

## 2024-03-21 VITALS — BP 118/82 | HR 91 | Ht 62.75 in | Wt 203.0 lb

## 2024-03-21 DIAGNOSIS — Z975 Presence of (intrauterine) contraceptive device: Secondary | ICD-10-CM

## 2024-03-21 DIAGNOSIS — N950 Postmenopausal bleeding: Secondary | ICD-10-CM

## 2024-03-21 DIAGNOSIS — Z9189 Other specified personal risk factors, not elsewhere classified: Secondary | ICD-10-CM | POA: Diagnosis not present

## 2024-03-21 DIAGNOSIS — N83209 Unspecified ovarian cyst, unspecified side: Secondary | ICD-10-CM | POA: Diagnosis not present

## 2024-03-21 DIAGNOSIS — Z01818 Encounter for other preprocedural examination: Secondary | ICD-10-CM | POA: Diagnosis not present

## 2024-03-21 DIAGNOSIS — D219 Benign neoplasm of connective and other soft tissue, unspecified: Secondary | ICD-10-CM | POA: Diagnosis not present

## 2024-03-21 DIAGNOSIS — N840 Polyp of corpus uteri: Secondary | ICD-10-CM | POA: Diagnosis not present

## 2024-03-21 NOTE — Progress Notes (Signed)
 "  GYNECOLOGY  VISIT   HPI: 64 y.o.   Married  Caucasian female  presents for surgical consult from Dr. Nikki G0P0 with No LMP recorded. (Menstrual status: IUD).   here for: Discuss treatment options Has considered hysteroscopy vs. [facility]H.   She would like RLH. Her husband had a prior marriage and states the hysterectomy changed her and he is not supportive of the hysterectomy with her.  Per Dr. Nikki: Has Mirena  IUD, postmenopausal bleeding, ultrasound indicating possible polyps, and endometrial biopsy showing pseudocecidualized stroma and fragments suggestive of endometrial polyp.  No hyperplasia or cancer seen.  Her IUD is expiring in January, 2026.    Has a hx of endometrial polyp formation.   Status post hysteroscopy with dilation and curettage x 2, Sept 2014 and May 2016.  Mirena  IUD was placed to control abnormal perimenopausal bleeding.    Stress at home.  Dog died.    GYNECOLOGIC HISTORY: No LMP recorded. (Menstrual status: IUD). Contraception:  vasectomy, Mirena  03/04/16 Menopausal hormone therapy:  n/a Last 2 paps:  01/12/24 neg, HR HPV neg, 12/23/20 neg, HR HPV neg  History of abnormal Pap or positive HPV:  no Mammogram:  01/06/24 Breast Density Cat B, BIRADS Cat 1 neg         OB History     Gravida  0   Para      Term      Preterm      AB      Living         SAB      IAB      Ectopic      Multiple      Live Births                 Patient Active Problem List   Diagnosis Date Noted   Non-seasonal allergic rhinitis 03/04/2022   Recurrent major depressive disorder, in full remission 08/07/2021   GAD (generalized anxiety disorder) 01/05/2021   Abnormal uterine bleeding 01/05/2021   Osteoarthritis    Chronic neck pain 11/23/2016   Senile osteoporosis 04/02/2015   Gastroesophageal reflux disease 03/25/2015   BMI 40.0-44.9, adult (HCC) 02/12/2012   Vitamin D  insufficiency 12/15/2010   h/o OSA (obstructive sleep apnea) 08/11/2010   Mixed  hyperlipidemia 07/28/2010   Essential hypertension 07/28/2010    Past Medical History:  Diagnosis Date   Anxiety    NO MEDS   Arthritis    Back pain    Bursitis    Cervical cancer screening 07/20/2012   Menarche at 13, regular til recently No h/o abnormal paps, last pap 2-3 years ago G0P0  MGM today, no h/o abnormal MGMs Spotting now monthly and scant H/o uterine polyps   Condyloma acuminata 2020   Depression    counseling in 2010 for 2 months on medication   Fibrocystic breast 1988   Fibroids 2014   Fracture of right fibula 05/19/2015   October 2016   GERD (gastroesophageal reflux disease) 2005   treated with omeprazole    High cholesterol 2002   History of sinusitis    once/twice per year   Hyperglycemia 05/19/2015   Hypertension 2002   Joint pain    MRSA (methicillin resistant staph aureus) culture positive 2007   Muscular pain    Obesity    OSA (obstructive sleep apnea)    +sleep study 08/2010., DOES NOT USE CPAP, USES MOSES MOUTH PIECE   Osteoarthritis    KNEES, HANDS, HIPS   Osteoporosis  Plantar fasciitis of left foot 03/2014   Seasonal allergies    affected in the spring   Sleep apnea    mouthpiece Moses   Uterine polyp    Uterine polyp 05/19/2015   Recurrent Managed by Dr Lyle Cary   Vitamin D  deficiency    Wrist fracture 07/2012   right    Past Surgical History:  Procedure Laterality Date   DILATATION & CURETTAGE/HYSTEROSCOPY WITH MYOSURE N/A 07/30/2014   Procedure: DILATATION & CURETTAGE/HYSTEROSCOPY WITH MYOSURE;  Surgeon: Bobie FORBES Cathlyn JAYSON Cary, MD;  Location: [facility];  Service: Gynecology;  Laterality: N/A;  extra time for BMI 49.   DILATATION & CURRETTAGE/HYSTEROSCOPY WITH RESECTOCOPE N/A 11/24/2012   Procedure: DILATATION & CURETTAGE/HYSTEROSCOPY WITH RESECTOCOPE;  Surgeon: Bobie DELENA Cary, MD;  Location: [facility];  Service: Gynecology;  Laterality: N/A;   ESSURE TUBAL LIGATION Left    TONSILLECTOMY  1969   WISDOM TOOTH EXTRACTION  1979    WRIST FRACTURE SURGERY Right 07/2012    Current Outpatient Medications  Medication Sig Dispense Refill   busPIRone  (BUSPAR ) 7.5 MG tablet Take 1 tablet (7.5 mg total) by mouth 2 (two) times daily. 180 tablet 3   CALCIUM  PO Take 1 tablet by mouth 2 (two) times daily.      Coenzyme Q10 (CO Q 10 PO) Take by mouth daily.     cyclobenzaprine  (FLEXERIL ) 10 MG tablet Take 1 tablet (10 mg total) by mouth 2 (two) times daily. TAKE ONE TABLET BY MOUTH ONE TIME DAILY AS NEEDED 180 tablet 1   gabapentin  (NEURONTIN ) 300 MG capsule TAKE 1 CAPSULE BY MOUTH EVERY DAY AT BEDTIME 30 capsule 3   levonorgestrel  (MIRENA ) 20 MCG/24HR IUD 1 each by Intrauterine route once.     lisinopril -hydrochlorothiazide  (ZESTORETIC ) 20-25 MG tablet Take 1 tablet by mouth daily. 90 tablet 3   LORazepam  (ATIVAN ) 1 MG tablet 1/2 to 1 tab po daily PRN anxiety 15 tablet 0   MAGNESIUM  PO Take 500 mg by mouth.     Melatonin 3 MG TABS Take 1 tablet by mouth at bedtime.     meloxicam  (MOBIC ) 15 MG tablet Take 1 tablet (15 mg total) by mouth daily as needed for pain. 90 tablet 6   montelukast  (SINGULAIR ) 10 MG tablet Take 1 tablet (10 mg total) by mouth at bedtime. 90 tablet 3   Multiple Vitamin (MULTIVITAMIN) tablet Take 1 tablet by mouth daily.     mupirocin  cream (BACTROBAN ) 2 % Apply 1 application topically as needed. 15 g 1   Omega-3 Fatty Acids (FISH OIL) 1000 MG CAPS Take 1,000 mg by mouth daily.     omeprazole  (PRILOSEC ) 20 MG capsule Take 20 mg by mouth daily as needed.     POTASSIUM PO Take 99 mg by mouth.     Probiotic Product (PROBIOTIC PO) Take by mouth.     sodium chloride  (OCEAN) 0.65 % SOLN nasal spray Place 1 spray into both nostrils as needed for congestion. 15 mL 0   triamcinolone (NASACORT) 55 MCG/ACT AERO nasal inhaler Place 2 sprays into the nose daily.     Vibegron  (GEMTESA ) 75 MG TABS Take 1 tablet (75 mg total) by mouth daily. 90 tablet 3   Vilazodone  HCl (VIIBRYD ) 40 MG TABS Take 1 tablet (40 mg total) by mouth  daily. 90 tablet 3   Vitamin D , Ergocalciferol , (DRISDOL ) 1.25 MG (50000 UT) CAPS capsule Take 1 capsule (50,000 Units total) by mouth every 7 (seven) days. 12 capsule 3   WEGOVY 1.7  MG/0.75ML SOAJ SQ injection INJECT CONTENTS OF 3/4 (THREE-FOURTHS ) ML (1.7MG ) ONCE A WEEK     No current facility-administered medications for this visit.     ALLERGIES: Bupropion   Family History  Problem Relation Age of Onset   Osteoarthritis Mother    Hyperlipidemia Mother    Depression Mother    Alzheimer's disease Mother 71   Hypertension Mother    Sleep apnea Mother    Obesity Mother    Hyperlipidemia Father    Coronary artery disease Father    Hypertension Father    Stroke Father        multiple   Diabetes Father    Obesity Father    Sleep apnea Father    Liver cancer Maternal Grandmother        mets to breast   Breast cancer Maternal Grandmother        liver mets to breast   Cancer Maternal Grandmother        with mets   Colon cancer Neg Hx     Social History   Socioeconomic History   Marital status: Married    Spouse name: Tonna Palazzi   Number of children: Not on file   Years of education: Not on file   Highest education level: Not on file  Occupational History   Not on file  Tobacco Use   Smoking status: Former    Current packs/day: 0.00    Average packs/day: 0.5 packs/day for 6.0 years (3.0 ttl pk-yrs)    Types: Cigarettes    Start date: 03/02/1983    Quit date: 03/01/1989    Years since quitting: 35.0   Smokeless tobacco: Never   Tobacco comments:    Quit in 1991- started in 1983 1/2ppd  Vaping Use   Vaping status: Never Used  Substance and Sexual Activity   Alcohol use: Yes    Alcohol/week: 0.0 standard drinks of alcohol    Comment: 2 drinks/month   Drug use: No   Sexual activity: Not Currently    Partners: Male    Birth control/protection: Other-see comments, I.U.D.    Comment: vasectomy/Mirena  IUD inserted 03-04-16  Other Topics Concern   Not on file  Social  History Narrative   Not on file   Social Drivers of Health   Tobacco Use: Medium Risk (02/21/2024)   Received from [facility]   Patient History    Smoking Tobacco Use: Former    Smokeless Tobacco Use: Never    Passive Exposure: Not on file  Financial Resource Strain: Low Risk (07/05/2022)   Received from [facility]   Overall Financial Resource Strain (CARDIA)    Difficulty of Paying Living Expenses: Not very hard  Food Insecurity: Low Risk (03/19/2024)   Received from [facility]   Epic    Within the past 12 months, you worried that your food would run out before you got money to buy more: Never true    Within the past 12 months, the food you bought just didn't last and you didn't have money to get more. : Never true  Transportation Needs: No Transportation Needs (03/19/2024)   Received from [facility]    In the past 12 months, has lack of reliable transportation kept you from medical appointments, meetings, work or from getting things needed for daily living? : No  Physical Activity: Unknown (07/05/2022)   Received from [facility]   Exercise Vital Sign    On average, how many days per week do  you engage in moderate to strenuous exercise (like a brisk walk)?: 0 days    Minutes of Exercise per Session: Not on file  Stress: Stress Concern Present (07/05/2022)   Received from [facility] - Occupational Stress Questionnaire    Feeling of Stress : To some extent  Social Connections: Somewhat Isolated (07/05/2022)   Received from [facility]   Social Network    How would you rate your social network (family, work, friends)?: Restricted participation with some degree of social isolation  Intimate Partner Violence: Not At Risk (01/29/2023)   Received from [facility]   HITS    Over the last 12 months how often did your partner physically hurt you?: Never    Over the last 12 months how often did your partner insult  you or talk down to you?: Never    Over the last 12 months how often did your partner threaten you with physical harm?: Never    Over the last 12 months how often did your partner scream or curse at you?: Never  Depression (PHQ2-9): Low Risk (05/10/2023)   Depression (PHQ2-9)    PHQ-2 Score: 0  Alcohol Screen: Not on file  Housing: Low Risk (03/19/2024)   Received from [facility]   Epic    What is your living situation today?: I have a steady place to live    Think about the place you live. Do you have problems with any of the following? Choose all that apply:: None/None on this list  Utilities: Low Risk (03/19/2024)   Received from [facility]   Utilities    In the past 12 months has the electric, gas, oil, or water company threatened to shut off services in your home? : No  Health Literacy: Not on file    Review of Systems  All other systems reviewed and are negative.   PHYSICAL EXAMINATION:   There were no vitals taken for this visit.    General appearance: alert, cooperative and appears stated age  .Cervical Cancer Screening - Results and Follow-ups Selected result Result date Tests and Procedures Follow-ups Provider Responsible for Follow-ups  01/12/2024  Surgical pathology( [place])  SURGICAL PATHOLOGY: SURGICAL PATHOLOGY CASE: [id number] PATIENT: RICKA MEDLEY Surgical Pathology Report     Clinical History: PMP bleeding, Mirena  IUD (crm)     FINAL MICROSCOPIC DIAGNOSIS:  A. ENDOMETRIUM, BIOPSY: - Few small fragments of atrophic endometr...    Cervical Cancer Screening History Report Surgical pathology( [place]/ [facility]) Order: [id number]  Status: Edited Result - FINAL     Next appt: 05/10/2024 at 08:00 AM in Gynecology Luetta DELENA Cary, MD)     Dx: Postmenopausal bleeding; Presence of ...   Test Result Released: Yes (seen)     Messages: Seen   2 Result Notes     1 Patient Communication     View Follow-Up Encounter     Component Ref Range & Units (hover) 2 mo ago  SURGICAL PATHOLOGY SURGICAL PATHOLOGY CASE: [id number] PATIENT: RICKA MEDLEY Surgical Pathology Report     Clinical History: PMP bleeding, Mirena  IUD (crm)     FINAL MICROSCOPIC DIAGNOSIS:  A. ENDOMETRIUM, BIOPSY: - Few small fragments of atrophic endometrial glandular tissue. - Few small fragments of endometrium with pseudo decidualized stroma. - Fragments suggestive of endometria polyp. - Negative for atypia/EIN and malignancy. - Deeper sections were examined.  GROSS DESCRIPTION:  The specimen is received in formalin, and consists of a 2.3 x  2.0 x 0.4 cm aggregate of red-brown clotted blood and mucus.  The specimen is entirely submitted in 1 cassette.  SHIRLEEN 01/13/2024)  Final Diagnosis performed by Rexene Daily, MD.   Electronically signed 01/17/2024 Technical component performed at [facility]. [facility], [address] [place], [place] [address].  Professional component performed at [facility]. [address] [place], [place] [address]  Immunohistochemistry Technical component (if applicable) was performed at [employer]. [address], [address], [place], [place] [address].  IMMUNOHISTOCHEMISTRY DISCLAIMER (if applicable): Some of these immunohistochemical stains may have been developed and the performance characteristics determine by [facility]. Some may not have been cleared or approved by the U.S. Food and Drug Administration. The FDA has determined that such clearance or approval is not necessary. This test is used for clinical purposes. It should not be regarded as investigational or for research. This laboratory is certified under the Clinical Laboratory Improvement Amendments of 1988 (CLIA-88) as qualified to perform high complexity clinical laboratory testing.  The controls stained appropriately.     ASSESSMENT:  Postmenopausal  bleeding.  Mirena  IUD expiring.  Hx hysteroscopy with dilation and curettage x 2.  Has unilateral Essure in left tube. Simple ovarian cyst 2.01cm Fibroids   PLAN:  Reviewed in detail options with hysteroscopy vs. [facility]H.  Discussed with PMB and risk for polyps being cancerous and since this is the 3rd time they have returned that there is a high likelihood that they would return and there may be need for future procedures. Information printed on the [facility] and on loss of hormone function in menopause. Prior partner with likely early surgical menopause and with her case, she is postmenopausal and the hormones have already depleted naturally. Discussed ongoing concern for cancer in the future with a myosure hysteroscopy.  The [facility] procedure and what to expect after was discussed in detail. She would like to place the surgery request and have one more discussion with her husband on the [facility].  She will keep us  posted.  35 min  total time was spent for this patient encounter, including preparation, face-to-face counseling with the patient, coordination of care, and documentation of the encounter.    "

## 2024-03-22 ENCOUNTER — Encounter: Payer: Self-pay | Admitting: Obstetrics and Gynecology

## 2024-03-22 ENCOUNTER — Other Ambulatory Visit: Payer: Self-pay | Admitting: Obstetrics and Gynecology

## 2024-03-22 DIAGNOSIS — N950 Postmenopausal bleeding: Secondary | ICD-10-CM

## 2024-03-22 DIAGNOSIS — N83209 Unspecified ovarian cyst, unspecified side: Secondary | ICD-10-CM

## 2024-03-22 DIAGNOSIS — N840 Polyp of corpus uteri: Secondary | ICD-10-CM

## 2024-03-22 DIAGNOSIS — D219 Benign neoplasm of connective and other soft tissue, unspecified: Secondary | ICD-10-CM

## 2024-03-22 DIAGNOSIS — Z975 Presence of (intrauterine) contraceptive device: Secondary | ICD-10-CM

## 2024-03-22 NOTE — Telephone Encounter (Signed)
Reviewed. Encounter closed.

## 2024-04-06 ENCOUNTER — Telehealth: Payer: Self-pay

## 2024-04-06 NOTE — Telephone Encounter (Signed)
 Pt called in asking for information on who to call and where to go to have the CT Cardiac scoring test done.   I advised pt the it was ordered for [facility] and she can call to schedule at [phone]

## 2024-04-24 ENCOUNTER — Other Ambulatory Visit (HOSPITAL_COMMUNITY)

## 2024-05-10 ENCOUNTER — Ambulatory Visit: Admitting: Obstetrics and Gynecology

## 2024-06-22 ENCOUNTER — Encounter: Admitting: Obstetrics and Gynecology

## 2024-06-29 ENCOUNTER — Ambulatory Visit (HOSPITAL_COMMUNITY): Admit: 2024-06-29 | Admitting: Obstetrics and Gynecology
